# Patient Record
Sex: Female | Born: 1940 | Race: Black or African American | Hispanic: No | Smoking: Former smoker
Health system: Southern US, Community
[De-identification: ages and names within clinical notes are randomized; demographics above are authoritative.]

## PROBLEM LIST (undated history)

## (undated) DIAGNOSIS — M1990 Unspecified osteoarthritis, unspecified site: Secondary | ICD-10-CM

## (undated) DIAGNOSIS — I10 Essential (primary) hypertension: Secondary | ICD-10-CM

## (undated) DIAGNOSIS — K219 Gastro-esophageal reflux disease without esophagitis: Secondary | ICD-10-CM

## (undated) DIAGNOSIS — E079 Disorder of thyroid, unspecified: Secondary | ICD-10-CM

## (undated) DIAGNOSIS — I491 Atrial premature depolarization: Secondary | ICD-10-CM

## (undated) DIAGNOSIS — G4730 Sleep apnea, unspecified: Secondary | ICD-10-CM

## (undated) DIAGNOSIS — T4145XA Adverse effect of unspecified anesthetic, initial encounter: Secondary | ICD-10-CM

## (undated) DIAGNOSIS — T8859XA Other complications of anesthesia, initial encounter: Secondary | ICD-10-CM

## (undated) DIAGNOSIS — I209 Angina pectoris, unspecified: Secondary | ICD-10-CM

## (undated) DIAGNOSIS — M5416 Radiculopathy, lumbar region: Secondary | ICD-10-CM

## (undated) DIAGNOSIS — I493 Ventricular premature depolarization: Secondary | ICD-10-CM

## (undated) DIAGNOSIS — M48061 Spinal stenosis, lumbar region without neurogenic claudication: Secondary | ICD-10-CM

## (undated) DIAGNOSIS — E039 Hypothyroidism, unspecified: Secondary | ICD-10-CM

## (undated) DIAGNOSIS — M4800 Spinal stenosis, site unspecified: Secondary | ICD-10-CM

## (undated) HISTORY — PX: BACK SURGERY: SHX140

## (undated) HISTORY — PX: CHOLECYSTECTOMY: SHX55

## (undated) HISTORY — PX: JOINT REPLACEMENT: SHX530

## (undated) HISTORY — PX: ABDOMINAL HYSTERECTOMY: SHX81

## (undated) SURGERY — Surgical Case
Anesthesia: *Unknown

---

## 1995-04-29 DIAGNOSIS — N859 Noninflammatory disorder of uterus, unspecified: Secondary | ICD-10-CM | POA: Insufficient documentation

## 2004-08-23 ENCOUNTER — Ambulatory Visit: Payer: Self-pay

## 2005-07-05 ENCOUNTER — Ambulatory Visit: Payer: Self-pay | Admitting: Gastroenterology

## 2005-07-18 ENCOUNTER — Ambulatory Visit: Payer: Self-pay

## 2006-06-21 ENCOUNTER — Emergency Department: Payer: Self-pay | Admitting: Emergency Medicine

## 2006-08-10 ENCOUNTER — Emergency Department: Payer: Self-pay | Admitting: Emergency Medicine

## 2006-08-10 ENCOUNTER — Other Ambulatory Visit: Payer: Self-pay

## 2006-08-21 ENCOUNTER — Ambulatory Visit: Payer: Self-pay | Admitting: Internal Medicine

## 2006-09-09 ENCOUNTER — Ambulatory Visit: Payer: Self-pay | Admitting: Internal Medicine

## 2007-03-20 ENCOUNTER — Ambulatory Visit: Payer: Self-pay | Admitting: Internal Medicine

## 2007-09-10 ENCOUNTER — Ambulatory Visit: Payer: Self-pay | Admitting: Internal Medicine

## 2007-11-03 ENCOUNTER — Ambulatory Visit: Payer: Self-pay | Admitting: Internal Medicine

## 2007-11-30 ENCOUNTER — Ambulatory Visit: Payer: Self-pay | Admitting: Family Medicine

## 2007-12-15 ENCOUNTER — Emergency Department: Payer: Self-pay | Admitting: Emergency Medicine

## 2008-11-07 ENCOUNTER — Ambulatory Visit: Payer: Self-pay | Admitting: Internal Medicine

## 2009-02-16 ENCOUNTER — Ambulatory Visit: Payer: Self-pay | Admitting: Internal Medicine

## 2009-07-17 ENCOUNTER — Ambulatory Visit: Payer: Self-pay | Admitting: Internal Medicine

## 2009-11-07 ENCOUNTER — Ambulatory Visit: Payer: Self-pay | Admitting: Internal Medicine

## 2009-11-08 ENCOUNTER — Ambulatory Visit: Payer: Self-pay | Admitting: Internal Medicine

## 2010-07-17 ENCOUNTER — Ambulatory Visit: Payer: Self-pay | Admitting: Gastroenterology

## 2010-07-25 ENCOUNTER — Ambulatory Visit: Payer: Self-pay | Admitting: Internal Medicine

## 2010-09-04 ENCOUNTER — Encounter: Payer: Self-pay | Admitting: Internal Medicine

## 2010-09-16 ENCOUNTER — Encounter: Payer: Self-pay | Admitting: Internal Medicine

## 2010-10-23 ENCOUNTER — Ambulatory Visit: Payer: Self-pay | Admitting: Gastroenterology

## 2010-11-19 ENCOUNTER — Ambulatory Visit: Payer: Self-pay | Admitting: Internal Medicine

## 2010-11-20 ENCOUNTER — Ambulatory Visit: Payer: Self-pay | Admitting: Gastroenterology

## 2010-11-22 ENCOUNTER — Ambulatory Visit: Payer: Self-pay | Admitting: Internal Medicine

## 2011-11-21 ENCOUNTER — Ambulatory Visit: Payer: Self-pay | Admitting: Internal Medicine

## 2012-08-24 ENCOUNTER — Ambulatory Visit: Payer: Self-pay | Admitting: Internal Medicine

## 2012-11-24 ENCOUNTER — Ambulatory Visit: Payer: Self-pay | Admitting: Internal Medicine

## 2013-01-08 ENCOUNTER — Emergency Department: Payer: Self-pay | Admitting: Emergency Medicine

## 2013-03-30 ENCOUNTER — Emergency Department: Payer: Self-pay | Admitting: Emergency Medicine

## 2013-03-30 LAB — BASIC METABOLIC PANEL
Anion Gap: 5 — ABNORMAL LOW (ref 7–16)
BUN: 17 mg/dL (ref 7–18)
Chloride: 107 mmol/L (ref 98–107)
Co2: 29 mmol/L (ref 21–32)
Creatinine: 0.77 mg/dL (ref 0.60–1.30)
EGFR (Non-African Amer.): >60
Glucose: 96 mg/dL (ref 65–99)
Osmolality: 283 (ref 275–301)
Potassium: 3.5 mmol/L (ref 3.5–5.1)
Sodium: 141 mmol/L (ref 136–145)

## 2013-03-30 LAB — CBC
HGB: 12.7 g/dL (ref 12.0–16.0)
MCH: 33 pg (ref 26.0–34.0)
MCHC: 34.2 g/dL (ref 32.0–36.0)
RBC: 3.85 10*6/uL (ref 3.80–5.20)

## 2013-03-30 LAB — TROPONIN I
Troponin-I: <0.02 ng/mL
Troponin-I: <0.02 ng/mL

## 2013-09-12 ENCOUNTER — Emergency Department: Payer: Self-pay | Admitting: Emergency Medicine

## 2013-09-12 LAB — CBC WITH DIFFERENTIAL/PLATELET
BASOS PCT: 1.3 %
Basophil #: 0.1 10*3/uL (ref 0.0–0.1)
Eosinophil #: 0.1 10*3/uL (ref 0.0–0.7)
Eosinophil %: 1.6 %
HCT: 37.7 % (ref 35.0–47.0)
HGB: 12.7 g/dL (ref 12.0–16.0)
Lymphocyte #: 2.6 10*3/uL (ref 1.0–3.6)
Lymphocyte %: 36.5 %
MCH: 33.2 pg (ref 26.0–34.0)
MCHC: 33.7 g/dL (ref 32.0–36.0)
MCV: 99 fL (ref 80–100)
MONO ABS: 0.4 x10 3/mm (ref 0.2–0.9)
Monocyte %: 6.4 %
NEUTROS ABS: 3.8 10*3/uL (ref 1.4–6.5)
Neutrophil %: 54.2 %
PLATELETS: 156 10*3/uL (ref 150–440)
RBC: 3.82 10*6/uL (ref 3.80–5.20)
RDW: 14.4 % (ref 11.5–14.5)
WBC: 7 10*3/uL (ref 3.6–11.0)

## 2013-09-12 LAB — BASIC METABOLIC PANEL
Anion Gap: 3 — ABNORMAL LOW (ref 7–16)
BUN: 20 mg/dL — ABNORMAL HIGH (ref 7–18)
CHLORIDE: 108 mmol/L — AB (ref 98–107)
Calcium, Total: 8.6 mg/dL (ref 8.5–10.1)
Co2: 28 mmol/L (ref 21–32)
Creatinine: 0.7 mg/dL (ref 0.60–1.30)
EGFR (African American): >60
EGFR (Non-African Amer.): >60
Glucose: 93 mg/dL (ref 65–99)
Osmolality: 280 (ref 275–301)
Potassium: 3.8 mmol/L (ref 3.5–5.1)
Sodium: 139 mmol/L (ref 136–145)

## 2013-09-12 LAB — TROPONIN I

## 2013-09-12 LAB — URINALYSIS, COMPLETE
BACTERIA: NONE SEEN
BLOOD: NEGATIVE
Bilirubin,UR: NEGATIVE
Glucose,UR: NEGATIVE mg/dL (ref 0–75)
Ketone: NEGATIVE
Nitrite: NEGATIVE
Ph: 8 (ref 4.5–8.0)
Protein: NEGATIVE
RBC,UR: 1 /HPF (ref 0–5)
Specific Gravity: 1.008 (ref 1.003–1.030)
Squamous Epithelial: 1
WBC UR: <1 /HPF (ref 0–5)

## 2013-10-20 ENCOUNTER — Emergency Department: Payer: Self-pay | Admitting: Emergency Medicine

## 2013-10-20 LAB — URINALYSIS, COMPLETE
BILIRUBIN, UR: NEGATIVE
Bacteria: NONE SEEN
Blood: NEGATIVE
Glucose,UR: NEGATIVE mg/dL (ref 0–75)
Ketone: NEGATIVE
LEUKOCYTE ESTERASE: NEGATIVE
Nitrite: NEGATIVE
Ph: 8 (ref 4.5–8.0)
Protein: NEGATIVE
SQUAMOUS EPITHELIAL: NONE SEEN
Specific Gravity: 1.015 (ref 1.003–1.030)
WBC UR: 1 /HPF (ref 0–5)

## 2013-10-20 LAB — CBC WITH DIFFERENTIAL/PLATELET
Basophil #: 0 10*3/uL (ref 0.0–0.1)
Basophil %: 0.8 %
EOS PCT: 2.1 %
Eosinophil #: 0.1 10*3/uL (ref 0.0–0.7)
HCT: 36.8 % (ref 35.0–47.0)
HGB: 12.2 g/dL (ref 12.0–16.0)
Lymphocyte #: 1.9 10*3/uL (ref 1.0–3.6)
Lymphocyte %: 33.1 %
MCH: 32.7 pg (ref 26.0–34.0)
MCHC: 33.2 g/dL (ref 32.0–36.0)
MCV: 98 fL (ref 80–100)
MONO ABS: 0.5 x10 3/mm (ref 0.2–0.9)
Monocyte %: 8.4 %
NEUTROS PCT: 55.6 %
Neutrophil #: 3.1 10*3/uL (ref 1.4–6.5)
Platelet: 148 10*3/uL — ABNORMAL LOW (ref 150–440)
RBC: 3.75 10*6/uL — ABNORMAL LOW (ref 3.80–5.20)
RDW: 14.2 % (ref 11.5–14.5)
WBC: 5.6 10*3/uL (ref 3.6–11.0)

## 2013-10-20 LAB — BASIC METABOLIC PANEL
Anion Gap: 3 — ABNORMAL LOW (ref 7–16)
BUN: 13 mg/dL (ref 7–18)
CO2: 30 mmol/L (ref 21–32)
Calcium, Total: 9 mg/dL (ref 8.5–10.1)
Chloride: 107 mmol/L (ref 98–107)
Creatinine: 0.69 mg/dL (ref 0.60–1.30)
Glucose: 97 mg/dL (ref 65–99)
Osmolality: 279 (ref 275–301)
POTASSIUM: 3.8 mmol/L (ref 3.5–5.1)
Sodium: 140 mmol/L (ref 136–145)

## 2013-10-20 LAB — TROPONIN I

## 2013-11-04 DIAGNOSIS — M1990 Unspecified osteoarthritis, unspecified site: Secondary | ICD-10-CM | POA: Insufficient documentation

## 2014-01-03 ENCOUNTER — Ambulatory Visit: Payer: Self-pay | Admitting: Internal Medicine

## 2014-01-15 ENCOUNTER — Emergency Department: Payer: Self-pay | Admitting: Emergency Medicine

## 2014-01-18 ENCOUNTER — Emergency Department: Payer: Self-pay | Admitting: Emergency Medicine

## 2014-01-19 LAB — CBC
HCT: 38.9 % (ref 35.0–47.0)
HGB: 12.9 g/dL (ref 12.0–16.0)
MCH: 32.6 pg (ref 26.0–34.0)
MCHC: 33.1 g/dL (ref 32.0–36.0)
MCV: 98 fL (ref 80–100)
PLATELETS: 143 10*3/uL — AB (ref 150–440)
RBC: 3.95 10*6/uL (ref 3.80–5.20)
RDW: 13.9 % (ref 11.5–14.5)
WBC: 6.4 10*3/uL (ref 3.6–11.0)

## 2014-01-19 LAB — COMPREHENSIVE METABOLIC PANEL
ALBUMIN: 3.6 g/dL (ref 3.4–5.0)
ALK PHOS: 62 U/L
ALT: 25 U/L
Anion Gap: 8 (ref 7–16)
BILIRUBIN TOTAL: 0.3 mg/dL (ref 0.2–1.0)
BUN: 12 mg/dL (ref 7–18)
CREATININE: 0.61 mg/dL (ref 0.60–1.30)
Calcium, Total: 8.6 mg/dL (ref 8.5–10.1)
Chloride: 104 mmol/L (ref 98–107)
Co2: 29 mmol/L (ref 21–32)
Glucose: 116 mg/dL — ABNORMAL HIGH (ref 65–99)
OSMOLALITY: 282 (ref 275–301)
POTASSIUM: 3.4 mmol/L — AB (ref 3.5–5.1)
SGOT(AST): 16 U/L (ref 15–37)
Sodium: 141 mmol/L (ref 136–145)
Total Protein: 7.3 g/dL (ref 6.4–8.2)

## 2014-01-19 LAB — URINALYSIS, COMPLETE
BLOOD: NEGATIVE
Bacteria: NONE SEEN
Bilirubin,UR: NEGATIVE
Glucose,UR: NEGATIVE mg/dL (ref 0–75)
Ketone: NEGATIVE
Leukocyte Esterase: NEGATIVE
NITRITE: NEGATIVE
Ph: 8 (ref 4.5–8.0)
Protein: NEGATIVE
RBC,UR: 1 /HPF (ref 0–5)
Specific Gravity: 1.01 (ref 1.003–1.030)
Squamous Epithelial: <1
WBC UR: 1 /HPF (ref 0–5)

## 2014-01-19 LAB — TROPONIN I: Troponin-I: <0.02 ng/mL

## 2014-01-29 ENCOUNTER — Observation Stay: Payer: Self-pay | Admitting: Internal Medicine

## 2014-01-29 LAB — CBC
HCT: 39.5 % (ref 35.0–47.0)
HGB: 12.8 g/dL (ref 12.0–16.0)
MCH: 31.8 pg (ref 26.0–34.0)
MCHC: 32.4 g/dL (ref 32.0–36.0)
MCV: 98 fL (ref 80–100)
PLATELETS: 183 10*3/uL (ref 150–440)
RBC: 4.03 10*6/uL (ref 3.80–5.20)
RDW: 13.8 % (ref 11.5–14.5)
WBC: 8.6 10*3/uL (ref 3.6–11.0)

## 2014-01-29 LAB — BASIC METABOLIC PANEL
Anion Gap: 11 (ref 7–16)
BUN: 13 mg/dL (ref 7–18)
CO2: 27 mmol/L (ref 21–32)
CREATININE: 0.75 mg/dL (ref 0.60–1.30)
Calcium, Total: 8.8 mg/dL (ref 8.5–10.1)
Chloride: 106 mmol/L (ref 98–107)
EGFR (Non-African Amer.): >60
Glucose: 83 mg/dL (ref 65–99)
Osmolality: 286 (ref 275–301)
POTASSIUM: 3.2 mmol/L — AB (ref 3.5–5.1)
Sodium: 144 mmol/L (ref 136–145)

## 2014-01-29 LAB — URINALYSIS, COMPLETE
BLOOD: NEGATIVE
Bilirubin,UR: NEGATIVE
GLUCOSE, UR: NEGATIVE mg/dL (ref 0–75)
Nitrite: NEGATIVE
PH: 6 (ref 4.5–8.0)
Protein: NEGATIVE
RBC,UR: 15 /HPF (ref 0–5)
Specific Gravity: 1.023 (ref 1.003–1.030)
Squamous Epithelial: 7
WBC UR: 19 /HPF (ref 0–5)

## 2014-01-30 LAB — BASIC METABOLIC PANEL
Anion Gap: 9 (ref 7–16)
BUN: 22 mg/dL — ABNORMAL HIGH (ref 7–18)
Calcium, Total: 8.8 mg/dL (ref 8.5–10.1)
Chloride: 107 mmol/L (ref 98–107)
Co2: 26 mmol/L (ref 21–32)
Creatinine: 0.73 mg/dL (ref 0.60–1.30)
EGFR (African American): >60
EGFR (Non-African Amer.): >60
Glucose: 122 mg/dL — ABNORMAL HIGH (ref 65–99)
Osmolality: 288 (ref 275–301)
Potassium: 3.9 mmol/L (ref 3.5–5.1)
Sodium: 142 mmol/L (ref 136–145)

## 2014-01-30 LAB — CBC WITH DIFFERENTIAL/PLATELET
BASOS ABS: 0.1 10*3/uL (ref 0.0–0.1)
Basophil %: 0.8 %
Eosinophil #: 0 10*3/uL (ref 0.0–0.7)
Eosinophil %: 0.4 %
HCT: 39.7 % (ref 35.0–47.0)
HGB: 13.2 g/dL (ref 12.0–16.0)
Lymphocyte #: 1.3 10*3/uL (ref 1.0–3.6)
Lymphocyte %: 17.8 %
MCH: 32.8 pg (ref 26.0–34.0)
MCHC: 33.4 g/dL (ref 32.0–36.0)
MCV: 98 fL (ref 80–100)
MONO ABS: 0.3 x10 3/mm (ref 0.2–0.9)
Monocyte %: 4 %
Neutrophil #: 5.6 10*3/uL (ref 1.4–6.5)
Neutrophil %: 77 %
PLATELETS: 179 10*3/uL (ref 150–440)
RBC: 4.04 10*6/uL (ref 3.80–5.20)
RDW: 14 % (ref 11.5–14.5)
WBC: 7.2 10*3/uL (ref 3.6–11.0)

## 2014-01-31 LAB — BASIC METABOLIC PANEL
Anion Gap: 9 (ref 7–16)
BUN: 15 mg/dL (ref 7–18)
CALCIUM: 8.6 mg/dL (ref 8.5–10.1)
CHLORIDE: 104 mmol/L (ref 98–107)
CREATININE: 0.65 mg/dL (ref 0.60–1.30)
Co2: 26 mmol/L (ref 21–32)
EGFR (African American): >60
Glucose: 114 mg/dL — ABNORMAL HIGH (ref 65–99)
OSMOLALITY: 279 (ref 275–301)
Potassium: 3.9 mmol/L (ref 3.5–5.1)
Sodium: 139 mmol/L (ref 136–145)

## 2014-02-01 LAB — BASIC METABOLIC PANEL
ANION GAP: 9 (ref 7–16)
BUN: 15 mg/dL (ref 7–18)
CALCIUM: 9 mg/dL (ref 8.5–10.1)
Chloride: 101 mmol/L (ref 98–107)
Co2: 26 mmol/L (ref 21–32)
Creatinine: 0.71 mg/dL (ref 0.60–1.30)
EGFR (African American): >60
EGFR (Non-African Amer.): >60
Glucose: 120 mg/dL — ABNORMAL HIGH (ref 65–99)
Osmolality: 274 (ref 275–301)
Potassium: 3.6 mmol/L (ref 3.5–5.1)
SODIUM: 136 mmol/L (ref 136–145)

## 2014-02-01 LAB — CBC WITH DIFFERENTIAL/PLATELET
Basophil #: 0 10*3/uL (ref 0.0–0.1)
Basophil %: 0.3 %
Eosinophil #: 0 10*3/uL (ref 0.0–0.7)
Eosinophil %: 0.5 %
HCT: 42.3 % (ref 35.0–47.0)
HGB: 14 g/dL (ref 12.0–16.0)
Lymphocyte #: 1.8 10*3/uL (ref 1.0–3.6)
Lymphocyte %: 19.5 %
MCH: 32.5 pg (ref 26.0–34.0)
MCHC: 33.2 g/dL (ref 32.0–36.0)
MCV: 98 fL (ref 80–100)
MONOS PCT: 6.8 %
Monocyte #: 0.6 x10 3/mm (ref 0.2–0.9)
NEUTROS ABS: 6.6 10*3/uL — AB (ref 1.4–6.5)
NEUTROS PCT: 72.9 %
Platelet: 207 10*3/uL (ref 150–440)
RBC: 4.32 10*6/uL (ref 3.80–5.20)
RDW: 13.9 % (ref 11.5–14.5)
WBC: 9 10*3/uL (ref 3.6–11.0)

## 2014-02-02 LAB — CBC WITH DIFFERENTIAL/PLATELET
BASOS ABS: 0.1 10*3/uL (ref 0.0–0.1)
BASOS PCT: 0.7 %
Eosinophil #: 0.1 10*3/uL (ref 0.0–0.7)
Eosinophil %: 1.1 %
HCT: 40.9 % (ref 35.0–47.0)
HGB: 13.8 g/dL (ref 12.0–16.0)
Lymphocyte #: 2.2 10*3/uL (ref 1.0–3.6)
Lymphocyte %: 27.1 %
MCH: 32.8 pg (ref 26.0–34.0)
MCHC: 33.7 g/dL (ref 32.0–36.0)
MCV: 98 fL (ref 80–100)
Monocyte #: 0.7 x10 3/mm (ref 0.2–0.9)
Monocyte %: 8.3 %
NEUTROS PCT: 62.8 %
Neutrophil #: 5.2 10*3/uL (ref 1.4–6.5)
Platelet: 201 10*3/uL (ref 150–440)
RBC: 4.19 10*6/uL (ref 3.80–5.20)
RDW: 13.9 % (ref 11.5–14.5)
WBC: 8.3 10*3/uL (ref 3.6–11.0)

## 2014-02-02 LAB — BASIC METABOLIC PANEL
Anion Gap: 12 (ref 7–16)
BUN: 19 mg/dL — ABNORMAL HIGH (ref 7–18)
CO2: 28 mmol/L (ref 21–32)
Calcium, Total: 9 mg/dL (ref 8.5–10.1)
Chloride: 100 mmol/L (ref 98–107)
Creatinine: 0.86 mg/dL (ref 0.60–1.30)
EGFR (African American): >60
EGFR (Non-African Amer.): >60
Glucose: 103 mg/dL — ABNORMAL HIGH (ref 65–99)
Osmolality: 282 (ref 275–301)
Potassium: 3.3 mmol/L — ABNORMAL LOW (ref 3.5–5.1)
SODIUM: 140 mmol/L (ref 136–145)

## 2014-02-02 LAB — PATHOLOGY REPORT

## 2014-03-16 ENCOUNTER — Ambulatory Visit: Payer: Self-pay | Admitting: Family Medicine

## 2014-10-08 NOTE — Op Note (Signed)
PATIENT NAME:  Erika Crawford, Erika Crawford MR#:  [id number] DATE OF BIRTH:  15-May-1941  DATE OF PROCEDURE:  02/01/2014  PREOPERATIVE DIAGNOSIS: L2 compression fracture.   POSTOPERATIVE DIAGNOSIS: L2 compression fracture.   PROCEDURE: L2 compression fracture.   ANESTHESIA: MAC.   SURGEON: Hessie Knows, M.D.   DESCRIPTION OF PROCEDURE: The patient was brought to the Operating Room and after sedation was given, the patient was placed prone, and C-arm was brought in and good visualization of the L2 compression fracture was obtained in both AP and lateral projections. After patient identification and timeout procedures were completed, the area of the planned incisions on both right and left side were cleansed with alcohol and 5 mL of 1% Xylocaine infiltrated bilaterally. After this was completed, the back was prepped and draped in the usual sterile fashion and a repeat timeout procedure carried out. The right side was approached first through a small stab incision after first infiltrating down to the bone with a 50-50 mix of 1% Xylocaine and 0.5% Sensorcaine with epinephrine. A trocar was advanced to an extrapedicular approach and entered the vertebral body. A vertebral body biopsy was obtained of the L2 fracture. Next, the drill was carried out and this got to the midline and a balloon was inflated to approximately 4.5 mL. Following this, cement was mixed and inserted after the balloon was removed from the [facility] set. It was slowly used to fill the vertebral body getting good fill of the superior endplate, and there was a very good correction of the compression deformity of the superior endplate. Approximately 5.5 mL of bone cement was infiltrated without extravasation. The trocar was removed and postprocedure x-rays were obtained in both AP and lateral projections with the C-arm. The wound was closed with Dermabond and a Band-Aid applied. The patient was sent to the recovery room in stable condition.   ESTIMATED  BLOOD LOSS: Minimal.   COMPLICATIONS: None.   SPECIMEN: L2 vertebral body.   CONDITION: To recovery room, stable.    ____________________________ Laurene Footman, MD mjm:at D: 02/01/2014 20:48:25 ET T: 02/01/2014 21:56:49 ET JOB#: [id number]  cc: Laurene Footman, MD, <Dictator> Laurene Footman MD ELECTRONICALLY SIGNED 02/02/2014 9:57

## 2014-10-08 NOTE — H&P (Signed)
PATIENT NAME:  Erika Crawford, Erika Crawford MR#:  [id number] DATE OF BIRTH:  03/24/1941  DATE OF ADMISSION:  01/29/2014  PRIMARY CARE PROVIDER: Ellamae Sia, MD.   EMERGENCY DEPARTMENT REFERRING DOCTOR: Yetta Numbers. Karma Greaser, MD.   CHIEF COMPLAINT: Fall, back pain.   HISTORY OF PRESENT ILLNESS: The patient is a 74 year old African American female with history of hypothyroidism, hypertension, GERD, who has some mild chronic back pain who reports that she has been feeling dizzy for the past few months. Last week she felt dizzy and may have "passed out" and fell on her back. Since then, she has had significant back pain and has had difficulty with walking. The patient comes to the ED here and was evaluated with x-ray of her lumbar spine which shows a moderate compression fracture of the L2. The patient otherwise denies any fevers, chills. No chest pains. No palpitations. Complains of occasional dyspnea on exertion. Denies any urinary frequency, urgency, or hesitancy.   PAST MEDICAL HISTORY: Significant for:  1.  GERD.  2.  Hypothyroidism.  3.  Hypertension.   PAST SURGICAL HISTORY: Status post cholecystectomy, hysterectomy.   ALLERGIES: None.   CURRENT MEDICATIONS: She is on valsartan 160 daily, Tylenol 1000 mg t.i.d. p.r.n., Nexium 40 daily, Nasonex 50 mcg 2-3 sprays each nostril daily as needed, meloxicam  7.5 mg 1 tab p.o. b.i.d., levothyroxine 100 mcg daily, Flexeril t.i.d. as needed.   SOCIAL HISTORY: Does not smoke. Drinks occasionally and lives by herself.   FAMILY HISTORY: Positive for hypertension.   REVIEW OF SYSTEMS:  CONSTITUTIONAL: Denies any fevers. Complains of back pain. No weight loss. No weight gain.  EYES: No blurred or double vision. No pain. No redness. No inflammation. Has glaucoma and cataracts.  ENT: No tinnitus. No ear pain. No hearing loss. Has seasonal allergies. No epistaxis. No nasal discharge. No difficulty swallowing.   RESPIRATORY: Denies any cough, wheezing,  hemoptysis. No COPD, no TB.  CARDIOVASCULAR: Denies any chest pain, orthopnea, edema, or arrhythmia. No palpitations. No syncope.  GASTROINTESTINAL: No nausea, vomiting, diarrhea. No abdominal pain. No hematemesis. No melena. Has GERD.  GENITOURINARY: Denies any dysuria, hematuria, renal calculus, or frequency.  ENDOCRINE: Denies any polyuria or nocturia, has hypothyroidism.  HEMATOLOGY AND LYMPHATICS: Denies anemia, easy bruisability, or bleeding.  SKIN: No acne. No rash.  MUSCULOSKELETAL: Complains of back pain.  NEUROLOGIC: No numbness, CVA, TIA, or seizures.  PSYCHIATRIC: No anxiety, insomnia, or ADD.   PHYSICAL EXAMINATION: VITAL SIGNS: Temperature 97.1, pulse 86, respirations 18, blood pressure 170/78.  GENERAL: The patient is an obese female, currently not in any distress.  HEENT: Head atraumatic, normocephalic. Pupils equally round, reactive to light and accommodation. There is no conjunctival pallor. No scleral icterus. Extraocular movements intact. Nasal exam shows no nasal lesions or drainage. Ears, there are no external lesions or drainage. Mouth is moist without any exudate.  NECK: Supple and symmetric. No masses. Thyroid midline. Not enlarged. No JVD.  RESPIRATORY: Good respiratory effort. Clear to auscultation bilaterally without any rales, rhonchi, wheezing.  CARDIOVASCULAR: Regular rate and rhythm. No murmurs, rubs, clicks, or gallops. PMI is not displaced.  ABDOMEN: Soft, nontender, nondistended. No hepatosplenomegaly.  GENITOURINARY: Deferred.  MUSCULOSKELETAL: There is no erythema or swelling. Tenderness in the lumbar spine region.  SKIN: No rash.  LYMPHATICS: No lymph nodes palpable.  VASCULAR: Good DP, PT pulses.  NEUROLOGICAL: Cranial nerves II through XII grossly intact. No focal deficits.  PSYCHIATRIC: Not anxious or depressed.   EVALUATIONS: Thoracic spine shows no acute abnormality.  Lumbar spine shows a moderate compression fracture of L2, which appears to be  recent. Glucose 83, BUN 13, creatinine 0.75, sodium 144, potassium 3.2, chloride 106, CO2 of 27, calcium 8.8. WBC 8.6, hemoglobin 12.8, platelet count 183,000.   ASSESSMENT AND PLAN: The patient is a 74 year old African American female who comes in with severe back pain.  1.  Severe back pain due to a lumbar compression fracture. Will get an MRI of the back, lumbar spine to further evaluate. The patient may benefit from intervention. Will get orthopedic evaluation, start her on a Medrol pack, and pain control.  2.  Accelerated hypertension, likely due to pain. We will continue valsartan. Will add p.r.n. hydralazine.  3.  Hypothyroidism. Continue levothyroxine.  4.  Hypokalemia. We will give her a dose of potassium. Repeat potassium in the morning.  5.  Gastroesophageal reflux disease. We will continue PPI.  6.  Miscellaneous. We will place the patient on sequential compression devices for deep vein thrombosis prophylaxis due to patient may need intervention for compression fracture.    TIME SPENT: 55 minutes on this patient.    ____________________________ Erika Crawford. Posey Pronto, MD shp:at D: 01/29/2014 14:34:38 ET T: 01/29/2014 15:42:54 ET JOB#: [id number]  cc: Derius Ghosh H. Posey Pronto, MD, <Dictator> Alric Seton MD ELECTRONICALLY SIGNED 02/01/2014 14:32

## 2014-10-08 NOTE — Discharge Summary (Signed)
PATIENT NAME:  Erika Crawford, Erika Crawford MR#:  [id number] DATE OF BIRTH:  May 10, 1941  DATE OF ADMISSION:  01/29/2014 DATE OF DISCHARGE:  02/02/2014  ADMITTING DIAGNOSIS: L2 compression fracture after a fall.  ASSOCIATED DIAGNOSES: 1. Osteoporosis.  2. Vertigo.  3. Hypertension.  4. Urinary tract infection.  5. Hypothyroidism.  6. Gastroesophageal reflux disease.   CONSULTATION: Dr. Hessie Knows, orthopedics.   PROCEDURES: L2 kyphoplasty performed August 18 without complication.   BRIEF HISTORY OF PRESENT ILLNESS: This very pleasant 74 year old, African American woman, with history of hypothyroidism, hypertension, gastroesophageal reflux disease and osteoporosis, who has chronic back pain, reports that she has been feeling dizzy for the past few months. Last week she felt dizzy and fell backwards. Since then, she has had significant back pain with difficulty walking. She presented to the Emergency Department with back pain unmanageable at home. X-ray showed a moderate compression fracture of the L2 vertebrae.    HOSPITAL COURSE AND TREATMENT:  1. L2 compression fracture: The patient was seen by orthopedics in hospital and had L2 kyphoplasty on August 18 with significant pain relief. She received a 5-day course of methylprednisolone orally, which was tapered and will stop upon discharge. Pain was controlled with morphine, Tylenol, and Norco. After kyphoplasty, she has significantly diminished pain and is discharged on Tylenol and ibuprofen for pain. This should be monitored and adjusted as needed at the skilled nursing facility. 2. Osteoporosis: The patient has a history of osteoporosis and reports being on a bisphosphonate in the distant past. With a compression fracture, she should resume taking a bisphosphonate and is started on Fosamax 70 mg once weekly. She will also need to start taking calcium and vitamin D regularly. She will need to follow up with her primary care provider for further treatment  of osteoporosis. 3. Vertigo: The patient reports that she has had difficulty with vertigo for the past several months. She has been taking meclizine with some improvement in the hospital. When pain allows, she should be treated with vestibular maneuvers. She is being discharged to a skilled nursing facility for assistance with balance and gait training. This will be important to prevent future falls. 4. Hypertension: Blood pressure was elevated during this hospitalization on her home regimen of Valsartan only. Hydrochlorothiazide was started with good effect. She is being discharged on both of these medications.  5. Urinary tract infection: Urine on admission showed significant white cell count. She was treated with 3 days of IV Rocephin. At no point did the patient complain of dysuria, nausea, fevers, chills, or any other symptoms associated. Unfortunately, urine was not sent for culture.  6. Hypothyroidism: The patient will continue on her home dose of levothyroxine.  7. Gastroesophageal reflux disease: She was treated with Protonix in hospital and she will be discharged on her home PPI, omeprazole.   DISCHARGE PHYSICAL EXAMINATION:  VITAL SIGNS: Day of discharge, temperature 98.3, pulse 81, respirations 20, blood pressure 128/93, oxygenation 95% on room air.  GENERAL: The patient is alert, oriented, in no acute distress, resting comfortably in bed.  RESPIRATORY: Lungs are clear to auscultation bilaterally with good air movement.  CARDIOVASCULAR: Regular rate and rhythm, no murmurs, rubs, or gallops, no peripheral edema. Peripheral pulses are 2+.  ABDOMEN: Bowel sounds are present abdomen is soft, nontender, nondistended. No guarding, no rebound.  MUSCULOSKELETAL: The patient is able to roll in the bed with minimal pain. Strength in all extremities is 5 out of 5, sensation is intact.  NEUROLOGIC: Cranial nerves are intact. Motor  and sensory function is intact.  PSYCHIATRIC: The patient is calm.  Affect is appropriate, no signs of uncontrolled depression or anxiety.   LABORATORIES: Day of discharge, sodium 140, potassium 3.3 (this has been repleted this morning), chloride 100, bicarbonate 28, BUN 19, creatinine 0.86, glucose 103, hemoglobin 13.8, white blood cell count 8.3, platelets 201,000, MCV is 98.   CONDITION ON DISCHARGE: The patient is discharged in stable condition to go to the skilled nursing facility for continued physical and occupational therapy.   DISCHARGE MEDICATIONS: 1. Valsartan 160 mg 1 tablet daily.  2. Levothyroxine 100 mcg 1 tablet daily.  3. Nexium 1 tablet daily.  4. Nasonex 50 mcg/inhalation nasal spray 2 to 3 sprays in each nostril once a day as needed.  5. Flexeril 3 times a day as needed.  6. Acetaminophen 325 two tablets every 4 hours as needed for pain.  7. Ibuprofen 600 mg 1 tablet every 8 hours as needed for pain.  8. Meclizine 25 mg 1 tablet every 8 hours as needed for dizziness.  9. Diclofenac 1% topical gel to be applied to affected area 3 times a day as needed for pain.  10. Hydrochlorothiazide 25 mg 1 tablet once a day.  11. Calcium and vitamin D 600 mg/800 units 1 tablet twice a day.  12. Fosamax 70 mg 1 tablet once a week.   DIET: No restrictions.   ACTIVITY: Per physical therapy.  FOLLOW-UP: The patient should follow up with orthopedics, Dr. Rudene Christians, in 2 to 3 weeks after discharge.   time spent on discharge 40 minutes  ____________________________ Earleen Newport. Volanda Napoleon, MD cpw:JT D: 02/02/2014 07:57:38 ET T: 02/02/2014 08:23:26 ET JOB#: [id number]  cc: Earleen Newport. Volanda Napoleon, MD, <Dictator> Aldean Jewett MD ELECTRONICALLY SIGNED 02/02/2014 9:57

## 2014-10-08 NOTE — Consult Note (Signed)
Brief Consult Note: Diagnosis: severe back pain, L 2 fracture.   Patient was seen by consultant.   Recommend further assessment or treatment.   Comments: agree with MRI.  Patient unable to care for herself at present.  If compresion fracture is new, will discuss kyphoplasty with patient.  Electronic Signatures: Laurene Footman (MD)  (Signed 15-Aug-15 15:58)  Authored: Brief Consult Note   Last Updated: 15-Aug-15 15:58 by Laurene Footman (MD)

## 2014-10-08 NOTE — Consult Note (Signed)
PATIENT NAME:  Erika Crawford, Erika Crawford MR#:  [id number] DATE OF BIRTH:  Feb 22, 1941  DATE OF CONSULTATION:  01/31/2014  CONSULTING PHYSICIAN:  Laurene Footman, MD  REASON FOR CONSULTATION: L2 fracture.   HISTORY OF PRESENT ILLNESS: The patient is a 74 year old who fell several days prior to her admission. She really is unable to care for herself secondary to her pain. She had an episode  where she felt dizzy, fell, had severe pain, and was admitted because of this. She lives alone. She has had underlying back pain and had x-rays that showed an L2 compression fracture. She had an MRI ordered yesterday, but did not tolerate it. I ordered a bone scan and that today shows L2 compression fracture that is acute. There are also degenerative changes at L4-L5. I discussed these findings with the patient.  She is tender to percussion at L2. She does not have a neurologic deficit. Risk, benefits, and possible complications were discussed.   CLINICAL IMPRESSION: L2 compression fracture.   PLAN: Kyphoplasty tomorrow, to allow her to regain mobility. She may require short rehabilitation stay, but expect her to return home within the next 2 weeks if kyphoplasty works as expected. Booklet and brochure were reviewed with the patient and she appears on to understand the procedure.  Orders have been entered for surgery tomorrow.     ____________________________ Laurene Footman, MD mjm:LT D: 01/31/2014 15:57:53 ET T: 01/31/2014 16:17:10 ET JOB#: [id number]  cc: Laurene Footman, MD, <Dictator> Laurene Footman MD ELECTRONICALLY SIGNED 01/31/2014 18:38

## 2014-10-12 ENCOUNTER — Emergency Department
Admit: 2014-10-12 | Disposition: A | Discharge status: Home / Self Care | Payer: Self-pay | Admitting: Emergency Medicine

## 2014-10-12 ENCOUNTER — Other Ambulatory Visit: Payer: Self-pay | Admitting: Family Medicine

## 2014-10-12 LAB — BASIC METABOLIC PANEL
Anion Gap: 8 (ref 7–16)
BUN: 14 mg/dL
CO2: 26 mmol/L
CREATININE: 0.64 mg/dL
Calcium, Total: 8.9 mg/dL
Chloride: 108 mmol/L
EGFR (African American): >60
Glucose: 148 mg/dL — ABNORMAL HIGH
Potassium: 3.6 mmol/L
Sodium: 142 mmol/L

## 2014-10-12 LAB — TROPONIN I

## 2014-10-12 LAB — CBC
HCT: 37.4 % (ref 35.0–47.0)
HGB: 12.4 g/dL (ref 12.0–16.0)
MCH: 32.7 pg (ref 26.0–34.0)
MCHC: 33.1 g/dL (ref 32.0–36.0)
MCV: 99 fL (ref 80–100)
PLATELETS: 161 10*3/uL (ref 150–440)
RBC: 3.79 10*6/uL — ABNORMAL LOW (ref 3.80–5.20)
RDW: 13.9 % (ref 11.5–14.5)
WBC: 6 10*3/uL (ref 3.6–11.0)

## 2014-10-13 ENCOUNTER — Other Ambulatory Visit: Payer: Self-pay | Admitting: Family Medicine

## 2014-10-13 DIAGNOSIS — Z1231 Encounter for screening mammogram for malignant neoplasm of breast: Secondary | ICD-10-CM

## 2014-11-01 ENCOUNTER — Other Ambulatory Visit: Payer: Self-pay | Admitting: Orthopedic Surgery

## 2014-11-01 ENCOUNTER — Other Ambulatory Visit: Payer: Self-pay

## 2014-11-01 ENCOUNTER — Emergency Department: Payer: Medicare Other

## 2014-11-01 ENCOUNTER — Emergency Department
Admission: EM | Admit: 2014-11-01 | Discharge: 2014-11-01 | Discharge status: Against Medical Advice | Payer: Medicare Other | Attending: Obstetrics and Gynecology | Admitting: Obstetrics and Gynecology

## 2014-11-01 DIAGNOSIS — M778 Other enthesopathies, not elsewhere classified: Secondary | ICD-10-CM

## 2014-11-01 DIAGNOSIS — R079 Chest pain, unspecified: Secondary | ICD-10-CM | POA: Diagnosis present

## 2014-11-01 DIAGNOSIS — R110 Nausea: Secondary | ICD-10-CM | POA: Diagnosis not present

## 2014-11-01 DIAGNOSIS — R0602 Shortness of breath: Secondary | ICD-10-CM | POA: Diagnosis not present

## 2014-11-01 DIAGNOSIS — M7581 Other shoulder lesions, right shoulder: Principal | ICD-10-CM

## 2014-11-01 LAB — CBC
HCT: 37.1 % (ref 35.0–47.0)
Hemoglobin: 12.2 g/dL (ref 12.0–16.0)
MCH: 32.7 pg (ref 26.0–34.0)
MCHC: 32.9 g/dL (ref 32.0–36.0)
MCV: 99.2 fL (ref 80.0–100.0)
Platelets: 172 10*3/uL (ref 150–440)
RBC: 3.74 MIL/uL — ABNORMAL LOW (ref 3.80–5.20)
RDW: 13.9 % (ref 11.5–14.5)
WBC: 5.7 10*3/uL (ref 3.6–11.0)

## 2014-11-01 LAB — BASIC METABOLIC PANEL
ANION GAP: 8 (ref 5–15)
BUN: 17 mg/dL (ref 6–20)
CO2: 27 mmol/L (ref 22–32)
Calcium: 8.8 mg/dL — ABNORMAL LOW (ref 8.9–10.3)
Chloride: 107 mmol/L (ref 101–111)
Creatinine, Ser: 0.77 mg/dL (ref 0.44–1.00)
GFR calc Af Amer: >60 mL/min (ref >60)
GFR calc non Af Amer: >60 mL/min (ref >60)
GLUCOSE: 140 mg/dL — AB (ref 65–99)
POTASSIUM: 3.5 mmol/L (ref 3.5–5.1)
SODIUM: 142 mmol/L (ref 135–145)

## 2014-11-01 LAB — TROPONIN I

## 2014-11-01 NOTE — ED Notes (Signed)
Pt in with co chest pain x 1 hr, denies any heart disease, does have some shob, also has some nausea.

## 2014-11-02 ENCOUNTER — Telehealth: Payer: Self-pay | Admitting: Emergency Medicine

## 2014-11-09 ENCOUNTER — Ambulatory Visit: Payer: Medicare Other

## 2014-12-26 ENCOUNTER — Ambulatory Visit: Payer: Medicare Other | Admitting: Pain Medicine

## 2015-01-02 ENCOUNTER — Ambulatory Visit: Payer: Medicare Other | Admitting: Pain Medicine

## 2015-01-05 ENCOUNTER — Ambulatory Visit: Payer: Self-pay

## 2015-04-23 ENCOUNTER — Emergency Department
Admission: EM | Admit: 2015-04-23 | Discharge: 2015-04-23 | Disposition: A | Discharge status: Home / Self Care | Payer: Medicare Other | Attending: Emergency Medicine | Admitting: Emergency Medicine

## 2015-04-23 ENCOUNTER — Encounter: Payer: Self-pay | Admitting: Emergency Medicine

## 2015-04-23 DIAGNOSIS — R110 Nausea: Secondary | ICD-10-CM | POA: Diagnosis not present

## 2015-04-23 DIAGNOSIS — I10 Essential (primary) hypertension: Secondary | ICD-10-CM | POA: Diagnosis not present

## 2015-04-23 DIAGNOSIS — N390 Urinary tract infection, site not specified: Secondary | ICD-10-CM | POA: Insufficient documentation

## 2015-04-23 DIAGNOSIS — M545 Low back pain: Secondary | ICD-10-CM | POA: Diagnosis present

## 2015-04-23 HISTORY — DX: Disorder of thyroid, unspecified: E07.9

## 2015-04-23 HISTORY — DX: Essential (primary) hypertension: I10

## 2015-04-23 LAB — URINALYSIS COMPLETE WITH MICROSCOPIC (ARMC ONLY)
Bilirubin Urine: NEGATIVE
Glucose, UA: NEGATIVE mg/dL
Hgb urine dipstick: NEGATIVE
KETONES UR: NEGATIVE mg/dL
Nitrite: NEGATIVE
PROTEIN: NEGATIVE mg/dL
Specific Gravity, Urine: 1.024 (ref 1.005–1.030)
pH: 5 (ref 5.0–8.0)

## 2015-04-23 MED ORDER — CEPHALEXIN 500 MG PO CAPS
500.0000 mg | ORAL_CAPSULE | Freq: Once | ORAL | Status: AC
  Administered 2015-04-23: 500 mg via ORAL
  Filled 2015-04-23: qty 1

## 2015-04-23 MED ORDER — CEPHALEXIN 500 MG PO CAPS: 500.0000 mg | ORAL_CAPSULE | Freq: Two times a day (BID) | ORAL | Status: AC

## 2015-04-23 NOTE — Discharge Instructions (Signed)
Asymptomatic Bacteriuria, Female Asymptomatic bacteriuria is the presence of a large number of bacteria in your urine without the usual symptoms of burning or frequent urination. The following conditions increase the risk of asymptomatic bacteriuria:  Diabetes mellitus.  Advanced age.  Pregnancy in the first trimester.  Kidney stones.  Kidney transplants.  Leaky kidney tube valve in young children (reflux). Treatment for this condition is not needed in most people and can lead to other problems such as too much yeast and growth of resistant bacteria. However, some people, such as pregnant women, do need treatment to prevent kidney infection. Asymptomatic bacteriuria in pregnancy is also associated with fetal growth restriction, premature labor, and newborn death. HOME CARE INSTRUCTIONS Monitor your condition for any changes. The following actions may help to relieve any discomfort you are feeling:  Drink enough water and fluids to keep your urine clear or pale yellow. Go to the bathroom more often to keep your bladder empty.  Keep the area around your vagina and rectum clean. Wipe yourself from front to back after urinating. SEEK IMMEDIATE MEDICAL CARE IF:  You develop signs of an infection such as:  Burning with urination.  Frequency of voiding.  Back pain.  Fever.  You have blood in the urine.  You develop a fever. MAKE SURE YOU:  Understand these instructions.  Will watch your condition.  Will get help right away if you are not doing well or get worse.   This information is not intended to replace advice given to you by your health care provider. Make sure you discuss any questions you have with your health care provider.   Document Released: 06/03/2005 Document Revised: 06/24/2014 Document Reviewed: 11/23/2012 Elsevier Interactive Patient Education 2016 [place] the prescription antibiotic until completely gone. Follow-up with your provider after  treatment to confirm cure.

## 2015-04-23 NOTE — ED Notes (Signed)
Pt presents with back pain since Thursday; pt states she had back surgery a year ago. Radiates to left shoulder and mid back.

## 2015-04-23 NOTE — ED Provider Notes (Signed)
[facility] Emergency Department Provider Note ____________________________________________  Time seen: 1930  I have reviewed the triage vital signs and the nursing notes.  HISTORY  Chief Complaint  Back Pain  HPI Erika Crawford is a 74 y.o. female reports to the ED with onset of back pain on Thursday. She describes the back pain as crampy and notes that it is slightly worse on the right than the left. She notes referral across the lower back and up the flanks towards the ribs. She also noted that today she began to feel somewhat heel and nauseated without vomiting. She denies any specific injury or trauma to the back, and is also without any bladder or bowel incontinence. She denies lower extremity referral, foot drop, or distal paresthesias. She has been without any significant back pain over the last several months. She does give a remote history of a balloon kyphoplasty in August 2015 by Dr. Rudene Christians secondary to a fall.Since that time any discomfort is managed with daily dosing of diclofenac, gabapentin, and extra strength Tylenol. The patient is also without any significant dysuria, frequency, or urgency related to her bladder habits. She rates her discomfort at a 7/10 in triage.  Past Medical History  Diagnosis Date  . Hypertension   . Thyroid disease     There are no active problems to display for this patient.   Past Surgical History  Procedure Laterality Date  . Back surgery    . Abdominal hysterectomy     Current Outpatient Rx  Name  Route  Sig  Dispense  Refill  . cephALEXin (KEFLEX) 500 MG capsule   Oral   Take 1 capsule (500 mg total) by mouth 2 (two) times daily.   13 capsule   0    Allergies Review of patient's allergies indicates no known allergies.  History reviewed. No pertinent family history.  Social History Social History  Substance Use Topics  . Smoking status: Never Smoker   . Smokeless tobacco: None  . Alcohol Use: Yes      Comment: very little    Review of Systems  Constitutional: Negative for fever. Eyes: Negative for visual changes. ENT: Negative for sore throat. Cardiovascular: Negative for chest pain. Respiratory: Negative for shortness of breath. Gastrointestinal: Negative for abdominal pain, vomiting and diarrhea. Genitourinary: Negative for dysuria. Musculoskeletal: Positive for back pain. Skin: Negative for rash. Neurological: Negative for headaches, focal weakness or numbness. ____________________________________________  PHYSICAL EXAM:  VITAL SIGNS: ED Triage Vitals  Enc Vitals Group     BP 04/23/15 1801 140/69 mmHg     Pulse Rate 04/23/15 1801 88     Resp 04/23/15 1801 18     Temp 04/23/15 1801 98.4 F (36.9 C)     Temp Source 04/23/15 1801 Oral     SpO2 04/23/15 1801 97 %     Weight 04/23/15 1801 225 lb (102.059 kg)     Height 04/23/15 1801 5\' 7"  (1.702 m)     Head Cir --      Peak Flow --      Pain Score 04/23/15 1803 7     Pain Loc --      Pain Edu? --      Excl. in [place]? --    Constitutional: Alert and oriented. Well appearing and in no distress. Head: Normocephalic and atraumatic.      Eyes: Conjunctivae are normal. PERRL. Normal extraocular movements      Ears: Canals clear. TMs intact bilaterally.   Nose:  No congestion/rhinorrhea.   Mouth/Throat: Mucous membranes are moist.   Neck: Supple. No thyromegaly. Hematological/Lymphatic/Immunological: No cervical lymphadenopathy. Cardiovascular: Normal rate, regular rhythm.  Respiratory: Normal respiratory effort. No wheezes/rales/rhonchi. Gastrointestinal: Soft and nontender. No distention. Musculoskeletal: Normal spinal alignment without midline tenderness, spasm, deformity, or step-off. Patient is tender to palp along the bilateral lumbar sacral junction, and the bilateral flanks. She is able to demonstrate normal sit to stand transition without difficulty. Nontender with normal range of motion in all extremities.   Neurologic:  Cranial nerves II through XII grossly intact. Normal LE DTRs bilaterally. Normal toe dorsiflexion and foot eversion on exam. Normal gait without ataxia. Normal speech and language. No gross focal neurologic deficits are appreciated. Skin:  Skin is warm, dry and intact. No rash noted. Psychiatric: Mood and affect are normal. Patient exhibits appropriate insight and judgment. ____________________________________________   LABS (pertinent positives/negatives) Labs Reviewed  URINALYSIS COMPLETEWITH MICROSCOPIC ([facility] ONLY) - Abnormal; Notable for the following:    Color, Urine YELLOW (*)    APPearance HAZY (*)    Leukocytes, UA 3+ (*)    Bacteria, UA RARE (*)    Squamous Epithelial / LPF 0-5 (*)    All other components within normal limits  URINE CULTURE  ____________________________________________  PROCEDURES  Keflex 500 mg PO ____________________________________________  INITIAL IMPRESSION / ASSESSMENT AND PLAN / ED COURSE  Patient with an asymptomatic bacteriuria on presentation. Patient will be discharged home with prescriptions for Keflex to dose as directed. She is encouraged to increase fluid intake and empty bladder on schedule. She is also encouraged to follow with the primary care provider after treatment to confirm cure. Urine cultures pending at this time. ____________________________________________  FINAL CLINICAL IMPRESSION(S) / ED DIAGNOSES  Final diagnoses:  UTI (lower urinary tract infection)      Melvenia Needles, PA-C 04/23/15 [place], MD 04/25/15 [phone]

## 2015-04-25 ENCOUNTER — Ambulatory Visit: Payer: Medicare Other | Admitting: Pain Medicine

## 2015-04-25 LAB — URINE CULTURE: Special Requests: NORMAL

## 2015-07-11 ENCOUNTER — Other Ambulatory Visit: Payer: Self-pay | Admitting: Internal Medicine

## 2015-07-11 DIAGNOSIS — Z1231 Encounter for screening mammogram for malignant neoplasm of breast: Secondary | ICD-10-CM

## 2015-07-13 DIAGNOSIS — G4733 Obstructive sleep apnea (adult) (pediatric): Secondary | ICD-10-CM | POA: Insufficient documentation

## 2015-07-13 DIAGNOSIS — Z9989 Dependence on other enabling machines and devices: Secondary | ICD-10-CM

## 2015-07-19 ENCOUNTER — Other Ambulatory Visit: Payer: Self-pay | Admitting: Internal Medicine

## 2015-07-19 ENCOUNTER — Ambulatory Visit
Admission: RE | Admit: 2015-07-19 | Discharge: 2015-07-19 | Disposition: A | Discharge status: Home / Self Care | Payer: Medicare Other | Source: Ambulatory Visit | Attending: Internal Medicine | Admitting: Internal Medicine

## 2015-07-19 DIAGNOSIS — Z1231 Encounter for screening mammogram for malignant neoplasm of breast: Secondary | ICD-10-CM

## 2015-08-14 ENCOUNTER — Encounter: Admission: RE | Payer: Self-pay | Source: Ambulatory Visit

## 2015-08-14 ENCOUNTER — Ambulatory Visit: Admission: RE | Admit: 2015-08-14 | Payer: Medicare Other | Source: Ambulatory Visit | Admitting: Gastroenterology

## 2015-08-14 SURGERY — COLONOSCOPY WITH PROPOFOL
Anesthesia: General

## 2015-09-20 ENCOUNTER — Encounter: Payer: Self-pay | Admitting: *Deleted

## 2015-09-21 ENCOUNTER — Encounter
Admission: RE | Disposition: A | Discharge status: Home / Self Care | Payer: Self-pay | Source: Ambulatory Visit | Attending: Gastroenterology

## 2015-09-21 ENCOUNTER — Encounter: Payer: Self-pay | Admitting: Anesthesiology

## 2015-09-21 ENCOUNTER — Ambulatory Visit
Admission: RE | Admit: 2015-09-21 | Discharge: 2015-09-21 | Disposition: A | Discharge status: Home / Self Care | Payer: Medicare Other | Source: Ambulatory Visit | Attending: Gastroenterology | Admitting: Gastroenterology

## 2015-09-21 ENCOUNTER — Ambulatory Visit: Payer: Medicare Other | Admitting: Anesthesiology

## 2015-09-21 DIAGNOSIS — I10 Essential (primary) hypertension: Secondary | ICD-10-CM | POA: Insufficient documentation

## 2015-09-21 DIAGNOSIS — Z9889 Other specified postprocedural states: Secondary | ICD-10-CM | POA: Diagnosis not present

## 2015-09-21 DIAGNOSIS — Z86010 Personal history of colonic polyps: Secondary | ICD-10-CM | POA: Insufficient documentation

## 2015-09-21 DIAGNOSIS — Z79899 Other long term (current) drug therapy: Secondary | ICD-10-CM | POA: Diagnosis not present

## 2015-09-21 DIAGNOSIS — Z90710 Acquired absence of both cervix and uterus: Secondary | ICD-10-CM | POA: Insufficient documentation

## 2015-09-21 DIAGNOSIS — E079 Disorder of thyroid, unspecified: Secondary | ICD-10-CM | POA: Diagnosis not present

## 2015-09-21 DIAGNOSIS — Z7951 Long term (current) use of inhaled steroids: Secondary | ICD-10-CM | POA: Diagnosis not present

## 2015-09-21 DIAGNOSIS — K5900 Constipation, unspecified: Secondary | ICD-10-CM | POA: Insufficient documentation

## 2015-09-21 DIAGNOSIS — Z791 Long term (current) use of non-steroidal anti-inflammatories (NSAID): Secondary | ICD-10-CM | POA: Diagnosis not present

## 2015-09-21 HISTORY — PX: COLONOSCOPY WITH PROPOFOL: SHX5780

## 2015-09-21 SURGERY — COLONOSCOPY WITH PROPOFOL
Anesthesia: General

## 2015-09-21 MED ORDER — SODIUM CHLORIDE 0.9 % IV SOLN
INTRAVENOUS | Status: DC
  Administered 2015-09-21: 1000 mL via INTRAVENOUS
  Administered 2015-09-21: 10:00:00 via INTRAVENOUS

## 2015-09-21 MED ORDER — PROPOFOL 500 MG/50ML IV EMUL
INTRAVENOUS | Status: DC | PRN
  Administered 2015-09-21: 100 ug/kg/min via INTRAVENOUS

## 2015-09-21 MED ORDER — SODIUM CHLORIDE 0.9 % IV SOLN: INTRAVENOUS | Status: DC

## 2015-09-21 MED ORDER — PROPOFOL 10 MG/ML IV BOLUS
INTRAVENOUS | Status: DC | PRN
  Administered 2015-09-21: 80 mg via INTRAVENOUS

## 2015-09-21 MED ORDER — FENTANYL CITRATE (PF) 100 MCG/2ML IJ SOLN
INTRAMUSCULAR | Status: DC | PRN
  Administered 2015-09-21: 50 ug via INTRAVENOUS

## 2015-09-21 MED ORDER — LIDOCAINE HCL (CARDIAC) 20 MG/ML IV SOLN
INTRAVENOUS | Status: DC | PRN
  Administered 2015-09-21: 100 mg via INTRAVENOUS

## 2015-09-21 NOTE — Anesthesia Postprocedure Evaluation (Signed)
Anesthesia Post Note  Patient: Erika Crawford  Procedure(s) Performed: Procedure(s) (LRB): COLONOSCOPY WITH PROPOFOL (N/A)  Patient location during evaluation: Endoscopy Anesthesia Type: General Level of consciousness: awake and alert Pain management: pain level controlled Vital Signs Assessment: post-procedure vital signs reviewed and stable Respiratory status: spontaneous breathing, nonlabored ventilation, respiratory function stable and patient connected to nasal cannula oxygen Cardiovascular status: blood pressure returned to baseline and stable Postop Assessment: no signs of nausea or vomiting Anesthetic complications: no    Last Vitals:  Filed Vitals:   09/21/15 1019 09/21/15 1020  BP: 138/82 138/82  Pulse: 84   Temp: 36.1 C   Resp: 23     Last Pain: There were no vitals filed for this visit.               Martha Clan

## 2015-09-21 NOTE — Op Note (Signed)
[facility] Patient Name: Erika Crawford Procedure Date: 09/21/2015 9:58 AM MRN: [id number] Account #: [id number] Date of Birth: [birth year] Admit Type: Outpatient Age: 75 Room: [facility] ENDO ROOM 4 Gender: Female Note Status: Finalized Procedure:            Colonoscopy Indications:          Personal history of colonic polyps, Constipation Providers:            Lupita Dawn. Candace Cruise, MD Referring MD:         Ellin Goodie, MD (Referring MD) Medicines:            Monitored Anesthesia Care Complications:        No immediate complications. Procedure:            Pre-Anesthesia Assessment:                       - Prior to the procedure, a History and Physical was                        performed, and patient medications, allergies and                        sensitivities were reviewed. The patient's tolerance of                        previous anesthesia was reviewed.                       - The risks and benefits of the procedure and the                        sedation options and risks were discussed with the                        patient. All questions were answered and informed                        consent was obtained.                       - After reviewing the risks and benefits, the patient                        was deemed in satisfactory condition to undergo the                        procedure.                       After obtaining informed consent, the colonoscope was                        passed under direct vision. Throughout the procedure,                        the patient's blood pressure, pulse, and oxygen                        saturations were monitored continuously. The  Colonoscope was introduced through the anus and                        advanced to the the cecum, identified by appendiceal                        orifice and ileocecal valve. The colonoscopy was                        performed without difficulty. The  patient tolerated the                        procedure well. The quality of the bowel preparation                        was good. Findings:      The colon (entire examined portion) appeared normal. Impression:           - The entire examined colon is normal.                       - No specimens collected. Recommendation:       - Discharge patient to home.                       - The findings and recommendations were discussed with                        the patient.                       - High fiber diet daily. Procedure Code(s):    --- Professional ---                       [phone], Colonoscopy, flexible; diagnostic, including                        collection of specimen(s) by brushing or washing, when                        performed (separate procedure) Diagnosis Code(s):    --- Professional ---                       Z86.010, Personal history of colonic polyps                       K59.00, Constipation, unspecified CPT copyright 2016 American Medical Association. All rights reserved. The codes documented in this report are preliminary and upon coder review may  be revised to meet current compliance requirements. Hulen Luster, MD 09/21/2015 10:14:42 AM This report has been signed electronically. Number of Addenda: 0 Note Initiated On: 09/21/2015 9:58 AM Scope Withdrawal Time: 0 hours 6 minutes 41 seconds  Total Procedure Duration: 0 hours 9 minutes 14 seconds       [facility]

## 2015-09-21 NOTE — Transfer of Care (Signed)
Immediate Anesthesia Transfer of Care Note  Patient: Erika Crawford  Procedure(s) Performed: Procedure(s): COLONOSCOPY WITH PROPOFOL (N/A)  Patient Location: PACU  Anesthesia Type:General  Level of Consciousness: awake, alert , oriented and patient cooperative  Airway & Oxygen Therapy: Patient Spontanous Breathing and Patient connected to nasal cannula oxygen  Post-op Assessment: Report given to RN and Post -op Vital signs reviewed and stable  Post vital signs: Reviewed and stable  Last Vitals:  Filed Vitals:   09/21/15 0657  BP: 137/79  Pulse: 84  Temp: 36.4 C  Resp: 16    Complications: No apparent anesthesia complications

## 2015-09-21 NOTE — H&P (Signed)
Primary Care Physician:  Ellamae Sia, MD Primary Gastroenterologist:  Dr. Candace Cruise  Pre-Procedure History & Physical: HPI:  Erika Crawford is a 75 y.o. female is here for an colonoscopy.  Past Medical History  Diagnosis Date  . Hypertension   . Thyroid disease     Past Surgical History  Procedure Laterality Date  . Back surgery    . Abdominal hysterectomy      Prior to Admission medications   Medication Sig Start Date End Date Taking? Authorizing Provider  acetaminophen (TYLENOL) 500 MG tablet Take 500 mg by mouth every 6 (six) hours as needed.   Yes Historical Provider, MD  alendronate (FOSAMAX) 70 MG tablet Take 70 mg by mouth once a week. Take with a full glass of water on an empty stomach.   Yes Historical Provider, MD  Calcium Carbonate-Vitamin D (CALTRATE 600+D) 600-400 MG-UNIT tablet Take 1 tablet by mouth 2 (two) times daily.   Yes Historical Provider, MD  diclofenac (VOLTAREN) 50 MG EC tablet Take 50 mg by mouth 2 (two) times daily.   Yes Historical Provider, MD  diclofenac sodium (VOLTAREN) 1 % GEL Apply topically 3 (three) times daily.   Yes Historical Provider, MD  gabapentin (NEURONTIN) 100 MG capsule Take 300 mg by mouth 3 (three) times daily.   Yes Historical Provider, MD  levothyroxine (SYNTHROID, LEVOTHROID) 100 MCG tablet Take 100 mcg by mouth daily before breakfast.   Yes Historical Provider, MD  mirtazapine (REMERON) 15 MG tablet Take 7.5 mg by mouth at bedtime.   Yes Historical Provider, MD  mometasone (NASONEX) 50 MCG/ACT nasal spray Place 2 sprays into the nose daily.   Yes Historical Provider, MD  Multiple Vitamin (MULTIVITAMIN) capsule Take 1 capsule by mouth daily.   Yes Historical Provider, MD  omeprazole (PRILOSEC) 20 MG capsule Take 20 mg by mouth daily.   Yes Historical Provider, MD  polyethylene glycol (MIRALAX / GLYCOLAX) packet Take 17 g by mouth daily.   Yes Historical Provider, MD  pravastatin (PRAVACHOL) 40 MG tablet Take 40 mg by mouth  daily.   Yes Historical Provider, MD  valsartan (DIOVAN) 320 MG tablet Take 320 mg by mouth daily.   Yes Historical Provider, MD    Allergies as of 09/04/2015  . (No Known Allergies)    Family History  Problem Relation Age of Onset  . Breast cancer Neg Hx     Social History   Social History  . Marital Status: Divorced    Spouse Name: N/A  . Number of Children: N/A  . Years of Education: N/A   Occupational History  . Not on file.   Social History Main Topics  . Smoking status: Never Smoker   . Smokeless tobacco: Not on file  . Alcohol Use: Yes     Comment: very little  . Drug Use: Not on file  . Sexual Activity: Not on file   Other Topics Concern  . Not on file   Social History Narrative    Review of Systems: See HPI, otherwise negative ROS  Physical Exam: BP 137/79 mmHg  Pulse 84  Temp(Src) 97.6 F (36.4 C)  Resp 16  Ht 5' 7.5" (1.715 m)  Wt 100.699 kg (222 lb)  BMI 34.24 kg/m2  SpO2 96% General:   Alert,  pleasant and cooperative in NAD Head:  Normocephalic and atraumatic. Neck:  Supple; no masses or thyromegaly. Lungs:  Clear throughout to auscultation.    Heart:  Regular rate and rhythm. Abdomen:  Soft, nontender and nondistended. Normal bowel sounds, without guarding, and without rebound.   Neurologic:  Alert and  oriented x4;  grossly normal neurologically.  Impression/Plan: Erika Crawford is here for an colonoscopy to be performed for personal hx of colon polyps, constipation  Risks, benefits, limitations, and alternatives regarding  colonoscopy have been reviewed with the patient.  Questions have been answered.  All parties agreeable.   Erika Crawford, Lupita Dawn, MD  09/21/2015, 8:07 AM

## 2015-09-21 NOTE — Anesthesia Preprocedure Evaluation (Signed)
Anesthesia Evaluation  Patient identified by MRN, date of birth, ID band Patient awake    Reviewed: Allergy & Precautions, H&P , NPO status , Patient's Chart, lab work & pertinent test results, reviewed documented beta blocker date and time   History of Anesthesia Complications Negative for: history of anesthetic complications  Airway Mallampati: I  TM Distance: >3 FB Neck ROM: full    Dental no notable dental hx. (+) Partial Upper, Missing   Pulmonary neg shortness of breath, sleep apnea and Continuous Positive Airway Pressure Ventilation , neg COPD, neg recent URI,    Pulmonary exam normal breath sounds clear to auscultation       Cardiovascular Exercise Tolerance: Good hypertension, On Medications (-) angina(-) CAD, (-) Past MI, (-) Cardiac Stents and (-) CABG Normal cardiovascular exam(-) dysrhythmias (-) Valvular Problems/Murmurs Rhythm:regular Rate:Normal     Neuro/Psych negative neurological ROS  negative psych ROS   GI/Hepatic negative GI ROS, Neg liver ROS,   Endo/Other  neg diabetesHypothyroidism   Renal/GU negative Renal ROS  negative genitourinary   Musculoskeletal   Abdominal   Peds  Hematology negative hematology ROS (+)   Anesthesia Other Findings Past Medical History:   Hypertension                                                 Thyroid disease                                              Reproductive/Obstetrics negative OB ROS                             Anesthesia Physical Anesthesia Plan  ASA: II  Anesthesia Plan: General   Post-op Pain Management:    Induction:   Airway Management Planned:   Additional Equipment:   Intra-op Plan:   Post-operative Plan:   Informed Consent: I have reviewed the patients History and Physical, chart, labs and discussed the procedure including the risks, benefits and alternatives for the proposed anesthesia with the patient  or authorized representative who has indicated his/her understanding and acceptance.   Dental Advisory Given  Plan Discussed with: Anesthesiologist, CRNA and Surgeon  Anesthesia Plan Comments:         Anesthesia Quick Evaluation

## 2015-09-26 ENCOUNTER — Encounter: Payer: Self-pay | Admitting: Gastroenterology

## 2015-10-18 ENCOUNTER — Other Ambulatory Visit: Payer: Self-pay | Admitting: Orthopedic Surgery

## 2015-10-18 DIAGNOSIS — M7581 Other shoulder lesions, right shoulder: Secondary | ICD-10-CM

## 2015-10-18 DIAGNOSIS — M778 Other enthesopathies, not elsewhere classified: Secondary | ICD-10-CM

## 2015-10-18 DIAGNOSIS — M19011 Primary osteoarthritis, right shoulder: Secondary | ICD-10-CM

## 2015-11-01 ENCOUNTER — Ambulatory Visit
Admission: RE | Admit: 2015-11-01 | Discharge: 2015-11-01 | Disposition: A | Discharge status: Home / Self Care | Payer: Medicare Other | Source: Ambulatory Visit | Attending: Orthopedic Surgery | Admitting: Orthopedic Surgery

## 2015-11-01 DIAGNOSIS — M7581 Other shoulder lesions, right shoulder: Secondary | ICD-10-CM | POA: Insufficient documentation

## 2015-11-01 DIAGNOSIS — M19011 Primary osteoarthritis, right shoulder: Secondary | ICD-10-CM | POA: Diagnosis present

## 2015-11-01 DIAGNOSIS — M778 Other enthesopathies, not elsewhere classified: Secondary | ICD-10-CM

## 2015-11-27 DIAGNOSIS — S46101A Unspecified injury of muscle, fascia and tendon of long head of biceps, right arm, initial encounter: Secondary | ICD-10-CM | POA: Insufficient documentation

## 2015-11-27 DIAGNOSIS — M7581 Other shoulder lesions, right shoulder: Secondary | ICD-10-CM | POA: Insufficient documentation

## 2015-11-27 DIAGNOSIS — M19011 Primary osteoarthritis, right shoulder: Secondary | ICD-10-CM | POA: Insufficient documentation

## 2015-11-27 DIAGNOSIS — M75111 Incomplete rotator cuff tear or rupture of right shoulder, not specified as traumatic: Secondary | ICD-10-CM | POA: Insufficient documentation

## 2016-02-03 ENCOUNTER — Encounter: Payer: Self-pay | Admitting: Emergency Medicine

## 2016-02-03 ENCOUNTER — Emergency Department
Admission: EM | Admit: 2016-02-03 | Discharge: 2016-02-03 | Disposition: A | Discharge status: Home / Self Care | Payer: Medicare Other | Attending: Emergency Medicine | Admitting: Emergency Medicine

## 2016-02-03 DIAGNOSIS — Z79899 Other long term (current) drug therapy: Secondary | ICD-10-CM | POA: Insufficient documentation

## 2016-02-03 DIAGNOSIS — R52 Pain, unspecified: Secondary | ICD-10-CM | POA: Diagnosis present

## 2016-02-03 DIAGNOSIS — M2550 Pain in unspecified joint: Secondary | ICD-10-CM

## 2016-02-03 DIAGNOSIS — I10 Essential (primary) hypertension: Secondary | ICD-10-CM | POA: Insufficient documentation

## 2016-02-03 MED ORDER — METHYLPREDNISOLONE 4 MG PO TBPK: ORAL_TABLET | ORAL | 0 refills | Status: DC

## 2016-02-03 MED ORDER — DEXAMETHASONE SODIUM PHOSPHATE 10 MG/ML IJ SOLN
10.0000 mg | Freq: Once | INTRAMUSCULAR | Status: AC
  Administered 2016-02-03: 10 mg via INTRAMUSCULAR
  Filled 2016-02-03: qty 1

## 2016-02-03 NOTE — ED Triage Notes (Signed)
Patient states has no pain at rest, only pain when "moves around"

## 2016-02-03 NOTE — ED Notes (Signed)
See triage note. Pt hx arthritis, scheduled for surgical consult to L knee and R shoulder this Monday. Pt has taken tylenol and naproxen PO and voltaren gel without relief.

## 2016-02-03 NOTE — ED Provider Notes (Signed)
[facility] Emergency Department Provider Note   ____________________________________________   First MD Initiated Contact with Patient 02/03/16 1456     (approximate)  I have reviewed the triage vital signs and the nursing notes.   HISTORY  Chief Complaint Generalized Body Aches    HPI Erika Crawford is a 75 y.o. female patient complaining of generalized body soreness 2 months. Patient state she is seeing her family practitioner for this complaint and has had extensive blood work with no significant findings. Patient states she's taken Tylenol, naproxen,and Voltaren gel without anyrelief.Patient states she has a long history arthritis and is pending a surgical consult in 2 days to discuss left knee and right shoulder surgery. Patient rates the pain as a 9/10. Patient describes pain as "achy". No other palliative measures for this complaint.   Past Medical History:  Diagnosis Date  . Hypertension   . Thyroid disease     There are no active problems to display for this patient.   Past Surgical History:  Procedure Laterality Date  . ABDOMINAL HYSTERECTOMY    . BACK SURGERY    . COLONOSCOPY WITH PROPOFOL N/A 09/21/2015   Procedure: COLONOSCOPY WITH PROPOFOL;  Surgeon: Hulen Luster, MD;  Location: [facility] ENDOSCOPY;  Service: Gastroenterology;  Laterality: N/A;    Prior to Admission medications   Medication Sig Start Date End Date Taking? Authorizing Provider  acetaminophen (TYLENOL) 500 MG tablet Take 500 mg by mouth every 6 (six) hours as needed.    Historical Provider, MD  alendronate (FOSAMAX) 70 MG tablet Take 70 mg by mouth once a week. Take with a full glass of water on an empty stomach.    Historical Provider, MD  Calcium Carbonate-Vitamin D (CALTRATE 600+D) 600-400 MG-UNIT tablet Take 1 tablet by mouth 2 (two) times daily.    Historical Provider, MD  diclofenac (VOLTAREN) 50 MG EC tablet Take 50 mg by mouth 2 (two) times daily.    Historical  Provider, MD  diclofenac sodium (VOLTAREN) 1 % GEL Apply topically 3 (three) times daily.    Historical Provider, MD  gabapentin (NEURONTIN) 100 MG capsule Take 300 mg by mouth 3 (three) times daily.    Historical Provider, MD  levothyroxine (SYNTHROID, LEVOTHROID) 100 MCG tablet Take 100 mcg by mouth daily before breakfast.    Historical Provider, MD  methylPREDNISolone (MEDROL DOSEPAK) 4 MG TBPK tablet Take Tapered dose as directed 02/03/16   Sable Feil, PA-C  mirtazapine (REMERON) 15 MG tablet Take 7.5 mg by mouth at bedtime.    Historical Provider, MD  mometasone (NASONEX) 50 MCG/ACT nasal spray Place 2 sprays into the nose daily.    Historical Provider, MD  Multiple Vitamin (MULTIVITAMIN) capsule Take 1 capsule by mouth daily.    Historical Provider, MD  omeprazole (PRILOSEC) 20 MG capsule Take 20 mg by mouth daily.    Historical Provider, MD  polyethylene glycol (MIRALAX / GLYCOLAX) packet Take 17 g by mouth daily.    Historical Provider, MD  pravastatin (PRAVACHOL) 40 MG tablet Take 40 mg by mouth daily.    Historical Provider, MD  valsartan (DIOVAN) 320 MG tablet Take 320 mg by mouth daily.    Historical Provider, MD    Allergies Celebrex [celecoxib]; Codeine; Etodolac; Hydrochlorothiazide; Oxycodone; Pravastatin; Relafen [nabumetone]; and Tramadol  Family History  Problem Relation Age of Onset  . Breast cancer Neg Hx     Social History Social History  Substance Use Topics  . Smoking status: Never Smoker  .  Smokeless tobacco: Not on file  . Alcohol use Yes     Comment: very little    Review of Systems Constitutional: No fever/chills. Denies body ache Eyes: No visual changes. ENT: No sore throat. Cardiovascular: Denies chest pain. Respiratory: Denies shortness of breath. Gastrointestinal: No abdominal pain.  No nausea, no vomiting.  No diarrhea.  No constipation. Genitourinary: Multi-joint arthritis Musculoskeletal: Negative for back pain. Skin: Negative for  rash. Neurological: Negative for headaches, focal weakness or numbness. Endocrine:Hypertension hypothyroidism Allergic/Immunilogical: See medication list ____________________________________________   PHYSICAL EXAM:  VITAL SIGNS: ED Triage Vitals  Enc Vitals Group     BP 02/03/16 1407 134/65     Pulse Rate 02/03/16 1407 79     Resp 02/03/16 1407 20     Temp 02/03/16 1407 98.5 F (36.9 C)     Temp Source 02/03/16 1407 Oral     SpO2 02/03/16 1407 97 %     Weight 02/03/16 1408 225 lb (102.1 kg)     Height 02/03/16 1408 5\' 7"  (1.702 m)     Head Circumference --      Peak Flow --      Pain Score 02/03/16 1409 9     Pain Loc --      Pain Edu? --      Excl. in [place]? --     Constitutional: Alert and oriented. Well appearing and in no acute distress. Eyes: Conjunctivae are normal. PERRL. EOMI. Head: Atraumatic. Nose: No congestion/rhinnorhea. Mouth/Throat: Mucous membranes are moist.  Oropharynx non-erythematous. Neck: No stridor.  No cervical spine tenderness to palpation. Hematological/Lymphatic/Immunilogical: No cervical lymphadenopathy. Cardiovascular: Normal rate, regular rhythm. Grossly normal heart sounds.  Good peripheral circulation. Respiratory: Normal respiratory effort.  No retractions. Lungs CTAB. Gastrointestinal: Soft and nontender. No distention. No abdominal bruits. No CVA tenderness. Musculoskeletal: No obvious deformity or peripheral or lower extremities. Patient has diffuse diffuse guarding of the upper and lower extremities and lumbar spine. Patient ambulates with supportive cane.  Neurologic:  Normal speech and language. No gross focal neurologic deficits are appreciated. No gait instability. Skin:  Skin is warm, dry and intact. No rash noted. Psychiatric: Mood and affect are normal. Speech and behavior are normal.  ____________________________________________   LABS (all labs ordered are listed, but only abnormal results are displayed)  Labs Reviewed - No  data to display ____________________________________________  EKG   ____________________________________________  RADIOLOGY   ____________________________________________   PROCEDURES  Procedure(s) performed: None  Procedures  Critical Care performed: No  ____________________________________________   INITIAL IMPRESSION / ASSESSMENT AND PLAN / ED COURSE  Pertinent labs & imaging results that were available during my care of the patient were reviewed by me and considered in my medical decision making (see chart for details).  Arthralgia secondary to arthritis. Patient given discharge Instructions. Patient given a prescription for Medrol Dosepak. Patient advised to discuss her diffuse arthralgia with her family doctor and keep her scheduled surgical appointment.  Clinical Course     ____________________________________________   FINAL CLINICAL IMPRESSION(S) / ED DIAGNOSES  Final diagnoses:  Arthralgia      NEW MEDICATIONS STARTED DURING THIS VISIT:  New Prescriptions   METHYLPREDNISOLONE (MEDROL DOSEPAK) 4 MG TBPK TABLET    Take Tapered dose as directed     Note:  This document was prepared using Dragon voice recognition software and may include unintentional dictation errors.    Sable Feil, PA-C 02/03/16 1517    Nance Pear, MD 02/03/16 [phone]

## 2016-02-03 NOTE — ED Triage Notes (Signed)
Generalized body soreness x 2 months. Denies fall or injury. States has had workup at [facility] without diagnosis.

## 2016-02-07 IMAGING — CT CT HEAD WITHOUT CONTRAST
1 series · 16 of 28 positions shown, 20 images · non-contrast
Comparison: None.

CLINICAL DATA: Dizziness.

EXAM:
CT HEAD WITHOUT CONTRAST
TECHNIQUE: Contiguous axial images were obtained from the base of the skull
through the vertex without intravenous contrast.

[Series 2: soft tissue · axial · 0.42mm/px · z∈[-122,+2]mm · 16 of 28 slices shown, 20 images]
[im 2/28  brain]
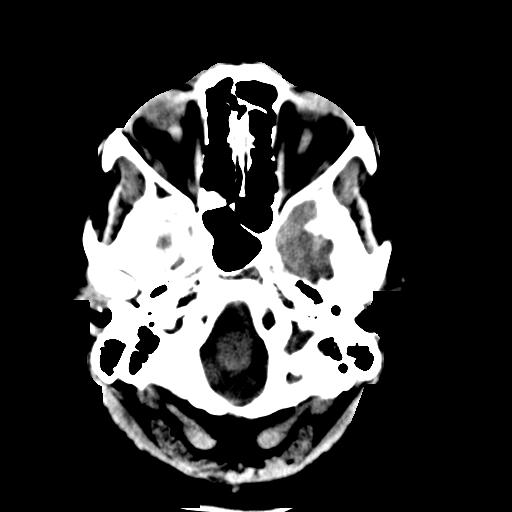
[im 2/28  bone]
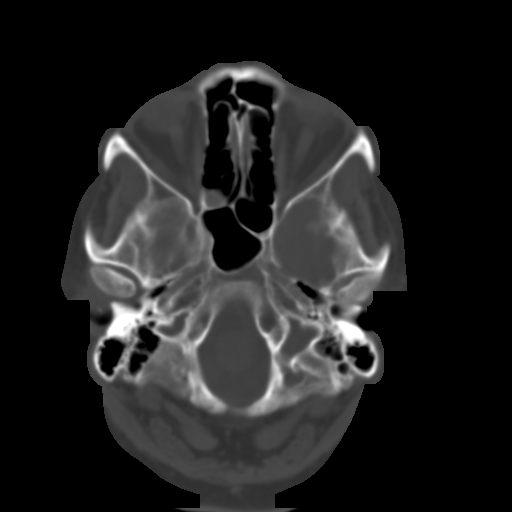
[im 4/28  brain]
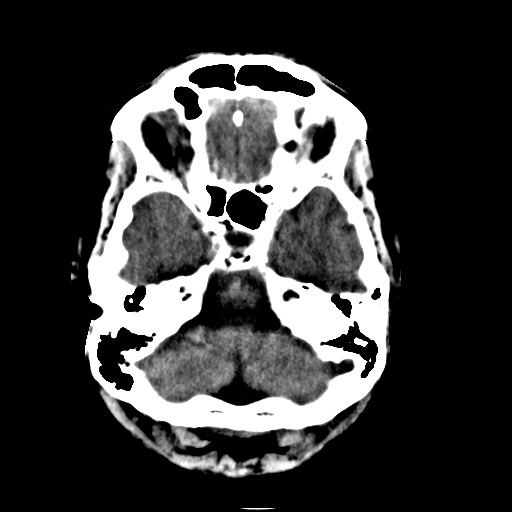
[im 6/28  brain]
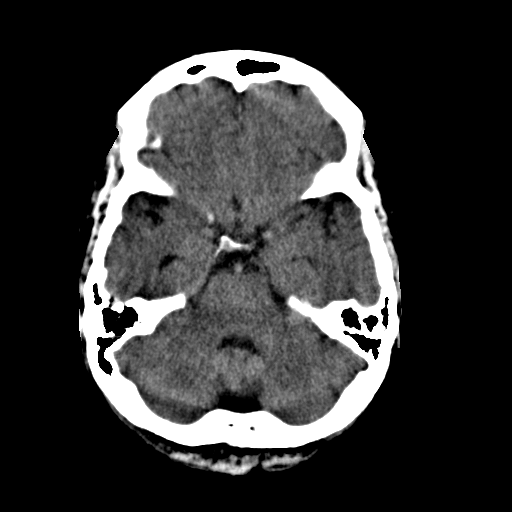
[im 7/28  brain]
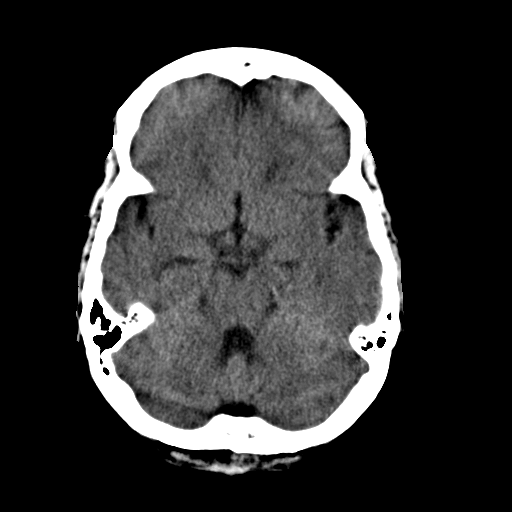
[im 9/28  brain]
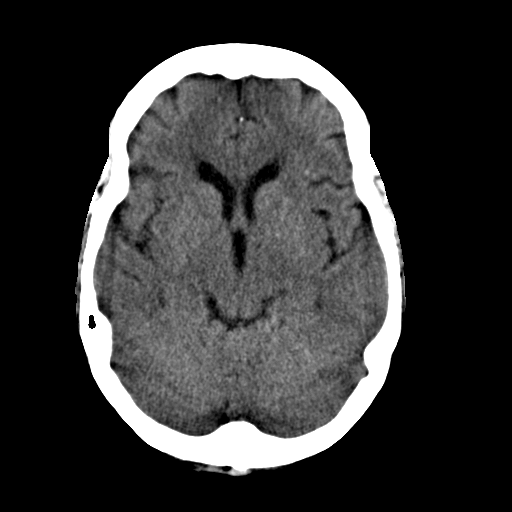
[im 9/28  bone]
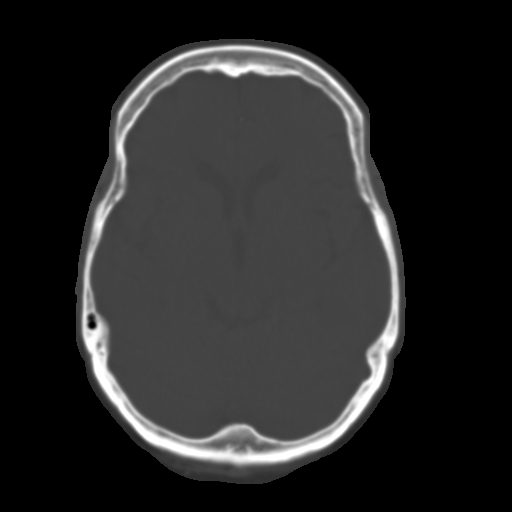
[im 10/28  brain]
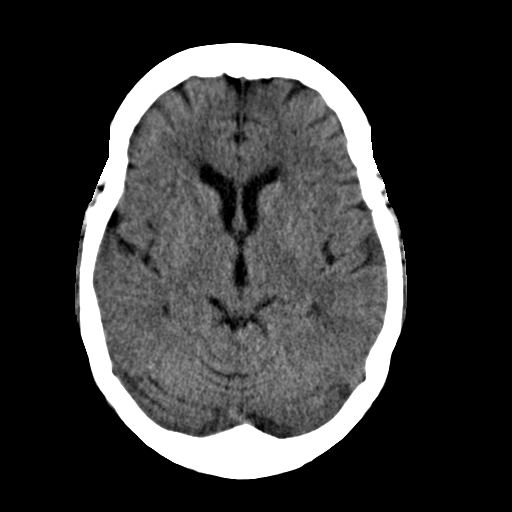
[im 12/28  brain]
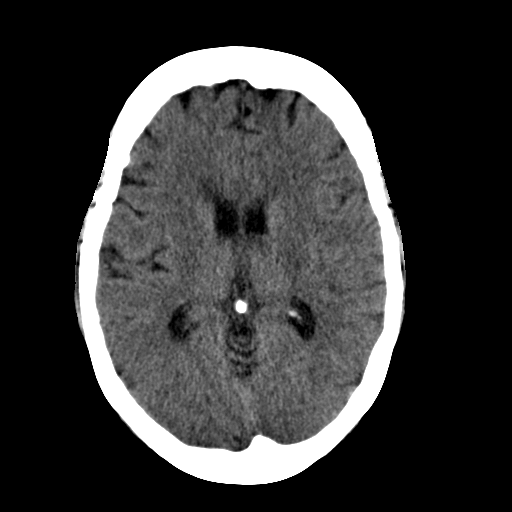
[im 14/28  brain]
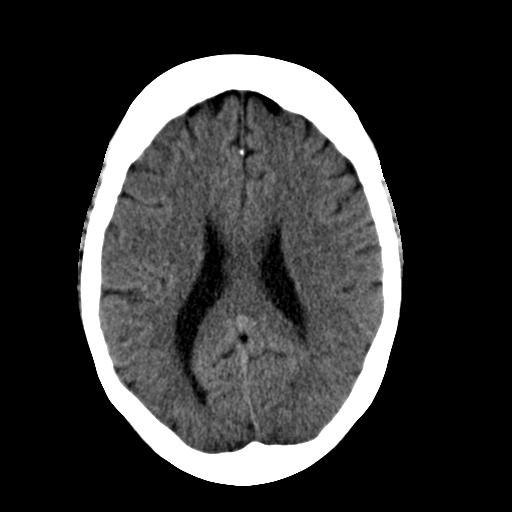
[im 15/28  brain]
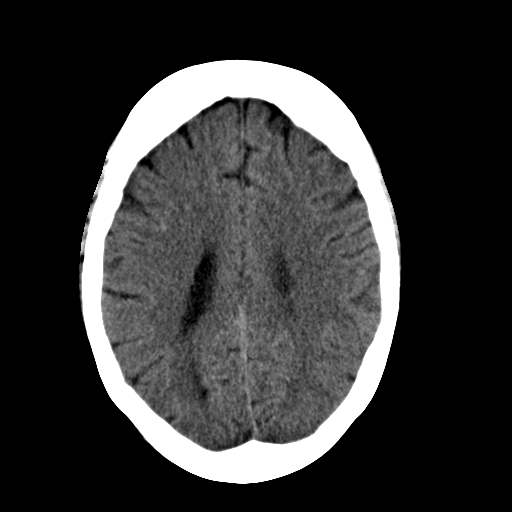
[im 15/28  bone]
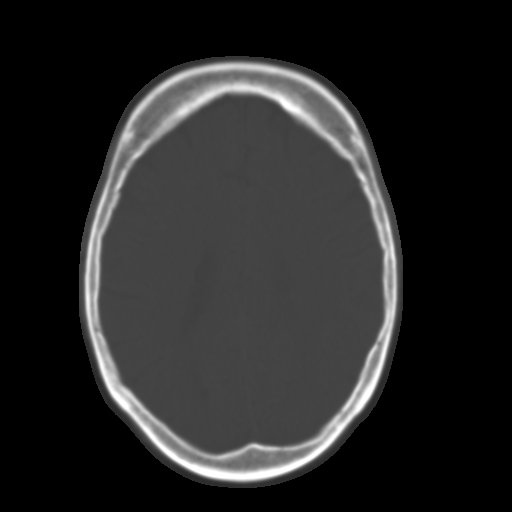
[im 17/28  brain]
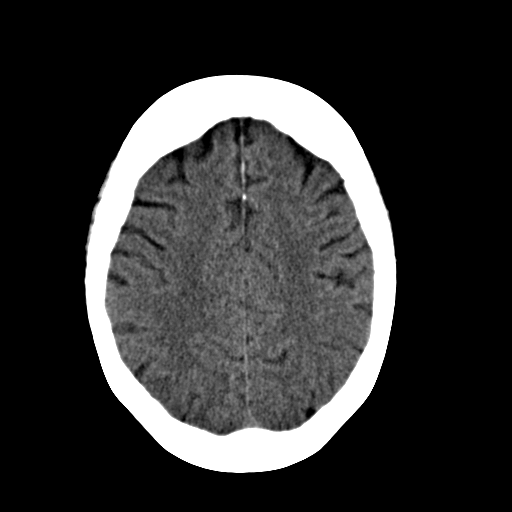
[im 19/28  brain]
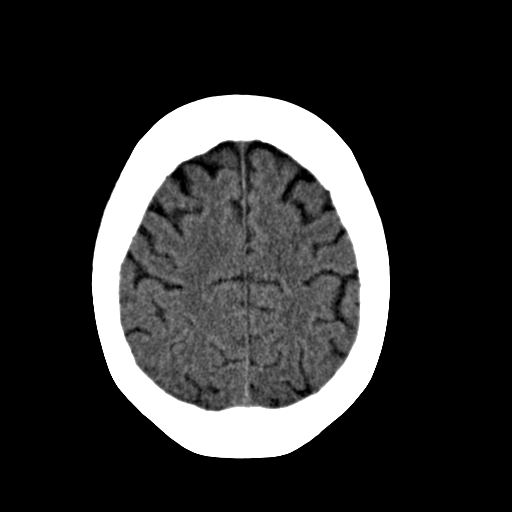
[im 20/28  brain]
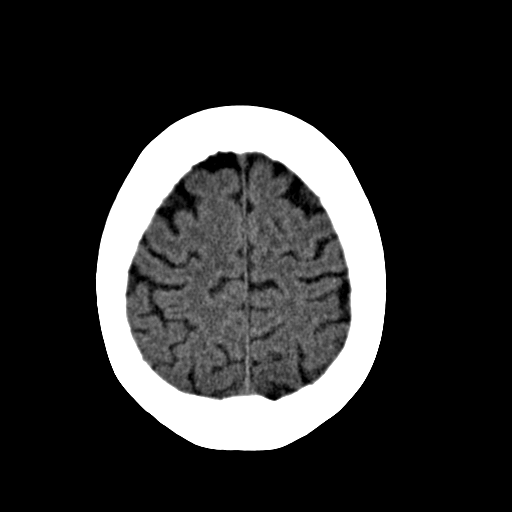
[im 22/28  brain]
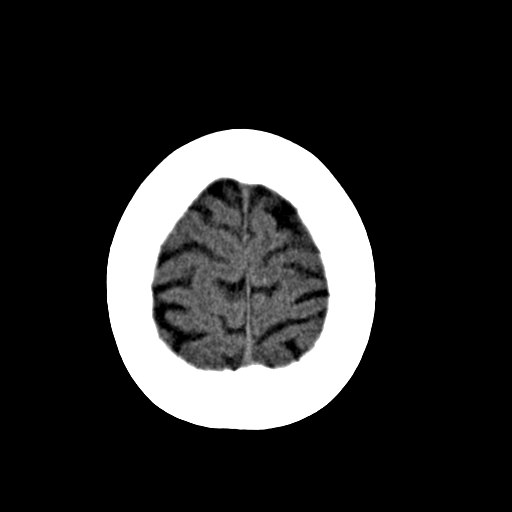
[im 22/28  bone]
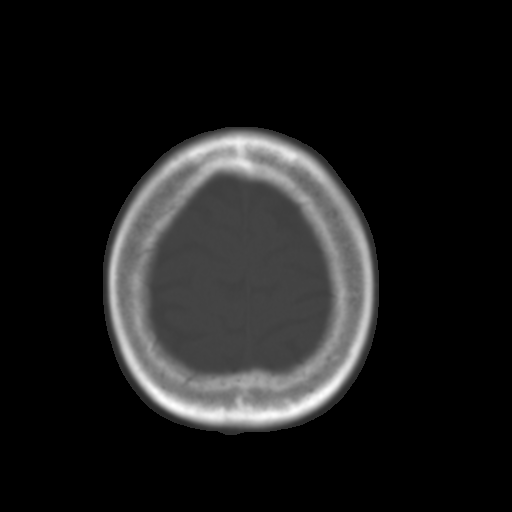
[im 23/28  brain]
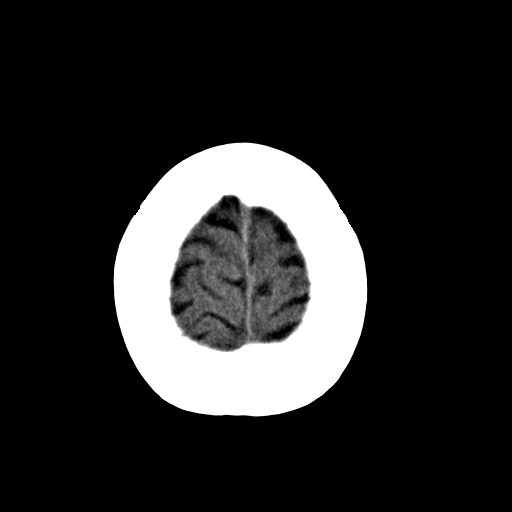
[im 25/28  brain]
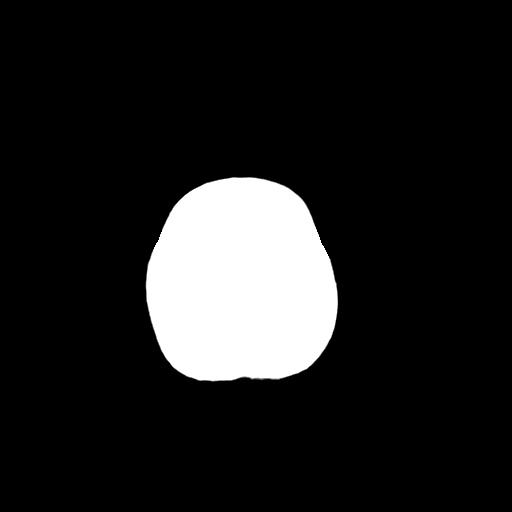
[im 27/28  brain]
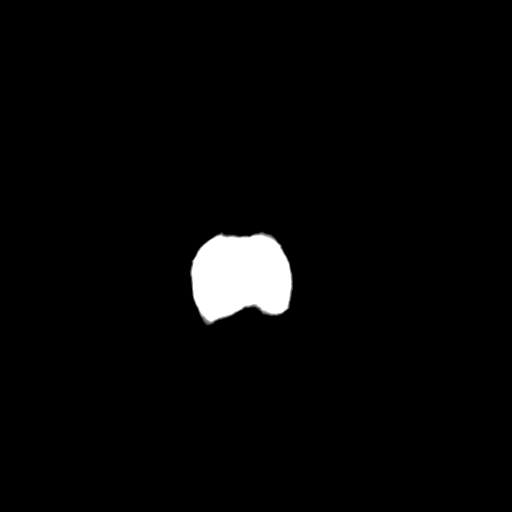

[16 of 28 positions shown; findings below may reference images not displayed]

FINDINGS: No acute intracranial abnormality. Specifically, no hemorrhage,
hydrocephalus, mass lesion, acute infarction, or significant
intracranial injury. No acute calvarial abnormality. Visualized
paranasal sinuses and mastoids clear. Orbital soft tissues
unremarkable.
IMPRESSION: No acute intracranial abnormality.

## 2016-03-06 ENCOUNTER — Inpatient Hospital Stay: Admission: RE | Admit: 2016-03-06 | Payer: Medicare Other | Source: Ambulatory Visit

## 2016-03-11 MED ORDER — KETOROLAC TROMETHAMINE 30 MG/ML IJ SOLN
INTRAMUSCULAR | Status: AC
  Filled 2016-03-11: qty 1

## 2016-03-13 ENCOUNTER — Encounter
Admission: RE | Admit: 2016-03-13 | Discharge: 2016-03-13 | Disposition: A | Discharge status: Home / Self Care | Payer: Medicare Other | Source: Ambulatory Visit | Attending: Surgery | Admitting: Surgery

## 2016-03-13 ENCOUNTER — Ambulatory Visit
Admission: RE | Admit: 2016-03-13 | Discharge: 2016-03-13 | Disposition: A | Discharge status: Home / Self Care | Payer: Medicare Other | Source: Ambulatory Visit | Attending: Surgery | Admitting: Surgery

## 2016-03-13 DIAGNOSIS — I498 Other specified cardiac arrhythmias: Secondary | ICD-10-CM | POA: Insufficient documentation

## 2016-03-13 DIAGNOSIS — M1712 Unilateral primary osteoarthritis, left knee: Secondary | ICD-10-CM | POA: Diagnosis not present

## 2016-03-13 DIAGNOSIS — I491 Atrial premature depolarization: Secondary | ICD-10-CM | POA: Diagnosis not present

## 2016-03-13 DIAGNOSIS — Z01812 Encounter for preprocedural laboratory examination: Secondary | ICD-10-CM | POA: Insufficient documentation

## 2016-03-13 DIAGNOSIS — Z01818 Encounter for other preprocedural examination: Secondary | ICD-10-CM | POA: Insufficient documentation

## 2016-03-13 DIAGNOSIS — Z0183 Encounter for blood typing: Secondary | ICD-10-CM | POA: Diagnosis not present

## 2016-03-13 DIAGNOSIS — I10 Essential (primary) hypertension: Secondary | ICD-10-CM | POA: Diagnosis not present

## 2016-03-13 HISTORY — DX: Unspecified osteoarthritis, unspecified site: M19.90

## 2016-03-13 HISTORY — DX: Other complications of anesthesia, initial encounter: T88.59XA

## 2016-03-13 HISTORY — DX: Hypothyroidism, unspecified: E03.9

## 2016-03-13 HISTORY — DX: Adverse effect of unspecified anesthetic, initial encounter: T41.45XA

## 2016-03-13 HISTORY — DX: Sleep apnea, unspecified: G47.30

## 2016-03-13 LAB — URINALYSIS COMPLETE WITH MICROSCOPIC (ARMC ONLY)
BILIRUBIN URINE: NEGATIVE
Bacteria, UA: NONE SEEN
GLUCOSE, UA: NEGATIVE mg/dL
Hgb urine dipstick: NEGATIVE
Ketones, ur: NEGATIVE mg/dL
LEUKOCYTES UA: NEGATIVE
Nitrite: NEGATIVE
Protein, ur: NEGATIVE mg/dL
Specific Gravity, Urine: 1.017 (ref 1.005–1.030)
pH: 7 (ref 5.0–8.0)

## 2016-03-13 LAB — BASIC METABOLIC PANEL
Anion gap: 8 (ref 5–15)
BUN: 14 mg/dL (ref 6–20)
CHLORIDE: 103 mmol/L (ref 101–111)
CO2: 27 mmol/L (ref 22–32)
CREATININE: 0.62 mg/dL (ref 0.44–1.00)
Calcium: 9.3 mg/dL (ref 8.9–10.3)
GFR calc non Af Amer: >60 mL/min (ref >60)
GLUCOSE: 94 mg/dL (ref 65–99)
Potassium: 3.4 mmol/L — ABNORMAL LOW (ref 3.5–5.1)
Sodium: 138 mmol/L (ref 135–145)

## 2016-03-13 LAB — CBC
HCT: 37.6 % (ref 35.0–47.0)
Hemoglobin: 12.8 g/dL (ref 12.0–16.0)
MCH: 32.9 pg (ref 26.0–34.0)
MCHC: 34.2 g/dL (ref 32.0–36.0)
MCV: 96.2 fL (ref 80.0–100.0)
PLATELETS: 184 10*3/uL (ref 150–440)
RBC: 3.91 MIL/uL (ref 3.80–5.20)
RDW: 14.1 % (ref 11.5–14.5)
WBC: 7.8 10*3/uL (ref 3.6–11.0)

## 2016-03-13 LAB — TYPE AND SCREEN
ABO/RH(D): O POS
Antibody Screen: NEGATIVE

## 2016-03-13 LAB — SURGICAL PCR SCREEN
MRSA, PCR: NEGATIVE
Staphylococcus aureus: NEGATIVE

## 2016-03-13 LAB — PROTIME-INR
INR: 0.97
Prothrombin Time: 12.9 seconds (ref 11.4–15.2)

## 2016-03-13 NOTE — Patient Instructions (Signed)
  Your procedure is scheduled on: 03/21/16 Report to Day Surgery. [facility]OND FLOOR To find out your arrival time please call [phone] between 1PM - 3PM on 03/20/16  Remember: Instructions that are not followed completely may result in serious medical risk, up to and including death, or upon the discretion of your surgeon and anesthesiologist your surgery may need to be rescheduled.    _X___ 1. Do not eat food or drink liquids after midnight. No gum chewing or hard candies.     _X___ 2. No Alcohol for 24 hours before or after surgery.   ___X_ 3. Do Not Smoke For 24 Hours Prior to Your Surgery.   ____ 4. Bring all medications with you on the day of surgery if instructed.    _X___ 5. Notify your doctor if there is any change in your medical condition     (cold, fever, infections).       Do not wear jewelry, make-up, hairpins, clips or nail polish.  Do not wear lotions, powders, or perfumes. You may wear deodorant.  Do not shave 48 hours prior to surgery. Men may shave face and neck.  Do not bring valuables to the hospital.    [facility] is not responsible for any belongings or valuables.               Contacts, dentures or bridgework may not be worn into surgery.  Leave your suitcase in the car. After surgery it may be brought to your room.  For patients admitted to the hospital, discharge time is determined by your                treatment team.   Patients discharged the day of surgery will not be allowed to drive home.   Please read over the following fact sheets that you were given:   MRSA Information   _X___ Take these medicines the morning of surgery with A SIP OF WATER:    1. LEVOTHYROXINE  2. OMEPRAZOLE  3. VALSARTAN  4.  5.  6.  ____ Fleet Enema (as directed)   __X__ Use CHG Soap as directed  ____ Use inhalers on the day of surgery  ____ Stop metformin 2 days prior to surgery    ____ Take 1/2 of usual insulin dose the night before surgery and none  on the morning of surgery.   ____ Stop Coumadin/Plavix/aspirin on  __X__ Stop Anti-inflammatories on        STOP VOLTAREN NOW    ____ Stop supplements until after surgery.    __X__ Bring C-Pap to the hospital.

## 2016-03-21 ENCOUNTER — Inpatient Hospital Stay: Payer: Medicare Other | Admitting: Anesthesiology

## 2016-03-21 ENCOUNTER — Inpatient Hospital Stay: Payer: Medicare Other

## 2016-03-21 ENCOUNTER — Encounter
Admission: RE | Disposition: A | Discharge status: Skilled Nursing Facility | Payer: Self-pay | Source: Ambulatory Visit | Attending: Surgery

## 2016-03-21 ENCOUNTER — Inpatient Hospital Stay
Admission: RE | Admit: 2016-03-21 | Discharge: 2016-03-24 | DRG: 470 | Disposition: A | Discharge status: Skilled Nursing Facility | Payer: Medicare Other | Source: Ambulatory Visit | Attending: Surgery | Admitting: Surgery

## 2016-03-21 DIAGNOSIS — G4730 Sleep apnea, unspecified: Secondary | ICD-10-CM | POA: Diagnosis present

## 2016-03-21 DIAGNOSIS — K219 Gastro-esophageal reflux disease without esophagitis: Secondary | ICD-10-CM | POA: Diagnosis present

## 2016-03-21 DIAGNOSIS — E039 Hypothyroidism, unspecified: Secondary | ICD-10-CM | POA: Diagnosis present

## 2016-03-21 DIAGNOSIS — M1712 Unilateral primary osteoarthritis, left knee: Secondary | ICD-10-CM | POA: Diagnosis present

## 2016-03-21 DIAGNOSIS — I10 Essential (primary) hypertension: Secondary | ICD-10-CM | POA: Diagnosis present

## 2016-03-21 DIAGNOSIS — R112 Nausea with vomiting, unspecified: Secondary | ICD-10-CM | POA: Diagnosis not present

## 2016-03-21 DIAGNOSIS — R51 Headache: Secondary | ICD-10-CM | POA: Diagnosis not present

## 2016-03-21 DIAGNOSIS — D62 Acute posthemorrhagic anemia: Secondary | ICD-10-CM | POA: Diagnosis not present

## 2016-03-21 DIAGNOSIS — M25562 Pain in left knee: Secondary | ICD-10-CM

## 2016-03-21 DIAGNOSIS — Z96652 Presence of left artificial knee joint: Secondary | ICD-10-CM

## 2016-03-21 DIAGNOSIS — R262 Difficulty in walking, not elsewhere classified: Secondary | ICD-10-CM

## 2016-03-21 DIAGNOSIS — R2689 Other abnormalities of gait and mobility: Secondary | ICD-10-CM

## 2016-03-21 DIAGNOSIS — M6281 Muscle weakness (generalized): Secondary | ICD-10-CM

## 2016-03-21 HISTORY — PX: TOTAL KNEE ARTHROPLASTY: SHX125

## 2016-03-21 LAB — ABO/RH: ABO/RH(D): O POS

## 2016-03-21 SURGERY — ARTHROPLASTY, KNEE, TOTAL
Anesthesia: Choice | Laterality: Left

## 2016-03-21 SURGERY — ARTHROPLASTY, KNEE, TOTAL
Anesthesia: Spinal | Site: Knee | Laterality: Left | Wound class: Clean

## 2016-03-21 MED ORDER — BUPIVACAINE LIPOSOME 1.3 % IJ SUSP
INTRAMUSCULAR | Status: AC
  Filled 2016-03-21: qty 20

## 2016-03-21 MED ORDER — ONDANSETRON HCL 4 MG/2ML IJ SOLN: 4.0000 mg | Freq: Once | INTRAMUSCULAR | Status: DC | PRN

## 2016-03-21 MED ORDER — PHENYLEPHRINE HCL 10 MG/ML IJ SOLN
INTRAMUSCULAR | Status: DC | PRN
  Administered 2016-03-21 (×3): 100 ug via INTRAVENOUS

## 2016-03-21 MED ORDER — SODIUM CHLORIDE 0.9 % IJ SOLN: INTRAMUSCULAR | Status: DC | PRN

## 2016-03-21 MED ORDER — LEVOTHYROXINE SODIUM 100 MCG PO TABS
100.0000 ug | ORAL_TABLET | Freq: Every day | ORAL | Status: DC
  Administered 2016-03-22 – 2016-03-24 (×3): 100 ug via ORAL
  Filled 2016-03-21 (×3): qty 1

## 2016-03-21 MED ORDER — NEOMYCIN-POLYMYXIN B GU 40-200000 IR SOLN
Status: AC
  Filled 2016-03-21: qty 20

## 2016-03-21 MED ORDER — FENTANYL CITRATE (PF) 100 MCG/2ML IJ SOLN
INTRAMUSCULAR | Status: DC | PRN
  Administered 2016-03-21: 1 ug via INTRAVENOUS

## 2016-03-21 MED ORDER — DEXAMETHASONE SODIUM PHOSPHATE 10 MG/ML IJ SOLN
INTRAMUSCULAR | Status: DC | PRN
  Administered 2016-03-21: 10 mg via INTRAVENOUS

## 2016-03-21 MED ORDER — CEFAZOLIN SODIUM-DEXTROSE 2-4 GM/100ML-% IV SOLN
2.0000 g | Freq: Four times a day (QID) | INTRAVENOUS | Status: AC
  Administered 2016-03-21 – 2016-03-22 (×3): 2 g via INTRAVENOUS
  Filled 2016-03-21 (×3): qty 100

## 2016-03-21 MED ORDER — CALCIUM CARBONATE-VITAMIN D 500-200 MG-UNIT PO TABS
1.0000 | ORAL_TABLET | Freq: Two times a day (BID) | ORAL | Status: DC
  Administered 2016-03-21 – 2016-03-24 (×7): 1 via ORAL
  Filled 2016-03-21 (×7): qty 1

## 2016-03-21 MED ORDER — ONDANSETRON HCL 4 MG/2ML IJ SOLN
INTRAMUSCULAR | Status: DC | PRN
  Administered 2016-03-21: 4 mg via INTRAVENOUS

## 2016-03-21 MED ORDER — PANTOPRAZOLE SODIUM 40 MG PO TBEC
40.0000 mg | DELAYED_RELEASE_TABLET | Freq: Two times a day (BID) | ORAL | Status: DC
  Administered 2016-03-21 – 2016-03-24 (×7): 40 mg via ORAL
  Filled 2016-03-21 (×7): qty 1

## 2016-03-21 MED ORDER — KETOROLAC TROMETHAMINE 15 MG/ML IJ SOLN
7.5000 mg | Freq: Four times a day (QID) | INTRAMUSCULAR | Status: DC
  Administered 2016-03-21 – 2016-03-24 (×10): 7.5 mg via INTRAVENOUS
  Filled 2016-03-21 (×10): qty 1

## 2016-03-21 MED ORDER — HYDROMORPHONE HCL 1 MG/ML IJ SOLN
1.0000 mg | INTRAMUSCULAR | Status: DC | PRN
  Administered 2016-03-21: 2 mg via INTRAVENOUS
  Administered 2016-03-22: 1 mg via INTRAVENOUS
  Filled 2016-03-21: qty 1
  Filled 2016-03-21: qty 2

## 2016-03-21 MED ORDER — MAGNESIUM HYDROXIDE 400 MG/5ML PO SUSP
30.0000 mL | Freq: Every day | ORAL | Status: DC | PRN
  Administered 2016-03-23: 30 mL via ORAL
  Filled 2016-03-21: qty 30

## 2016-03-21 MED ORDER — ACETAMINOPHEN 500 MG PO TABS
1000.0000 mg | ORAL_TABLET | Freq: Four times a day (QID) | ORAL | Status: AC
  Administered 2016-03-21 – 2016-03-22 (×4): 1000 mg via ORAL
  Filled 2016-03-21 (×4): qty 2

## 2016-03-21 MED ORDER — BISACODYL 10 MG RE SUPP: 10.0000 mg | Freq: Every day | RECTAL | Status: DC | PRN

## 2016-03-21 MED ORDER — MIDAZOLAM HCL 2 MG/2ML IJ SOLN
INTRAMUSCULAR | Status: DC | PRN
  Administered 2016-03-21: 1 mg via INTRAVENOUS

## 2016-03-21 MED ORDER — SODIUM CHLORIDE 0.9 % IV SOLN
INTRAVENOUS | Status: DC | PRN
  Administered 2016-03-21: 60 mL

## 2016-03-21 MED ORDER — CEFAZOLIN SODIUM-DEXTROSE 2-4 GM/100ML-% IV SOLN
INTRAVENOUS | Status: AC
  Filled 2016-03-21: qty 100

## 2016-03-21 MED ORDER — ACETAMINOPHEN 650 MG RE SUPP: 650.0000 mg | Freq: Four times a day (QID) | RECTAL | Status: DC | PRN

## 2016-03-21 MED ORDER — BUPIVACAINE-EPINEPHRINE (PF) 0.5% -1:200000 IJ SOLN
INTRAMUSCULAR | Status: AC
  Filled 2016-03-21: qty 30

## 2016-03-21 MED ORDER — TRANEXAMIC ACID 1000 MG/10ML IV SOLN
INTRAVENOUS | Status: DC | PRN
  Administered 2016-03-21: 1000 mg

## 2016-03-21 MED ORDER — SODIUM CHLORIDE 0.9 % IJ SOLN
INTRAMUSCULAR | Status: AC
  Filled 2016-03-21: qty 50

## 2016-03-21 MED ORDER — FENTANYL CITRATE (PF) 100 MCG/2ML IJ SOLN: 25.0000 ug | INTRAMUSCULAR | Status: DC | PRN

## 2016-03-21 MED ORDER — CEFAZOLIN SODIUM-DEXTROSE 2-4 GM/100ML-% IV SOLN
2.0000 g | Freq: Once | INTRAVENOUS | Status: AC
  Administered 2016-03-21: 2 g via INTRAVENOUS

## 2016-03-21 MED ORDER — TRANEXAMIC ACID 1000 MG/10ML IV SOLN
INTRAVENOUS | Status: AC
  Filled 2016-03-21: qty 10

## 2016-03-21 MED ORDER — ENOXAPARIN SODIUM 40 MG/0.4ML ~~LOC~~ SOLN
40.0000 mg | SUBCUTANEOUS | Status: DC
  Administered 2016-03-22 – 2016-03-24 (×3): 40 mg via SUBCUTANEOUS
  Filled 2016-03-21 (×3): qty 0.4

## 2016-03-21 MED ORDER — ONDANSETRON HCL 4 MG/2ML IJ SOLN
4.0000 mg | Freq: Four times a day (QID) | INTRAMUSCULAR | Status: DC | PRN
  Administered 2016-03-21 – 2016-03-22 (×2): 4 mg via INTRAVENOUS
  Filled 2016-03-21 (×2): qty 2

## 2016-03-21 MED ORDER — KETOROLAC TROMETHAMINE 15 MG/ML IJ SOLN
15.0000 mg | Freq: Once | INTRAMUSCULAR | Status: AC
  Administered 2016-03-21: 15 mg via INTRAVENOUS
  Filled 2016-03-21: qty 1

## 2016-03-21 MED ORDER — KCL IN DEXTROSE-NACL 20-5-0.9 MEQ/L-%-% IV SOLN
INTRAVENOUS | Status: DC
  Administered 2016-03-21 – 2016-03-22 (×3): via INTRAVENOUS
  Filled 2016-03-21 (×9): qty 1000

## 2016-03-21 MED ORDER — IRBESARTAN 150 MG PO TABS
300.0000 mg | ORAL_TABLET | Freq: Every day | ORAL | Status: DC
  Administered 2016-03-21 – 2016-03-24 (×4): 300 mg via ORAL
  Filled 2016-03-21 (×4): qty 2

## 2016-03-21 MED ORDER — BUPIVACAINE-EPINEPHRINE (PF) 0.5% -1:200000 IJ SOLN
INTRAMUSCULAR | Status: DC | PRN
  Administered 2016-03-21: 30 mL via PERINEURAL

## 2016-03-21 MED ORDER — LACTATED RINGERS IV SOLN
INTRAVENOUS | Status: DC
  Administered 2016-03-21 (×2): via INTRAVENOUS

## 2016-03-21 MED ORDER — INFLUENZA VAC SPLIT QUAD 0.5 ML IM SUSY
0.5000 mL | PREFILLED_SYRINGE | INTRAMUSCULAR | Status: DC
  Filled 2016-03-21: qty 0.5

## 2016-03-21 MED ORDER — ACETAMINOPHEN 325 MG PO TABS
650.0000 mg | ORAL_TABLET | Freq: Four times a day (QID) | ORAL | Status: DC | PRN
  Administered 2016-03-22 – 2016-03-23 (×2): 650 mg via ORAL
  Filled 2016-03-21 (×2): qty 2

## 2016-03-21 MED ORDER — NEOMYCIN-POLYMYXIN B GU 40-200000 IR SOLN
Status: DC | PRN
  Administered 2016-03-21: 16 mL

## 2016-03-21 MED ORDER — METOCLOPRAMIDE HCL 10 MG PO TABS
5.0000 mg | ORAL_TABLET | Freq: Three times a day (TID) | ORAL | Status: DC | PRN
Start: 2016-03-21 — End: 2016-03-24

## 2016-03-21 MED ORDER — DOCUSATE SODIUM 100 MG PO CAPS
100.0000 mg | ORAL_CAPSULE | Freq: Two times a day (BID) | ORAL | Status: DC
  Administered 2016-03-21 – 2016-03-24 (×7): 100 mg via ORAL
  Filled 2016-03-21 (×7): qty 1

## 2016-03-21 MED ORDER — PROPOFOL 500 MG/50ML IV EMUL
INTRAVENOUS | Status: DC | PRN
  Administered 2016-03-21: 75 ug/kg/min via INTRAVENOUS

## 2016-03-21 MED ORDER — FLEET ENEMA 7-19 GM/118ML RE ENEM
1.0000 | ENEMA | Freq: Once | RECTAL | Status: DC | PRN
Start: 2016-03-21 — End: 2016-03-24

## 2016-03-21 MED ORDER — METOPROLOL TARTRATE 25 MG PO TABS
25.0000 mg | ORAL_TABLET | Freq: Two times a day (BID) | ORAL | Status: DC
  Administered 2016-03-21 – 2016-03-24 (×7): 25 mg via ORAL
  Filled 2016-03-21 (×8): qty 1

## 2016-03-21 MED ORDER — DIPHENHYDRAMINE HCL 12.5 MG/5ML PO ELIX
12.5000 mg | ORAL_SOLUTION | ORAL | Status: DC | PRN
  Administered 2016-03-23: 25 mg via ORAL
  Filled 2016-03-21: qty 10

## 2016-03-21 MED ORDER — ONDANSETRON HCL 4 MG PO TABS
4.0000 mg | ORAL_TABLET | Freq: Four times a day (QID) | ORAL | Status: DC | PRN
  Administered 2016-03-23 (×2): 4 mg via ORAL
  Filled 2016-03-21 (×2): qty 1

## 2016-03-21 MED ORDER — OXYCODONE HCL 5 MG PO TABS
5.0000 mg | ORAL_TABLET | ORAL | Status: DC | PRN
  Administered 2016-03-21 (×2): 10 mg via ORAL
  Filled 2016-03-21 (×3): qty 2

## 2016-03-21 MED ORDER — METOCLOPRAMIDE HCL 5 MG/ML IJ SOLN: 5.0000 mg | Freq: Three times a day (TID) | INTRAMUSCULAR | Status: DC | PRN

## 2016-03-21 MED ORDER — MIRTAZAPINE 15 MG PO TABS
7.5000 mg | ORAL_TABLET | Freq: Every day | ORAL | Status: DC
Start: 2016-03-21 — End: 2016-03-24
  Administered 2016-03-21: 7.5 mg via ORAL
  Administered 2016-03-22: 20:00:00 via ORAL
  Administered 2016-03-23: 7.5 mg via ORAL
  Filled 2016-03-21 (×3): qty 1

## 2016-03-21 MED ORDER — FLUTICASONE PROPIONATE 50 MCG/ACT NA SUSP
1.0000 | Freq: Every day | NASAL | Status: DC
  Administered 2016-03-21 – 2016-03-24 (×4): 1 via NASAL
  Filled 2016-03-21: qty 16

## 2016-03-21 MED ORDER — BUPIVACAINE HCL (PF) 0.5 % IJ SOLN
INTRAMUSCULAR | Status: DC | PRN
  Administered 2016-03-21: 3 mL

## 2016-03-21 MED ORDER — LIDOCAINE HCL (CARDIAC) 20 MG/ML IV SOLN
INTRAVENOUS | Status: DC | PRN
  Administered 2016-03-21: 30 mg via INTRAVENOUS

## 2016-03-21 SURGICAL SUPPLY — 62 items
BANDAGE ELASTIC 6 CLIP ST LF (GAUZE/BANDAGES/DRESSINGS) ×2 IMPLANT
BIT DRILL QUICK REL 1/8 2PK SL (DRILL) ×1 IMPLANT
BLADE SAW SAG 25X90X1.19 (BLADE) ×2 IMPLANT
BLADE SURG SZ20 CARB STEEL (BLADE) ×2 IMPLANT
BNDG COHESIVE 6X5 TAN STRL LF (GAUZE/BANDAGES/DRESSINGS) ×2 IMPLANT
BONE CEMENT PALACOSE (Orthopedic Implant) ×4 IMPLANT
CANISTER SUCT 1200ML W/VALVE (MISCELLANEOUS) ×2 IMPLANT
CANISTER SUCT 3000ML (MISCELLANEOUS) ×2 IMPLANT
CAPT KNEE TOTAL 3 ×2 IMPLANT
CATH TRAY METER 16FR LF (MISCELLANEOUS) ×2 IMPLANT
CEMENT BONE PALACOSE (Orthopedic Implant) ×2 IMPLANT
CEMENT MIXER IMPLANT
CHLORAPREP W/TINT 26ML (MISCELLANEOUS) ×2 IMPLANT
COOLER POLAR GLACIER W/PUMP (MISCELLANEOUS) ×2 IMPLANT
COVER MAYO STAND STRL (DRAPES) ×2 IMPLANT
CUFF TOURN 24 STER (MISCELLANEOUS) IMPLANT
CUFF TOURN 30 STER DUAL PORT (MISCELLANEOUS) ×2 IMPLANT
DRAPE IMP U-DRAPE 54X76 (DRAPES) ×2 IMPLANT
DRAPE INCISE IOBAN 66X45 STRL (DRAPES) ×4 IMPLANT
DRILL QUICK RELEASE 1/8 INCH (DRILL) ×1
DRSG OPSITE POSTOP 4X12 (GAUZE/BANDAGES/DRESSINGS) ×2 IMPLANT
DRSG OPSITE POSTOP 4X14 (GAUZE/BANDAGES/DRESSINGS) IMPLANT
ELASTIC BANDAGE- 6" X5.8 YARD ×2 IMPLANT
ELECT CAUTERY BLADE 6.4 (BLADE) ×2 IMPLANT
ELECT REM PT RETURN 9FT ADLT (ELECTROSURGICAL) ×2
ELECTRODE REM PT RTRN 9FT ADLT (ELECTROSURGICAL) ×1 IMPLANT
GLOVE BIO SURGEON STRL SZ7 (GLOVE) ×8 IMPLANT
GLOVE BIO SURGEON STRL SZ7.5 (GLOVE) ×4 IMPLANT
GLOVE BIO SURGEON STRL SZ8 (GLOVE) ×6 IMPLANT
GLOVE BIOGEL PI IND STRL 8 (GLOVE) ×1 IMPLANT
GLOVE BIOGEL PI INDICATOR 8 (GLOVE) ×1
GLOVE INDICATOR 8.0 STRL GRN (GLOVE) ×6 IMPLANT
GOWN STRL REUS W/ TWL LRG LVL3 (GOWN DISPOSABLE) ×4 IMPLANT
GOWN STRL REUS W/ TWL XL LVL3 (GOWN DISPOSABLE) ×1 IMPLANT
GOWN STRL REUS W/TWL LRG LVL3 (GOWN DISPOSABLE) ×4
GOWN STRL REUS W/TWL XL LVL3 (GOWN DISPOSABLE) ×1
HANDPIECE INTERPULSE COAX TIP (DISPOSABLE) ×1
HOLDER FOLEY CATH W/STRAP (MISCELLANEOUS) ×2 IMPLANT
HOOD PEEL AWAY FLYTE STAYCOOL (MISCELLANEOUS) ×6 IMPLANT
IMMBOLIZER KNEE 19 BLUE UNIV (SOFTGOODS) ×2 IMPLANT
KIT RM TURNOVER STRD PROC AR (KITS) ×2 IMPLANT
MARKER SKIN DUAL TIP RULER LAB (MISCELLANEOUS) ×2 IMPLANT
NDL SAFETY 18GX1.5 (NEEDLE) ×2 IMPLANT
NEEDLE 18GX1X1/2 (RX/OR ONLY) (NEEDLE) ×2 IMPLANT
NEEDLE SPNL 20GX3.5 QUINCKE YW (NEEDLE) ×2 IMPLANT
NS IRRIG 1000ML POUR BTL (IV SOLUTION) ×2 IMPLANT
PACK TOTAL KNEE (MISCELLANEOUS) ×2 IMPLANT
PAD WRAPON POLAR KNEE (MISCELLANEOUS) ×1 IMPLANT
SET HNDPC FAN SPRY TIP SCT (DISPOSABLE) ×1 IMPLANT
SOL .9 NS 3000ML IRR  AL (IV SOLUTION) ×1
SOL .9 NS 3000ML IRR UROMATIC (IV SOLUTION) ×1 IMPLANT
STAPLER SKIN PROX 35W (STAPLE) ×2 IMPLANT
SUCTION FRAZIER HANDLE 10FR (MISCELLANEOUS) ×1
SUCTION TUBE FRAZIER 10FR DISP (MISCELLANEOUS) ×1 IMPLANT
SUT VIC AB 0 CT1 36 (SUTURE) ×6 IMPLANT
SUT VIC AB 2-0 CT1 27 (SUTURE) ×4
SUT VIC AB 2-0 CT1 TAPERPNT 27 (SUTURE) ×4 IMPLANT
SYR 20CC LL (SYRINGE) ×2 IMPLANT
SYR 30ML LL (SYRINGE) ×2 IMPLANT
SYRINGE 10CC LL (SYRINGE) ×2 IMPLANT
SYSTEM VACUUM CEMENT MIXING ×2 IMPLANT
WRAPON POLAR PAD KNEE (MISCELLANEOUS) ×2

## 2016-03-21 NOTE — H&P (Signed)
Paper H&P to be scanned into permanent record. H&P reviewed. No changes. 

## 2016-03-21 NOTE — Anesthesia Procedure Notes (Addendum)
Spinal  Patient location during procedure: OR Start time: 03/21/2016 7:35 AM End time: 03/21/2016 7:50 AM Staffing Anesthesiologist: Gunnar Fusi Performed: anesthesiologist  Preanesthetic Checklist Completed: patient identified, site marked, surgical consent, pre-op evaluation, timeout performed, IV checked, risks and benefits discussed and monitors and equipment checked Spinal Block Patient position: sitting Prep: Betadine Patient monitoring: heart rate, continuous pulse ox, blood pressure and cardiac monitor Approach: midline Location: L4-5 Injection technique: single-shot Needle Needle type: Whitacre and Introducer  Needle gauge: 25 G Needle length: 12.7 cm Needle insertion depth: 11 cm Additional Notes Negative paresthesia. Negative blood return. Positive free-flowing CSF. Expiration date of kit checked and confirmed. Patient tolerated procedure well, without complications.

## 2016-03-21 NOTE — Anesthesia Preprocedure Evaluation (Signed)
Anesthesia Evaluation  Patient identified by MRN, date of birth, ID band Patient awake    Reviewed: Allergy & Precautions, NPO status , Patient's Chart, lab work & pertinent test results  History of Anesthesia Complications (+) history of anesthetic complications (pt doesn't "like" anesthesia)  Airway Mallampati: II       Dental   Pulmonary neg pulmonary ROS, sleep apnea and Continuous Positive Airway Pressure Ventilation ,           Cardiovascular hypertension, Pt. on medications      Neuro/Psych negative neurological ROS     GI/Hepatic Neg liver ROS, GERD  Medicated,  Endo/Other  negative endocrine ROSHypothyroidism   Renal/GU negative Renal ROS     Musculoskeletal   Abdominal   Peds  Hematology negative hematology ROS (+)   Anesthesia Other Findings   Reproductive/Obstetrics                             Anesthesia Physical Anesthesia Plan  ASA: II  Anesthesia Plan: Spinal   Post-op Pain Management:    Induction:   Airway Management Planned:   Additional Equipment:   Intra-op Plan:   Post-operative Plan:   Informed Consent: I have reviewed the patients History and Physical, chart, labs and discussed the procedure including the risks, benefits and alternatives for the proposed anesthesia with the patient or authorized representative who has indicated his/her understanding and acceptance.     Plan Discussed with:   Anesthesia Plan Comments:         Anesthesia Quick Evaluation

## 2016-03-21 NOTE — Clinical Social Work Note (Signed)
Clinical Social Work Assessment  Patient Details  Name: Erika Crawford MRN: [id number] Date of Birth: August 18, 1940  Date of referral:  03/21/16               Reason for consult:  Discharge Planning, Facility Placement                Permission sought to share information with:  Chartered certified accountant granted to share information::  Yes, Verbal Permission Granted  Name::      [place]::     Relationship::     Contact Information:     Housing/Transportation Living arrangements for the past 2 months:  [place] of Information:  Patient Patient Interpreter Needed:  None Criminal Activity/Legal Involvement Pertinent to Current Situation/Hospitalization:  No - Comment as needed Significant Relationships:  Adult Children Lives with:  Self Do you feel safe going back to the place where you live?  Yes Need for family participation in patient care:  Yes (Comment)  Care giving concerns:  Patient lives by herself in [place].    Social Worker assessment / plan:  Social work Theatre manager received SNF consult. PT is recommending SNF on post-op day 0. Social work Theatre manager met with patient at beside. Social work Theatre manager described role of social work. Per patient, she lives by herself  in [place]. Patient has one son that lives in [place] as well. Social work Theatre manager explained to patient that PT is recommending short-term rehab at [facility]. Patient verbally agreed she would go. Social work Theatre manager explained to patient that with her insurance of [employer], requires authorization that the SNF would obtain. Patient verbally said she understood. Per patient, she mentioned she has heard of [facility], but is wanting to look at other facilities in the area. Social work Theatre manager left SNF list in patients room. Patient does not have a HPOA at this time.   Fl2 completed and faxed out.   Employment status:  Retired Office manager PT Recommendations:  [place] / Referral to community resources:  [place]  Patient/Family's Response to care:  Patient verbally agreed that she would go to a SNF for short-term rehab.   Patient/Family's Understanding of and Emotional Response to Diagnosis, Current Treatment, and Prognosis:  Patient thanked social work Theatre manager for coming by.   Emotional Assessment Appearance:  Appears stated age Attitude/Demeanor/Rapport:    Affect (typically observed):  Accepting, Adaptable, Appropriate Orientation:  Oriented to Self, Oriented to Place, Oriented to  Time, Oriented to Situation Alcohol / Substance use:  Not Applicable Psych involvement (Current and /or in the community):  No (Comment)  Discharge Needs  Concerns to be addressed:  Basic Needs, Discharge Planning Concerns Readmission within the last 30 days:  No Current discharge risk:  None Barriers to Discharge:      Danie Chandler, Student-Social Work 03/21/2016, 3:04 PM

## 2016-03-21 NOTE — Plan of Care (Signed)
Attempted to place patient on CPM, but unable to find sheep skin cover to apply.  Materials contacted and stated they do not stock.  Charge nurse informed and is working on trying to fix problem.

## 2016-03-21 NOTE — Op Note (Signed)
03/21/2016  10:15 AM  Patient:   Erika Crawford  Pre-Op Diagnosis:   Degenerative joint disease, left knee.  Post-Op Diagnosis:   Same  Procedure:   Left TKA using all-cemented Biomet Vanguard system with a 67.5 mm mm PCR femur, a 71 mm tibial tray with a 10 mm E-poly insert, and a 34 x 8.5 mm all-poly 3-pegged domed patella.  Surgeon:   Pascal Lux, MD  Assistant:   Cameron Proud, PA-C; Donnie Coffin, PA-S  Anesthesia:   Spinal  Findings:   As above  Complications:   None  EBL:   10 cc  Fluids:   1200 cc crystalloid  UOP:   200 cc  TT:   95 minutes at 300 mmHg  Drains:   None  Closure:   Staples  Implants:   As above  Brief Clinical Note:   The patient is a 75 year old female with a long history of progressively worsening left knee pain. The patient's symptoms have progressed despite medications, activity modification, injections, etc. The patient's history and examination were consistent with advanced degenerative joint disease of the right knee confirmed by plain radiographs. The patient presents at this time for a left total knee arthroplasty.  Procedure:   The patient was brought into the operating room. After adequate spinal anesthesia was obtained, the patient was lain in the supine position. A Foley catheter was placed by the nurse before the right lower extremity was prepped with ChloraPrep solution and draped sterilely. Preoperative antibiotics were administered. After verifying the proper laterality with a surgical timeout, the limb was exsanguinated with an Esmarch and the tourniquet inflated to 300 mmHg. A standard anterior approach to the knee was made through an approximately 7 inch incision. The incision was carried down through the subcutaneous tissues to expose superficial retinaculum. This was split the length of the incision and the medial flap elevated sufficiently to expose the medial retinaculum. The medial retinaculum was incised, leaving a 3-4 mm  cuff of tissue on the patella. This was extended distally along the medial border of the patellar tendon and proximally through the medial third of the quadriceps tendon. A subtotal fat pad excision was performed before the soft tissues were elevated off the anteromedial and anterolateral aspects of the proximal tibia to the level of the collateral ligaments. The anterior portions of the medial and lateral menisci were removed, as was the anterior cruciate ligament. With the knee flexed to 90, the external tibial guide was positioned and the appropriate proximal tibial cut made. This piece was taken to the back table where it was measured and found to be optimally replicated by a 71 mm component.  Attention was directed to the distal femur. The intramedullary canal was accessed through a 3/8" drill hole. The intramedullary guide was inserted and position in order to obtain a neutral flexion gap. The intercondylar block was positioned with care taken to avoid notching the anterior cortex of the femur. The appropriate cut was made. Next, the distal cutting block was placed at 6 of valgus alignment. Using the 9 mm slot, the distal cut was made. The distal femur was measured and found to be optimally replicated by the XX123456 mm component. The 67.5 mm 4-in-1 cutting block was positioned and first the posterior, then the posterior chamfer, the anterior chamfer, femoral and intercondylar cuts were made. At this point, the posterior portions medial and lateral menisci were removed. A trial reduction was performed using the appropriate femoral and tibial components with  the 10 mm insert. This demonstrated excellent stability to varus and valgus stressing both in flexion and extension while permitting full extension. Patella tracking was assessed and found to be excellent. Therefore, the tibial guide position was marked on the proximal tibia. The patella thickness was measured and found to be 21 mm. Therefore, the  appropriate cut was made. The surfaces were measured and found to be optimally replicated by the 34 mm component. The three peg holes were drilled in place before the trial button was inserted. Patella tracking was assessed and found to be excellent, passing the "no thumb test". The lug holes were drilled into the distal femur before the trial component was removed, leaving only the tibial tray. The keel was then created using the appropriate tower, reamer, and punch.  The bony surfaces were prepared for cementing by irrigating thoroughly with bacitracin saline solution. A bone plug was fashioned from some of the bone that had been removed previously and used to plug the distal femoral canal. In addition, 20 cc of Exparel diluted out to 60 cc with normal saline and 30 cc of 0.5% Sensorcaine were injected into the postero-medial and postero-lateral aspects of the knee, the medial and lateral gutter regions, and the peri-incisional tissues to help with postoperative analgesia. Meanwhile, the cement was being mixed on the back table. When it was ready, the tibial tray was cemented in first. The excess cement was removed using [employer] fast streamer. Next, the femoral component was impacted into place. Again, the excess cement was removed using [employer] fast streamer. The 10 mm trial insert was positioned and the knee brought into extension while the cement hardened. Finally, the patella was cemented into place and secured using the patellar clamp. Again, the excess cement was removed using [employer] fast streamer. Once the cement had hardened, the knee was placed through a range of motion with the findings as described above. Therefore, the trial insert was removed and, after verifying that no cement had been retained posteriorly, the permanent insert was positioned and secured using the appropriate key locking mechanism. Again the knee was placed through a range of motion with the findings as described above.  The wound was copiously  irrigated with bacitracin saline solution using the jet lavage system before the quadriceps tendon and retinacular layer were reapproximated using #0 Vicryl interrupted sutures. The superficial retinacular layer was closed using 2-0 Vicryl interrupted sutures in several layers for the skin was closed using staples. A sterile honeycomb dressing was applied to the skin before the leg was wrapped with an Ace wrap to accommodate the polar pack. The patient was then awakened and returned to the recovery room in satisfactory condition after tolerating the procedure well.

## 2016-03-21 NOTE — Plan of Care (Signed)
CPM removed

## 2016-03-21 NOTE — Plan of Care (Signed)
Patient has been complaining of slight headache which has been increasing since about 1hr after coming to floor from OR.  Patient BP has been higher than baseline so metoprolol was given.  Patient states that her Knee is not really hurting and pain is under control except for her head.  Ortho Dr. Ree Kida of continued situation and is placing consult for Hospitalist and also requesting anesthesia to round to assess for possible spinal HA.

## 2016-03-21 NOTE — NC FL2 (Signed)
[place] LEVEL OF CARE SCREENING TOOL     IDENTIFICATION  Patient Name: Erika Crawford Birthdate: [birth year] Sex: female Admission Date (Current Location): 03/21/2016  [facility] and [place] Number:  Selena Lesser  ([id number]) Facility and Address:  [facility], [address] [place], [place] [address]      Provider Number: [id number]  Attending Physician Name and Address:  Corky Mull, MD  Relative Name and Phone Number:       Current Level of Care: Hospital Recommended Level of Care: [place] Prior Approval Number:    Date Approved/Denied:   PASRR Number:  ([id number])  Discharge Plan: SNF    Current Diagnoses: Patient Active Problem List   Diagnosis Date Noted  . Status post total knee replacement using cement, 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    Orientation RESPIRATION BLADDER Height & Weight     Self, Time, Situation, Place  Normal External catheter Weight: 220 lb (99.8 kg) Height:     BEHAVIORAL SYMPTOMS/MOOD NEUROLOGICAL BOWEL NUTRITION STATUS   (None.)  (None.) Continent Diet (Diet: Clear Liquid )  AMBULATORY STATUS COMMUNICATION OF NEEDS Skin   Extensive Assist Verbally Surgical wounds (Incision: Left Knee)                       Personal Care Assistance Level of Assistance  Bathing, Feeding, Dressing Bathing Assistance: Limited assistance Feeding assistance: Independent Dressing Assistance: Limited assistance     Functional Limitations Info  Sight, Hearing, Speech Sight Info: Adequate Hearing Info: Adequate Speech Info: Adequate    SPECIAL CARE FACTORS FREQUENCY  PT (By licensed PT), OT (By licensed OT)     PT Frequency:  (5) OT Frequency:  (5)            Contractures      Additional Factors Info  Code Status, Allergies Code Status Info:  (Full Code) Allergies Info:  (Celebrex Celecoxib, Codeine, Etodolac, Hydrochlorothiazide, Oxycodone, Pravastatin, Relafen Nabumetone, Tramadol)            Current Medications (03/21/2016):  This is the current hospital active medication list Current Facility-Administered Medications  Medication Dose Route Frequency Provider Last Rate Last Dose  . acetaminophen (TYLENOL) tablet 650 mg  650 mg Oral Q6H PRN Corky Mull, MD       Or  . acetaminophen (TYLENOL) suppository 650 mg  650 mg Rectal Q6H PRN Corky Mull, MD      . acetaminophen (TYLENOL) tablet 1,000 mg  1,000 mg Oral Q6H Corky Mull, MD   1,000 mg at 03/21/16 1222  . bisacodyl (DULCOLAX) suppository 10 mg  10 mg Rectal Daily PRN Corky Mull, MD      . calcium-vitamin D (OSCAL WITH D) 500-200 MG-UNIT per tablet 1 tablet  1 tablet Oral BID Corky Mull, MD   1 tablet at 03/21/16 1222  . ceFAZolin (ANCEF) IVPB 2g/100 mL premix  2 g Intravenous Q6H Corky Mull, MD   2 g at 03/21/16 1354  . dextrose 5 % and 0.9 % NaCl with KCl 20 mEq/L infusion   Intravenous Continuous Corky Mull, MD 100 mL/hr at 03/21/16 1353    . diphenhydrAMINE (BENADRYL) 12.5 MG/5ML elixir 12.5-25 mg  12.5-25 mg Oral Q4H PRN Corky Mull, MD      . docusate sodium (COLACE) capsule 100 mg  100 mg Oral BID Corky Mull, MD   100 mg at 03/21/16 1222  . [START ON 03/22/2016] enoxaparin (LOVENOX) injection  40 mg  40 mg Subcutaneous Q24H Corky Mull, MD      . fluticasone [facility]) 50 MCG/ACT nasal spray 1 spray  1 spray Each Nare Daily Corky Mull, MD   1 spray at 03/21/16 1354  . HYDROmorphone (DILAUDID) injection 1-2 mg  1-2 mg Intravenous Q2H PRN Corky Mull, MD   2 mg at 03/21/16 1308  . irbesartan (AVAPRO) tablet 300 mg  300 mg Oral Daily Corky Mull, MD   300 mg at 03/21/16 1222  . [START ON 03/22/2016] levothyroxine (SYNTHROID, LEVOTHROID) tablet 100 mcg  100 mcg Oral QAC breakfast Corky Mull, MD      . magnesium hydroxide (MILK OF MAGNESIA) suspension 30 mL  30 mL Oral Daily PRN Corky Mull, MD      . metoCLOPramide (REGLAN) tablet 5-10 mg  5-10 mg Oral Q8H PRN Corky Mull, MD       Or  . metoCLOPramide  (REGLAN) injection 5-10 mg  5-10 mg Intravenous Q8H PRN Corky Mull, MD      . mirtazapine (REMERON) tablet 7.5 mg  7.5 mg Oral QHS Corky Mull, MD      . ondansetron [facility]) tablet 4 mg  4 mg Oral Q6H PRN Corky Mull, MD       Or  . ondansetron [facility]) injection 4 mg  4 mg Intravenous Q6H PRN Corky Mull, MD      . oxyCODONE (Oxy IR/ROXICODONE) immediate release tablet 5-10 mg  5-10 mg Oral Q3H PRN Corky Mull, MD   10 mg at 03/21/16 1222  . pantoprazole (PROTONIX) EC tablet 40 mg  40 mg Oral BID Corky Mull, MD   40 mg at 03/21/16 1222  . sodium phosphate (FLEET) 7-19 GM/118ML enema 1 enema  1 enema Rectal Once PRN Corky Mull, MD         Discharge Medications: Please see discharge summary for a list of discharge medications.  Relevant Imaging Results:  Relevant Lab Results:   Additional Information  (SSN: [id number])  Danie Chandler, Student-Social Work

## 2016-03-21 NOTE — Evaluation (Signed)
Physical Therapy Evaluation Patient Details Name: Erika Crawford MRN: [id number] DOB: 1941/04/22 Today's Date: 03/21/2016   History of Present Illness  Pt. admitted to [facility] s/p L TKA. hx: HTN, sleep apnea, hypothyroid. WBAT   Clinical Impression  Pt. Supine in bed upon arrival, oriented to self. Pt. Appears to be in discomfort at rest, reports she feels "swimmyheaded" continued to repeat "my head" throughout session, ultimately limiting pt's tolerance to activity within session. Pt. Demonstrates B UE strength WFL, generalized LE weakness strength RLE grossly 4/5 LLE grossly 3-/5 sensation intact. Pt. Able to perform supine LLE exercises primarily with AAROM. 13-64 degrees L knee ext/flexion. Pt. Performs supine <>sit bed mobility with mod A primarily for LLE support/negotiation and positioning. Pt. Tolerated <55minute of sitting EOB with B UE BLE support, reports "my head " unable to progress mobility further during today's session. Vitals monitored throughout: prior to treatment spO297%, HR 96, BP 162/77 following sitting activity spO2 96% HR 104 BP 167/62. Would benefit from skilled PT to address above deficits and promote optimal return to PLOF Recommend SNF placement upon d/c to follow up with further skilled PT needs. Will progress mobility as able.     Follow Up Recommendations SNF    Equipment Recommendations       Recommendations for Other Services       Precautions / Restrictions Precautions Precautions: Fall Required Braces or Orthoses:  (Pt. unable to perform SLR x10, L knee immobilizer indicated however pt. unable to perform further mobility during today's session so it was not used. ) Restrictions Weight Bearing Restrictions: Yes (WBAT)      Mobility  Bed Mobility Overal bed mobility: Needs Assistance Bed Mobility: Supine to Sit;Sit to Supine     Supine to sit: Mod assist Sit to supine: Mod assist   General bed mobility comments: Pt. able to assit with trunk  movements with support from bedrails and HOB elevated, requires assit for support/negotiation of LLE and positioning at EOB as well as with return to supine  Transfers Overall transfer level:  (Pt. unable to tolerate )                  Ambulation/Gait Ambulation/Gait assistance:  (pt. unable to tolerate)              Stairs            Wheelchair Mobility    Modified Rankin (Stroke Patients Only)       Balance Overall balance assessment: Needs assistance Sitting-balance support: Bilateral upper extremity supported;Feet supported Sitting balance-Leahy Scale: Fair     Standing balance support:  (unable to tolerate standing, not assessed)                                 Pertinent Vitals/Pain Pain Assessment: Faces Pain Score: 2  Faces Pain Scale: Hurts a little bit Pain Location: Pt. grimaces in pain with L LE movements, primarily complains of head feeling funny  Pain Intervention(s): Monitored during session;Ice applied;Repositioned    Home Living Family/patient expects to be discharged to:: Private residence Living Arrangements: Alone Available Help at Discharge:  (Pt. states daughters live in the area, unclear how much assist they are able to provide) Type of Home: House Home Access: Level entry     Home Layout: One level Home Equipment: Cane - single point;Walker - 4 wheels      Prior Function Level of Independence: Independent with assistive  device(s)         Comments: Pt. reports she used [facility] prior to elective surgery, able to perform all ADL's, lives alone      Hand Dominance        Extremity/Trunk Assessment   Upper Extremity Assessment: Overall WFL for tasks assessed           Lower Extremity Assessment: Generalized weakness (Pt. demonstrates grossly 4/5 strength in RLE and grossly 3-/5 strength in LLE, sensation intact)         Communication   Communication: No difficulties  Cognition Arousal/Alertness:  Lethargic Behavior During Therapy: WFL for tasks assessed/performed Overall Cognitive Status: Difficult to assess                      General Comments      Exercises Total Joint Exercises Goniometric ROM: 13-64 degrees L knee ext/flexion  Other Exercises Other Exercises: supine LLE exercises for AROM and strengtheningx10; ankle pumps, hip abd/add (AAROM), heel slides (AAROM), SAQ (AAROM), SLR (AAROM) quad sets.    Assessment/Plan    PT Assessment Patient needs continued PT services  PT Problem List Decreased balance;Decreased strength;Decreased range of motion;Decreased mobility;Decreased activity tolerance;Decreased knowledge of use of DME;Decreased knowledge of precautions;Decreased safety awareness;Pain          PT Treatment Interventions DME instruction;Gait training;Functional mobility training;Therapeutic activities;Therapeutic exercise;Balance training;Neuromuscular re-education;Patient/family education    PT Goals (Current goals can be found in the Care Plan section)  Acute Rehab PT Goals PT Goal Formulation: Patient unable to participate in goal setting    Frequency BID   Barriers to discharge Decreased caregiver support lives alone    Co-evaluation               End of Session Equipment Utilized During Treatment: Gait belt Activity Tolerance: Patient limited by lethargy;Patient limited by pain (Pt. reports she is feeling "swimmy headed" and could not tolerate sitting EOB >1 minute) Patient left: in bed;with call bell/phone within reach;with SCD's reapplied;with bed alarm set;in CPM (cryocuff applied) Nurse Communication: Other (comment);Mobility status (vital signs )         Time: 1430-1500 PT Time Calculation (min) (ACUTE ONLY): 30 min   Charges:         PT G Codes:         Melanie Crazier, SPT  03/21/16,3:58 PM

## 2016-03-21 NOTE — Plan of Care (Signed)
Placed CPM at 34.

## 2016-03-21 NOTE — Clinical Social Work Placement (Signed)
   CLINICAL SOCIAL WORK PLACEMENT  NOTE  Date:  03/21/2016  Patient Details  Name: Erika Crawford MRN: [id number] Date of Birth: [birth year]  Clinical Social Work is seeking post-discharge placement for this patient at the [place] level of care (*CSW will initial, date and re-position this form in  chart as items are completed):  Yes   Patient/family provided with [facility] Work Department's list of facilities offering this level of care within the geographic area requested by the patient (or if unable, by the patient's family).  Yes   Patient/family informed of their freedom to choose among providers that offer the needed level of care, that participate in Medicare, Medicaid or managed care program needed by the patient, have an available bed and are willing to accept the patient.      Patient/family informed of [facility]'s ownership interest in [facility] and [facility], as well as of the fact that they are under no obligation to receive care at these facilities.  PASRR submitted to EDS on       PASRR number received on       Existing PASRR number confirmed on 03/21/16     FL2 transmitted to all facilities in geographic area requested by pt/family on 03/21/16     FL2 transmitted to all facilities within larger geographic area on       Patient informed that his/her managed care company has contracts with or will negotiate with certain facilities, including the following:            Patient/family informed of bed offers received.  Patient chooses bed at       Physician recommends and patient chooses bed at      Patient to be transferred to   on  .  Patient to be transferred to facility by       Patient family notified on   of transfer.  Name of family member notified:        PHYSICIAN       Additional Comment:    _______________________________________________ Mae Cianci, Veronia Beets, LCSW 03/21/2016, 3:27 PM

## 2016-03-21 NOTE — Transfer of Care (Signed)
Immediate Anesthesia Transfer of Care Note  Patient: Erika Crawford  Procedure(s) Performed: Procedure(s): TOTAL KNEE ARTHROPLASTY (Left)  Patient Location: PACU  Anesthesia Type:Spinal  Level of Consciousness: awake, alert  and oriented  Airway & Oxygen Therapy: Patient Spontanous Breathing and Patient connected to nasal cannula oxygen  Post-op Assessment: Report given to RN and Post -op Vital signs reviewed and stable  Post vital signs: Reviewed and stable  Last Vitals:  Vitals:   03/21/16 0637 03/21/16 1011  BP: (!) 171/75   Pulse: 92   Resp: 18   Temp: 36.7 C (!) 36.1 C    Last Pain:  Vitals:   03/21/16 1012  TempSrc: Temporal         Complications: No apparent anesthesia complications

## 2016-03-22 ENCOUNTER — Encounter: Payer: Self-pay | Admitting: Surgery

## 2016-03-22 LAB — CBC WITH DIFFERENTIAL/PLATELET
Basophils Absolute: 0 10*3/uL (ref 0–0.1)
Basophils Relative: 0 %
EOS ABS: 0 10*3/uL (ref 0–0.7)
EOS PCT: 0 %
HCT: 35 % (ref 35.0–47.0)
Hemoglobin: 11.9 g/dL — ABNORMAL LOW (ref 12.0–16.0)
LYMPHS ABS: 1.6 10*3/uL (ref 1.0–3.6)
Lymphocytes Relative: 11 %
MCH: 32.6 pg (ref 26.0–34.0)
MCHC: 34 g/dL (ref 32.0–36.0)
MCV: 95.6 fL (ref 80.0–100.0)
MONOS PCT: 6 %
Monocytes Absolute: 0.9 10*3/uL (ref 0.2–0.9)
Neutro Abs: 11.6 10*3/uL — ABNORMAL HIGH (ref 1.4–6.5)
Neutrophils Relative %: 83 %
PLATELETS: 166 10*3/uL (ref 150–440)
RBC: 3.66 MIL/uL — ABNORMAL LOW (ref 3.80–5.20)
RDW: 13.9 % (ref 11.5–14.5)
WBC: 14.1 10*3/uL — ABNORMAL HIGH (ref 3.6–11.0)

## 2016-03-22 LAB — BASIC METABOLIC PANEL
Anion gap: 7 (ref 5–15)
BUN: 11 mg/dL (ref 6–20)
CHLORIDE: 108 mmol/L (ref 101–111)
CO2: 24 mmol/L (ref 22–32)
CREATININE: 0.7 mg/dL (ref 0.44–1.00)
Calcium: 8.4 mg/dL — ABNORMAL LOW (ref 8.9–10.3)
GFR calc Af Amer: >60 mL/min (ref >60)
GFR calc non Af Amer: >60 mL/min (ref >60)
GLUCOSE: 144 mg/dL — AB (ref 65–99)
Potassium: 4.4 mmol/L (ref 3.5–5.1)
SODIUM: 139 mmol/L (ref 135–145)

## 2016-03-22 MED ORDER — ONDANSETRON HCL 4 MG PO TABS: 4.0000 mg | ORAL_TABLET | Freq: Four times a day (QID) | ORAL | 0 refills | Status: DC | PRN

## 2016-03-22 MED ORDER — ENOXAPARIN SODIUM 40 MG/0.4ML ~~LOC~~ SOLN: 40.0000 mg | SUBCUTANEOUS | 0 refills | Status: DC

## 2016-03-22 MED ORDER — OXYCODONE HCL 5 MG PO TABS
5.0000 mg | ORAL_TABLET | ORAL | 0 refills | Status: DC | PRN
Start: 2016-03-22 — End: 2016-03-23

## 2016-03-22 MED ORDER — TRAMADOL HCL 50 MG PO TABS
50.0000 mg | ORAL_TABLET | Freq: Four times a day (QID) | ORAL | Status: DC | PRN
  Administered 2016-03-22: 100 mg via ORAL
  Administered 2016-03-22 (×2): 50 mg via ORAL
  Administered 2016-03-23 – 2016-03-24 (×2): 100 mg via ORAL
  Filled 2016-03-22: qty 2
  Filled 2016-03-22: qty 1
  Filled 2016-03-22: qty 2
  Filled 2016-03-22: qty 1
  Filled 2016-03-22 (×2): qty 2

## 2016-03-22 NOTE — Progress Notes (Signed)
Pt pain not easy to control today,also has complaints of HA. consequently her CPM was not applied.

## 2016-03-22 NOTE — Progress Notes (Signed)
Physical Therapy Treatment Patient Details Name: Erika Crawford MRN: [id number] DOB: Oct 28, 1940 Today's Date: 03/22/2016    History of Present Illness Pt. admitted to [facility] s/p L TKA. hx: HTN, sleep apnea, hypothyroid. WBAT     PT Comments    Pt at this time needs minimal assistance with initiation for L LE SLR's and heel slides. L knee AROM from 5 - 70 degrees. Pt able to complete bed mobility min guard with increased dizziness that resolves within 2 minutes. Pt transfered sit to stand +1 min assist initially and CGA to stand in place with increased dizziness that required a seated rest break. Pt then transfered +2 min guard for safety via stand pivot bed to chair. Pt reporting increased headache to 10/10 pain and dizziness. No significant changes in BP noted or other vitals (see below for details). RN consulted about symptoms and pt was returned to supine in bed. Pt at end of session reporting improvement with headache and dizziness. Pt will benefit from progressing therapeutic exercises and ambulation distance as medically appropriate.    Follow Up Recommendations  SNF     Equipment Recommendations  Rolling walker with 5" wheels    Recommendations for Other Services       Precautions / Restrictions Precautions Precautions: Fall Restrictions Weight Bearing Restrictions: Yes LLE Weight Bearing: Weight bearing as tolerated    Mobility  Bed Mobility Overal bed mobility: Needs Assistance Bed Mobility: Supine to Sit;Sit to Supine     Supine to sit: Min guard (min guard of L LE and sitting EOB; increased time to complete) Sit to supine: Min assist;+2 for physical assistance (due to pt HA extra assistance provided for safe/quick transfer)   General bed mobility comments: Pt +1 min assist to scoot to EOB; slight increase in dizziness which resolved in approximately 2 minutes  Transfers Overall transfer level: Needs assistance Equipment used: Rolling walker (2  wheeled) Transfers: Sit to/from [facility] Sit to Stand: Min assist Stand pivot transfers: Min guard;+2 safety/equipment       General transfer comment: x2 trials for each transfer; Pt sit to stand very flexed forward with min vc's required for hand placement to better assist with transfer; Pt only able to take a few shuffle steps to pivot from bed to chair each time; +2 for safety due to increasing dizziness and HA; Pt HA not improving reclined in chair and pt returned to bed when HA and dizziness began improving  Ambulation/Gait             General Gait Details: not able at this time   Stairs            Wheelchair Mobility    Modified Rankin (Stroke Patients Only)       Balance Overall balance assessment: Needs assistance Sitting-balance support: Feet supported;Bilateral upper extremity supported Sitting balance-Leahy Scale: Fair Sitting balance - Comments: min guard for safety due to dizziness and flexed posture; improved sitting posture as dizziness decreased   Standing balance support: Bilateral upper extremity supported Standing balance-Leahy Scale: Fair Standing balance comment: No LOB; +2 min guard for safety due to increasing dizziness and pain in head                    Cognition Arousal/Alertness: Awake/alert Behavior During Therapy: WFL for tasks assessed/performed Overall Cognitive Status: Within Functional Limits for tasks assessed  Exercises Total Joint Exercises Ankle Circles/Pumps: AROM;Strengthening;Both;10 reps;Supine Quad Sets: AROM;Strengthening;Both;10 reps;Supine Towel Squeeze: AROM;Strengthening;Both;10 reps;Supine Heel Slides: AROM;Strengthening;Both;10 reps;Supine (initial assistance with L LE for 1st 3 reps; L LE through lesser AROM) Hip ABduction/ADduction: AROM;Strengthening;Left;10 reps;Supine Straight Leg Raises: AROM;Strengthening;Both;10 reps;Supine (Pt initially requiring min  assist with L LE; improved to guarding only) Knee Flexion: AROM;Strengthening;10 reps;Left;Seated Goniometric ROM: L knee extension 5 short from neutral (AROM); L knee flexion to 70 degrees (AROM) Other Exercises Other Exercises: B LE marching in seated position (through decreased range)     General Comments General comments (skin integrity, edema, etc.): Pt is agreeable to PT session.       Pertinent Vitals/Pain Pain Assessment: 0-10 Pain Score: 10-Worst pain ever (0/10 HA at rest, 10/10 HA with activity; 7/10 L knee pain) Pain Location: Headache through front to back of head with standing/upright sitting Pain Descriptors / Indicators: Headache;Sore Pain Intervention(s): Limited activity within patient's tolerance;Monitored during session;Premedicated before session;Repositioned (RN consulted and notified of sx)  Sitting BP (after stand pivot transfer) 140/58; sitting after approximately 3-4 minutes (138/54) HR at rest 68 bpm; HR 89 bpm with activity O2 greater than or equal to 96% throughout sesession    Home Living                      Prior Function            PT Goals (current goals can now be found in the care plan section) Acute Rehab PT Goals PT Goal Formulation: With patient Progress towards PT goals: Progressing toward goals    Frequency    BID      PT Plan Current plan remains appropriate    Co-evaluation             End of Session Equipment Utilized During Treatment: Gait belt Activity Tolerance: Patient limited by pain Patient left: in bed;with call bell/phone within reach;with bed alarm set;with SCD's reapplied (polar care (applied and activated); foot pumps (applied and activated); B towel rolls with heels suspended; RN in room with pt to monitor symptoms)     Time: [id number] PT Time Calculation (min) (ACUTE ONLY): 61 min  Charges:                       G Codes:      Riley Nearing, SPT 03/22/2016, 12:43 PM

## 2016-03-22 NOTE — Care Management (Signed)
Patient with TKA.  Physical therapy is recommending skilled nursing placement.  CSW involved and bed search initiated

## 2016-03-22 NOTE — Discharge Instructions (Signed)

## 2016-03-22 NOTE — Anesthesia Postprocedure Evaluation (Signed)
Anesthesia Post Note  Patient: Erika Crawford  Procedure(s) Performed: Procedure(s) (LRB): TOTAL KNEE ARTHROPLASTY (Left)  Patient location during evaluation: Nursing Unit Anesthesia Type: Spinal Level of consciousness: awake, awake and alert, oriented and patient cooperative Pain management: satisfactory to patient Vital Signs Assessment: post-procedure vital signs reviewed and stable Respiratory status: spontaneous breathing, nonlabored ventilation and respiratory function stable Cardiovascular status: stable Postop Assessment: spinal receding, patient able to bend at knees, no signs of nausea or vomiting and adequate PO intake Anesthetic complications: no    Last Vitals:  Vitals:   03/22/16 0012 03/22/16 0418  BP: 138/65 (!) 136/57  Pulse: 67 67  Resp: 18 19  Temp: 36.8 C 37.2 C    Last Pain:  Vitals:   03/22/16 0559  TempSrc:   PainSc: 2                  Silvana Newness A

## 2016-03-22 NOTE — Progress Notes (Signed)
Physical Therapy Treatment Patient Details Name: Erika Crawford MRN: [id number] DOB: May 28, 1941 Today's Date: 03/22/2016    History of Present Illness Pt. admitted to [facility] s/p L TKA. hx: HTN, sleep apnea, hypothyroid. WBAT     PT Comments    Pt is anxious today to participate with PT and refusing OOB activities due to reported 10/10 L LE pain (thigh to foot) and R dorsal foot pain (RN aware and notified). Pt agreeable to therapeutic exercises in bed with AAROM for most provided to prevent aggravating the pain. At end of session pt rated L LE pain 7-8/10 and was overall less anxious with the experience.    Follow Up Recommendations  SNF     Equipment Recommendations  Rolling walker with 5" wheels    Recommendations for Other Services       Precautions / Restrictions Precautions Precautions: Fall Precaution Comments: Has TKR HEP/precaution packet Restrictions Weight Bearing Restrictions: Yes LLE Weight Bearing: Weight bearing as tolerated    Mobility  Bed Mobility    General bed mobility comments: Pt refused all OOB activity  Transfers          Ambulation/Gait                Stairs            Wheelchair Mobility    Modified Rankin (Stroke Patients Only)       Balance                             Cognition Arousal/Alertness: Awake/alert Behavior During Therapy: Anxious Overall Cognitive Status: Within Functional Limits for tasks assessed                      Exercises Total Joint Exercises Ankle Circles/Pumps: AROM;Strengthening;Both;10 reps;Supine Quad Sets: AROM;Strengthening;Left;10 reps;Supine (min tactile cues to help) Short Arc Quad: AAROM;Strengthening;Left;10 reps;Supine Heel Slides: AAROM;Strengthening;Left;10 reps;Supine (through limited ROM due to pain) Hip ABduction/ADduction: AAROM;Strengthening;Left;10 reps;Supine Straight Leg Raises: AAROM;Strengthening;Left;10 reps;Supine   General Comments  General comments (skin integrity, edema, etc.): Pt is agreeable to PT session.       Pertinent Vitals/Pain Pain Assessment: 0-10 Pain Score: 10-Worst pain ever (9-10/10 at rest; 7-8/10 at end of session) Pain Location: Entire L LE (pt reports that thigh is painful to touch and foot pain); R foot pain on dorsal aspect (not to touch, to active DF only) Pain Descriptors / Indicators: Headache;Sore Pain Intervention(s): Limited activity within patient's tolerance;Monitored during session;Premedicated before session;Repositioned (Pt reports that RN removed foot pumps due to B foot pain and foot pumps were left off at end of session)    Home Living                      Prior Function            PT Goals (current goals can now be found in the care plan section) Acute Rehab PT Goals PT Goal Formulation: With patient Progress towards PT goals: Progressing toward goals    Frequency    BID      PT Plan Current plan remains appropriate    Co-evaluation             End of Session Equipment Utilized During Treatment: Gait belt Activity Tolerance: Patient tolerated treatment well Patient left: in bed;with call bell/phone within reach;with bed alarm set (polar care (applied and activated); B towel rolls with heels suspended)  Time: [id number] PT Time Calculation (min) (ACUTE ONLY): 16 min  Charges:  $Therapeutic Exercise: 23-37 mins $Therapeutic Activity: 23-37 mins                    G Codes:      Riley Nearing, SPT 03/22/2016, 4:10 PM

## 2016-03-22 NOTE — Progress Notes (Signed)
Pt complaining of HA. She states the pain is at the top of her head and noticed shortly after she got her dilaudid injection this AM but got worse when she stood up and sat in the chair after PT session.  Vital signs are stable she denies blurred vision or any other symptoms. Dr Roland Rack made aware orders for ultram given. Pt states she has taking this before and denies having an allergy to this med.

## 2016-03-22 NOTE — Discharge Summary (Signed)
Physician Discharge Summary  Patient ID: Erika Crawford MRN: [id number] DOB/AGE: [birth year] 75 y.o.  Admit date: 03/21/2016 Discharge date: 03/24/2016 Admission Diagnoses:  Primary Osteoarthritis of left knee Primary Osteoarthritis Degenerative joint disease, left knee  Discharge Diagnoses: Patient Active Problem List   Diagnosis Date Noted  . 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 Arthritis   . Complication of anesthesia    dizziness  . Hypertension   . Hypothyroidism   . Sleep apnea    cpap  . Thyroid disease      Transfusion: None   Consultants (if any):   Discharged Condition: Improved  Hospital Course: Erika Crawford is an 75 y.o. female who was admitted 03/21/2016 with a diagnosis of left knee degenerative joint disease and went to the operating room on 03/21/2016 and underwent the above named procedures.    Surgeries: Procedure(s): TOTAL KNEE ARTHROPLASTY on 03/21/2016 Patient tolerated the surgery well. Taken to PACU where she was stabilized and then transferred to the orthopedic floor.  Started on Lovenox 40mg  q 24 hrs. Foot pumps applied bilaterally at 80 mm. Heels elevated on bed with rolled towels. No evidence of DVT. Negative Homan. Physical therapy started on day #1 for gait training and transfer. OT started day #1 for ADL and assisted devices.  Patient's IV and Foley were removed on POD1. Patient tolerated PT well. Pain controlled with Nucynta and ultram. POD #3 VS and labs stable. Patient medically stable for discharge to SNF.  Implants: Left TKA using all-cemented [employer] Vanguard system with a 67.5 mm mm PCR femur, a 71 mm tibial tray with a 10 mm E-poly insert, and a 34 x 8.5 mm all-poly 3-pegged domed patella.  She was given perioperative antibiotics:  Anti-infectives    Start     Dose/Rate Route Frequency Ordered Stop   03/21/16 1300   ceFAZolin (ANCEF) IVPB 2g/100 mL premix     2 g 200 mL/hr over 30 Minutes Intravenous Every 6 hours 03/21/16 1131 03/22/16 0110   03/21/16 0551  ceFAZolin (ANCEF) 2-4 GM/100ML-% IVPB    Comments:  Erika Crawford: cabinet override      03/21/16 0551 03/21/16 0755   03/21/16 0030  ceFAZolin (ANCEF) IVPB 2g/100 mL premix     2 g 200 mL/hr over 30 Minutes Intravenous  Once 03/21/16 0024 03/21/16 0755    .  She was given sequential compression devices, early ambulation, and Lovenox for DVT prophylaxis.  She benefited maximally from the hospital stay and there were no complications.    Recent vital signs:  Vitals:   03/23/16 0418 03/23/16 0803  BP: (!) 124/59 (!) 125/56  Pulse: 64 66  Resp: 16 18  Temp: 98.1 F (36.7 C) 98.4 F (36.9 C)    Recent laboratory studies:  Lab Results  Component Value Date   HGB 10.8 (L) 03/23/2016   HGB 11.9 (L) 03/22/2016   HGB 12.8 03/13/2016   Lab Results  Component Value Date   WBC 10.3 03/23/2016   PLT 142 (L) 03/23/2016   Lab Results  Component Value Date   INR 0.97 03/13/2016   Lab Results  Component Value Date   NA 141 03/23/2016   K 4.0 03/23/2016   CL 110 03/23/2016   CO2 25 03/23/2016   BUN 15 03/23/2016   CREATININE 0.66 03/23/2016   GLUCOSE 97 03/23/2016    Discharge Medications:     Medication  List    TAKE these medications   acetaminophen 500 MG tablet Commonly known as:  TYLENOL Take 500 mg by mouth every 6 (six) hours as needed.   alendronate 70 MG tablet Commonly known as:  FOSAMAX Take 70 mg by mouth once a week. Take with a full glass of water on an empty stomach.   CALTRATE 600+D 600-400 MG-UNIT tablet Generic drug:  Calcium Carbonate-Vitamin D Take 1 tablet by mouth 2 (two) times daily.   diclofenac 50 MG EC tablet Commonly known as:  VOLTAREN Take 50 mg by mouth 2 (two) times daily.   diclofenac sodium 1 % Gel Commonly known as:  VOLTAREN Apply topically 3 (three) times daily.   enoxaparin 40  MG/0.4ML injection Commonly known as:  LOVENOX Inject 0.4 mLs (40 mg total) into the skin daily.   levothyroxine 100 MCG tablet Commonly known as:  SYNTHROID, LEVOTHROID Take 100 mcg by mouth daily before breakfast.   mirtazapine 15 MG tablet Commonly known as:  REMERON Take 7.5 mg by mouth at bedtime.   mometasone 50 MCG/ACT nasal spray Commonly known as:  NASONEX Place 2 sprays into the nose daily.   omeprazole 20 MG capsule Commonly known as:  PRILOSEC Take 20 mg by mouth daily.   ondansetron 4 MG tablet Commonly known as:  ZOFRAN Take 1 tablet (4 mg total) by mouth every 6 (six) hours as needed for nausea or vomiting.   tapentadol 50 MG tablet Commonly known as:  NUCYNTA Take 1 tablet (50 mg total) by mouth every 4 (four) hours as needed for moderate pain.   traMADol 50 MG tablet Commonly known as:  ULTRAM Take 1-2 tablets (50-100 mg total) by mouth every 6 (six) hours as needed for moderate pain.   valsartan 320 MG tablet Commonly known as:  DIOVAN Take 320 mg by mouth daily.   VITAMIN D (ERGOCALCIFEROL) PO Take by mouth 2 (two) times daily.       Diagnostic Studies: Dg Chest 2 View  Result Date: 03/13/2016 CLINICAL DATA:  Pre-op evaluation for knee replacement scheduled for Oct. 5th, 2017. Patient states she has a HX of HTN, complications with anesthesia, and sleep apnea, EXAM: CHEST  2 VIEW COMPARISON:  10/12/2014 FINDINGS: Cardiac silhouette is top-normal in size. No mediastinal or hilar masses or evidence of adenopathy. Clear lungs.  No pleural effusion or pneumothorax. Skeletal structures are demineralized. There is a moderate old compression fracture of the upper lumbar spine treated previously with vertebroplasty. IMPRESSION: No active cardiopulmonary disease. Electronically Signed   By: Lajean Manes M.D.   On: 03/13/2016 15:42   Dg Knee Left Port  Result Date: 03/21/2016 CLINICAL DATA:  Status post left total knee arthroplasty. EXAM: PORTABLE LEFT KNEE -  1-2 VIEW COMPARISON:  Radiographs of January 15, 2014. FINDINGS: The femoral and tibial components are well situated. No fracture or dislocation is noted. Expected postoperative changes are seen anteriorly. IMPRESSION: Status post left total knee arthroplasty. Electronically Signed   By: Marijo Conception, M.D.   On: 03/21/2016 10:45    Disposition:  Pt currently POD1, doing well.  Plan will be for discharge tomorrow or Sunday (03/24/16), possibly to SNF pending PT and bowel movement.  Follow-up Information    Judson Roch, PA-C Follow up in 14 day(s).   Specialty:  Physician Assistant Why:  Electa Sniff information: [place] [id number] [phone]          Signed: Feliberto Gottron  PA-C 03/23/2016, 9:02 AM

## 2016-03-22 NOTE — Progress Notes (Signed)
Subjective: 1 Day Post-Op Procedure(s) (LRB): TOTAL KNEE ARTHROPLASTY (Left) Patient reports pain as mild.   Patient is well, and has had no acute complaints or problems Plan is to go Skilled nursing facility after hospital stay. Negative for chest pain and shortness of breath Fever: no Gastrointestinal:Negative for nausea and vomiting this AM.  Objective: Vital signs in last 24 hours: Temp:  [96.9 F (36.1 C)-99 F (37.2 C)] 99 F (37.2 C) (10/06 0418) Pulse Rate:  [67-115] 67 (10/06 0418) Resp:  [13-19] 19 (10/06 0418) BP: (108-167)/(56-92) 136/57 (10/06 0418) SpO2:  [96 %-100 %] 97 % (10/06 0418) Weight:  [99.8 kg (220 lb 0.3 oz)] 99.8 kg (220 lb 0.3 oz) (10/05 1620)  Intake/Output from previous day:  Intake/Output Summary (Last 24 hours) at 03/22/16 0745 Last data filed at 03/22/16 0603  Gross per 24 hour  Intake             3645 ml  Output             2715 ml  Net              930 ml    Intake/Output this shift: No intake/output data recorded.  Labs:  Recent Labs  03/22/16 0405  HGB 11.9*    Recent Labs  03/22/16 0405  WBC 14.1*  RBC 3.66*  HCT 35.0  PLT 166    Recent Labs  03/22/16 0405  NA 139  K 4.4  CL 108  CO2 24  BUN 11  CREATININE 0.70  GLUCOSE 144*  CALCIUM 8.4*   No results for input(s): LABPT, INR in the last 72 hours.   EXAM General - Patient is Alert, Appropriate and Oriented Extremity - ABD soft Sensation intact distally Intact pulses distally Incision: scant drainage No cellulitis present Dressing/Incision - blood tinged drainage Motor Function - intact, moving foot and toes well on exam.   Abdomen soft without distention or tympany.  Past Medical History:  Diagnosis Date  . Arthritis   . Complication of anesthesia    dizziness  . Hypertension   . Hypothyroidism   . Sleep apnea    cpap  . Thyroid disease     Assessment/Plan: 1 Day Post-Op Procedure(s) (LRB): TOTAL KNEE ARTHROPLASTY (Left) Active  Problems:   Status post total knee replacement using cement, left  Estimated body mass index is 34.46 kg/m as calculated from the following:   Height as of this encounter: 5\' 7"  (1.702 m).   Weight as of this encounter: 99.8 kg (220 lb 0.3 oz). Advance diet Up with therapy D/C IV fluids when tolerating PO intake.  Pt had headaches yesterday, resolved this morning with no vision changes.  Pt believes from pain medication. Labs reviewed, stable, BMP and CBC ordered for tomorrow morning. N/V yesterday, resolved this AM. Continue with PT, currently recommending SNF. Care Management to assist with discharge.  DVT Prophylaxis - Lovenox, Foot Pumps and TED hose Weight-Bearing as tolerated to left leg  J. Cameron Proud, PA-C [facility] 03/22/2016, 7:45 AM

## 2016-03-23 ENCOUNTER — Encounter
Admission: RE | Admit: 2016-03-23 | Discharge: 2016-03-23 | Disposition: A | Discharge status: Home / Self Care | Payer: Medicare Other | Source: Ambulatory Visit | Attending: Internal Medicine | Admitting: Internal Medicine

## 2016-03-23 LAB — BASIC METABOLIC PANEL
Anion gap: 6 (ref 5–15)
BUN: 15 mg/dL (ref 6–20)
CALCIUM: 8.3 mg/dL — AB (ref 8.9–10.3)
CHLORIDE: 110 mmol/L (ref 101–111)
CO2: 25 mmol/L (ref 22–32)
CREATININE: 0.66 mg/dL (ref 0.44–1.00)
Glucose, Bld: 97 mg/dL (ref 65–99)
Potassium: 4 mmol/L (ref 3.5–5.1)
SODIUM: 141 mmol/L (ref 135–145)

## 2016-03-23 LAB — CBC
HCT: 31.1 % — ABNORMAL LOW (ref 35.0–47.0)
Hemoglobin: 10.8 g/dL — ABNORMAL LOW (ref 12.0–16.0)
MCH: 33.5 pg (ref 26.0–34.0)
MCHC: 34.7 g/dL (ref 32.0–36.0)
MCV: 96.6 fL (ref 80.0–100.0)
PLATELETS: 142 10*3/uL — AB (ref 150–440)
RBC: 3.23 MIL/uL — AB (ref 3.80–5.20)
RDW: 14.5 % (ref 11.5–14.5)
WBC: 10.3 10*3/uL (ref 3.6–11.0)

## 2016-03-23 MED ORDER — TAPENTADOL HCL 50 MG PO TABS
50.0000 mg | ORAL_TABLET | ORAL | Status: DC | PRN
  Administered 2016-03-23: 50 mg via ORAL
  Filled 2016-03-23: qty 1

## 2016-03-23 MED ORDER — HYDROMORPHONE HCL 1 MG/ML IJ SOLN
0.5000 mg | INTRAMUSCULAR | Status: DC | PRN
  Administered 2016-03-23 (×4): 1 mg via INTRAVENOUS
  Filled 2016-03-23 (×4): qty 1

## 2016-03-23 MED ORDER — LACTULOSE 10 GM/15ML PO SOLN
10.0000 g | Freq: Two times a day (BID) | ORAL | Status: DC
  Administered 2016-03-23: 10 g via ORAL
  Filled 2016-03-23 (×2): qty 30

## 2016-03-23 MED ORDER — TAPENTADOL HCL 50 MG PO TABS: 50.0000 mg | ORAL_TABLET | ORAL | 0 refills | Status: DC | PRN

## 2016-03-23 MED ORDER — TRAMADOL HCL 50 MG PO TABS: 50.0000 mg | ORAL_TABLET | Freq: Four times a day (QID) | ORAL | 0 refills | Status: DC | PRN

## 2016-03-23 MED ORDER — ENOXAPARIN SODIUM 40 MG/0.4ML ~~LOC~~ SOLN: 40.0000 mg | SUBCUTANEOUS | 0 refills | Status: DC

## 2016-03-23 NOTE — Progress Notes (Signed)
PT Cancellation Note  Patient Details Name: Erika Crawford MRN: [id number] DOB: 1941-02-26   Cancelled Treatment:    Reason Eval/Treat Not Completed: Patient declined, no reason specified Pt is adamant about no wanting to participate this AM.  She reports being very dizzy and refuses to even do light exercises in the bed or try to simply transfer to the bed.  Pt refuses and despite multiple attempt by PT to convince her to do just a little she is unwilling.    Kreg Shropshire 03/23/2016, 2:51 PM

## 2016-03-23 NOTE — Progress Notes (Signed)
Physical Therapy Treatment Patient Details Name: VALARI WHITLER MRN: [id number] DOB: October 08, 1940 Today's Date: 03/23/2016    History of Present Illness Pt. admitted to [facility] s/p L TKA. hx: HTN, sleep apnea, hypothyroid. WBAT     PT Comments    Pt shows good effort with exercises but does need constant encouragement and cuing secondary to fear and pain.  She struggled with ROM acts, but did gradually improve tolerance.  She was able to actually ambulate today and though she was anxious the entire time she was able to show good effort with much cuing and encouragement - pt very tired after the effort but proud of herself for actually doing it today.   Follow Up Recommendations  SNF     Equipment Recommendations  Rolling walker with 5" wheels    Recommendations for Other Services       Precautions / Restrictions Precautions Precautions: Fall Restrictions Weight Bearing Restrictions: Yes LLE Weight Bearing: Weight bearing as tolerated    Mobility  Bed Mobility Overal bed mobility: Needs Assistance Bed Mobility: Supine to Sit     Supine to sit: Min assist     General bed mobility comments: Pt needing encouragement and cuing to get to EOB, but overall did not need excessive assist  Transfers Overall transfer level: Needs assistance Equipment used: Rolling walker (2 wheeled) Transfers: Sit to/from Stand Sit to Stand: Min assist         General transfer comment: Pt needing some cuing to insure she feels confident and positions herself appropriately, but ultimately she is able to rise w/o heavy assist and was able to use the walker effectively to maintain balance  Ambulation/Gait Ambulation/Gait assistance: Min assist;Min guard Ambulation Distance (Feet): 35 Feet Assistive device: Rolling walker (2 wheeled)       General Gait Details: Pt is fearful initially, but once she took a few steps commented on how it's "Not too bad" she needed constant encouragment and cuing  to keep going and maintain some sort of cadence, and did much better than any previous attempts.  KI donned t/o the effort, no issues with buckling.  Pt very fatigued after the effort.    Stairs            Wheelchair Mobility    Modified Rankin (Stroke Patients Only)       Balance     Sitting balance-Leahy Scale: Good       Standing balance-Leahy Scale: Fair                      Cognition Arousal/Alertness: Awake/alert Behavior During Therapy: Anxious Overall Cognitive Status: Within Functional Limits for tasks assessed                      Exercises Total Joint Exercises Ankle Circles/Pumps: AROM;10 reps Quad Sets: Strengthening;10 reps Gluteal Sets: Strengthening;10 reps Short Arc Quad: AROM;AAROM;10 reps Heel Slides: AAROM;AROM;10 reps Hip ABduction/ADduction: 10 reps;AROM Knee Flexion: PROM;5 reps Goniometric ROM: 4-68    General Comments        Pertinent Vitals/Pain Pain Assessment: 0-10 Pain Score: 8  (reports only 2-3/10 at rest)    Home Living                      Prior Function            PT Goals (current goals can now be found in the care plan section) Progress towards PT goals: Progressing toward goals  Frequency    BID      PT Plan Current plan remains appropriate    Co-evaluation             End of Session Equipment Utilized During Treatment: Gait belt Activity Tolerance: Patient tolerated treatment well Patient left: with call bell/phone within reach;with chair alarm set     Time: [id number] PT Time Calculation (min) (ACUTE ONLY): 41 min  Charges:  $Gait Training: 8-22 mins $Therapeutic Exercise: 23-37 mins                    G Codes:      Kreg Shropshire, DPT 03/23/2016, 11:43 AM

## 2016-03-23 NOTE — Progress Notes (Signed)
Pt. In severe pain 10 out of 10. Dr. Rudene Christians notified and ordered to loosen dressing and give dilaudid 0.5-1mg  q1h prn

## 2016-03-23 NOTE — Progress Notes (Signed)
Pt. Rested after receiving dilaudid. Pt. Has no c/o headache after receiving med and sleeping peacefully at this time. Will continue to monitor.

## 2016-03-23 NOTE — Progress Notes (Addendum)
Subjective: 2 Days Post-Op Procedure(s) (LRB): TOTAL KNEE ARTHROPLASTY (Left) Patient reports pain as moderate.   Patient is patient unable to tolerate oxycodone. Nucynta added. Plan is to go Skilled nursing facility after hospital stay. Denies and chest pain, SOB, abdominal pain.  Objective: Vital signs in last 24 hours: Temp:  [98.1 F (36.7 C)-98.7 F (37.1 C)] 98.4 F (36.9 C) (10/07 0803) Pulse Rate:  [64-89] 66 (10/07 0803) Resp:  [16-20] 18 (10/07 0803) BP: (100-142)/(51-59) 125/56 (10/07 0803) SpO2:  [94 %-98 %] 94 % (10/07 0803)  Intake/Output from previous day:  Intake/Output Summary (Last 24 hours) at 03/23/16 0851 Last data filed at 03/22/16 1900  Gross per 24 hour  Intake              480 ml  Output              200 ml  Net              280 ml    Intake/Output this shift: No intake/output data recorded.  Labs:  Recent Labs  03/22/16 0405 03/23/16 0356  HGB 11.9* 10.8*    Recent Labs  03/22/16 0405 03/23/16 0356  WBC 14.1* 10.3  RBC 3.66* 3.23*  HCT 35.0 31.1*  PLT 166 142*    Recent Labs  03/22/16 0405 03/23/16 0356  NA 139 141  K 4.4 4.0  CL 108 110  CO2 24 25  BUN 11 15  CREATININE 0.70 0.66  GLUCOSE 144* 97  CALCIUM 8.4* 8.3*   No results for input(s): LABPT, INR in the last 72 hours.   EXAM General - Patient is Alert, Appropriate and Oriented Extremity - ABD soft Sensation intact distally Intact pulses distally Incision: scant drainage No cellulitis present  - homans sign Dressing/Incision - blood tinged drainage, dressing changed.  Motor Function - intact, moving foot and toes well on exam.  Abdomen-soft without distention or tympany.  Past Medical History:  Diagnosis Date  . Arthritis   . Complication of anesthesia    dizziness  . Hypertension   . Hypothyroidism   . Sleep apnea    cpap  . Thyroid disease     Assessment/Plan: 2 Days Post-Op Procedure(s) (LRB): TOTAL KNEE ARTHROPLASTY (Left) Active  Problems:   Status post total knee replacement using cement, left   Acute post op blood loss anemia   Estimated body mass index is 34.46 kg/m as calculated from the following:   Height as of this encounter: 5\' 7"  (1.702 m).   Weight as of this encounter: 99.8 kg (220 lb 0.3 oz). Advance diet Up with therapy  Needs BM, lactulose added Acute post op blood loss anemia - Hgb stable 10.8 Pain severe last night. Oxycodone d/c. Nucynta started, will alterate with Tramadol CM to assist with discharge to SNF. Possible discharge today pending BM and improved pain. Follow up with [facility] in 2 weeks. TED hose during the day, remove at night time  Lovenox 40  Mg SQ QD x 14 days   DVT Prophylaxis - Lovenox, Foot Pumps and TED hose Weight-Bearing as tolerated to left leg  Duanne Guess, PA-C [facility] 03/23/2016, 8:51 AM

## 2016-03-24 LAB — BASIC METABOLIC PANEL
Anion gap: 4 — ABNORMAL LOW (ref 5–15)
BUN: 18 mg/dL (ref 6–20)
CALCIUM: 8.2 mg/dL — AB (ref 8.9–10.3)
CHLORIDE: 105 mmol/L (ref 101–111)
CO2: 30 mmol/L (ref 22–32)
CREATININE: 0.75 mg/dL (ref 0.44–1.00)
GFR calc non Af Amer: >60 mL/min (ref >60)
Glucose, Bld: 106 mg/dL — ABNORMAL HIGH (ref 65–99)
Potassium: 4.3 mmol/L (ref 3.5–5.1)
SODIUM: 139 mmol/L (ref 135–145)

## 2016-03-24 NOTE — Progress Notes (Signed)
EMS here to transport pt. 

## 2016-03-24 NOTE — Progress Notes (Signed)
Physical Therapy Treatment Patient Details Name: Erika Crawford MRN: [id number] DOB: 1941/06/04 Today's Date: 03/24/2016    History of Present Illness Pt. admitted to [facility] s/p L TKA. hx: HTN, sleep apnea, hypothyroid. WBAT     PT Comments    Pt is making good progress towards goals with improved gait distance this session. Pt appears motivated to perform therapy and demonstrates improved tolerance for there-ex. Slow progress with ROM secondary to pain. Needs KI at this time secondary to weakness. Improved gait sequencing, still fatigues with increased distance.  Follow Up Recommendations  SNF     Equipment Recommendations       Recommendations for Other Services       Precautions / Restrictions Precautions Precautions: Fall Precaution Comments: Has TKR HEP/precaution packet Required Braces or Orthoses: Knee Immobilizer - Left Knee Immobilizer - Left: Discontinue once straight leg raise with < 10 degree lag Restrictions Weight Bearing Restrictions: Yes LLE Weight Bearing: Weight bearing as tolerated    Mobility  Bed Mobility               General bed mobility comments: not performed as pt received seated in recliner  Transfers Overall transfer level: Needs assistance Equipment used: Rolling walker (2 wheeled) Transfers: Sit to/from Stand Sit to Stand: Min assist         General transfer comment: assist for pushing from seated surface. KI donned prior to mobility attempts. Safe technique performed. Pt able to stand with upright posture.  Ambulation/Gait Ambulation/Gait assistance: Min assist Ambulation Distance (Feet): 80 Feet Assistive device: Rolling walker (2 wheeled) Gait Pattern/deviations: Step-through pattern     General Gait Details: Pt with improved step to gait pattern, able to progress to reciprocal gait pattern. Pt able to demonstrate upright posture, however fatigues with increased distance. Slow gait speed noted   Stairs             Wheelchair Mobility    Modified Rankin (Stroke Patients Only)       Balance                                    Cognition Arousal/Alertness: Awake/alert Behavior During Therapy: WFL for tasks assessed/performed Overall Cognitive Status: Within Functional Limits for tasks assessed                      Exercises Total Joint Exercises Goniometric ROM: L knee AAROM 2-75 degrees and limited secondary to pain Other Exercises Other Exercises: Seated ther-ex including L LE ankle pumps, quad sets, glut sets, SLRs, hip abd/add, and seated knee flexion stretches. All ther-ex performed x 12 reps with cga/occasional min assist.    General Comments        Pertinent Vitals/Pain Pain Assessment: 0-10 Pain Score: 7  Pain Location: L knee Pain Descriptors / Indicators: Operative site guarding Pain Intervention(s): Limited activity within patient's tolerance    Home Living                      Prior Function            PT Goals (current goals can now be found in the care plan section) Acute Rehab PT Goals Patient Stated Goal: to improve strength PT Goal Formulation: With patient Progress towards PT goals: Progressing toward goals    Frequency    BID      PT Plan Current plan remains appropriate  Co-evaluation             End of Session Equipment Utilized During Treatment: Gait belt;Left knee immobilizer Activity Tolerance: Patient tolerated treatment well Patient left: in chair;with chair alarm set;with call bell/phone within reach     Time: 0922-0951 PT Time Calculation (min) (ACUTE ONLY): 29 min  Charges:  $Gait Training: 8-22 mins $Therapeutic Exercise: 8-22 mins                    G Codes:      Erika Crawford 04-08-2016, 11:52 AM  Greggory Stallion, PT, DPT [phone]

## 2016-03-24 NOTE — Progress Notes (Signed)
Report called to Benjamine Mola, Therapist, sports at [facility]. EMS called for transportation.

## 2016-03-24 NOTE — Clinical Social Work Note (Signed)
Patient to discharge to [facility] room 209B via non-emergent EMS due to pain. The patient requested that she notify her family. The facility is aware. The patient requested that the RN call her son when EMS arrives. CSW will con't to follow pending any additional dc needs.  Santiago Bumpers, MSW, LCSW-A [phone]

## 2016-03-24 NOTE — Clinical Social Work Placement (Signed)
   CLINICAL SOCIAL WORK PLACEMENT  NOTE  Date:  03/24/2016  Patient Details  Name: Erika Crawford MRN: [id number] Date of Birth: [birth year]  Clinical Social Work is seeking post-discharge placement for this patient at the [place] level of care (*CSW will initial, date and re-position this form in  chart as items are completed):  Yes   Patient/family provided with [facility] Work Department's list of facilities offering this level of care within the geographic area requested by the patient (or if unable, by the patient's family).  Yes   Patient/family informed of their freedom to choose among providers that offer the needed level of care, that participate in Medicare, Medicaid or managed care program needed by the patient, have an available bed and are willing to accept the patient.      Patient/family informed of [facility]'s ownership interest in [facility] and [facility], as well as of the fact that they are under no obligation to receive care at these facilities.  PASRR submitted to EDS on       PASRR number received on       Existing PASRR number confirmed on 03/21/16     FL2 transmitted to all facilities in geographic area requested by pt/family on 03/21/16     FL2 transmitted to all facilities within larger geographic area on       Patient informed that his/her managed care company has contracts with or will negotiate with certain facilities, including the following:        Yes   Patient/family informed of bed offers received.  Patient chooses bed at  [facility].)     Physician recommends and patient chooses bed at  [facility])    Patient to be transferred to  [facility]) on 03/24/16.  Patient to be transferred to facility by non-emergent EMS     Patient family notified on 03/24/16 of transfer.  Name of family member notified:  Shade Flood     PHYSICIAN       Additional Comment:     _______________________________________________ Zettie Pho, LCSW 03/24/2016, 9:11 AM

## 2016-03-24 NOTE — Progress Notes (Signed)
Subjective: 3 Days Post-Op Procedure(s) (LRB): TOTAL KNEE ARTHROPLASTY (Left) Patient reports pain as mild.   Patient is well, and has had no acute complaints or problems Plan is to go Skilled nursing facility after hospital stay. Denies and chest pain, SOB, abdominal pain. No N/V, dizziness.  Objective: Vital signs in last 24 hours: Temp:  [98.1 F (36.7 C)] 98.1 F (36.7 C) (10/08 0434) Pulse Rate:  [60-75] 67 (10/08 0434) Resp:  [18] 18 (10/08 0434) BP: (105-162)/(45-61) 129/57 (10/08 0434) SpO2:  [97 %-99 %] 97 % (10/08 0434)  Intake/Output from previous day:  Intake/Output Summary (Last 24 hours) at 03/24/16 0805 Last data filed at 03/24/16 0556  Gross per 24 hour  Intake              480 ml  Output              350 ml  Net              130 ml    Intake/Output this shift: No intake/output data recorded.  Labs:  Recent Labs  03/22/16 0405 03/23/16 0356  HGB 11.9* 10.8*    Recent Labs  03/22/16 0405 03/23/16 0356  WBC 14.1* 10.3  RBC 3.66* 3.23*  HCT 35.0 31.1*  PLT 166 142*    Recent Labs  03/23/16 0356 03/24/16 0402  NA 141 139  K 4.0 4.3  CL 110 105  CO2 25 30  BUN 15 18  CREATININE 0.66 0.75  GLUCOSE 97 106*  CALCIUM 8.3* 8.2*   No results for input(s): LABPT, INR in the last 72 hours.   EXAM General - Patient is Alert, Appropriate and Oriented Extremity - ABD soft Sensation intact distally Intact pulses distally Incision: scant drainage No cellulitis present  - homans sign Dressing/Incision - blood tinged drainage, dressing changed.  Motor Function - intact, moving foot and toes well on exam.  Abdomen-soft without distention or tympany.  Past Medical History:  Diagnosis Date  . Arthritis   . Complication of anesthesia    dizziness  . Hypertension   . Hypothyroidism   . Sleep apnea    cpap  . Thyroid disease     Assessment/Plan: 3 Days Post-Op Procedure(s) (LRB): TOTAL KNEE ARTHROPLASTY (Left) Active Problems:  Status post total knee replacement using cement, left   Acute post op blood loss anemia   Estimated body mass index is 34.46 kg/m as calculated from the following:   Height as of this encounter: 5\' 7"  (1.702 m).   Weight as of this encounter: 99.8 kg (220 lb 0.3 oz). Advance diet Up with therapy  Acute post op blood loss anemia - Hgb stable 10.8 CM to assist with discharge to SNF. Discharge today. Follow up with [facility] in 2 weeks. TED hose during the day, remove at night time  Lovenox 40  Mg SQ QD x 14 days   DVT Prophylaxis - Lovenox, Foot Pumps and TED hose Weight-Bearing as tolerated to left leg  Duanne Guess, PA-C [facility] 03/24/2016, 8:05 AM

## 2016-04-15 ENCOUNTER — Emergency Department
Admission: EM | Admit: 2016-04-15 | Discharge: 2016-04-15 | Discharge status: Against Medical Advice | Payer: Medicare Other | Attending: Emergency Medicine | Admitting: Emergency Medicine

## 2016-04-15 ENCOUNTER — Encounter: Payer: Self-pay | Admitting: Emergency Medicine

## 2016-04-15 DIAGNOSIS — M542 Cervicalgia: Secondary | ICD-10-CM | POA: Diagnosis not present

## 2016-04-15 DIAGNOSIS — R791 Abnormal coagulation profile: Secondary | ICD-10-CM | POA: Insufficient documentation

## 2016-04-15 DIAGNOSIS — I10 Essential (primary) hypertension: Secondary | ICD-10-CM | POA: Insufficient documentation

## 2016-04-15 DIAGNOSIS — E039 Hypothyroidism, unspecified: Secondary | ICD-10-CM | POA: Diagnosis not present

## 2016-04-15 DIAGNOSIS — R0789 Other chest pain: Secondary | ICD-10-CM

## 2016-04-15 DIAGNOSIS — M549 Dorsalgia, unspecified: Secondary | ICD-10-CM | POA: Insufficient documentation

## 2016-04-15 DIAGNOSIS — R071 Chest pain on breathing: Secondary | ICD-10-CM | POA: Diagnosis present

## 2016-04-15 DIAGNOSIS — Z79899 Other long term (current) drug therapy: Secondary | ICD-10-CM | POA: Insufficient documentation

## 2016-04-15 DIAGNOSIS — R7989 Other specified abnormal findings of blood chemistry: Secondary | ICD-10-CM

## 2016-04-15 LAB — CBC WITH DIFFERENTIAL/PLATELET
BASOS ABS: 0 10*3/uL (ref 0–0.1)
BASOS PCT: 0 %
Eosinophils Absolute: 0.1 10*3/uL (ref 0–0.7)
Eosinophils Relative: 2 %
HEMATOCRIT: 37.6 % (ref 35.0–47.0)
HEMOGLOBIN: 12.3 g/dL (ref 12.0–16.0)
Lymphocytes Relative: 36 %
Lymphs Abs: 2.3 10*3/uL (ref 1.0–3.6)
MCH: 32.5 pg (ref 26.0–34.0)
MCHC: 32.8 g/dL (ref 32.0–36.0)
MCV: 99.1 fL (ref 80.0–100.0)
Monocytes Absolute: 0.5 10*3/uL (ref 0.2–0.9)
Monocytes Relative: 8 %
NEUTROS ABS: 3.5 10*3/uL (ref 1.4–6.5)
NEUTROS PCT: 54 %
Platelets: 264 10*3/uL (ref 150–440)
RBC: 3.8 MIL/uL (ref 3.80–5.20)
RDW: 14.9 % — AB (ref 11.5–14.5)
WBC: 6.5 10*3/uL (ref 3.6–11.0)

## 2016-04-15 LAB — PROTIME-INR
INR: 1.06
Prothrombin Time: 13.8 seconds (ref 11.4–15.2)

## 2016-04-15 LAB — FIBRIN DERIVATIVES D-DIMER (ARMC ONLY): Fibrin derivatives D-dimer (ARMC): 3297 — ABNORMAL HIGH (ref 0–499)

## 2016-04-15 LAB — BASIC METABOLIC PANEL
ANION GAP: 9 (ref 5–15)
BUN: 13 mg/dL (ref 6–20)
CALCIUM: 9.5 mg/dL (ref 8.9–10.3)
CHLORIDE: 101 mmol/L (ref 101–111)
CO2: 26 mmol/L (ref 22–32)
Creatinine, Ser: 0.64 mg/dL (ref 0.44–1.00)
GFR calc non Af Amer: >60 mL/min (ref >60)
Glucose, Bld: 104 mg/dL — ABNORMAL HIGH (ref 65–99)
Potassium: 3.3 mmol/L — ABNORMAL LOW (ref 3.5–5.1)
Sodium: 136 mmol/L (ref 135–145)

## 2016-04-15 LAB — APTT: APTT: 30 s (ref 24–36)

## 2016-04-15 NOTE — ED Notes (Signed)
Pt reports that her back and right side are hurting - they have been hurting for the last 3 days but pain increasing and spread into her neck and upper back - states she had knee surgery Oct 5th and since then her back and side have been hurting - pt denies pain or frequency with urination

## 2016-04-15 NOTE — ED Notes (Signed)
Called lab to add on D-Dimer

## 2016-04-15 NOTE — ED Triage Notes (Signed)
C/O right arm pain, mid back pain, neck pain x 3 days.  Symptoms worse over the past three days.  Patient has taken 2 tylenol and 2 tramadol tabs for pain relief today, little relief.

## 2016-04-15 NOTE — Discharge Instructions (Signed)
You were seen in the ED today for pain in the right upper back.  This appears to be musculoskeletal, but due to your recent surgery, we did further blood testing to evaluate for blood clots in the lungs.  This test came back very elevated and should be followed up with a CT Angiogram of the chest.  However, you did not wish to stay in the ER any longer to wait for this to be done tonight.  Please follow up with your primary care doctor to arrange this test on an outpatient basis.  Return to the ER immediately if you have worsening chest pain or shortness of breath.

## 2016-04-15 NOTE — ED Provider Notes (Signed)
[facility] Emergency Department Provider Note  ____________________________________________  Time seen: Approximately 7:10 PM  I have reviewed the triage vital signs and the nursing notes.   HISTORY  Chief Complaint Back Pain and Neck Pain    HPI Erika Crawford is a 75 y.o. female who complains of gradual onset of right upper back pain radiating across her lower back for the past 3 days. It's constant and pleuritic. Worse with movement. Described as aching. No slips trips or falls. She thinks it could be due to grab bar use in her bathroom where she just used to pull herself up with her right arm, but now she has tenderness on both sides and it hasn't gotten better. She is doing physical therapy after a recent knee surgery 4 weeks ago by Dr. Roland Rack. The leg is feeling better swelling is subsiding, no acute changes or worsening. She is happy with the progress there. No exertional symptoms. No vomiting or diaphoresis or shortness of breath.     Past Medical History:  Diagnosis Date  . Arthritis   . Complication of anesthesia    dizziness  . Hypertension   . Hypothyroidism   . Sleep apnea    cpap  . Thyroid disease      Patient Active Problem List   Diagnosis Date Noted  . Status post total knee replacement using cement, 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     Past Surgical History:  Procedure Laterality Date  . ABDOMINAL HYSTERECTOMY    . BACK SURGERY     lower middle kyphoplasty  . COLONOSCOPY WITH PROPOFOL N/A 09/21/2015   Procedure: COLONOSCOPY WITH PROPOFOL;  Surgeon: Hulen Luster, MD;  Location[facility] ENDOSCOPY;  Service: Gastroenterology;  Laterality: N/A;  . JOINT REPLACEMENT     03/19/2016- left knee replacement  . 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   Procedure: TOTAL KNEE ARTHROPLASTY;  Surgeon: Corky Mull, MD;  Location: [facility] ORS;  Service: Orthopedics;  Laterality: Left;     Prior to Admission medications   Medication Sig Start Date End Date  Taking? Authorizing Provider  acetaminophen (TYLENOL) 500 MG tablet Take 500 mg by mouth every 6 (six) hours as needed.    Historical Provider, MD  alendronate (FOSAMAX) 70 MG tablet Take 70 mg by mouth once a week. Take with a full glass of water on an empty stomach.    Historical Provider, MD  Calcium Carbonate-Vitamin D (CALTRATE 600+D) 600-400 MG-UNIT tablet Take 1 tablet by mouth 2 (two) times daily.    Historical Provider, MD  diclofenac (VOLTAREN) 50 MG EC tablet Take 50 mg by mouth 2 (two) times daily.    Historical Provider, MD  diclofenac sodium (VOLTAREN) 1 % GEL Apply topically 3 (three) times daily.    Historical Provider, MD  enoxaparin (LOVENOX) 40 MG/0.4ML injection Inject 0.4 mLs (40 mg total) into the skin daily. 03/23/16 04/06/16  Duanne Guess, PA-C  levothyroxine (SYNTHROID, LEVOTHROID) 100 MCG tablet Take 100 mcg by mouth daily before breakfast.    Historical Provider, MD  mirtazapine (REMERON) 15 MG tablet Take 7.5 mg by mouth at bedtime.    Historical Provider, MD  mometasone (NASONEX) 50 MCG/ACT nasal spray Place 2 sprays into the nose daily.    Historical Provider, MD  omeprazole (PRILOSEC) 20 MG capsule Take 20 mg by mouth daily.    Historical Provider, MD  ondansetron (ZOFRAN) 4 MG tablet Take 1 tablet (4 mg total) by mouth every 6 (six) hours as needed for nausea or  vomiting. 03/22/16   Lattie Corns, PA-C  tapentadol (NUCYNTA) 50 MG tablet Take 1 tablet (50 mg total) by mouth every 4 (four) hours as needed for moderate pain. 03/23/16   Duanne Guess, PA-C  traMADol (ULTRAM) 50 MG tablet Take 1-2 tablets (50-100 mg total) by mouth every 6 (six) hours as needed for moderate pain. 03/23/16   Duanne Guess, PA-C  valsartan (DIOVAN) 320 MG tablet Take 320 mg by mouth daily.    Historical Provider, MD  VITAMIN D, ERGOCALCIFEROL, PO Take by mouth 2 (two) times daily.    Historical Provider, MD     Allergies Celebrex [celecoxib]; Codeine; Etodolac;  Hydrochlorothiazide; Oxycodone; Pravastatin; Relafen [nabumetone]; and Tramadol   Family History  Problem Relation Age of Onset  . Breast cancer Neg Hx     Social History Social History  Substance Use Topics  . Smoking status: Never Smoker  . Smokeless tobacco: Never Used  . Alcohol use Yes     Comment: very little    Review of Systems  Constitutional:   No fever or chills.   Cardiovascular:   Positive as above chest pain. Respiratory:   No dyspnea or cough. Gastrointestinal:   Negative for abdominal pain, vomiting and diarrhea.  Genitourinary:   Negative for dysuria or difficulty urinating. Musculoskeletal:   Left knee pain and swelling, subacute, improving.  10-point ROS otherwise negative.  ____________________________________________   PHYSICAL EXAM:  VITAL SIGNS: ED Triage Vitals  Enc Vitals Group     BP 04/15/16 1440 (!) 144/79     Pulse Rate 04/15/16 1438 90     Resp 04/15/16 1438 16     Temp 04/15/16 1438 98.8 F (37.1 C)     Temp Source 04/15/16 1438 Oral     SpO2 04/15/16 1438 97 %     Weight 04/15/16 1439 213 lb (96.6 kg)     Height 04/15/16 1439 5\' 7"  (1.702 m)     Head Circumference --      Peak Flow --      Pain Score 04/15/16 1439 9     Pain Loc --      Pain Edu? --      Excl. in [place]? --     Vital signs reviewed, nursing assessments reviewed.   Constitutional:   Alert and oriented. Well appearing and in no distress. Eyes:   No scleral icterus. No conjunctival pallor. PERRL. EOMI.  No nystagmus. ENT   Head:   Normocephalic and atraumatic.   Nose:   No congestion/rhinnorhea. No septal hematoma   Mouth/Throat:   MMM, no pharyngeal erythema. No peritonsillar mass.    Neck:   No stridor. No SubQ emphysema. No meningismus. Hematological/Lymphatic/Immunilogical:   No cervical lymphadenopathy. Cardiovascular:   RRR. Symmetric bilateral radial and DP pulses.  No murmurs.  Respiratory:   Normal respiratory effort without tachypnea nor  retractions. Breath sounds are clear and equal bilaterally. No wheezes/rales/rhonchi.Right upper back on the posterior chest wall is tender to the touch reproducing her symptoms. Worse with rotational motion of the thorax. Gastrointestinal:   Soft and nontender. Non distended. There is no CVA tenderness.  No rebound, rigidity, or guarding. Genitourinary:   deferred Musculoskeletal:   Nontender with normal range of motion in all extremities. No joint effusions.  No lower extremity tenderness.  No edema. Neurologic:   Normal speech and language.  CN 2-10 normal. Motor grossly intact. No gross focal neurologic deficits are appreciated.  Skin:    Skin is warm,  dry and intact. No rash noted.  No petechiae, purpura, or bullae.  ____________________________________________    LABS (pertinent positives/negatives) (all labs ordered are listed, but only abnormal results are displayed) Labs Reviewed  CBC WITH DIFFERENTIAL/PLATELET - Abnormal; Notable for the following:       Result Value   RDW 14.9 (*)    All other components within normal limits  BASIC METABOLIC PANEL - Abnormal; Notable for the following:    Potassium 3.3 (*)    Glucose, Bld 104 (*)    All other components within normal limits  FIBRIN DERIVATIVES D-DIMER ([facility] ONLY) - Abnormal; Notable for the following:    Fibrin derivatives D-dimer ([facility]) 3,297 (*)    All other components within normal limits  PROTIME-INR  APTT   ____________________________________________   EKG    ____________________________________________    RADIOLOGY    ____________________________________________   PROCEDURES Procedures  ____________________________________________   INITIAL IMPRESSION / ASSESSMENT AND PLAN / ED COURSE  Pertinent labs & imaging results that were available during my care of the patient were reviewed by me and considered in my medical decision making (see chart for details).  Patient presents with pleuritic  chest pain for weeks after surgery., Blood thinners, no history of cancer. No history of DVT or PE. The pain appears to musculoskeletal and is reproducible on exam. However, due to recent surgery and some persistent swelling of the leg, I'm concerned that she could have DVT or PE. Overall her symptoms in the legs seem to be improving and not concerning for an acute DVT, but I decided to further screening her with a d-dimer. This came back severely elevated at 3300. However, after the two-hour time It took the lab to obtain this result, the patient does not wish to wait anymore. She is decided that she will go home and follow-up with her primary care doctor to obtain the CT angiogram that she needs on an outpatient basis. I strongly encouraged her to stay for the study but she wishes to go home and she is artery caught her ride. Follow up with her primary care doctor tomorrow. I provided her with her results so that she can relay all essentially information to her doctor to coordinate further evaluation. Return precautions given. Low suspicion for ACS dissection carditis pneumothorax or pneumonia. Patient is not septic. She is leaving the hospital [place] at this time. She has medical decision-making capacity.     Clinical Course   ____________________________________________   FINAL CLINICAL IMPRESSION(S) / ED DIAGNOSES  Final diagnoses:  Acute chest wall pain  Elevated d-dimer       Portions of this note were generated with dragon dictation software. Dictation errors may occur despite best attempts at proofreading.    Carrie Mew, MD 04/15/16 [phone]

## 2016-04-17 ENCOUNTER — Inpatient Hospital Stay: Admission: RE | Admit: 2016-04-17 | Payer: Medicare Other | Source: Ambulatory Visit

## 2016-04-17 ENCOUNTER — Encounter: Payer: Self-pay | Admitting: *Deleted

## 2016-04-17 ENCOUNTER — Emergency Department
Admission: EM | Admit: 2016-04-17 | Discharge: 2016-04-17 | Disposition: A | Discharge status: Home / Self Care | Payer: Medicare Other | Attending: Emergency Medicine | Admitting: Emergency Medicine

## 2016-04-17 DIAGNOSIS — Y9389 Activity, other specified: Secondary | ICD-10-CM | POA: Diagnosis not present

## 2016-04-17 DIAGNOSIS — X58XXXA Exposure to other specified factors, initial encounter: Secondary | ICD-10-CM | POA: Diagnosis not present

## 2016-04-17 DIAGNOSIS — S29012A Strain of muscle and tendon of back wall of thorax, initial encounter: Secondary | ICD-10-CM | POA: Diagnosis not present

## 2016-04-17 DIAGNOSIS — Y929 Unspecified place or not applicable: Secondary | ICD-10-CM | POA: Diagnosis not present

## 2016-04-17 DIAGNOSIS — R110 Nausea: Secondary | ICD-10-CM | POA: Diagnosis not present

## 2016-04-17 DIAGNOSIS — Y999 Unspecified external cause status: Secondary | ICD-10-CM | POA: Insufficient documentation

## 2016-04-17 DIAGNOSIS — K5900 Constipation, unspecified: Secondary | ICD-10-CM

## 2016-04-17 DIAGNOSIS — E039 Hypothyroidism, unspecified: Secondary | ICD-10-CM | POA: Diagnosis not present

## 2016-04-17 DIAGNOSIS — S299XXA Unspecified injury of thorax, initial encounter: Secondary | ICD-10-CM | POA: Diagnosis present

## 2016-04-17 DIAGNOSIS — I10 Essential (primary) hypertension: Secondary | ICD-10-CM | POA: Diagnosis not present

## 2016-04-17 DIAGNOSIS — Z79899 Other long term (current) drug therapy: Secondary | ICD-10-CM | POA: Diagnosis not present

## 2016-04-17 DIAGNOSIS — T148XXA Other injury of unspecified body region, initial encounter: Secondary | ICD-10-CM

## 2016-04-17 DIAGNOSIS — M549 Dorsalgia, unspecified: Secondary | ICD-10-CM

## 2016-04-17 MED ORDER — ONDANSETRON 4 MG PO TBDP
4.0000 mg | ORAL_TABLET | Freq: Once | ORAL | Status: AC
  Administered 2016-04-17: 4 mg via ORAL
  Filled 2016-04-17: qty 1

## 2016-04-17 MED ORDER — ONDANSETRON 4 MG PO TBDP: ORAL_TABLET | ORAL | 0 refills | Status: DC

## 2016-04-17 NOTE — ED Triage Notes (Addendum)
Pt states feeling as if she needs to throw up and have a bowel movement, states she was seen here Monday for same and left before evaluation was done, states she was seen at [facility] yesterday and had CT scan done and was sent home with pain medicine for her back ache, pt returns today stating "I dont know what I need, the doctor needs to help me", pt awake and alert in no acute distress, states she has been sick on her stomach since she took norco last night, states she is still having back pain but was afraid to take more pain meds

## 2016-04-17 NOTE — Discharge Instructions (Signed)
We believe that your back pain is caused by musculoskeletal strain.  Please read through the included information about additional care such as heating pads, over-the-counter pain medicine.  If you were provided a prescription please use it only as needed and as instructed.  Remember that early mobility and using the affected part of your body is actually better than keeping it immobile.  Additionally, since the oxycodone because the nausea you are experiencing, we recommend you stick to the tramadol that was working for you and did not seem to have bad side effects.  Use the prescribe Zofran as needed for nausea control.  To help with your constipation, we recommend that you use one or more of the following over-the-counter medications in the order described:   1)  Colace (or Dulcolax) 100 mg:  This is a stool softener, and you may take it once or twice a day as needed. 2)  Senna tablets:  This is a bowel stimulant that will help "push" out your stool. It is the next step to add after you have tried a stool softener. 3)  Miralax (powder):  This medication works by drawing additional fluid into your intestines and helps to flush out your stool.  Mix the powder with water or juice according to label instructions.  It may help if the Colace and Senna are not sufficient, but you must be sure to use the recommended amount of water or juice when you mix up the powder. Remember that narcotic pain medications are constipating, so avoid them or minimize their use.  Drink plenty of fluids.  Follow-up with the doctor listed as recommended or return to the emergency department with new or worsening symptoms that concern you.

## 2016-04-17 NOTE — ED Provider Notes (Signed)
[facility] Emergency Department Provider Note  ____________________________________________   First MD Initiated Contact with Patient 04/17/16 1134     (approximate)  I have reviewed the triage vital signs and the nursing notes.   HISTORY  Chief Complaint No chief complaint on file.    HPI Erika Crawford is a 75 y.o. female who presents for evaluation of right upper back pain, nausea, and constipation.  This is her third ED visit in the last 2-3 days.  She presented to this emergency department 2 days agofor initial evaluation of her right upper back pain.  In summary, it has been gradual in onset, radiates from the right upper part of her back down to the right lower part, and started gradually after she began using a grab bar in her bathroom following knee surgery that she had about one month ago.  The pain is highly reproducible with palpation of the soft tissues of the right side of her back as well as with movement of her right arm.  She had an elevated d-dimer but signed out AMA before the CTA chest because of frustration with the long wait.  She then went to the [facility] emergency department yesterday and had a full workup including blood work, EKG, and CTA chest.  Other than a pulmonary nodule the scan was unremarkable as was the rest of her workup.  She was diagnosed with musculoskeletal pain and prescribed a Lidoderm patch, Percocet, and outpatient follow-up.  Presents today because she thinks that her nausea and probably constipation is secondary to the oxycodone.  She states that she knows that she has a bad reaction to it and that "the doctor yesterday must not have read my chart."  She reports that the nausea is constant, mild to moderate, nothing makes better nor worse.  She has not had any vomiting.  She also feels somewhat bloated and constipated and has not had a bowel movement for a couple of days.  She is not taking any stool softeners  or laxatives.  Pain is unchanged and she states that it was previously helped with the tramadol prescribed by Dr. Roland Rack.  She also states that she has another prescription of tramadol at the pharmacy refill by Dr. Roland Rack that she can pick up at any time.  He denies chest pain, shortness of breath, abdominal pain, dysuria.  She is not an intravenous drug user and has not been having any night sweats or fevers.  She has not had any rash in the distribution of pain that she is having in her back.  She has not had any viral symptoms recently.     Past Medical History:  Diagnosis Date  . Arthritis   . Complication of anesthesia    dizziness  . Hypertension   . Hypothyroidism   . Sleep apnea    cpap  . Thyroid disease     Patient Active Problem List   Diagnosis Date Noted  . 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 ABDOMINAL HYSTERECTOMY    . BACK SURGERY     lower middle kyphoplasty  . COLONOSCOPY WITH PROPOFOL N/A 09/21/2015   Procedure: COLONOSCOPY WITH PROPOFOL;  Surgeon: Hulen Luster, MD;  Location: [facility] ENDOSCOPY;  Service: Gastroenterology;  Laterality: N/A;  . JOINT REPLACEMENT     03/19/2016- left knee replacement  . 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   Procedure: TOTAL  KNEE ARTHROPLASTY;  Surgeon: Corky Mull, MD;  Location: [facility] ORS;  Service: Orthopedics;  Laterality: Left;    Prior to Admission medications   Medication Sig Start Date End Date Taking? Authorizing Provider  acetaminophen (TYLENOL) 500 MG tablet Take 500 mg by mouth every 6 (six) hours as needed.    Historical Provider, MD  alendronate (FOSAMAX) 70 MG tablet Take 70 mg by mouth once a week. Take with a full glass of water on an empty stomach.    Historical Provider, MD  Calcium Carbonate-Vitamin D (CALTRATE 600+D) 600-400 MG-UNIT tablet Take 1 tablet by mouth 2 (two) times daily.    Historical Provider, MD  diclofenac  (VOLTAREN) 50 MG EC tablet Take 50 mg by mouth 2 (two) times daily.    Historical Provider, MD  diclofenac sodium (VOLTAREN) 1 % GEL Apply topically 3 (three) times daily.    Historical Provider, MD  enoxaparin (LOVENOX) 40 MG/0.4ML injection Inject 0.4 mLs (40 mg total) into the skin daily. 03/23/16 04/06/16  Duanne Guess, PA-C  levothyroxine (SYNTHROID, LEVOTHROID) 100 MCG tablet Take 100 mcg by mouth daily before breakfast.    Historical Provider, MD  mirtazapine (REMERON) 15 MG tablet Take 7.5 mg by mouth at bedtime.    Historical Provider, MD  mometasone (NASONEX) 50 MCG/ACT nasal spray Place 2 sprays into the nose daily.    Historical Provider, MD  omeprazole (PRILOSEC) 20 MG capsule Take 20 mg by mouth daily.    Historical Provider, MD  ondansetron (ZOFRAN ODT) 4 MG disintegrating tablet Allow 1-2 tablets to dissolve in your mouth every 8 hours as needed for nausea/vomiting 04/17/16   Hinda Kehr, MD  ondansetron (ZOFRAN) 4 MG tablet Take 1 tablet (4 mg total) by mouth every 6 (six) hours as needed for nausea or vomiting. 03/22/16   Lattie Corns, PA-C  tapentadol (NUCYNTA) 50 MG tablet Take 1 tablet (50 mg total) by mouth every 4 (four) hours as needed for moderate pain. 03/23/16   Duanne Guess, PA-C  traMADol (ULTRAM) 50 MG tablet Take 1-2 tablets (50-100 mg total) by mouth every 6 (six) hours as needed for moderate pain. 03/23/16   Duanne Guess, PA-C  valsartan (DIOVAN) 320 MG tablet Take 320 mg by mouth daily.    Historical Provider, MD  VITAMIN D, ERGOCALCIFEROL, PO Take by mouth 2 (two) times daily.    Historical Provider, MD    Allergies Celebrex [celecoxib]; Codeine; Etodolac; Hydrochlorothiazide; Oxycodone; Pravastatin; Relafen [nabumetone]; and Tramadol  Family History  Problem Relation Age of Onset  . Breast cancer Neg Hx     Social History Social History  Substance Use Topics  . Smoking status: Never Smoker  . Smokeless tobacco: Never Used  . Alcohol use Yes       Comment: very little    Review of Systems Constitutional: No fever/chills Eyes: No visual changes. ENT: No sore throat. Cardiovascular: Denies chest pain. Respiratory: Denies shortness of breath. Gastrointestinal: No abdominal pain.  Nausea, no vomiting.  No diarrhea.  No constipation. Genitourinary: Negative for dysuria. Musculoskeletal: Reproducible pain in the right side of her back radiating from the right upper back to the right lower back and worsened with movement of the right arm.  Chronic post-op pain in right knee, ambulates with cane Skin: Negative for rash. Neurological: Negative for headaches, focal weakness or numbness.  10-point ROS otherwise negative.  ____________________________________________   PHYSICAL EXAM:  VITAL SIGNS: ED Triage Vitals  Enc Vitals Group  BP 04/17/16 1009 136/86     Pulse Rate 04/17/16 1009 (!) 105     Resp 04/17/16 1009 18     Temp 04/17/16 1009 98.8 F (37.1 C)     Temp Source 04/17/16 1009 Oral     SpO2 04/17/16 1009 100 %     Weight 04/17/16 1010 212 lb (96.2 kg)     Height 04/17/16 1010 5\' 2"  (1.575 m)     Head Circumference --      Peak Flow --      Pain Score 04/17/16 1010 7     Pain Loc --      Pain Edu? --      Excl. in Post? --     Constitutional: Alert and oriented. Well appearing and in no acute distress. Eyes: Conjunctivae are normal. PERRL. EOMI. Head: Atraumatic. Nose: No congestion/rhinnorhea. Mouth/Throat: Mucous membranes are moist.  Oropharynx non-erythematous. Neck: No stridor.  No meningeal signs.  No cervical spine tenderness to palpation. Cardiovascular: Normal rate, regular rhythm. Good peripheral circulation. Grossly normal heart sounds. Respiratory: Normal respiratory effort.  No retractions. Lungs CTAB. Gastrointestinal: Soft and nontender. No distention.  Musculoskeletal: The patient has highly reproducible soft tissue tenderness in the right upper back and the tissues radiating down to the  right lower back.  She has absolutely no tenderness to palpation of the C, T, nor L-spine with no step-offs nor deformities and no indication of infection or point bony tenderness.  No lower extremity tenderness nor edema. No gross deformities of extremities.  Well-appearing left post-op knee, ambulatory with cane. Neurologic:  Normal speech and language. No gross focal neurologic deficits are appreciated.  Skin:  Skin is warm, dry and intact. No rash noted occluding no vesicular lesions nor erythema Psychiatric: Mood and affect are normal. Speech and behavior are normal.  ____________________________________________   LABS (all labs ordered are listed, but only abnormal results are displayed)  Labs Reviewed - No data to display ____________________________________________  EKG  None - EKG not ordered by ED physician ____________________________________________  RADIOLOGY   No results found.  ____________________________________________   PROCEDURES  Procedure(s) performed:   Procedures   Critical Care performed: No ____________________________________________   INITIAL IMPRESSION / ASSESSMENT AND PLAN / ED COURSE  Pertinent labs & imaging results that were available during my care of the patient were reviewed by me and considered in my medical decision making (see chart for details).  The patient has had 2 extensive workups recently and is mostly just frustrated at her persistent symptoms.  I agree with the 2 prior providers (Dr. Joni Fears at [facility] and the visit yesterday at [facility]) that the patient's symptoms are very consistent with musculoskeletal strain in her back, compensating for her recent knee surgery and pulling herself up from the commode.  She has absolutely no point tenderness in her spine, and much more unlikely/rare diagnoses such as vertebral ostomy myelitis or very unlikely particularly given that she has no risk factors and no  supporting symptoms such as fever, night sweats, leukocytosis.  Additionally, her CT scan yesterday did not show any bony lesions, and although it is not definitive it is encouraging.  Do not believe an MRI would be in her best interests and would simply constitute unnecessary and additional testing for very low yield.  Focal neurological deficits.  I had a long talk with her about using the tramadol that she knows works and which does not seem to have negative side effects.  I  also encouraged her to try Colace, senna, and MiraLAX as needed for her constipation.  She seems reassured after our conversation is comfortable with the plan to follow up with Dr. Roland Rack.  I gave my usual and customary return precautions.       ____________________________________________  FINAL CLINICAL IMPRESSION(S) / ED DIAGNOSES  Final diagnoses:  Musculoskeletal back pain  Muscle strain  Nausea  Constipation, unspecified constipation type     MEDICATIONS GIVEN DURING THIS VISIT:  Medications  ondansetron (ZOFRAN-ODT) disintegrating tablet 4 mg (not administered)     NEW OUTPATIENT MEDICATIONS STARTED DURING THIS VISIT:  New Prescriptions   ONDANSETRON (ZOFRAN ODT) 4 MG DISINTEGRATING TABLET    Allow 1-2 tablets to dissolve in your mouth every 8 hours as needed for nausea/vomiting    Modified Medications   No medications on file    Discontinued Medications   No medications on file     Note:  This document was prepared using Dragon voice recognition software and may include unintentional dictation errors.    Hinda Kehr, MD 04/17/16 [phone]

## 2016-04-22 ENCOUNTER — Other Ambulatory Visit: Payer: Self-pay | Admitting: Internal Medicine

## 2016-04-22 DIAGNOSIS — M810 Age-related osteoporosis without current pathological fracture: Secondary | ICD-10-CM

## 2016-06-04 ENCOUNTER — Ambulatory Visit: Payer: Medicare Other

## 2016-06-05 ENCOUNTER — Other Ambulatory Visit: Payer: Self-pay | Admitting: Internal Medicine

## 2016-06-05 DIAGNOSIS — Z1231 Encounter for screening mammogram for malignant neoplasm of breast: Secondary | ICD-10-CM

## 2016-06-21 ENCOUNTER — Other Ambulatory Visit: Payer: Self-pay | Admitting: Physician Assistant

## 2016-06-21 ENCOUNTER — Ambulatory Visit
Admission: RE | Admit: 2016-06-21 | Discharge: 2016-06-21 | Disposition: A | Discharge status: Home / Self Care | Payer: Medicare Other | Source: Ambulatory Visit | Attending: Physician Assistant | Admitting: Physician Assistant

## 2016-06-21 DIAGNOSIS — M79605 Pain in left leg: Secondary | ICD-10-CM

## 2016-06-21 DIAGNOSIS — M7989 Other specified soft tissue disorders: Secondary | ICD-10-CM | POA: Insufficient documentation

## 2016-06-21 DIAGNOSIS — M7582 Other shoulder lesions, left shoulder: Secondary | ICD-10-CM | POA: Insufficient documentation

## 2016-06-21 DIAGNOSIS — R609 Edema, unspecified: Secondary | ICD-10-CM

## 2016-07-03 ENCOUNTER — Ambulatory Visit: Payer: Medicare Other

## 2016-07-08 ENCOUNTER — Other Ambulatory Visit: Payer: Self-pay | Admitting: Internal Medicine

## 2016-07-08 DIAGNOSIS — N289 Disorder of kidney and ureter, unspecified: Secondary | ICD-10-CM

## 2016-07-08 DIAGNOSIS — R748 Abnormal levels of other serum enzymes: Secondary | ICD-10-CM

## 2016-07-15 ENCOUNTER — Ambulatory Visit
Admission: RE | Admit: 2016-07-15 | Discharge: 2016-07-15 | Disposition: A | Discharge status: Home / Self Care | Payer: Medicare Other | Source: Ambulatory Visit | Attending: Internal Medicine | Admitting: Internal Medicine

## 2016-07-15 DIAGNOSIS — R748 Abnormal levels of other serum enzymes: Secondary | ICD-10-CM | POA: Insufficient documentation

## 2016-07-15 DIAGNOSIS — N1330 Unspecified hydronephrosis: Secondary | ICD-10-CM | POA: Insufficient documentation

## 2016-07-15 DIAGNOSIS — N289 Disorder of kidney and ureter, unspecified: Secondary | ICD-10-CM | POA: Diagnosis present

## 2016-07-15 DIAGNOSIS — Z9049 Acquired absence of other specified parts of digestive tract: Secondary | ICD-10-CM | POA: Insufficient documentation

## 2016-07-18 ENCOUNTER — Encounter: Payer: Self-pay | Admitting: Urology

## 2016-07-18 ENCOUNTER — Ambulatory Visit (INDEPENDENT_AMBULATORY_CARE_PROVIDER_SITE_OTHER): Payer: Medicare Other | Admitting: Urology

## 2016-07-18 VITALS — BP 142/74 | HR 86 | Ht 67.0 in | Wt 209.0 lb

## 2016-07-18 DIAGNOSIS — N1330 Unspecified hydronephrosis: Secondary | ICD-10-CM | POA: Diagnosis not present

## 2016-07-18 DIAGNOSIS — N289 Disorder of kidney and ureter, unspecified: Secondary | ICD-10-CM

## 2016-07-18 LAB — URINALYSIS, COMPLETE
BILIRUBIN UA: NEGATIVE
Glucose, UA: NEGATIVE
Ketones, UA: NEGATIVE
Leukocytes, UA: NEGATIVE
NITRITE UA: NEGATIVE
PH UA: 6 (ref 5.0–7.5)
PROTEIN UA: NEGATIVE
RBC UA: NEGATIVE
Specific Gravity, UA: 1.02 (ref 1.005–1.030)
UUROB: 0.2 mg/dL (ref 0.2–1.0)

## 2016-07-18 LAB — MICROSCOPIC EXAMINATION
BACTERIA UA: NONE SEEN
WBC UA: NONE SEEN /HPF (ref 0–?)

## 2016-07-18 NOTE — Progress Notes (Signed)
07/18/2016 4:19 PM   Erika Crawford November 27, 1940 [id number]  Referring provider: Glendon Axe, MD [place] [facility] [place], [place] [address]  Chief Complaint  Patient presents with  . Hydronephrosis    New Patient    HPI: 58 showed female referred for further evaluation of moderate bilateral hydroureteral nephrosis. She underwent a right upper quadrant ultrasound for further evaluation of elevated LFTs which have incidental moderate right hydronephrosis was appreciated. This is followed up with a bilateral renal ultrasound which showed moderate bilateral hydronephrosis with only 90 cc in the bladder.  Study did also indicate no right ureteral jet was identified.  She denies any flank pain.    No personal history of kidney stones.  No personal history of malignancy. No family history of GU malignancy, sister with melanoma.  No recent weight loss or night sweats.    Quit smoking ~40 years ago.  Smoked socially only.    No gross or microscopic hematuria, UA unremarkable today.  Previous renal imaging Ct abd/pelvis on 09/2013 without contrast at [facility] shows no nephrolithiasis but does report numerous calcifications along the path of the right ureter favoring vascular calcifications given the lack of hydroureter and perinephric stranding.  No hydronephrosis was appreciated at that time bilaterally.  Most recent Cr 0.8 on 07/11/16.  She did have slight rise  to 1.3 on 07/05/16 when ill with flu like symptoms along with mildly elevated LFTs.     PMH: Past Medical History:  Diagnosis Date  . Arthritis   . Complication of anesthesia    dizziness  . Hypertension   . Hypothyroidism   . Sleep apnea    cpap  . Thyroid disease     Surgical History: Past Surgical History:  Procedure Laterality Date  . ABDOMINAL HYSTERECTOMY    . BACK SURGERY     lower middle kyphoplasty  . COLONOSCOPY WITH PROPOFOL N/A 09/21/2015   Procedure: COLONOSCOPY WITH PROPOFOL;   Surgeon: Hulen Luster, MD;  Location: [facility] ENDOSCOPY;  Service: Gastroenterology;  Laterality: N/A;  . JOINT REPLACEMENT     03/19/2016- left knee replacement  . 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   Procedure: TOTAL KNEE ARTHROPLASTY;  Surgeon: Corky Mull, MD;  Location: [facility] ORS;  Service: Orthopedics;  Laterality: Left;    Home Medications:  Allergies as of 07/18/2016      Reactions   Celebrex [celecoxib]    Codeine Nausea And Vomiting   Etodolac Nausea And Vomiting   Hydrochlorothiazide    Oxycodone Nausea And Vomiting   Pravastatin    Relafen [nabumetone]    Tramadol Nausea And Vomiting   Taking now with something for nausea      Medication List       Accurate as of 07/18/16  4:19 PM. Always use your most recent med list.          acetaminophen 500 MG tablet Commonly known as:  TYLENOL Take 500 mg by mouth every 6 (six) hours as needed.   alendronate 70 MG tablet Commonly known as:  FOSAMAX Take 70 mg by mouth once a week. Take with a full glass of water on an empty stomach.   CALTRATE 600+D 600-400 MG-UNIT tablet Generic drug:  Calcium Carbonate-Vitamin D Take 1 tablet by mouth 2 (two) times daily.   diclofenac 50 MG EC tablet Commonly known as:  VOLTAREN Take 50 mg by mouth 2 (two) times daily.   diclofenac sodium 1 % Gel Commonly known as:  VOLTAREN  Apply topically 3 (three) times daily.   levothyroxine 100 MCG tablet Commonly known as:  SYNTHROID, LEVOTHROID Take 100 mcg by mouth daily before breakfast.   mometasone 50 MCG/ACT nasal spray Commonly known as:  NASONEX Place 2 sprays into the nose daily.   omeprazole 20 MG capsule Commonly known as:  PRILOSEC Take 20 mg by mouth daily.   tapentadol 50 MG tablet Commonly known as:  NUCYNTA Take 1 tablet (50 mg total) by mouth every 4 (four) hours as needed for moderate pain.   valsartan 320 MG tablet Commonly known as:  DIOVAN Take 320 mg by mouth daily.   VITAMIN D (ERGOCALCIFEROL) PO Take by  mouth 2 (two) times daily.       Allergies:  Allergies  Allergen Reactions  . Celebrex [Celecoxib]   . Codeine Nausea And Vomiting  . Etodolac Nausea And Vomiting  . Hydrochlorothiazide   . Oxycodone Nausea And Vomiting  . Pravastatin   . Relafen [Nabumetone]   . Tramadol Nausea And Vomiting    Taking now with something for nausea    Family History: Family History  Problem Relation Age of Onset  . Breast cancer Neg Hx     Social History:  reports that she has never smoked. She has never used smokeless tobacco. She reports that she drinks alcohol. She reports that she does not use drugs.  ROS: UROLOGY Frequent Urination?: No Hard to postpone urination?: No Burning/pain with urination?: No Get up at night to urinate?: No Leakage of urine?: No Urine stream starts and stops?: No Trouble starting stream?: No Do you have to strain to urinate?: No Blood in urine?: No Urinary tract infection?: No Sexually transmitted disease?: No Injury to kidneys or bladder?: No Painful intercourse?: No Weak stream?: No Currently pregnant?: No Vaginal bleeding?: No Last menstrual period?: n  Gastrointestinal Nausea?: No Vomiting?: No Indigestion/heartburn?: Yes Diarrhea?: No Constipation?: Yes  Constitutional Fever: No Night sweats?: No Weight loss?: No Fatigue?: No  Skin Skin rash/lesions?: No Itching?: No  Eyes Blurred vision?: No Double vision?: No  Ears/Nose/Throat Sore throat?: No Sinus problems?: Yes  Hematologic/Lymphatic Swollen glands?: No Easy bruising?: No  Cardiovascular Leg swelling?: No Chest pain?: No  Respiratory Cough?: No Shortness of breath?: Yes  Endocrine Excessive thirst?: No  Musculoskeletal Back pain?: Yes Joint pain?: Yes  Neurological Headaches?: Yes Dizziness?: No  Psychologic Depression?: No Anxiety?: No  Physical Exam: BP (!) 142/74   Pulse 86   Ht 5\' 7"  (1.702 m)   Wt 209 lb (94.8 kg)   BMI 32.73 kg/m     Constitutional:  Alert and oriented, No acute distress. HEENT:  AT, moist mucus membranes.  Trachea midline, no masses. Cardiovascular: No clubbing, cyanosis, or edema. Respiratory: Normal respiratory effort, no increased work of breathing. GI: Abdomen is soft, nontender, nondistended, no abdominal masses.  Obese abdomen. GU: No CVA tenderness.  Skin: No rashes, bruises or suspicious lesions. Lymph: No cervicall adenopathy. Neurologic: Grossly intact, no focal deficits, moving all 4 extremities. Psychiatric: Normal mood and affect.  Laboratory Data: Lab Results  Component Value Date   WBC 6.5 04/15/2016   HGB 12.3 04/15/2016   HCT 37.6 04/15/2016   MCV 99.1 04/15/2016   PLT 264 04/15/2016    Cr  as above  Urinalysis Results for orders placed or performed in visit on 07/18/16  Microscopic Examination  Result Value Ref Range   WBC, UA None seen 0 - 5 /hpf   RBC, UA 0-2 0 - 2 /hpf  Epithelial Cells (non renal) 0-10 0 - 10 /hpf   Bacteria, UA None seen None seen/Few  Urinalysis, Complete  Result Value Ref Range   Specific Gravity, UA 1.020 1.005 - 1.030   pH, UA 6.0 5.0 - 7.5   Color, UA Yellow Yellow   Appearance Ur Hazy (A) Clear   Leukocytes, UA Negative Negative   Protein, UA Negative Negative/Trace   Glucose, UA Negative Negative   Ketones, UA Negative Negative   RBC, UA Negative Negative   Bilirubin, UA Negative Negative   Urobilinogen, Ur 0.2 0.2 - 1.0 mg/dL   Nitrite, UA Negative Negative   Microscopic Examination See below:     Pertinent Imaging: CLINICAL DATA:  Acute renal insufficiency.  EXAM: RENAL / URINARY TRACT ULTRASOUND COMPLETE  COMPARISON:  None.  FINDINGS: Right Kidney:  Length: 13.4 cm. Echogenicity within normal limits. No mass visualized. Moderate right hydronephrosis is noted.  Left Kidney:  Length: 12.1 cm. Echogenicity within normal limits. No mass visualized. Moderate left hydronephrosis is  noted.  Bladder:  Appears normal for degree of bladder distention. Right ureteral jet was not visualized.  IMPRESSION: Moderate bilateral hydronephrosis is noted.   Electronically Signed   By: Marijo Conception, M.D.   On: 07/15/2016 11:50  Renal ultrasound was personally reviewed today. This is also discussed with Dr. Nyoka Cowden who helped calculate bladder volume of 90 cc. Patient did not feel that she could void at the time of the study due to under distention of her bladder.   Assessment & Plan:    1. Bilateral hydronephrosis Asymptomatic incidental moderate bilateral hydronephrosis in the setting of the decompressed bladder Findings reviewed today with the patient I recommend further assessment with cross-sectional imaging in the form of CT urogram to further define the anatomy of the collecting system DDx diagnosis was discussed today with the patient in detail - Urinalysis, Complete - CT HEMATURIA WORKUP; Future  2. Acute kidney insufficiency Resolved, likely secondary to acute illness Most recent Cr back to baseline   Return in about 2 weeks (around 08/01/2016) for f/u CT urogram.  Hollice Espy, MD  [place] [address], [place], [place] [address] [phone]

## 2016-07-19 ENCOUNTER — Ambulatory Visit: Payer: Medicare Other

## 2016-07-24 ENCOUNTER — Telehealth: Payer: Self-pay | Admitting: Urology

## 2016-07-24 NOTE — Telephone Encounter (Signed)
Pt called and is very nervous about having CT done.  The last time, it made her dizzy, sick and she couldn't drive home.  She wants to know if she can have something to calm her.  She wants to know if there is anything different she could do, not the CT.  I think honestly she is confused with the open MRI and closed MRI.  I tried to explain how the CT was done, but she didn't understand.  She is scheduled for Monday 2/12.  Please advise.

## 2016-07-25 NOTE — Telephone Encounter (Signed)
Encouraged patient to have her CT scan done as discussed in the office. She was recommended to have a driver and she does have a reaction. Her reaction is nonlife threatening and not consistent with anaphylaxis.  Ultimately, she was agreeable to having this done.  Hollice Espy, MD

## 2016-07-29 ENCOUNTER — Ambulatory Visit
Admission: RE | Admit: 2016-07-29 | Discharge: 2016-07-29 | Disposition: A | Discharge status: Home / Self Care | Payer: Medicare Other | Source: Ambulatory Visit | Attending: Urology | Admitting: Urology

## 2016-07-29 DIAGNOSIS — N1330 Unspecified hydronephrosis: Secondary | ICD-10-CM | POA: Insufficient documentation

## 2016-07-29 DIAGNOSIS — D71 Functional disorders of polymorphonuclear neutrophils: Secondary | ICD-10-CM | POA: Diagnosis not present

## 2016-07-29 DIAGNOSIS — Q6102 Congenital multiple renal cysts: Secondary | ICD-10-CM | POA: Diagnosis not present

## 2016-07-29 DIAGNOSIS — I700 Atherosclerosis of aorta: Secondary | ICD-10-CM | POA: Diagnosis not present

## 2016-07-29 MED ORDER — IOPAMIDOL (ISOVUE-300) INJECTION 61%
125.0000 mL | Freq: Once | INTRAVENOUS | Status: AC | PRN
  Administered 2016-07-29: 125 mL via INTRAVENOUS

## 2016-07-30 ENCOUNTER — Telehealth: Payer: Self-pay | Admitting: Urology

## 2016-07-30 NOTE — Telephone Encounter (Signed)
CT urogram was reviewed today with the patient by telephone and personally. There is no evidence of hydronephrosis or obstruction. In fact, she has multiple bilateral peripelvic cyst which gave the appearance of hydronephrosis on ultrasound. I assured her that this is benign and does not require any further workup or surveillance. She is quite pleased. We will cancel her follow-up later this week.  Hollice Espy, MD

## 2016-08-01 ENCOUNTER — Ambulatory Visit: Payer: Medicare Other | Admitting: Urology

## 2016-08-06 ENCOUNTER — Ambulatory Visit
Admission: RE | Admit: 2016-08-06 | Discharge: 2016-08-06 | Disposition: A | Discharge status: Home / Self Care | Payer: Medicare Other | Source: Ambulatory Visit | Attending: Internal Medicine | Admitting: Internal Medicine

## 2016-08-06 DIAGNOSIS — Z1231 Encounter for screening mammogram for malignant neoplasm of breast: Secondary | ICD-10-CM | POA: Diagnosis present

## 2016-08-08 ENCOUNTER — Ambulatory Visit
Admission: RE | Admit: 2016-08-08 | Discharge: 2016-08-08 | Disposition: A | Discharge status: Home / Self Care | Payer: Medicare Other | Source: Ambulatory Visit | Attending: Internal Medicine | Admitting: Internal Medicine

## 2016-08-08 DIAGNOSIS — M8588 Other specified disorders of bone density and structure, other site: Secondary | ICD-10-CM | POA: Insufficient documentation

## 2016-08-08 DIAGNOSIS — M810 Age-related osteoporosis without current pathological fracture: Secondary | ICD-10-CM | POA: Diagnosis present

## 2016-08-24 DIAGNOSIS — I10 Essential (primary) hypertension: Secondary | ICD-10-CM | POA: Insufficient documentation

## 2016-08-24 DIAGNOSIS — M51369 Other intervertebral disc degeneration, lumbar region without mention of lumbar back pain or lower extremity pain: Secondary | ICD-10-CM | POA: Insufficient documentation

## 2016-08-24 DIAGNOSIS — M8589 Other specified disorders of bone density and structure, multiple sites: Secondary | ICD-10-CM | POA: Insufficient documentation

## 2016-08-24 DIAGNOSIS — M5136 Other intervertebral disc degeneration, lumbar region: Secondary | ICD-10-CM | POA: Insufficient documentation

## 2016-08-24 DIAGNOSIS — E039 Hypothyroidism, unspecified: Secondary | ICD-10-CM | POA: Insufficient documentation

## 2016-08-24 DIAGNOSIS — M7918 Myalgia, other site: Secondary | ICD-10-CM | POA: Insufficient documentation

## 2016-10-24 ENCOUNTER — Ambulatory Visit: Payer: Medicare Other | Admitting: Pain Medicine

## 2016-11-05 DIAGNOSIS — R7303 Prediabetes: Secondary | ICD-10-CM | POA: Insufficient documentation

## 2016-12-06 ENCOUNTER — Other Ambulatory Visit: Payer: Self-pay | Admitting: Surgery

## 2016-12-06 DIAGNOSIS — M7581 Other shoulder lesions, right shoulder: Secondary | ICD-10-CM

## 2016-12-06 DIAGNOSIS — M19011 Primary osteoarthritis, right shoulder: Secondary | ICD-10-CM

## 2016-12-06 DIAGNOSIS — M75111 Incomplete rotator cuff tear or rupture of right shoulder, not specified as traumatic: Secondary | ICD-10-CM

## 2016-12-13 DIAGNOSIS — R300 Dysuria: Secondary | ICD-10-CM | POA: Insufficient documentation

## 2017-01-01 ENCOUNTER — Ambulatory Visit
Admission: RE | Admit: 2017-01-01 | Discharge: 2017-01-01 | Disposition: A | Discharge status: Home / Self Care | Payer: Medicare Other | Source: Ambulatory Visit | Attending: Surgery | Admitting: Surgery

## 2017-01-01 DIAGNOSIS — M7581 Other shoulder lesions, right shoulder: Secondary | ICD-10-CM | POA: Diagnosis present

## 2017-01-01 DIAGNOSIS — M19011 Primary osteoarthritis, right shoulder: Secondary | ICD-10-CM | POA: Insufficient documentation

## 2017-01-01 DIAGNOSIS — M75111 Incomplete rotator cuff tear or rupture of right shoulder, not specified as traumatic: Secondary | ICD-10-CM | POA: Insufficient documentation

## 2017-02-04 DIAGNOSIS — M545 Low back pain, unspecified: Secondary | ICD-10-CM | POA: Insufficient documentation

## 2017-03-04 ENCOUNTER — Other Ambulatory Visit: Payer: Self-pay | Admitting: Physician Assistant

## 2017-03-04 DIAGNOSIS — M5442 Lumbago with sciatica, left side: Secondary | ICD-10-CM

## 2017-03-10 ENCOUNTER — Other Ambulatory Visit: Payer: Self-pay | Admitting: Internal Medicine

## 2017-03-10 DIAGNOSIS — M9933 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: Secondary | ICD-10-CM | POA: Insufficient documentation

## 2017-03-18 ENCOUNTER — Ambulatory Visit
Admission: RE | Admit: 2017-03-18 | Discharge: 2017-03-18 | Disposition: A | Discharge status: Home / Self Care | Payer: Medicare Other | Source: Ambulatory Visit | Attending: Physician Assistant | Admitting: Physician Assistant

## 2017-03-18 DIAGNOSIS — M5442 Lumbago with sciatica, left side: Secondary | ICD-10-CM

## 2017-03-19 DIAGNOSIS — M4807 Spinal stenosis, lumbosacral region: Secondary | ICD-10-CM | POA: Insufficient documentation

## 2017-03-19 DIAGNOSIS — M48062 Spinal stenosis, lumbar region with neurogenic claudication: Secondary | ICD-10-CM | POA: Insufficient documentation

## 2017-04-14 ENCOUNTER — Ambulatory Visit
Admission: RE | Admit: 2017-04-14 | Discharge: 2017-04-14 | Disposition: A | Discharge status: Home / Self Care | Payer: Medicare Other | Source: Ambulatory Visit | Attending: Student in an Organized Health Care Education/Training Program | Admitting: Student in an Organized Health Care Education/Training Program

## 2017-04-14 ENCOUNTER — Encounter: Payer: Self-pay | Admitting: Student in an Organized Health Care Education/Training Program

## 2017-04-14 ENCOUNTER — Ambulatory Visit (HOSPITAL_BASED_OUTPATIENT_CLINIC_OR_DEPARTMENT_OTHER): Payer: Medicare Other | Admitting: Student in an Organized Health Care Education/Training Program

## 2017-04-14 VITALS — BP 143/85 | HR 74 | Temp 98.2°F | Resp 18 | Ht 67.0 in | Wt 212.0 lb

## 2017-04-14 DIAGNOSIS — M5442 Lumbago with sciatica, left side: Secondary | ICD-10-CM | POA: Insufficient documentation

## 2017-04-14 DIAGNOSIS — M5136 Other intervertebral disc degeneration, lumbar region: Secondary | ICD-10-CM

## 2017-04-14 DIAGNOSIS — K6289 Other specified diseases of anus and rectum: Secondary | ICD-10-CM | POA: Diagnosis present

## 2017-04-14 DIAGNOSIS — M9983 Other biomechanical lesions of lumbar region: Secondary | ICD-10-CM

## 2017-04-14 DIAGNOSIS — G8929 Other chronic pain: Secondary | ICD-10-CM

## 2017-04-14 DIAGNOSIS — Z885 Allergy status to narcotic agent status: Secondary | ICD-10-CM | POA: Insufficient documentation

## 2017-04-14 DIAGNOSIS — M4856XA Collapsed vertebra, not elsewhere classified, lumbar region, initial encounter for fracture: Secondary | ICD-10-CM | POA: Insufficient documentation

## 2017-04-14 DIAGNOSIS — M5116 Intervertebral disc disorders with radiculopathy, lumbar region: Secondary | ICD-10-CM | POA: Diagnosis not present

## 2017-04-14 DIAGNOSIS — M25511 Pain in right shoulder: Secondary | ICD-10-CM | POA: Diagnosis not present

## 2017-04-14 DIAGNOSIS — M5416 Radiculopathy, lumbar region: Secondary | ICD-10-CM | POA: Diagnosis present

## 2017-04-14 DIAGNOSIS — M48061 Spinal stenosis, lumbar region without neurogenic claudication: Secondary | ICD-10-CM | POA: Insufficient documentation

## 2017-04-14 DIAGNOSIS — M79606 Pain in leg, unspecified: Secondary | ICD-10-CM | POA: Insufficient documentation

## 2017-04-14 MED ORDER — LIDOCAINE HCL (PF) 1 % IJ SOLN
10.0000 mL | Freq: Once | INTRAMUSCULAR | Status: AC
  Administered 2017-04-14: 5 mL
  Filled 2017-04-14: qty 10

## 2017-04-14 MED ORDER — ROPIVACAINE HCL 2 MG/ML IJ SOLN
2.0000 mL | Freq: Once | INTRAMUSCULAR | Status: AC
  Administered 2017-04-14: 10 mL via EPIDURAL
  Filled 2017-04-14: qty 10

## 2017-04-14 MED ORDER — SODIUM CHLORIDE 0.9% FLUSH
2.0000 mL | Freq: Once | INTRAVENOUS | Status: AC
  Administered 2017-04-14: 10 mL

## 2017-04-14 MED ORDER — IOPAMIDOL (ISOVUE-M 200) INJECTION 41%
10.0000 mL | Freq: Once | INTRAMUSCULAR | Status: AC
  Administered 2017-04-14: 10 mL via EPIDURAL
  Filled 2017-04-14: qty 10

## 2017-04-14 MED ORDER — DEXAMETHASONE SODIUM PHOSPHATE 10 MG/ML IJ SOLN
10.0000 mg | Freq: Once | INTRAMUSCULAR | Status: AC
  Administered 2017-04-14: 10 mg
  Filled 2017-04-14: qty 1

## 2017-04-14 NOTE — Progress Notes (Signed)
Patient's Name: Erika Crawford  MRN: [id number]  Referring Provider: Meade Maw, MD  DOB: September 06, 1940  PCP: Glendon Axe, MD  DOS: 04/14/2017  Note by: [name], MD  Service setting: Ambulatory outpatient  Specialty: Interventional Pain Management  Patient type: Established  Location: [facility] (AMB) Pain Management Facility  Visit type: Interventional Procedure   Primary Reason for Visit: Interventional Pain Management Treatment. CC: Rectal Pain (buttocks pain) and Leg Pain (cramps)  Procedure:  Anesthesia, Analgesia, Anxiolysis:  Type: Diagnostic Inter-Laminar Epidural Steroid Injection Region: Lumbar Level: L4-5 Level. Laterality: Left         Type: Local Anesthesia Local Anesthetic: Lidocaine 1% Route: Infiltration ([place]/IM) IV Access: Declined Sedation: Meaningful verbal contact was maintained at all times during the procedure  Indication(s): Analgesia and Anxiety   Indications: 1. Lumbar radiculopathy   2. Lumbar foraminal stenosis   3. Lumbar degenerative disc disease   4. Chronic left-sided low back pain with left-sided sciatica    Pain Score: Pre-procedure: 0-No pain/10 Post-procedure: 0-No pain/10  Pre-op Assessment:  Erika Crawford is a 76 y.o. (year old), female patient, seen today for interventional treatment. 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 Erika Crawford has a current medication list which includes the following prescription(s): acetaminophen, alendronate, amlodipine-valsartan, calcium carbonate-vitamin d, cyclobenzaprine, diclofenac, levothyroxine, mometasone, valsartan, ergocalciferol, diclofenac sodium, omeprazole, senna-docusate, and tapentadol. Her primarily concern today is the Rectal Pain (buttocks pain) and Leg Pain (cramps)  Initial Vital Signs: Blood pressure 139/68, pulse 69, temperature 98.2 F  (36.8 C), temperature source Oral, resp. rate 16, height 5\' 7"  (1.702 m), weight 212 lb (96.2 kg), SpO2 98 %. BMI: Estimated body mass index is 33.2 kg/m as calculated from the following:   Height as of this encounter: 5\' 7"  (1.702 m).   Weight as of this encounter: 212 lb (96.2 kg).  Risk Assessment: Allergies: Reviewed. She is allergic to celebrex [celecoxib]; codeine; etodolac; hydrochlorothiazide; oxycodone; pravastatin; relafen [nabumetone]; and tramadol.  Allergy Precautions: None required Coagulopathies: Reviewed. None identified.  Blood-thinner therapy: None at this time Active Infection(s): Reviewed. None identified. Erika Crawford is afebrile  Site Confirmation: Erika Crawford was asked to confirm the procedure and laterality before marking the site Procedure checklist: Completed Consent: Before the procedure and under the influence of no sedative(s), amnesic(s), or anxiolytics, the patient was informed of the treatment options, risks and possible complications. To fulfill our ethical and legal obligations, as recommended by the American Medical Association's Code of Ethics, I have informed the patient of my clinical impression; the nature and purpose of the treatment or procedure; the risks, benefits, and possible complications of the intervention; the alternatives, including doing nothing; the risk(s) and benefit(s) of the alternative treatment(s) or procedure(s); and the risk(s) and benefit(s) of doing nothing. The patient was provided information about the general risks and possible complications associated with the procedure. These may include, but are not limited to: failure to achieve desired goals, infection, bleeding, organ or nerve damage, allergic reactions, paralysis, and death. In addition, the patient was informed of those risks and complications associated to Spine-related procedures, such as failure to decrease pain; infection (i.e.: Meningitis, epidural or intraspinal abscess);  bleeding (i.e.: epidural hematoma, subarachnoid hemorrhage, or any other type of intraspinal or peri-dural bleeding); organ or nerve damage (i.e.: Any type of peripheral nerve, nerve root, or spinal cord injury) with subsequent damage to sensory, motor, and/or autonomic systems,  resulting in permanent pain, numbness, and/or weakness of one or several areas of the body; allergic reactions; (i.e.: anaphylactic reaction); and/or death. Furthermore, the patient was informed of those risks and complications associated with the medications. These include, but are not limited to: allergic reactions (i.e.: anaphylactic or anaphylactoid reaction(s)); adrenal axis suppression; blood sugar elevation that in diabetics may result in ketoacidosis or comma; water retention that in patients with history of congestive heart failure may result in shortness of breath, pulmonary edema, and decompensation with resultant heart failure; weight gain; swelling or edema; medication-induced neural toxicity; particulate matter embolism and blood vessel occlusion with resultant organ, and/or nervous system infarction; and/or aseptic necrosis of one or more joints. Finally, the patient was informed that Medicine is not an exact science; therefore, there is also the possibility of unforeseen or unpredictable risks and/or possible complications that may result in a catastrophic outcome. The patient indicated having understood very clearly. We have given the patient no guarantees and we have made no promises. Enough time was given to the patient to ask questions, all of which were answered to the patient's satisfaction. Erika Crawford has indicated that she wanted to continue with the procedure. Attestation: I, the ordering provider, attest that I have discussed with the patient the benefits, risks, side-effects, alternatives, likelihood of achieving goals, and potential problems during recovery for the procedure that I have provided informed  consent. Date: 04/14/2017; Time: 9:56 AM  Pre-Procedure Preparation:  Monitoring: As per clinic protocol. Respiration, ETCO2, SpO2, BP, heart rate and rhythm monitor placed and checked for adequate function Safety Precautions: Patient was assessed for positional comfort and pressure points before starting the procedure. Time-out: I initiated and conducted the "Time-out" before starting the procedure, as per protocol. The patient was asked to participate by confirming the accuracy of the "Time Out" information. Verification of the correct person, site, and procedure were performed and confirmed by me, the nursing staff, and the patient. "Time-out" conducted as per Joint Commission's Universal Protocol (UP.01.01.01). "Time-out" Date & Time: 04/14/2017; 1011 hrs.  Description of Procedure Process:   Position: Prone with head of the table was raised to facilitate breathing. Target Area: The interlaminar space, initially targeting the lower laminar border of the superior vertebral body. Approach: Paramedial approach. Area Prepped: Entire Posterior Lumbar Region Prepping solution: ChloraPrep (2% chlorhexidine gluconate and 70% isopropyl alcohol) Safety Precautions: Aspiration looking for blood return was conducted prior to all injections. At no point did we inject any substances, as a needle was being advanced. No attempts were made at seeking any paresthesias. Safe injection practices and needle disposal techniques used. Medications properly checked for expiration dates. SDV (single dose vial) medications used. Description of the Procedure: Protocol guidelines were followed. The procedure needle was introduced through the skin, ipsilateral to the reported pain, and advanced to the target area. Bone was contacted and the needle walked caudad, until the lamina was cleared. The epidural space was identified using "loss-of-resistance technique" with 2-3 ml of PF-NaCl (0.9% NSS), in a 5cc LOR glass  syringe. Vitals:   04/14/17 1018 04/14/17 1024 04/14/17 1028 04/14/17 1035  BP: (!) 168/96 (!) 170/89 (!) 165/76 (!) 143/85  Pulse: 95 94 98 74  Resp: 20 20 19 18   Temp:      TempSrc:      SpO2: 100% 100% 99% 100%  Weight:      Height:        Start Time: 1012 hrs. End Time: 1029 hrs. Materials:  Needle(s) Type: Epidural  needle Gauge: 17G Length: 3.5-in Medication(s): We administered iopamidol, ropivacaine (PF) 2 mg/mL (0.2%), sodium chloride flush, lidocaine (PF), and dexamethasone. Please see chart orders for dosing details. 7 cc: 5 cc of preservative-free normal saline, 1 cc of 0.2% ropivacaine, 1 cc of Decadron Imaging Guidance (Spinal):  Type of Imaging Technique: Fluoroscopy Guidance (Spinal) Indication(s): Assistance in needle guidance and placement for procedures requiring needle placement in or near specific anatomical locations not easily accessible without such assistance. Exposure Time: Please see nurses notes. Contrast: Before injecting any contrast, we confirmed that the patient did not have an allergy to iodine, shellfish, or radiological contrast. Once satisfactory needle placement was completed at the desired level, radiological contrast was injected. Contrast injected under live fluoroscopy. No contrast complications. See chart for type and volume of contrast used. Fluoroscopic Guidance: I was personally present during the use of fluoroscopy. "Tunnel Vision Technique" used to obtain the best possible view of the target area. Parallax error corrected before commencing the procedure. "Direction-depth-direction" technique used to introduce the needle under continuous pulsed fluoroscopy. Once target was reached, antero-posterior, oblique, and lateral fluoroscopic projection used confirm needle placement in all planes. Images permanently stored in EMR. Interpretation: I personally interpreted the imaging intraoperatively. Adequate needle placement confirmed in multiple planes.  Appropriate spread of contrast into desired area was observed. No evidence of afferent or efferent intravascular uptake. No intrathecal or subarachnoid spread observed. Permanent images saved into the patient's record.  Antibiotic Prophylaxis:  Indication(s): None identified Antibiotic given: None  Post-operative Assessment:  EBL: None Complications: No immediate post-treatment complications observed by team, or reported by patient. Note: The patient tolerated the entire procedure well. A repeat set of vitals were taken after the procedure and the patient was kept under observation following institutional policy, for this type of procedure. Post-procedural neurological assessment was performed, showing return to baseline, prior to discharge. The patient was provided with post-procedure discharge instructions, including a section on how to identify potential problems. Should any problems arise concerning this procedure, the patient was given instructions to immediately contact us, at any time, without hesitation. In any case, we plan to contact the patient by telephone for a follow-up status report regarding this interventional procedure. Comments:  No additional relevant information. 5 out of 5 strength bilateral lower extremity: Plantar flexion, dorsiflexion, knee flexion, knee extension. Plan of Care  Follow-up in 2 weeks for postprocedure evaluation.  Imaging Orders     DG C-Arm 1-60 Min-No Report Procedure Orders    No procedure(s) ordered today    Medications ordered for procedure: Meds ordered this encounter  Medications  . iopamidol (ISOVUE-M) 41 % intrathecal injection 10 mL  . ropivacaine (PF) 2 mg/mL (0.2%) (NAROPIN) injection 2 mL  . sodium chloride flush (NS) 0.9 % injection 2 mL  . lidocaine (PF) (XYLOCAINE) 1 % injection 10 mL  . dexamethasone (DECADRON) injection 10 mg   Medications administered: We administered iopamidol, ropivacaine (PF) 2 mg/mL (0.2%), sodium chloride  flush, lidocaine (PF), and dexamethasone.  See the medical record for exact dosing, route, and time of administration.  New Prescriptions   No medications on file   Disposition: Discharge home  Discharge Date & Time: 04/14/2017; 1040 hrs.   Physician-requested Follow-up: Return in about 2 weeks (around 04/28/2017) for Post Procedure Evaluation. Future Appointments Date Time Provider [place]  04/29/2017 11:30 AM [name], MD [facility] None   Primary Care Physician: Glendon Axe, MD Location: [facility] Note by: [name], MD Date:  04/14/2017; Time: 12:20 PM  Disclaimer:  Medicine is not an Chief Strategy Officer. The only guarantee in medicine is that nothing is guaranteed. It is important to note that the decision to proceed with this intervention was based on the information collected from the patient. The Data and conclusions were drawn from the patient's questionnaire, the interview, and the physical examination. Because the information was provided in large part by the patient, it cannot be guaranteed that it has not been purposely or unconsciously manipulated. Every effort has been made to obtain as much relevant data as possible for this evaluation. It is important to note that the conclusions that lead to this procedure are derived in large part from the available data. Always take into account that the treatment will also be dependent on availability of resources and existing treatment guidelines, considered by other Pain Management Practitioners as being common knowledge and practice, at the time of the intervention. For Medico-Legal purposes, it is also important to point out that variation in procedural techniques and pharmacological choices are the acceptable norm. The indications, contraindications, technique, and results of the above procedure should only be interpreted and judged by a Board-Certified Interventional Pain Specialist with  extensive familiarity and expertise in the same exact procedure and technique.

## 2017-04-14 NOTE — Progress Notes (Signed)
Patient's Name: Erika Crawford  MRN: [id number]  Referring Provider: Yarbrough, Chester, MD  DOB: 10/11/1940  PCP: Singh, Jasmine, MD  DOS: 04/14/2017  Note by: Erika Lateef, MD  Service setting: Ambulatory outpatient  Specialty: Interventional Pain Management  Location: [facility] (AMB) Pain Management Facility    Patient type: New patient ("FAST-TRACK" Evaluation)   Warning: This referral option does not include the extensive pharmacological evaluation required for us to take over the patient's medication management. The "Fast-Track" system is designed to bypass the new patient referral waiting list, as well as the normal patient evaluation process, in order to provide a patient in distress with a timely pain management intervention. Because the system was not designed to unfairly get a patient into our pain practice ahead of those already waiting, certain restrictions apply. By requesting a "Fast-Track" consult, the referring physician has opted to continue managing the patient's medications in order to get interventional urgent care.  Primary Reason for Visit: Interventional Pain Management Treatment. CC: Rectal Pain (buttocks pain) and Leg Pain (cramps)   Procedure  HPI  Erika Crawford is a 76 y.o. year old, female patient, who comes today for a  "Fast-Track" new patient evaluation, as requested by Yarbrough, Chester, MD. The patient has been made aware that this type of referral option is reserved for the Interventional Pain Management portion of our practice and completely excludes the option of medication management. Her primarily concern today is the Rectal Pain (buttocks pain) and Leg Pain (cramps)  Pain Assessment: Location: Left Buttocks Radiating:  (cramping in legs and feet at night. ) Onset: More than a month ago (pain began approx 6 - 7 weeks ago and patient was unable to walk.  pain is better now from having gone through PT) Duration: Chronic pain Quality: Cramping, Sharp Severity:  0-No pain/10 (self-reported pain score)  Note: Reported level is compatible with observation.                         When using our objective Pain Scale, levels between 6 and 10/10 are said to belong in an emergency room, as it progressively worsens from a 6/10, described as severely limiting, requiring emergency care not usually available at an outpatient pain management facility. At a 6/10 level, communication becomes difficult and requires great effort. Assistance to reach the emergency department may be required. Facial flushing and profuse sweating along with potentially dangerous increases in heart rate and blood pressure will be evident. Effect on ADL: walking aggravated the pain, if patient is sitting or laying the pain is absent Timing: Constant Modifying factors: patient has been going to PT which has helped the pain tremendously.   Onset and Duration: Present less than 3 months Cause of pain: Unknown Severity: Getting better, NAS-11 at its worse: 9/10, NAS-11 at its best: 0/10, NAS-11 now: 0/10 and NAS-11 on the average: 2/10 Timing: Not influenced by the time of the day Aggravating Factors: Bending, Kneeling, Lifiting, Motion, Nerve blocks, Prolonged standing, Stooping , Twisting, Walking, Walking uphill and Walking downhill Alleviating Factors: physical therapy Associated Problems: Constipation, Night-time cramps, Depression, Inability to concentrate, Personality changes, Sadness and Spasms Quality of Pain: Sharp, Stabbing, Tender, Toothache-like and Uncomfortable Previous Examinations or Tests: Bone scan, Cutaneous Pain Threshold Testing (CPT) and MRI scan Previous Treatments: Physical Therapy  The patient comes into the clinics today, referred to us for a lumbar epidural steroid injection secondary to lumbar radiculopathy.  Meds   Current Outpatient Prescriptions:  .    acetaminophen (TYLENOL) 500 MG tablet, Take 500 mg by mouth every 6 (six) hours as needed., Disp: , Rfl:  .   alendronate (FOSAMAX) 70 MG tablet, Take 70 mg by mouth once a week. Take with a full glass of water on an empty stomach., Disp: , Rfl:  .  amLODipine-valsartan (EXFORGE) 5-320 MG tablet, Take 1 tablet by mouth daily., Disp: , Rfl:  .  Calcium Carbonate-Vitamin D (CALTRATE 600+D) 600-400 MG-UNIT tablet, Take 1 tablet by mouth 2 (two) times daily., Disp: , Rfl:  .  cyclobenzaprine (FLEXERIL) 5 MG tablet, Take 1-2 tablets by mouth 3 (three) times daily., Disp: , Rfl:  .  diclofenac (VOLTAREN) 50 MG EC tablet, Take 50 mg by mouth 2 (two) times daily., Disp: , Rfl:  .  levothyroxine (SYNTHROID, LEVOTHROID) 100 MCG tablet, Take 100 mcg by mouth daily before breakfast., Disp: , Rfl:  .  mometasone (NASONEX) 50 MCG/ACT nasal spray, Place 2 sprays into the nose daily., Disp: , Rfl:  .  valsartan (DIOVAN) 320 MG tablet, Take 320 mg by mouth daily., Disp: , Rfl:  .  VITAMIN D, ERGOCALCIFEROL, PO, Take by mouth 2 (two) times daily., Disp: , Rfl:  .  diclofenac sodium (VOLTAREN) 1 % GEL, Apply topically 3 (three) times daily., Disp: , Rfl:  .  omeprazole (PRILOSEC) 20 MG capsule, Take 20 mg by mouth daily., Disp: , Rfl:  .  senna-docusate (SENOKOT-S) 8.6-50 MG tablet, Take 1 tablet by mouth 3 times/day as needed-between meals & bedtime., Disp: , Rfl:  .  tapentadol (NUCYNTA) 50 MG tablet, Take 1 tablet (50 mg total) by mouth every 4 (four) hours as needed for moderate pain. (Patient not taking: Reported on 04/14/2017), Disp: 30 tablet, Rfl: 0  Imaging Review   Cervical DG complete:  Results for orders placed in visit on 03/16/14  DG Cervical Spine Complete   Narrative   CLINICAL DATA:  76 year old female neck pain radiating to both arms.   EXAM:  CERVICAL SPINE  4+ VIEWS   COMPARISON:  01/29/2014 thoracic spine.   FINDINGS:  Normal alignment noted.   Moderate multilevel degenerative disc disease, spondylosis and facet  arthropathy throughout the cervical spine identified. This  contributes to  mild bony foraminal narrowing on the right at C4-5  and C5-6 and mild to moderate bony foraminal narrowing on the left  from C3-C7.   No focal bony abnormalities are present.   IMPRESSION:  No evidence of acute abnormality.   Moderate degenerative changes throughout the cervical spine  contributing to bony foraminal narrowing as discussed above.    Electronically Signed    By: Hassan Rowan M.D.    On: 03/16/2014 14:27        Results for orders placed during the hospital encounter of 01/01/17  MR SHOULDER RIGHT WO CONTRAST   Narrative CLINICAL DATA:  Right shoulder pain and decreased range of motion, chronic. No known injury.  EXAM: MRI OF THE RIGHT SHOULDER WITHOUT CONTRAST  TECHNIQUE: Multiplanar, multisequence MR imaging of the shoulder was performed. No intravenous contrast was administered.  COMPARISON:  MRI right shoulder 11/01/2015.  FINDINGS: Rotator cuff: Rotator cuff tendinopathy appearing worst in the supraspinatus is again seen. Interstitial tear of the posterior far lateral supraspinatus measures 0.5 cm from front to back. No full-thickness tear or retracted tendon. The tear is slightly larger than on the prior exam where it measured 0.4 cm.  Muscles:  No focal atrophy or lesion.  Biceps long head: Intact. Mild tendinosis of  the intra-articular segment is noted.  Acromioclavicular Joint: Moderate degenerative disease is unchanged in appearance. Type 2 Acromion. No subacromial/subdeltoid bursal fluid.  Glenohumeral Joint: Advanced degenerative disease is again seen with denuding of hyaline cartilage. Mild subchondral edema about the joint is slightly worse on the prior examination and a large osteophyte off the humeral head has slightly increased in size.  Labrum:  Degenerated without discrete tear.  Bones:  No fracture or worrisome lesion.  Other: None.  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  Rotator cuff tendinopathy with a 0.5 cm from front to back interstitial tear of the supraspinatus at the greater tuberosity. The tear is slightly larger than on the prior exam.  Moderate acromioclavicular osteoarthritis.   Electronically Signed   By: Thomas  Dalessio M.D.   On: 01/01/2017 16:14     Shoulder-R DG:  Results for orders placed in visit on 07/17/09  DG Shoulder Right   Narrative   PRIOR REPORT IMPORTED FROM THE [employer] WORKFLOW SYSTEM   REASON FOR EXAM:    Right Shoulder Pain  COMMENTS:   PROCEDURE:     DXR - DXR SHOULDER RIGHT COMPLETE  - Jul 17 2009 12:50PM   RESULT:     There does not appear to be evidence of fracture, dislocation  or  malalignment.  If persistent clinical concern or persistent complaints of  pain, repeat evaluation in 7-10 days is recommended or alternatively  further  evaluation with MRI.   IMPRESSION:   Unremarkable right shoulder.   Thank you for the opportunity to contribute to the care of your patient.       Thoracic DG 2-3 views:  Results for orders placed in visit on 01/29/14  DG Thoracic Spine 2 View   Narrative   CLINICAL DATA:  Fell 1 week ago.  Back pain.   EXAM:  THORACIC SPINE - 2 VIEW   COMPARISON:  Lateral chest radiograph, 03/30/2013   FINDINGS:  No fracture or spondylolisthesis. There are stable degenerative  changes throughout the thoracic spine. Bones are demineralized. Soft  tissues are unremarkable.   IMPRESSION:  No fracture or acute finding.    Electronically Signed    By: David  Ormond M.D.    On: 01/29/2014 12:10        Lumbosacral Imaging: Lumbar MR wo contrast:  Results for orders placed during the hospital encounter of 03/18/17  MR LUMBAR SPINE WO CONTRAST   Narrative CLINICAL DATA:  76-year-old female with 5 weeks of lumbar back pain radiating to the left leg.  EXAM: MRI LUMBAR SPINE WITHOUT CONTRAST  TECHNIQUE: Multiplanar, multisequence MR  imaging of the lumbar spine was performed. No intravenous contrast was administered.  COMPARISON:  CT Abdomen and Pelvis 07/29/2016. Lumbar radiographs 01/29/2014.  FINDINGS: Segmentation:  Normal as demonstrated on the comparisons.  Alignment: Stable since February. Chronic straightening of lumbar lordosis. Chronic L2 compression fracture. Grade 1 anterolisthesis of L4 on L5, which has increased since 2015.  Vertebrae: Chronic previously augmented severe L2 compression fracture with retropulsion of bone, stable since 07/29/2016. Background bone marrow signal is normal. No marrow edema or evidence of acute osseous abnormality. Intact visible sacrum and SI joints.  Conus medullaris: Extends to the L1-L2 level. No signal abnormality in the lower thoracic spinal cord or conus.  Paraspinal and other soft tissues: Stable visualized abdominal viscera. Negative visualized posterior paraspinal soft tissues.  Disc levels:  T11-T12:  Mild mostly anterior disc bulging.  T12-L1:    Negative.  L1-L2: Broad-based posterior disc osteophyte complex in part related to retropulsed L2 compression fracture. Mild to moderate facet and ligament flavum hypertrophy. Mild to moderate spinal stenosis - maximal at the mid L2 vertebral level - and right greater than left L1 neural foraminal stenosis.  L2-L3: Circumferential disc osteophyte complex with broad-based posterior component. Mild to moderate facet and ligament flavum hypertrophy. No significant spinal or lateral recess stenosis. Moderate bilateral L2 foraminal stenosis, greater on the right.  L3-L4: Vacuum disc. Circumferential but mostly far lateral disc bulge and endplate spurring. Broad-based posterior component. Mild to moderate facet and ligament flavum hypertrophy. Trace facet joint fluid. No spinal or convincing lateral recess stenosis. Mild bilateral L3 foraminal stenosis, greater on the left.  L4-L5: Vacuum disc. Mild grade 1  anterolisthesis. Left eccentric circumferential disc bulge with broad-based posterior component. Moderate to severe facet and ligament flavum hypertrophy. Capacious facet joints containing fluid. Mild epidural lipomatosis. Small subligamentous synovial cyst on the left (series 6, image 22). Moderate spinal stenosis (series 6, image 21). Moderate to severe left lateral recess stenosis (descending left L5 nerve root level). Mild right lateral recess stenosis. Moderate left and mild right L4 foraminal stenosis.  L5-S1: Vacuum disc. Disc space loss with bulky but mostly far lateral circumferential disc osteophyte complex. Moderate facet and moderate to severe ligament flavum hypertrophy. Moderate to severe bilateral L5 foraminal stenosis. Mild to moderate bilateral lateral recess stenosis (S1 nerve root levels). Mild spinal stenosis.  IMPRESSION: 1. Chronic, previously augmented severe L2 compression fracture with retropulsion of bone contributing to moderate spinal and bilateral foraminal stenosis at the L2 level. 2. L4-L5 grade 1 anterolisthesis with disc and severe posterior element degeneration. Up to moderate spinal, moderate left foraminal, and severe left lateral recess stenosis. Query left L5 radiculitis. 3. Chronic disc and endplate degeneration at L5-S1 with moderate to severe bilateral foraminal stenosis and mild to moderate lateral recess stenosis.   Electronically Signed   By: Genevie Ann M.D.   On: 03/18/2017 14:09     Results for orders placed in visit on 01/29/14  DG Lumbar Spine 2-3 Views   Narrative   CLINICAL DATA:  [place] 1 week ago.  Back pain.   EXAM:  LUMBAR SPINE - 2-3 VIEW   COMPARISON:  None.   FINDINGS:  Moderate compression fracture of L2 of unclear chronicity, but  likely recent given the history.   No other fractures. Grade 1 anterolisthesis of L4 on L5. No other  spondylolisthesis.   Mild loss of disc height at L3-L4, L4-L5 and L5-S1. Small  endplate  osteophytes are noted throughout the lumbar spine.   Bones are demineralized. Soft tissue show phleboliths and changes  from a cholecystectomy.   IMPRESSION:  Moderate compression fracture of L2, likely recent.    Electronically Signed    By: Lajean Manes M.D.    On: 01/29/2014 12:11         Complexity Note: Imaging results reviewed. Results shared with Ms. Omura, using Layman's terms.                         ROS  Cardiovascular History: High blood pressure Pulmonary or Respiratory History: Snoring  and Temporary stoppage of breathing during sleep Neurological History: No reported neurological signs or symptoms such as seizures, abnormal skin sensations, urinary and/or fecal incontinence, being born with an abnormal open spine and/or a tethered spinal cord Review of Past Neurological Studies: No results found for this or  any previous visit. Psychological-Psychiatric History: No reported psychological or psychiatric signs or symptoms such as difficulty sleeping, anxiety, depression, delusions or hallucinations (schizophrenial), mood swings (bipolar disorders) or suicidal ideations or attempts Gastrointestinal History: Reflux or heatburn and Alternating episodes iof diarrhea and constipation (IBS-Irritable bowe syndrome) Genitourinary History: No reported renal or genitourinary signs or symptoms such as difficulty voiding or producing urine, peeing blood, non-functioning kidney, kidney stones, difficulty emptying the bladder, difficulty controlling the flow of urine, or chronic kidney disease Hematological History: No reported hematological signs or symptoms such as prolonged bleeding, low or poor functioning platelets, bruising or bleeding easily, hereditary bleeding problems, low energy levels due to low hemoglobin or being anemic Endocrine History: Slow thyroid Rheumatologic History: Joint aches and or swelling due to excess weight (Osteoarthritis) and Rheumatoid  arthritis Musculoskeletal History: Negative for myasthenia gravis, muscular dystrophy, multiple sclerosis or malignant hyperthermia Work History: Retired  Allergies  Ms. Burkey is allergic to celebrex [celecoxib]; codeine; etodolac; hydrochlorothiazide; oxycodone; pravastatin; relafen [nabumetone]; and tramadol.  Laboratory Chemistry  Inflammation Markers (CRP: Acute Phase) (ESR: Chronic Phase) No results found for: CRP, ESRSEDRATE               Renal Function Markers Lab Results  Component Value Date   BUN 13 04/15/2016   CREATININE 0.64 04/15/2016   GFRAA >60 04/15/2016   GFRNONAA >60 04/15/2016                 Hepatic Function Markers Lab Results  Component Value Date   AST 16 01/19/2014   ALT 25 01/19/2014   ALBUMIN 3.6 01/19/2014   ALKPHOS 62 01/19/2014                 Electrolytes Lab Results  Component Value Date   NA 136 04/15/2016   K 3.3 (L) 04/15/2016   CL 101 04/15/2016   CALCIUM 9.5 04/15/2016                 Neuropathy Markers No results found for: [id number]               Bone Pathology Markers Lab Results  Component Value Date   ALKPHOS 62 01/19/2014   CALCIUM 9.5 04/15/2016                 Coagulation Parameters Lab Results  Component Value Date   INR 1.06 04/15/2016   LABPROT 13.8 04/15/2016   APTT 30 04/15/2016   PLT 264 04/15/2016                 Cardiovascular Markers Lab Results  Component Value Date   HGB 12.3 04/15/2016   HCT 37.6 04/15/2016                 Note: Lab results reviewed.  PFSH  Drug: Ms. Seaborn  reports that she does not use drugs. Alcohol:  reports that she drinks alcohol. Tobacco:  reports that she has never smoked. She has never used smokeless tobacco. Medical:  has a past medical history of Arthritis; Complication of anesthesia; Hypertension; Hypothyroidism; Sleep apnea; and Thyroid disease. Family: family history is not on file.  Past Surgical History:  Procedure Laterality Date  . ABDOMINAL  HYSTERECTOMY    . BACK SURGERY     lower middle kyphoplasty  . COLONOSCOPY WITH PROPOFOL N/A 09/21/2015   Procedure: COLONOSCOPY WITH PROPOFOL;  Surgeon: Hulen Luster, MD;  Location: [facility] ENDOSCOPY;  Service: Gastroenterology;  Laterality: N/A;  . JOINT REPLACEMENT  03/19/2016- left knee replacement  . 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   Procedure: TOTAL KNEE ARTHROPLASTY;  Surgeon: Corky Mull, MD;  Location: [facility] ORS;  Service: Orthopedics;  Laterality: Left;   Active Ambulatory Problems    Diagnosis Date Noted  . 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 Rotator cuff tendinitis, right 11/27/2015  . Rotator cuff tendinitis, left 06/21/2016  . 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  . Osteoarthritis 11/04/2013  . Obstructive sleep apnea on CPAP 07/13/2015  . Injury of tendon of long head of right biceps 11/27/2015  . Incomplete tear of right rotator cuff 11/27/2015  . Disorder of uterus 04/29/1995  . Bilateral hydronephrosis 07/15/2016   Resolved Ambulatory Problems    Diagnosis Date Noted  . No Resolved Ambulatory Problems   Past Medical History:  Diagnosis Date  . Arthritis   . Complication of anesthesia   . Hypertension   . Hypothyroidism   . Sleep apnea   . Thyroid disease    Constitutional Exam  General appearance: Well nourished, well developed, and well hydrated. In no apparent acute distress Vitals:   04/14/17 1018 04/14/17 1024 04/14/17 1028 04/14/17 1035  BP: (!) 168/96 (!) 170/89 (!) 165/76 (!) 143/85  Pulse: 95 94 98 74  Resp: _0 Temp:      TempSrc:      SpO2: 100% 100% 99% 100%  Weight:      Height:       BMI Assessment: Estimated body mass index is 33.2 kg/m as calculated from the following:   Height as of this encounter: 5' 7" (1.702 m).   Weight as of this encounter: 212 lb (96.2 kg).  BMI interpretation table: BMI level Category Range association with higher incidence of chronic pain  <18  kg/m2 Underweight   18.5-24.9 kg/m2 Ideal body weight   25-29.9 kg/m2 Overweight Increased incidence by 20%  30-34.9 kg/m2 Obese (Class I) Increased incidence by 68%  35-39.9 kg/m2 Severe obesity (Class II) Increased incidence by 136%  >40 kg/m2 Extreme obesity (Class III) Increased incidence by 254%   BMI Readings from Last 4 Encounters:  04/14/17 33.20 kg/m  07/18/16 32.73 kg/m  04/17/16 38.78 kg/m  04/15/16 33.36 kg/m   Wt Readings from Last 4 Encounters:  04/14/17 212 lb (96.2 kg)  07/18/16 209 lb (94.8 kg)  04/17/16 212 lb (96.2 kg)  04/15/16 213 lb (96.6 kg)  Psych/Mental status: Alert, oriented x 3 (person, place, & time)       Eyes: PERLA Respiratory: No evidence of acute respiratory distress  Lumbar Spine Area Exam  Skin & Axial Inspection: No masses, redness, or swelling Alignment: Symmetrical Functional ROM: Unrestricted ROM      Stability: No instability detected Muscle Tone/Strength: Functionally intact. No obvious neuro-muscular anomalies detected. Sensory (Neurological): Unimpaired Palpation: No palpable anomalies       Provocative Tests: Lumbar Hyperextension and rotation test: Positive on the left for facet joint pain. Lumbar Lateral bending test: Positive ipsilateral radicular pain, on the left. Positive for left-sided foraminal stenosis. Patrick's Maneuver: evaluation deferred today                    Gait & Posture Assessment  Ambulation: Unassisted Gait: Relatively normal for age and body habitus Posture: WNL   Lower Extremity Exam    Side: Right lower extremity  Side: Left lower extremity  Skin & Extremity Inspection: Skin color, temperature, and hair growth are WNL. No peripheral  edema or cyanosis. No masses, redness, swelling, asymmetry, or associated skin lesions. No contractures.  Skin & Extremity Inspection: Skin color, temperature, and hair growth are WNL. No peripheral edema or cyanosis. No masses, redness, swelling, asymmetry, or associated  skin lesions. No contractures.  Functional ROM: Unrestricted ROM          Functional ROM: Unrestricted ROM          Muscle Tone/Strength: Functionally intact. No obvious neuro-muscular anomalies detected.  Muscle Tone/Strength: Functionally intact. No obvious neuro-muscular anomalies detected.  Sensory (Neurological): Unimpaired  Sensory (Neurological): Dermatomal pain pattern  Palpation: No palpable anomalies  Palpation: No palpable anomalies  Positive straight leg raise test on the left.  Procedure 76-year-old female who presents with left leg pain that is worsened over the last 2-3 months. Lumbar MRI shows moderate left and severe left lateral recess stenosis at L4/5 and moderate to severe bilateral foraminal stenosis with moderate bilateral recess stenosis at L5-S1. Patient's pain is predominantly left-sided. Physical exam shows positive straight leg raise test on the left. Patient has been participating in physical therapy and states that this has been helping. Risks and benefits of lumbar epidural steroid injection were discussed and patient agrees to proceed.   Plan: -Left L4/L5 epidural steroid injection. See procedure note. 

## 2017-04-14 NOTE — Patient Instructions (Signed)

## 2017-04-14 NOTE — Progress Notes (Signed)
Safety precautions to be maintained throughout the outpatient stay will include: orient to surroundings, keep bed in low position, maintain call bell within reach at all times, provide assistance with transfer out of bed and ambulation.  

## 2017-04-15 ENCOUNTER — Telehealth: Payer: Self-pay | Admitting: *Deleted

## 2017-04-15 NOTE — Telephone Encounter (Signed)
No problems post procedure. 

## 2017-04-29 ENCOUNTER — Ambulatory Visit
Payer: Medicare Other | Attending: Student in an Organized Health Care Education/Training Program | Admitting: Student in an Organized Health Care Education/Training Program

## 2017-04-29 ENCOUNTER — Encounter: Payer: Self-pay | Admitting: Student in an Organized Health Care Education/Training Program

## 2017-04-29 ENCOUNTER — Other Ambulatory Visit: Payer: Self-pay

## 2017-04-29 VITALS — BP 136/68 | HR 73 | Temp 98.3°F | Resp 16 | Ht 67.0 in | Wt 212.0 lb

## 2017-04-29 DIAGNOSIS — M5442 Lumbago with sciatica, left side: Secondary | ICD-10-CM | POA: Diagnosis not present

## 2017-04-29 DIAGNOSIS — Z885 Allergy status to narcotic agent status: Secondary | ICD-10-CM | POA: Insufficient documentation

## 2017-04-29 DIAGNOSIS — S0990XA Unspecified injury of head, initial encounter: Secondary | ICD-10-CM | POA: Insufficient documentation

## 2017-04-29 DIAGNOSIS — M65811 Other synovitis and tenosynovitis, right shoulder: Secondary | ICD-10-CM | POA: Diagnosis not present

## 2017-04-29 DIAGNOSIS — M5116 Intervertebral disc disorders with radiculopathy, lumbar region: Secondary | ICD-10-CM | POA: Diagnosis not present

## 2017-04-29 DIAGNOSIS — M75111 Incomplete rotator cuff tear or rupture of right shoulder, not specified as traumatic: Secondary | ICD-10-CM | POA: Diagnosis not present

## 2017-04-29 DIAGNOSIS — M5136 Other intervertebral disc degeneration, lumbar region: Secondary | ICD-10-CM | POA: Diagnosis not present

## 2017-04-29 DIAGNOSIS — E039 Hypothyroidism, unspecified: Secondary | ICD-10-CM | POA: Insufficient documentation

## 2017-04-29 DIAGNOSIS — M79605 Pain in left leg: Secondary | ICD-10-CM | POA: Insufficient documentation

## 2017-04-29 DIAGNOSIS — N859 Noninflammatory disorder of uterus, unspecified: Secondary | ICD-10-CM | POA: Diagnosis not present

## 2017-04-29 DIAGNOSIS — M48061 Spinal stenosis, lumbar region without neurogenic claudication: Secondary | ICD-10-CM | POA: Diagnosis not present

## 2017-04-29 DIAGNOSIS — N1330 Unspecified hydronephrosis: Secondary | ICD-10-CM | POA: Diagnosis not present

## 2017-04-29 DIAGNOSIS — Z79899 Other long term (current) drug therapy: Secondary | ICD-10-CM | POA: Diagnosis not present

## 2017-04-29 DIAGNOSIS — M5416 Radiculopathy, lumbar region: Secondary | ICD-10-CM | POA: Diagnosis not present

## 2017-04-29 DIAGNOSIS — Z96652 Presence of left artificial knee joint: Secondary | ICD-10-CM | POA: Insufficient documentation

## 2017-04-29 DIAGNOSIS — M51369 Other intervertebral disc degeneration, lumbar region without mention of lumbar back pain or lower extremity pain: Secondary | ICD-10-CM

## 2017-04-29 DIAGNOSIS — G4733 Obstructive sleep apnea (adult) (pediatric): Secondary | ICD-10-CM | POA: Diagnosis not present

## 2017-04-29 DIAGNOSIS — M19011 Primary osteoarthritis, right shoulder: Secondary | ICD-10-CM | POA: Insufficient documentation

## 2017-04-29 DIAGNOSIS — M9983 Other biomechanical lesions of lumbar region: Secondary | ICD-10-CM | POA: Diagnosis not present

## 2017-04-29 DIAGNOSIS — G8929 Other chronic pain: Secondary | ICD-10-CM

## 2017-04-29 DIAGNOSIS — M65812 Other synovitis and tenosynovitis, left shoulder: Secondary | ICD-10-CM | POA: Insufficient documentation

## 2017-04-29 DIAGNOSIS — I10 Essential (primary) hypertension: Secondary | ICD-10-CM | POA: Insufficient documentation

## 2017-04-29 NOTE — Progress Notes (Signed)
Patient's Name: Erika Crawford  MRN: [id number]  Referring Provider: Glendon Axe, MD  DOB: [birth year]  PCP: Glendon Axe, MD  DOS: 04/29/2017  Note by: [name], MD  Service setting: Ambulatory outpatient  Specialty: Interventional Pain Management  Location: [facility] (AMB) Pain Management Facility    Patient type: Established   Primary Reason(s) for Visit: Encounter for post-procedure evaluation of chronic illness with mild to moderate exacerbation CC: Leg Pain (left)  HPI  Erika Crawford is a 76 y.o. year old, female patient, who comes today for a post-procedure evaluation. She has Status post total knee replacement using cement, left; Rotator cuff tendinitis, right; Rotator cuff tendinitis, left; Primary osteoarthritis of right shoulder; Osteoarthritis; Obstructive sleep apnea on CPAP; Injury of tendon of long head of right biceps; Incomplete tear of right rotator cuff; Disorder of uterus; and Bilateral hydronephrosis on their problem list. Her primarily concern today is the Leg Pain (left)  Pain Assessment: Location: Left Leg(buttocks) Radiating:   Onset: More than a month ago Duration: Chronic pain Quality: (No pain today) Severity: 0-No pain/10 (self-reported pain score)  Note: Reported level is compatible with observation.                         When using our objective Pain Scale, levels between 6 and 10/10 are said to belong in an emergency room, as it progressively worsens from a 6/10, described as severely limiting, requiring emergency care not usually available at an outpatient pain management facility. At a 6/10 level, communication becomes difficult and requires great effort. Assistance to reach the emergency department may be required. Facial flushing and profuse sweating along with potentially dangerous increases in heart rate and blood pressure will be evident. Effect on ADL: no issues Timing: Intermittent Modifying factors: Shot in back and therapy  Erika Crawford comes in  today for post-procedure evaluation after the treatment done on 04/14/2017.  Further details on both, my assessment(s), as well as the proposed treatment plan, please see below.  Post-Procedure Assessment  04/14/2017 Procedure: Left L4/5 ESI Pre-procedure pain score:  0/10 Post-procedure pain score: 0/10         Influential Factors: BMI: 33.20 kg/m Intra-procedural challenges: None observed.         Assessment challenges: None detected.              Reported side-effects: None.        Post-procedural adverse reactions or complications: None reported         Sedation: Please see nurses note. When no sedatives are used, the analgesic levels obtained are directly associated to the effectiveness of the local anesthetics. However, when sedation is provided, the level of analgesia obtained during the initial 1 hour following the intervention, is believed to be the result of a combination of factors. These factors may include, but are not limited to: 1. The effectiveness of the local anesthetics used. 2. The effects of the analgesic(s) and/or anxiolytic(s) used. 3. The degree of discomfort experienced by the patient at the time of the procedure. 4. The patients ability and reliability in recalling and recording the events. 5. The presence and influence of possible secondary gains and/or psychosocial factors. Reported result: Relief experienced during the 1st hour after the procedure: 100 % (Ultra-Short Term Relief)            Interpretative annotation: Clinically appropriate result. Analgesia during this period is likely to be Local Anesthetic and/or IV Sedative (Analgesic/Anxiolytic) related.  Effects of local anesthetic: The analgesic effects attained during this period are directly associated to the localized infiltration of local anesthetics and therefore cary significant diagnostic value as to the etiological location, or anatomical origin, of the pain. Expected duration of relief is  directly dependent on the pharmacodynamics of the local anesthetic used. Long-acting (4-6 hours) anesthetics used.  Reported result: Relief during the next 4 to 6 hour after the procedure: 100 % (Short-Term Relief)            Interpretative annotation: Clinically appropriate result. Analgesia during this period is likely to be Local Anesthetic-related.          Long-term benefit: Defined as the period of time past the expected duration of local anesthetics (1 hour for short-acting and 4-6 hours for long-acting). With the possible exception of prolonged sympathetic blockade from the local anesthetics, benefits during this period are typically attributed to, or associated with, other factors such as analgesic sensory neuropraxia, antiinflammatory effects, or beneficial biochemical changes provided by agents other than the local anesthetics.  Reported result: Extended relief following procedure: 100 %(ongoing) (Long-Term Relief)            Interpretative annotation: Clinically appropriate result. Good relief. No permanent benefit expected. Inflammation plays a part in the etiology to the pain.          Current benefits: Defined as reported results that persistent at this point in time.   Analgesia: 100 % Erika Crawford reports that both, extremity and the axial pain improved with the treatment. Function: Back to baseline ROM: Back to baseline Interpretative annotation: Ongoing benefit. Therapeutic benefit observed. Effective therapeutic approach.          Interpretation: Results would suggest a successful diagnostic and therapeutic intervention.                  Plan:  Please see "Plan of Care" for details.        Laboratory Chemistry  Inflammation Markers (CRP: Acute Phase) (ESR: Chronic Phase) No results found for: CRP, ESRSEDRATE               Renal Function Markers Lab Results  Component Value Date   BUN 13 04/15/2016   CREATININE 0.64 04/15/2016   GFRAA >60 04/15/2016   GFRNONAA >60  04/15/2016                 Hepatic Function Markers Lab Results  Component Value Date   AST 16 01/19/2014   ALT 25 01/19/2014   ALBUMIN 3.6 01/19/2014   ALKPHOS 62 01/19/2014                 Electrolytes Lab Results  Component Value Date   NA 136 04/15/2016   K 3.3 (L) 04/15/2016   CL 101 04/15/2016   CALCIUM 9.5 04/15/2016                 Neuropathy Markers No results found for: [id number]               Bone Pathology Markers Lab Results  Component Value Date   ALKPHOS 62 01/19/2014   CALCIUM 9.5 04/15/2016                 Rheumatology Markers No results found for: LABURIC              Coagulation Parameters Lab Results  Component Value Date   INR 1.06 04/15/2016   LABPROT 13.8 04/15/2016   APTT 30 04/15/2016  PLT 264 04/15/2016                 Cardiovascular Markers Lab Results  Component Value Date   TROPONINI <0.03 11/01/2014   HGB 12.3 04/15/2016   HCT 37.6 04/15/2016                 CA Markers No results found for: CEA, CA125, LABCA2               Note: Lab results reviewed.  Recent Diagnostic Imaging Results  DG C-Arm 1-60 Min-No Report Fluoroscopy was utilized by the requesting physician.  No radiographic  interpretation.   Complexity Note: Imaging results reviewed. Results shared with Ms. Westfall, using Layman's terms.                         Meds   Current Outpatient Medications:  .  acetaminophen (TYLENOL) 500 MG tablet, Take 500 mg by mouth every 6 (six) hours as needed., Disp: , Rfl:  .  alendronate (FOSAMAX) 70 MG tablet, Take 70 mg by mouth once a week. Take with a full glass of water on an empty stomach., Disp: , Rfl:  .  amLODipine-valsartan (EXFORGE) 5-320 MG tablet, Take 1 tablet by mouth daily., Disp: , Rfl:  .  Calcium Carbonate-Vitamin D (CALTRATE 600+D) 600-400 MG-UNIT tablet, Take 1 tablet by mouth 2 (two) times daily., Disp: , Rfl:  .  cyclobenzaprine (FLEXERIL) 5 MG tablet, Take 1-2 tablets by mouth 3 (three) times  daily., Disp: , Rfl:  .  diclofenac (VOLTAREN) 50 MG EC tablet, Take 50 mg by mouth 2 (two) times daily., Disp: , Rfl:  .  diclofenac sodium (VOLTAREN) 1 % GEL, Apply topically 3 (three) times daily., Disp: , Rfl:  .  levothyroxine (SYNTHROID, LEVOTHROID) 100 MCG tablet, Take 100 mcg by mouth daily before breakfast., Disp: , Rfl:  .  mometasone (NASONEX) 50 MCG/ACT nasal spray, Place 2 sprays into the nose daily., Disp: , Rfl:  .  omeprazole (PRILOSEC) 20 MG capsule, Take 20 mg by mouth daily., Disp: , Rfl:  .  senna-docusate (SENOKOT-S) 8.6-50 MG tablet, Take 1 tablet by mouth 3 times/day as needed-between meals & bedtime., Disp: , Rfl:  .  tapentadol (NUCYNTA) 50 MG tablet, Take 1 tablet (50 mg total) by mouth every 4 (four) hours as needed for moderate pain., Disp: 30 tablet, Rfl: 0 .  valsartan (DIOVAN) 320 MG tablet, Take 320 mg by mouth daily., Disp: , Rfl:  .  VITAMIN D, ERGOCALCIFEROL, PO, Take by mouth 2 (two) times daily., Disp: , Rfl:   ROS  Constitutional: Denies any fever or chills Gastrointestinal: No reported hemesis, hematochezia, vomiting, or acute GI distress Musculoskeletal: Denies any acute onset joint swelling, redness, loss of ROM, or weakness Neurological: No reported episodes of acute onset apraxia, aphasia, dysarthria, agnosia, amnesia, paralysis, loss of coordination, or loss of consciousness  Allergies  Ms. Puder is allergic to celebrex [celecoxib]; codeine; etodolac; hydrochlorothiazide; oxycodone; pravastatin; relafen [nabumetone]; and tramadol.  PFSH  Drug: Ms. Wellons  reports that she does not use drugs. Alcohol:  reports that she drinks alcohol. Tobacco:  reports that  has never smoked. she has never used smokeless tobacco. Medical:  has a past medical history of Arthritis, Complication of anesthesia, Hypertension, Hypothyroidism, Sleep apnea, and Thyroid disease. Surgical: Ms. Backs  has a past surgical history that includes Abdominal hysterectomy; Back  surgery; Joint replacement; TOTAL KNEE ARTHROPLASTY (Left, 03/21/2016); and COLONOSCOPY WITH PROPOFOL (  N/A, 09/21/2015). Family: family history is not on file.  Constitutional Exam  General appearance: Well nourished, well developed, and well hydrated. In no apparent acute distress Vitals:   04/29/17 1137  BP: 136/68  Pulse: 73  Resp: 16  Temp: 98.3 F (36.8 C)  TempSrc: Oral  SpO2: 100%  Weight: 212 lb (96.2 kg)  Height: _0  (1.702 m)   BMI Assessment: Estimated body mass index is 33.2 kg/m as calculated from the following:   Height as of this encounter: _1  (1.702 m).   Weight as of this encounter: 212 lb (96.2 kg).  BMI interpretation table: BMI level Category Range association with higher incidence of chronic pain  <18 kg/m2 Underweight   18.5-24.9 kg/m2 Ideal body weight   25-29.9 kg/m2 Overweight Increased incidence by 20%  30-34.9 kg/m2 Obese (Class I) Increased incidence by 68%  35-39.9 kg/m2 Severe obesity (Class II) Increased incidence by 136%  >40 kg/m2 Extreme obesity (Class III) Increased incidence by 254%   BMI Readings from Last 4 Encounters:  04/29/17 33.20 kg/m  04/14/17 33.20 kg/m  07/18/16 32.73 kg/m  04/17/16 38.78 kg/m   Wt Readings from Last 4 Encounters:  04/29/17 212 lb (96.2 kg)  04/14/17 212 lb (96.2 kg)  07/18/16 209 lb (94.8 kg)  04/17/16 212 lb (96.2 kg)  Psych/Mental status: Alert, oriented x 3 (person, place, & time)       Eyes: PERLA Respiratory: No evidence of acute respiratory distress  Cervical Spine Area Exam  Skin & Axial Inspection: No masses, redness, edema, swelling, or associated skin lesions Alignment: Symmetrical Functional ROM: Unrestricted ROM      Stability: No instability detected Muscle Tone/Strength: Functionally intact. No obvious neuro-muscular anomalies detected. Sensory (Neurological): Unimpaired Palpation: No palpable anomalies              Upper Extremity (UE) Exam    Side: Right upper extremity  Side:  Left upper extremity  Skin & Extremity Inspection: Skin color, temperature, and hair growth are WNL. No peripheral edema or cyanosis. No masses, redness, swelling, asymmetry, or associated skin lesions. No contractures.  Skin & Extremity Inspection: Skin color, temperature, and hair growth are WNL. No peripheral edema or cyanosis. No masses, redness, swelling, asymmetry, or associated skin lesions. No contractures.  Functional ROM: Unrestricted ROM          Functional ROM: Unrestricted ROM          Muscle Tone/Strength: Functionally intact. No obvious neuro-muscular anomalies detected.  Muscle Tone/Strength: Functionally intact. No obvious neuro-muscular anomalies detected.  Sensory (Neurological): Unimpaired          Sensory (Neurological): Unimpaired          Palpation: No palpable anomalies              Palpation: No palpable anomalies              Specialized Test(s): Deferred         Specialized Test(s): Deferred          Thoracic Spine Area Exam  Skin & Axial Inspection: No masses, redness, or swelling Alignment: Symmetrical Functional ROM: Unrestricted ROM Stability: No instability detected Muscle Tone/Strength: Functionally intact. No obvious neuro-muscular anomalies detected. Sensory (Neurological): Unimpaired Muscle strength & Tone: No palpable anomalies  Lumbar Spine Area Exam  Skin & Axial Inspection: No masses, redness, or swelling Alignment: Symmetrical Functional ROM: Unrestricted ROM      Stability: No instability detected Muscle Tone/Strength: Functionally intact. No obvious neuro-muscular anomalies detected.  Sensory (Neurological): Unimpaired Palpation: No palpable anomalies       Provocative Tests: Lumbar Hyperextension and rotation test: Positive on the left for facet joint pain.  Significantly improved after treatment Lumbar Lateral bending test: Positive ipsilateral radicular pain, on the left. Positive for left-sided foraminal stenosis.  Significantly improved after  treatment Patrick's Maneuver: evaluation deferred today         Gait & Posture Assessment  Ambulation: Unassisted Gait: Relatively normal for age and body habitus Posture: WNL   Lower Extremity Exam    Side: Right lower extremity  Side: Left lower extremity  Skin & Extremity Inspection: Skin color, temperature, and hair growth are WNL. No peripheral edema or cyanosis. No masses, redness, swelling, asymmetry, or associated skin lesions. No contractures.  Skin & Extremity Inspection: Skin color, temperature, and hair growth are WNL. No peripheral edema or cyanosis. No masses, redness, swelling, asymmetry, or associated skin lesions. No contractures.  Functional ROM: Unrestricted ROM          Functional ROM: Unrestricted ROM          Muscle Tone/Strength: Functionally intact. No obvious neuro-muscular anomalies detected.  Muscle Tone/Strength: Functionally intact. No obvious neuro-muscular anomalies detected.  Sensory (Neurological): Unimpaired  Sensory (Neurological): Improved after treatment.  Paresthesias and radicular symptoms.  Palpation: No palpable anomalies  Palpation: No palpable anomalies   Assessment  Primary Diagnosis & Pertinent Problem List: The primary encounter diagnosis was Lumbar radiculopathy. Diagnoses of Lumbar foraminal stenosis, Lumbar degenerative disc disease, and Chronic left-sided low back pain with left-sided sciatica were also pertinent to this visit.  Status Diagnosis  Controlled Controlled Controlled 1. Lumbar radiculopathy   2. Lumbar foraminal stenosis   3. Lumbar degenerative disc disease   4. Chronic left-sided low back pain with left-sided sciatica      75 year old female who presented with left leg pain progressively worsening over 2-3 months with lumbar MRI showing left lumbar radiculopathy at L4-L5 and L5-S1 secondary to foraminal stenosis and recess stenosis left greater than right.  He follows up status post left L4-L5 ESI with significant  improvement in her left radicular symptoms.  Patient endorses significant pain relief, improvement in functional status, greater ease of performing activities of daily living.  She is participating with physical therapy and is making good progress with them.  She has 3 or 4 more sessions with them.  She states that she is scheduled for shoulder surgery in the future.  Given that the patient has done so well after our first epidural steroid injection, I will see her on an as-needed basis should her symptoms recur.  If they do we can consider repeating ESI at that time.   Plan: -Follow-up as needed if symptoms return.   Provider-requested follow-up: Return if symptoms worsen or fail to improve.  No future appointments.  Primary Care Physician: Glendon Axe, MD Location: [facility] Note by: [name], M.D Date: 04/29/2017; Time: 12:02 PM  There are no Patient Instructions on file for this visit.

## 2017-04-29 NOTE — Progress Notes (Signed)
Safety precautions to be maintained throughout the outpatient stay will include: orient to surroundings, keep bed in low position, maintain call bell within reach at all times, provide assistance with transfer out of bed and ambulation.  

## 2017-05-12 ENCOUNTER — Telehealth: Payer: Self-pay

## 2017-05-12 NOTE — Telephone Encounter (Signed)
Has prn order for esi. Spoke with Dr. Holley Raring, ok to schedule for lesi if needed. Attempted to call patient, message left.

## 2017-05-13 NOTE — Telephone Encounter (Signed)
Spoke with patient, states she already called this a.m. And scheduled the appointment.

## 2017-05-22 ENCOUNTER — Other Ambulatory Visit: Payer: Self-pay

## 2017-05-22 ENCOUNTER — Encounter: Payer: Self-pay | Admitting: Student in an Organized Health Care Education/Training Program

## 2017-05-22 ENCOUNTER — Ambulatory Visit
Payer: Medicare Other | Attending: Student in an Organized Health Care Education/Training Program | Admitting: Student in an Organized Health Care Education/Training Program

## 2017-05-22 VITALS — BP 142/71 | HR 72 | Temp 98.3°F | Resp 18 | Ht 67.0 in | Wt 210.0 lb

## 2017-05-22 DIAGNOSIS — M546 Pain in thoracic spine: Secondary | ICD-10-CM | POA: Insufficient documentation

## 2017-05-22 DIAGNOSIS — N859 Noninflammatory disorder of uterus, unspecified: Secondary | ICD-10-CM | POA: Insufficient documentation

## 2017-05-22 DIAGNOSIS — S0990XA Unspecified injury of head, initial encounter: Secondary | ICD-10-CM | POA: Diagnosis not present

## 2017-05-22 DIAGNOSIS — M5416 Radiculopathy, lumbar region: Secondary | ICD-10-CM

## 2017-05-22 DIAGNOSIS — Z96652 Presence of left artificial knee joint: Secondary | ICD-10-CM | POA: Diagnosis not present

## 2017-05-22 DIAGNOSIS — Z79899 Other long term (current) drug therapy: Secondary | ICD-10-CM | POA: Insufficient documentation

## 2017-05-22 DIAGNOSIS — M9983 Other biomechanical lesions of lumbar region: Secondary | ICD-10-CM | POA: Diagnosis not present

## 2017-05-22 DIAGNOSIS — M75101 Unspecified rotator cuff tear or rupture of right shoulder, not specified as traumatic: Secondary | ICD-10-CM | POA: Diagnosis not present

## 2017-05-22 DIAGNOSIS — Z885 Allergy status to narcotic agent status: Secondary | ICD-10-CM | POA: Insufficient documentation

## 2017-05-22 DIAGNOSIS — M5136 Other intervertebral disc degeneration, lumbar region: Secondary | ICD-10-CM

## 2017-05-22 DIAGNOSIS — M5116 Intervertebral disc disorders with radiculopathy, lumbar region: Secondary | ICD-10-CM | POA: Insufficient documentation

## 2017-05-22 DIAGNOSIS — N1330 Unspecified hydronephrosis: Secondary | ICD-10-CM | POA: Diagnosis not present

## 2017-05-22 DIAGNOSIS — M48061 Spinal stenosis, lumbar region without neurogenic claudication: Secondary | ICD-10-CM | POA: Insufficient documentation

## 2017-05-22 DIAGNOSIS — G4733 Obstructive sleep apnea (adult) (pediatric): Secondary | ICD-10-CM | POA: Insufficient documentation

## 2017-05-22 NOTE — Assessment & Plan Note (Signed)
Return of radicular symptoms.  States that right side is worse than left.  Still endorsing moderate pain relief of her left leg symptoms.  We will plan for midline repeat L4-L5 epidural steroid injection.

## 2017-05-22 NOTE — Progress Notes (Signed)
Safety precautions to be maintained throughout the outpatient stay will include: orient to surroundings, keep bed in low position, maintain call bell within reach at all times, provide assistance with transfer out of bed and ambulation.  

## 2017-05-22 NOTE — Progress Notes (Signed)
Patient's Name: Erika Crawford  MRN: [id number]  Referring Provider: Glendon Axe, MD  DOB: 01-19-1941  PCP: Glendon Axe, MD  DOS: 05/22/2017  Note by: [name], MD  Service setting: Ambulatory outpatient  Specialty: Interventional Pain Management  Location: [facility] (AMB) Pain Management Facility    Patient type: Established   Primary Reason(s) for Visit: Encounter for prescription drug management. (Level of risk: moderate)  CC: Back Pain (low and right, some mid back pain and waist line)  HPI  Erika Crawford is a 76 y.o. year old, female patient, who comes today for a medication management evaluation. She has Status post total knee replacement using cement, left; Rotator cuff tendinitis, right; Rotator cuff tendinitis, left; Primary osteoarthritis of right shoulder; Osteoarthritis; Obstructive sleep apnea on CPAP; Injury of tendon of long head of right biceps; Incomplete tear of right rotator cuff; Disorder of uterus; Bilateral hydronephrosis; and Lumbar radiculopathy on their problem list. Her primarily concern today is the Back Pain (low and right, some mid back pain and waist line)  Pain Assessment: Location: Lower, Mid, Right Back Radiating:   Onset: More than a month ago Duration: Chronic pain Quality: Sore, Constant Severity: 6 /10 (self-reported pain score)  Note: Reported level is inconsistent with clinical observations. Clinically the patient looks like a 2/10 A 2/10 is viewed as "Mild to Moderate" and described as noticeable and distracting. Impossible to hide from other people. More frequent flare-ups. Still possible to adapt and function close to normal. It can be very annoying and may have occasional stronger flare-ups. With discipline, patients may get used to it and adapt.       When using our objective Pain Scale, levels between 6 and 10/10 are said to belong in an emergency room, as it progressively worsens from a 6/10, described as severely limiting, requiring emergency care  not usually available at an outpatient pain management facility. At a 6/10 level, communication becomes difficult and requires great effort. Assistance to reach the emergency department may be required. Facial flushing and profuse sweating along with potentially dangerous increases in heart rate and blood pressure will be evident. Effect on ADL:   Timing: Constant Modifying factors: procedures,OTC ointment  Erika Crawford was last scheduled for an appointment on 04/29/2017 for medication management. During today's appointment we reviewed Erika Crawford's chronic pain status, as well as her outpatient medication regimen.  The patient  reports that she does not use drugs. Her body mass index is 32.89 kg/m.  Further details on both, my assessment(s), as well as the proposed treatment plan, please see below.  Laboratory Chemistry  Inflammation Markers (CRP: Acute Phase) (ESR: Chronic Phase) No results found for: CRP, ESRSEDRATE, LATICACIDVEN               Rheumatology Markers No results found for: RF, ANA, [name], [facility]              Renal Function Markers Lab Results  Component Value Date   BUN 13 04/15/2016   CREATININE 0.64 04/15/2016   GFRAA >60 04/15/2016   GFRNONAA >60 04/15/2016                 Hepatic Function Markers Lab Results  Component Value Date   AST 16 01/19/2014   ALT 25 01/19/2014   ALBUMIN 3.6 01/19/2014   ALKPHOS 62 01/19/2014                 Electrolytes Lab Results  Component Value Date   NA 136  04/15/2016   K 3.3 (L) 04/15/2016   CL 101 04/15/2016   CALCIUM 9.5 04/15/2016                 Neuropathy Markers No results found for: VITAMINB12, FOLATE, HGBA1C, HIV               Bone Pathology Markers No results found for: VD25OH, Gertie Baron, [id number], [id number], 25OHVITD1, 25OHVITD2, 25OHVITD3, TESTOFREE, TESTOSTERONE               Coagulation Parameters Lab Results  Component Value Date   INR 1.06 04/15/2016   LABPROT 13.8  04/15/2016   APTT 30 04/15/2016   PLT 264 04/15/2016                 Cardiovascular Markers Lab Results  Component Value Date   TROPONINI <0.03 11/01/2014   HGB 12.3 04/15/2016   HCT 37.6 04/15/2016                 CA Markers No results found for: CEA, CA125, LABCA2               Note: Lab results reviewed.   Meds   Current Outpatient Medications:  .  acetaminophen (TYLENOL) 500 MG tablet, Take 500 mg by mouth every 6 (six) hours as needed., Disp: , Rfl:  .  alendronate (FOSAMAX) 70 MG tablet, Take 70 mg by mouth once a week. Take with a full glass of water on an empty stomach., Disp: , Rfl:  .  amLODipine-valsartan (EXFORGE) 5-320 MG tablet, Take 1 tablet by mouth daily., Disp: , Rfl:  .  Calcium Carbonate-Vitamin D (CALTRATE 600+D) 600-400 MG-UNIT tablet, Take 1 tablet by mouth 2 (two) times daily., Disp: , Rfl:  .  cyclobenzaprine (FLEXERIL) 5 MG tablet, Take 1-2 tablets by mouth 3 (three) times daily., Disp: , Rfl:  .  diclofenac (VOLTAREN) 50 MG EC tablet, Take 50 mg by mouth 2 (two) times daily., Disp: , Rfl:  .  diclofenac sodium (VOLTAREN) 1 % GEL, Apply topically 3 (three) times daily., Disp: , Rfl:  .  levothyroxine (SYNTHROID, LEVOTHROID) 100 MCG tablet, Take 100 mcg by mouth daily before breakfast., Disp: , Rfl:  .  mometasone (NASONEX) 50 MCG/ACT nasal spray, Place 2 sprays into the nose daily., Disp: , Rfl:  .  omeprazole (PRILOSEC) 20 MG capsule, Take 20 mg by mouth daily., Disp: , Rfl:  .  senna-docusate (SENOKOT-S) 8.6-50 MG tablet, Take 1 tablet by mouth 3 times/day as needed-between meals & bedtime., Disp: , Rfl:  .  tapentadol (NUCYNTA) 50 MG tablet, Take 1 tablet (50 mg total) by mouth every 4 (four) hours as needed for moderate pain., Disp: 30 tablet, Rfl: 0 .  valsartan (DIOVAN) 320 MG tablet, Take 320 mg by mouth daily., Disp: , Rfl:  .  VITAMIN D, ERGOCALCIFEROL, PO, Take by mouth 2 (two) times daily., Disp: , Rfl:   ROS  Constitutional: Denies any fever  or chills Gastrointestinal: No reported hemesis, hematochezia, vomiting, or acute GI distress Musculoskeletal: Denies any acute onset joint swelling, redness, loss of ROM, or weakness Neurological: No reported episodes of acute onset apraxia, aphasia, dysarthria, agnosia, amnesia, paralysis, loss of coordination, or loss of consciousness  Allergies  Ms. Erbes is allergic to celebrex [celecoxib]; codeine; etodolac; hydrochlorothiazide; oxycodone; pravastatin; relafen [nabumetone]; and tramadol.  PFSH  Drug: Ms. Mcmasters  reports that she does not use drugs. Alcohol:  reports that she drinks alcohol. Tobacco:  reports that  has never  smoked. she has never used smokeless tobacco. Medical:  has a past medical history of Arthritis, Complication of anesthesia, Hypertension, Hypothyroidism, Sleep apnea, and Thyroid disease. Surgical: Ms. Utecht  has a past surgical history that includes Abdominal hysterectomy; Colonoscopy with propofol (N/A, 09/21/2015); Back surgery; Total knee arthroplasty (Left, 03/21/2016); and Joint replacement. Family: family history is not on file.  Constitutional Exam  General appearance: Well nourished, well developed, and well hydrated. In no apparent acute distress Vitals:   05/22/17 1249  BP: (!) 142/71  Pulse: 72  Resp: 18  Temp: 98.3 F (36.8 C)  TempSrc: Oral  SpO2: 100%  Weight: 210 lb (95.3 kg)  Height: _0  (1.702 m)   BMI Assessment: Estimated body mass index is 32.89 kg/m as calculated from the following:   Height as of this encounter: _1  (1.702 m).   Weight as of this encounter: 210 lb (95.3 kg).  BMI interpretation table: BMI level Category Range association with higher incidence of chronic pain  <18 kg/m2 Underweight   18.5-24.9 kg/m2 Ideal body weight   25-29.9 kg/m2 Overweight Increased incidence by 20%  30-34.9 kg/m2 Obese (Class I) Increased incidence by 68%  35-39.9 kg/m2 Severe obesity (Class II) Increased incidence by 136%  >40 kg/m2  Extreme obesity (Class III) Increased incidence by 254%   BMI Readings from Last 4 Encounters:  05/22/17 32.89 kg/m  04/29/17 33.20 kg/m  04/14/17 33.20 kg/m  07/18/16 32.73 kg/m   Wt Readings from Last 4 Encounters:  05/22/17 210 lb (95.3 kg)  04/29/17 212 lb (96.2 kg)  04/14/17 212 lb (96.2 kg)  07/18/16 209 lb (94.8 kg)  Psych/Mental status: Alert, oriented x 3 (person, place, & time)       Eyes: PERLA Respiratory: No evidence of acute respiratory distress  Cervical Spine Area Exam  Skin & Axial Inspection: No masses, redness, edema, swelling, or associated skin lesions Alignment: Symmetrical Functional ROM: Unrestricted ROM      Stability: No instability detected Muscle Tone/Strength: Functionally intact. No obvious neuro-muscular anomalies detected. Sensory (Neurological): Unimpaired Palpation: No palpable anomalies              Upper Extremity (UE) Exam    Side: Right upper extremity  Side: Left upper extremity  Skin & Extremity Inspection: Skin color, temperature, and hair growth are WNL. No peripheral edema or cyanosis. No masses, redness, swelling, asymmetry, or associated skin lesions. No contractures.  Skin & Extremity Inspection: Skin color, temperature, and hair growth are WNL. No peripheral edema or cyanosis. No masses, redness, swelling, asymmetry, or associated skin lesions. No contractures.  Functional ROM: Unrestricted ROM          Functional ROM: Unrestricted ROM          Muscle Tone/Strength: Functionally intact. No obvious neuro-muscular anomalies detected.  Muscle Tone/Strength: Functionally intact. No obvious neuro-muscular anomalies detected.  Sensory (Neurological): Unimpaired          Sensory (Neurological): Unimpaired          Palpation: No palpable anomalies              Palpation: No palpable anomalies              Specialized Test(s): Deferred         Specialized Test(s): Deferred          Thoracic Spine Area Exam  Skin & Axial Inspection: No  masses, redness, or swelling Alignment: Symmetrical Functional ROM: Unrestricted ROM Stability: No instability detected Muscle Tone/Strength: Functionally intact.  No obvious neuro-muscular anomalies detected. Sensory (Neurological): Unimpaired Muscle strength & Tone: No palpable anomalies   Lumbar Spine Area Exam  Skin & Axial Inspection: No masses, redness, or swelling Alignment: Symmetrical Functional ROM: Unrestricted ROM      Stability: No instability detected Muscle Tone/Strength: Functionally intact. No obvious neuro-muscular anomalies detected. Sensory (Neurological): Unimpaired Palpation: No palpable anomalies       Provocative Tests: Lumbar Hyperextension and rotation test: Positive on the left and right for facet joint pain. Lumbar Lateral bending test: Positive ipsilateral radicular pain, on the left. Positive for left-sided foraminal stenosis.  Pain on the right as well. Patrick's Maneuver: evaluation deferred today                    Gait & Posture Assessment  Ambulation: Unassisted Gait: Relatively normal for age and body habitus Posture: WNL   Lower Extremity Exam    Side: Right lower extremity  Side: Left lower extremity  Skin & Extremity Inspection: Skin color, temperature, and hair growth are WNL. No peripheral edema or cyanosis. No masses, redness, swelling, asymmetry, or associated skin lesions. No contractures.  Skin & Extremity Inspection: Skin color, temperature, and hair growth are WNL. No peripheral edema or cyanosis. No masses, redness, swelling, asymmetry, or associated skin lesions. No contractures.  Functional ROM: Unrestricted ROM          Functional ROM: Unrestricted ROM          Muscle Tone/Strength: Functionally intact. No obvious neuro-muscular anomalies detected.  Muscle Tone/Strength: Functionally intact. No obvious neuro-muscular anomalies detected.  Sensory (Neurological): Unimpaired  Sensory (Neurological): Dermatomal pain pattern   Palpation: No palpable anomalies  Palpation: No palpable anomalies    Assessment  Primary Diagnosis & Pertinent Problem List: The primary encounter diagnosis was Lumbar radiculopathy. Diagnoses of Lumbar foraminal stenosis and Lumbar degenerative disc disease were also pertinent to this visit.  Status Diagnosis  Having a Flare-up Stable Stable 1. Lumbar radiculopathy   2. Lumbar foraminal stenosis   3. Lumbar degenerative disc disease     Problems updated and reviewed during this visit: Problem  Lumbar Radiculopathy   Plan of Care   76 year old female who presents with left leg pain which had improved significantly after the patient's left L4-L5 epidural steroid injection on 04/14/2017.  Patient presents today endorsing slow return of her radicular left leg pain and new onset right leg pain.  She states that she is still continuing to experience pain relief on the left side from her left L4/5 ESI done at the end of October.  She returns requesting repeat injection.  I think this is reasonable.  We will target midline L4/5 to try and help some of her right-sided symptoms as well.  In regards to her bilateral shoulder osteoarthritis, patient is status post bilateral subacromial shoulder joint injections with steroid on May 19, 2017.  She finds these shoulder injections beneficial.  Of note, lumbar MRI shows moderate left and severe left lateral recess stenosis at L4/5 and moderate to severe bilateral foraminal stenosis with moderate bilateral recess stenosis at L5-S1.  We will plan for repeat L4/5 ESI #2.  Plan: -L4/5, midline ESI #2 for lumbar radiculopathy.  Lab-work, procedure(s), and/or referral(s): Orders Placed This Encounter  Procedures  . Lumbar Epidural Injection   Provider-requested follow-up: Return in about 6 days (around 05/28/2017) for Procedure.  Future Appointments  Date Time Provider [place]  05/28/2017  9:45 AM [name], MD [facility] None     Primary  Care Physician: Glendon Axe, MD Location: [facility] Note by: [name], M.D Date: 05/22/2017; Time: 1:13 PM  Patient Instructions   GENERAL RISKS AND COMPLICATIONS  What are the risk, side effects and possible complications? Generally speaking, most procedures are safe.  However, with any procedure there are risks, side effects, and the possibility of complications.  The risks and complications are dependent upon the sites that are lesioned, or the type of nerve block to be performed.  The closer the procedure is to the spine, the more serious the risks are.  Great care is taken when placing the radio frequency needles, block needles or lesioning probes, but sometimes complications can occur. 1. Infection: Any time there is an injection through the skin, there is a risk of infection.  This is why sterile conditions are used for these blocks.  There are four possible types of infection. 1. Localized skin infection. 2. Central Nervous System Infection-This can be in the form of Meningitis, which can be deadly. 3. Epidural Infections-This can be in the form of an epidural abscess, which can cause pressure inside of the spine, causing compression of the spinal cord with subsequent paralysis. This would require an emergency surgery to decompress, and there are no guarantees that the patient would recover from the paralysis. 4. Discitis-This is an infection of the intervertebral discs.  It occurs in about 1% of discography procedures.  It is difficult to treat and it may lead to surgery.        2. Pain: the needles have to go through skin and soft tissues, will cause soreness.       3. Damage to internal structures:  The nerves to be lesioned may be near blood vessels or    other nerves which can be potentially damaged.       4. Bleeding: Bleeding is more common if the patient is taking blood thinners such as  aspirin, Coumadin, Ticiid, Plavix, etc., or  if he/she have some genetic predisposition  such as hemophilia. Bleeding into the spinal canal can cause compression of the spinal  cord with subsequent paralysis.  This would require an emergency surgery to  decompress and there are no guarantees that the patient would recover from the  paralysis.       5. Pneumothorax:  Puncturing of a lung is a possibility, every time a needle is introduced in  the area of the chest or upper back.  Pneumothorax refers to free air around the  collapsed lung(s), inside of the thoracic cavity (chest cavity).  Another two possible  complications related to a similar event would include: Hemothorax and Chylothorax.   These are variations of the Pneumothorax, where instead of air around the collapsed  lung(s), you may have blood or chyle, respectively.       6. Spinal headaches: They may occur with any procedures in the area of the spine.       7. Persistent CSF (Cerebro-Spinal Fluid) leakage: This is a rare problem, but may occur  with prolonged intrathecal or epidural catheters either due to the formation of a fistulous  track or a dural tear.       8. Nerve damage: By working so close to the spinal cord, there is always a possibility of  nerve damage, which could be as serious as a permanent spinal cord injury with  paralysis.       9. Death:  Although rare, severe deadly allergic reactions known as "Anaphylactic  reaction" can occur to any of the medications used.      10. Worsening of the symptoms:  We can always make thing worse.  What are the chances of something like this happening? Chances of any of this occuring are extremely low.  By statistics, you have more of a chance of getting killed in a motor vehicle accident: while driving to the hospital than any of the above occurring .  Nevertheless, you should be aware that they are possibilities.  In general, it is similar to taking a shower.  Everybody knows that you can slip, hit your head and get killed.  Does that  mean that you should not shower again?  Nevertheless always keep in mind that statistics do not mean anything if you happen to be on the wrong side of them.  Even if a procedure has a 1 (one) in a 1,000,000 (million) chance of going wrong, it you happen to be that one..Also, keep in mind that by statistics, you have more of a chance of having something go wrong when taking medications.  Who should not have this procedure? If you are on a blood thinning medication (e.g. Coumadin, Plavix, see list of "Blood Thinners"), or if you have an active infection going on, you should not have the procedure.  If you are taking any blood thinners, please inform your physician.  How should I prepare for this procedure?  Do not eat or drink anything at least six hours prior to the procedure.  Bring a driver with you .  It cannot be a taxi.  Come accompanied by an adult that can drive you back, and that is strong enough to help you if your legs get weak or numb from the local anesthetic.  Take all of your medicines the morning of the procedure with just enough water to swallow them.  If you have diabetes, make sure that you are scheduled to have your procedure done first thing in the morning, whenever possible.  If you have diabetes, take only half of your insulin dose and notify our nurse that you have done so as soon as you arrive at the clinic.  If you are diabetic, but only take blood sugar pills (oral hypoglycemic), then do not take them on the morning of your procedure.  You may take them after you have had the procedure.  Do not take aspirin or any aspirin-containing medications, at least eleven (11) days prior to the procedure.  They may prolong bleeding.  Wear loose fitting clothing that may be easy to take off and that you would not mind if it got stained with Betadine or blood.  Do not wear any jewelry or perfume  Remove any nail coloring.  It will interfere with some of our monitoring  equipment.  NOTE: Remember that this is not meant to be interpreted as a complete list of all possible complications.  Unforeseen problems may occur.  BLOOD THINNERS The following drugs contain aspirin or other products, which can cause increased bleeding during surgery and should not be taken for 2 weeks prior to and 1 week after surgery.  If you should need take something for relief of minor pain, you may take acetaminophen which is found in Tylenol,m Datril, Anacin-3 and Panadol. It is not blood thinner. The products listed below are.  Do not take any of the products listed below in addition to any listed on your instruction sheet.  A.P.C or A.P.C with Codeine Codeine Phosphate Capsules #3  Ibuprofen Ridaura  ABC compound Congesprin Imuran rimadil  Advil Cope Indocin Robaxisal  Alka-Seltzer Effervescent Pain Reliever and Antacid Coricidin or Coricidin-D  Indomethacin Rufen  Alka-Seltzer plus Cold Medicine Cosprin Ketoprofen S-A-C Tablets  Anacin Analgesic Tablets or Capsules Coumadin Korlgesic Salflex  Anacin Extra Strength Analgesic tablets or capsules CP-2 Tablets Lanoril Salicylate  Anaprox Cuprimine Capsules Levenox Salocol  Anexsia-D Dalteparin Magan Salsalate  Anodynos Darvon compound Magnesium Salicylate Sine-off  Ansaid Dasin Capsules Magsal Sodium Salicylate  Anturane Depen Capsules Marnal Soma  APF Arthritis pain formula Dewitt's Pills Measurin Stanback  Argesic Dia-Gesic Meclofenamic Sulfinpyrazone  Arthritis Bayer Timed Release Aspirin Diclofenac Meclomen Sulindac  Arthritis pain formula Anacin Dicumarol Medipren Supac  Analgesic (Safety coated) Arthralgen Diffunasal Mefanamic Suprofen  Arthritis Strength Bufferin Dihydrocodeine Mepro Compound Suprol  Arthropan liquid Dopirydamole Methcarbomol with Aspirin Synalgos  ASA tablets/Enseals Disalcid Micrainin Tagament  Ascriptin Doan's Midol Talwin  Ascriptin A/D Dolene Mobidin Tanderil  Ascriptin Extra Strength Dolobid  Moblgesic Ticlid  Ascriptin with Codeine Doloprin or Doloprin with Codeine Momentum Tolectin  Asperbuf Duoprin Mono-gesic Trendar  Aspergum Duradyne Motrin or Motrin IB Triminicin  Aspirin plain, buffered or enteric coated Durasal Myochrisine Trigesic  Aspirin Suppositories Easprin Nalfon Trillsate  Aspirin with Codeine Ecotrin Regular or Extra Strength Naprosyn Uracel  Atromid-S Efficin Naproxen Ursinus  Auranofin Capsules Elmiron Neocylate Vanquish  Axotal Emagrin Norgesic Verin  Azathioprine Empirin or Empirin with Codeine Normiflo Vitamin E  Azolid Emprazil Nuprin Voltaren  Bayer Aspirin plain, buffered or children's or timed BC Tablets or powders Encaprin Orgaran Warfarin Sodium  Buff-a-Comp Enoxaparin Orudis Zorpin  Buff-a-Comp with Codeine Equegesic Os-Cal-Gesic   Buffaprin Excedrin plain, buffered or Extra Strength Oxalid   Bufferin Arthritis Strength Feldene Oxphenbutazone   Bufferin plain or Extra Strength Feldene Capsules Oxycodone with Aspirin   Bufferin with Codeine Fenoprofen Fenoprofen Pabalate or Pabalate-SF   Buffets II Flogesic Panagesic   Buffinol plain or Extra Strength Florinal or Florinal with Codeine Panwarfarin   Buf-Tabs Flurbiprofen Penicillamine   Butalbital Compound Four-way cold tablets Penicillin   Butazolidin Fragmin Pepto-Bismol   Carbenicillin Geminisyn Percodan   Carna Arthritis Reliever Geopen Persantine   Carprofen Gold's salt Persistin   Chloramphenicol Goody's Phenylbutazone   Chloromycetin Haltrain Piroxlcam   Clmetidine heparin Plaquenil   Cllnoril Hyco-pap Ponstel   Clofibrate Hydroxy chloroquine Propoxyphen         Before stopping any of these medications, be sure to consult the physician who ordered them.  Some, such as Coumadin (Warfarin) are ordered to prevent or treat serious conditions such as "deep thrombosis", "pumonary embolisms", and other heart problems.  The amount of time that you may need off of the medication may also vary with  the medication and the reason for which you were taking it.  If you are taking any of these medications, please make sure you notify your pain physician before you undergo any procedures.         Epidural Steroid Injection Patient Information  Description: The epidural space surrounds the nerves as they exit the spinal cord.  In some patients, the nerves can be compressed and inflamed by a bulging disc or a tight spinal canal (spinal stenosis).  By injecting steroids into the epidural space, we can bring irritated nerves into direct contact with a potentially helpful medication.  These steroids act directly on the irritated nerves and can reduce swelling and inflammation which often leads to decreased pain.  Epidural steroids may be injected anywhere along the spine and from the neck  to the low back depending upon the location of your pain.   After numbing the skin with local anesthetic (like Novocaine), a small needle is passed into the epidural space slowly.  You may experience a sensation of pressure while this is being done.  The entire block usually last less than 10 minutes.  Conditions which may be treated by epidural steroids:   Low back and leg pain  Neck and arm pain  Spinal stenosis  Post-laminectomy syndrome  Herpes zoster (shingles) pain  Pain from compression fractures  Preparation for the injection:  1. Do not eat any solid food or dairy products within 8 hours of your appointment.  2. You may drink clear liquids up to 3 hours before appointment.  Clear liquids include water, black coffee, juice or soda.  No milk or cream please. 3. You may take your regular medication, including pain medications, with a sip of water before your appointment  Diabetics should hold regular insulin (if taken separately) and take 1/2 normal NPH dos the morning of the procedure.  Carry some sugar containing items with you to your appointment. 4. A driver must accompany you and be prepared  to drive you home after your procedure.  5. Bring all your current medications with your. 6. An IV may be inserted and sedation may be given at the discretion of the physician.   7. A blood pressure cuff, EKG and other monitors will often be applied during the procedure.  Some patients may need to have extra oxygen administered for a short period. 8. You will be asked to provide medical information, including your allergies, prior to the procedure.  We must know immediately if you are taking blood thinners (like Coumadin/Warfarin)  Or if you are allergic to IV iodine contrast (dye). We must know if you could possible be pregnant.  Possible side-effects:  Bleeding from needle site  Infection (rare, may require surgery)  Nerve injury (rare)  Numbness & tingling (temporary)  Difficulty urinating (rare, temporary)  Spinal headache ( a headache worse with upright posture)  Light -headedness (temporary)  Pain at injection site (several days)  Decreased blood pressure (temporary)  Weakness in arm/leg (temporary)  Pressure sensation in back/neck (temporary)  Call if you experience:  Fever/chills associated with headache or increased back/neck pain.  Headache worsened by an upright position.  New onset weakness or numbness of an extremity below the injection site  Hives or difficulty breathing (go to the emergency room)  Inflammation or drainage at the infection site  Severe back/neck pain  Any new symptoms which are concerning to you  Please note:  Although the local anesthetic injected can often make your back or neck feel good for several hours after the injection, the pain will likely return.  It takes 3-7 days for steroids to work in the epidural space.  You may not notice any pain relief for at least that one week.  If effective, we will often do a series of three injections spaced 3-6 weeks apart to maximally decrease your pain.  After the initial series, we generally  will wait several months before considering a repeat injection of the same type.  If you have any questions, please call [phone] [facility]

## 2017-05-22 NOTE — Patient Instructions (Signed)
GENERAL RISKS AND COMPLICATIONS  What are the risk, side effects and possible complications? Generally speaking, most procedures are safe.  However, with any procedure there are risks, side effects, and the possibility of complications.  The risks and complications are dependent upon the sites that are lesioned, or the type of nerve block to be performed.  The closer the procedure is to the spine, the more serious the risks are.  Great care is taken when placing the radio frequency needles, block needles or lesioning probes, but sometimes complications can occur. 1. Infection: Any time there is an injection through the skin, there is a risk of infection.  This is why sterile conditions are used for these blocks.  There are four possible types of infection. 1. Localized skin infection. 2. Central Nervous System Infection-This can be in the form of Meningitis, which can be deadly. 3. Epidural Infections-This can be in the form of an epidural abscess, which can cause pressure inside of the spine, causing compression of the spinal cord with subsequent paralysis. This would require an emergency surgery to decompress, and there are no guarantees that the patient would recover from the paralysis. 4. Discitis-This is an infection of the intervertebral discs.  It occurs in about 1% of discography procedures.  It is difficult to treat and it may lead to surgery.        2. Pain: the needles have to go through skin and soft tissues, will cause soreness.       3. Damage to internal structures:  The nerves to be lesioned may be near blood vessels or    other nerves which can be potentially damaged.       4. Bleeding: Bleeding is more common if the patient is taking blood thinners such as  aspirin, Coumadin, Ticiid, Plavix, etc., or if he/she have some genetic predisposition  such as hemophilia. Bleeding into the spinal canal can cause compression of the spinal  cord with subsequent paralysis.  This would require an  emergency surgery to  decompress and there are no guarantees that the patient would recover from the  paralysis.       5. Pneumothorax:  Puncturing of a lung is a possibility, every time a needle is introduced in  the area of the chest or upper back.  Pneumothorax refers to free air around the  collapsed lung(s), inside of the thoracic cavity (chest cavity).  Another two possible  complications related to a similar event would include: Hemothorax and Chylothorax.   These are variations of the Pneumothorax, where instead of air around the collapsed  lung(s), you may have blood or chyle, respectively.       6. Spinal headaches: They may occur with any procedures in the area of the spine.       7. Persistent CSF (Cerebro-Spinal Fluid) leakage: This is a rare problem, but may occur  with prolonged intrathecal or epidural catheters either due to the formation of a fistulous  track or a dural tear.       8. Nerve damage: By working so close to the spinal cord, there is always a possibility of  nerve damage, which could be as serious as a permanent spinal cord injury with  paralysis.       9. Death:  Although rare, severe deadly allergic reactions known as "Anaphylactic  reaction" can occur to any of the medications used.      10. Worsening of the symptoms:  We can always make thing worse.    What are the chances of something like this happening? Chances of any of this occuring are extremely low.  By statistics, you have more of a chance of getting killed in a motor vehicle accident: while driving to the hospital than any of the above occurring .  Nevertheless, you should be aware that they are possibilities.  In general, it is similar to taking a shower.  Everybody knows that you can slip, hit your head and get killed.  Does that mean that you should not shower again?  Nevertheless always keep in mind that statistics do not mean anything if you happen to be on the wrong side of them.  Even if a procedure has a 1  (one) in a 1,000,000 (million) chance of going wrong, it you happen to be that one..Also, keep in mind that by statistics, you have more of a chance of having something go wrong when taking medications.  Who should not have this procedure? If you are on a blood thinning medication (e.g. Coumadin, Plavix, see list of "Blood Thinners"), or if you have an active infection going on, you should not have the procedure.  If you are taking any blood thinners, please inform your physician.  How should I prepare for this procedure?  Do not eat or drink anything at least six hours prior to the procedure.  Bring a driver with you .  It cannot be a taxi.  Come accompanied by an adult that can drive you back, and that is strong enough to help you if your legs get weak or numb from the local anesthetic.  Take all of your medicines the morning of the procedure with just enough water to swallow them.  If you have diabetes, make sure that you are scheduled to have your procedure done first thing in the morning, whenever possible.  If you have diabetes, take only half of your insulin dose and notify our nurse that you have done so as soon as you arrive at the clinic.  If you are diabetic, but only take blood sugar pills (oral hypoglycemic), then do not take them on the morning of your procedure.  You may take them after you have had the procedure.  Do not take aspirin or any aspirin-containing medications, at least eleven (11) days prior to the procedure.  They may prolong bleeding.  Wear loose fitting clothing that may be easy to take off and that you would not mind if it got stained with Betadine or blood.  Do not wear any jewelry or perfume  Remove any nail coloring.  It will interfere with some of our monitoring equipment.  NOTE: Remember that this is not meant to be interpreted as a complete list of all possible complications.  Unforeseen problems may occur.  BLOOD THINNERS The following drugs  contain aspirin or other products, which can cause increased bleeding during surgery and should not be taken for 2 weeks prior to and 1 week after surgery.  If you should need take something for relief of minor pain, you may take acetaminophen which is found in Tylenol,m Datril, Anacin-3 and Panadol. It is not blood thinner. The products listed below are.  Do not take any of the products listed below in addition to any listed on your instruction sheet.  A.P.C or A.P.C with Codeine Codeine Phosphate Capsules #3 Ibuprofen Ridaura  ABC compound Congesprin Imuran rimadil  Advil Cope Indocin Robaxisal  Alka-Seltzer Effervescent Pain Reliever and Antacid Coricidin or Coricidin-D  Indomethacin Rufen    Alka-Seltzer plus Cold Medicine Cosprin Ketoprofen S-A-C Tablets  Anacin Analgesic Tablets or Capsules Coumadin Korlgesic Salflex  Anacin Extra Strength Analgesic tablets or capsules CP-2 Tablets Lanoril Salicylate  Anaprox Cuprimine Capsules Levenox Salocol  Anexsia-D Dalteparin Magan Salsalate  Anodynos Darvon compound Magnesium Salicylate Sine-off  Ansaid Dasin Capsules Magsal Sodium Salicylate  Anturane Depen Capsules Marnal Soma  APF Arthritis pain formula Dewitt's Pills Measurin Stanback  Argesic Dia-Gesic Meclofenamic Sulfinpyrazone  Arthritis Bayer Timed Release Aspirin Diclofenac Meclomen Sulindac  Arthritis pain formula Anacin Dicumarol Medipren Supac  Analgesic (Safety coated) Arthralgen Diffunasal Mefanamic Suprofen  Arthritis Strength Bufferin Dihydrocodeine Mepro Compound Suprol  Arthropan liquid Dopirydamole Methcarbomol with Aspirin Synalgos  ASA tablets/Enseals Disalcid Micrainin Tagament  Ascriptin Doan's Midol Talwin  Ascriptin A/D Dolene Mobidin Tanderil  Ascriptin Extra Strength Dolobid Moblgesic Ticlid  Ascriptin with Codeine Doloprin or Doloprin with Codeine Momentum Tolectin  Asperbuf Duoprin Mono-gesic Trendar  Aspergum Duradyne Motrin or Motrin IB Triminicin  Aspirin  plain, buffered or enteric coated Durasal Myochrisine Trigesic  Aspirin Suppositories Easprin Nalfon Trillsate  Aspirin with Codeine Ecotrin Regular or Extra Strength Naprosyn Uracel  Atromid-S Efficin Naproxen Ursinus  Auranofin Capsules Elmiron Neocylate Vanquish  Axotal Emagrin Norgesic Verin  Azathioprine Empirin or Empirin with Codeine Normiflo Vitamin E  Azolid Emprazil Nuprin Voltaren  Bayer Aspirin plain, buffered or children's or timed BC Tablets or powders Encaprin Orgaran Warfarin Sodium  Buff-a-Comp Enoxaparin Orudis Zorpin  Buff-a-Comp with Codeine Equegesic Os-Cal-Gesic   Buffaprin Excedrin plain, buffered or Extra Strength Oxalid   Bufferin Arthritis Strength Feldene Oxphenbutazone   Bufferin plain or Extra Strength Feldene Capsules Oxycodone with Aspirin   Bufferin with Codeine Fenoprofen Fenoprofen Pabalate or Pabalate-SF   Buffets II Flogesic Panagesic   Buffinol plain or Extra Strength Florinal or Florinal with Codeine Panwarfarin   Buf-Tabs Flurbiprofen Penicillamine   Butalbital Compound Four-way cold tablets Penicillin   Butazolidin Fragmin Pepto-Bismol   Carbenicillin Geminisyn Percodan   Carna Arthritis Reliever Geopen Persantine   Carprofen Gold's salt Persistin   Chloramphenicol Goody's Phenylbutazone   Chloromycetin Haltrain Piroxlcam   Clmetidine heparin Plaquenil   Cllnoril Hyco-pap Ponstel   Clofibrate Hydroxy chloroquine Propoxyphen         Before stopping any of these medications, be sure to consult the physician who ordered them.  Some, such as Coumadin (Warfarin) are ordered to prevent or treat serious conditions such as "deep thrombosis", "pumonary embolisms", and other heart problems.  The amount of time that you may need off of the medication may also vary with the medication and the reason for which you were taking it.  If you are taking any of these medications, please make sure you notify your pain physician before you undergo any  procedures.         Epidural Steroid Injection Patient Information  Description: The epidural space surrounds the nerves as they exit the spinal cord.  In some patients, the nerves can be compressed and inflamed by a bulging disc or a tight spinal canal (spinal stenosis).  By injecting steroids into the epidural space, we can bring irritated nerves into direct contact with a potentially helpful medication.  These steroids act directly on the irritated nerves and can reduce swelling and inflammation which often leads to decreased pain.  Epidural steroids may be injected anywhere along the spine and from the neck to the low back depending upon the location of your pain.   After numbing the skin with local anesthetic (like Novocaine), a small needle is passed   into the epidural space slowly.  You may experience a sensation of pressure while this is being done.  The entire block usually last less than 10 minutes.  Conditions which may be treated by epidural steroids:   Low back and leg pain  Neck and arm pain  Spinal stenosis  Post-laminectomy syndrome  Herpes zoster (shingles) pain  Pain from compression fractures  Preparation for the injection:  1. Do not eat any solid food or dairy products within 8 hours of your appointment.  2. You may drink clear liquids up to 3 hours before appointment.  Clear liquids include water, black coffee, juice or soda.  No milk or cream please. 3. You may take your regular medication, including pain medications, with a sip of water before your appointment  Diabetics should hold regular insulin (if taken separately) and take 1/2 normal NPH dos the morning of the procedure.  Carry some sugar containing items with you to your appointment. 4. A driver must accompany you and be prepared to drive you home after your procedure.  5. Bring all your current medications with your. 6. An IV may be inserted and sedation may be given at the discretion of the  physician.   7. A blood pressure cuff, EKG and other monitors will often be applied during the procedure.  Some patients may need to have extra oxygen administered for a short period. 8. You will be asked to provide medical information, including your allergies, prior to the procedure.  We must know immediately if you are taking blood thinners (like Coumadin/Warfarin)  Or if you are allergic to IV iodine contrast (dye). We must know if you could possible be pregnant.  Possible side-effects:  Bleeding from needle site  Infection (rare, may require surgery)  Nerve injury (rare)  Numbness & tingling (temporary)  Difficulty urinating (rare, temporary)  Spinal headache ( a headache worse with upright posture)  Light -headedness (temporary)  Pain at injection site (several days)  Decreased blood pressure (temporary)  Weakness in arm/leg (temporary)  Pressure sensation in back/neck (temporary)  Call if you experience:  Fever/chills associated with headache or increased back/neck pain.  Headache worsened by an upright position.  New onset weakness or numbness of an extremity below the injection site  Hives or difficulty breathing (go to the emergency room)  Inflammation or drainage at the infection site  Severe back/neck pain  Any new symptoms which are concerning to you  Please note:  Although the local anesthetic injected can often make your back or neck feel good for several hours after the injection, the pain will likely return.  It takes 3-7 days for steroids to work in the epidural space.  You may not notice any pain relief for at least that one week.  If effective, we will often do a series of three injections spaced 3-6 weeks apart to maximally decrease your pain.  After the initial series, we generally will wait several months before considering a repeat injection of the same type.  If you have any questions, please call [phone] [facility] 

## 2017-05-28 ENCOUNTER — Ambulatory Visit: Payer: Medicare Other | Admitting: Student in an Organized Health Care Education/Training Program

## 2017-05-28 NOTE — Progress Notes (Signed)
Procedures cancelled d/t patient not having pain.  No admission today.

## 2017-06-08 DIAGNOSIS — K5909 Other constipation: Secondary | ICD-10-CM | POA: Insufficient documentation

## 2017-06-25 ENCOUNTER — Other Ambulatory Visit: Payer: Self-pay | Admitting: Internal Medicine

## 2017-06-25 DIAGNOSIS — Z1231 Encounter for screening mammogram for malignant neoplasm of breast: Secondary | ICD-10-CM

## 2017-07-14 ENCOUNTER — Encounter: Payer: Self-pay | Admitting: Student in an Organized Health Care Education/Training Program

## 2017-07-14 ENCOUNTER — Ambulatory Visit (HOSPITAL_BASED_OUTPATIENT_CLINIC_OR_DEPARTMENT_OTHER): Payer: 59 | Admitting: Student in an Organized Health Care Education/Training Program

## 2017-07-14 ENCOUNTER — Ambulatory Visit
Admission: RE | Admit: 2017-07-14 | Discharge: 2017-07-14 | Disposition: A | Discharge status: Home / Self Care | Payer: 59 | Source: Ambulatory Visit | Attending: Student in an Organized Health Care Education/Training Program | Admitting: Student in an Organized Health Care Education/Training Program

## 2017-07-14 VITALS — BP 172/89 | HR 76 | Temp 98.3°F | Resp 16 | Ht 67.0 in | Wt 212.0 lb

## 2017-07-14 DIAGNOSIS — M9983 Other biomechanical lesions of lumbar region: Secondary | ICD-10-CM

## 2017-07-14 DIAGNOSIS — M5116 Intervertebral disc disorders with radiculopathy, lumbar region: Secondary | ICD-10-CM | POA: Diagnosis not present

## 2017-07-14 DIAGNOSIS — M25511 Pain in right shoulder: Secondary | ICD-10-CM | POA: Insufficient documentation

## 2017-07-14 DIAGNOSIS — Z79899 Other long term (current) drug therapy: Secondary | ICD-10-CM | POA: Diagnosis not present

## 2017-07-14 DIAGNOSIS — Z888 Allergy status to other drugs, medicaments and biological substances status: Secondary | ICD-10-CM | POA: Insufficient documentation

## 2017-07-14 DIAGNOSIS — M48061 Spinal stenosis, lumbar region without neurogenic claudication: Secondary | ICD-10-CM

## 2017-07-14 DIAGNOSIS — Z886 Allergy status to analgesic agent status: Secondary | ICD-10-CM | POA: Insufficient documentation

## 2017-07-14 DIAGNOSIS — M5136 Other intervertebral disc degeneration, lumbar region: Secondary | ICD-10-CM | POA: Insufficient documentation

## 2017-07-14 DIAGNOSIS — M51369 Other intervertebral disc degeneration, lumbar region without mention of lumbar back pain or lower extremity pain: Secondary | ICD-10-CM

## 2017-07-14 DIAGNOSIS — Z79890 Hormone replacement therapy (postmenopausal): Secondary | ICD-10-CM | POA: Diagnosis not present

## 2017-07-14 DIAGNOSIS — Z885 Allergy status to narcotic agent status: Secondary | ICD-10-CM | POA: Insufficient documentation

## 2017-07-14 DIAGNOSIS — M5416 Radiculopathy, lumbar region: Secondary | ICD-10-CM

## 2017-07-14 DIAGNOSIS — Z96652 Presence of left artificial knee joint: Secondary | ICD-10-CM | POA: Diagnosis not present

## 2017-07-14 MED ORDER — DEXAMETHASONE SODIUM PHOSPHATE 10 MG/ML IJ SOLN
10.0000 mg | Freq: Once | INTRAMUSCULAR | Status: AC
  Administered 2017-07-14: 10 mg

## 2017-07-14 MED ORDER — IOPAMIDOL (ISOVUE-M 200) INJECTION 41%
10.0000 mL | Freq: Once | INTRAMUSCULAR | Status: AC
  Administered 2017-07-14: 10 mL via EPIDURAL
  Filled 2017-07-14: qty 10

## 2017-07-14 MED ORDER — SODIUM CHLORIDE 0.9% FLUSH
2.0000 mL | Freq: Once | INTRAVENOUS | Status: AC
  Administered 2017-07-14: 10 mL

## 2017-07-14 MED ORDER — LIDOCAINE HCL (PF) 1 % IJ SOLN
INTRAMUSCULAR | Status: AC
  Filled 2017-07-14: qty 5

## 2017-07-14 MED ORDER — DEXAMETHASONE SODIUM PHOSPHATE 10 MG/ML IJ SOLN
INTRAMUSCULAR | Status: AC
  Filled 2017-07-14: qty 1

## 2017-07-14 MED ORDER — SODIUM CHLORIDE 0.9 % IJ SOLN
INTRAMUSCULAR | Status: AC
  Filled 2017-07-14: qty 10

## 2017-07-14 MED ORDER — LIDOCAINE HCL (PF) 1 % IJ SOLN
4.5000 mL | Freq: Once | INTRAMUSCULAR | Status: AC
  Administered 2017-07-14: 5 mL

## 2017-07-14 MED ORDER — ROPIVACAINE HCL 2 MG/ML IJ SOLN
2.0000 mL | Freq: Once | INTRAMUSCULAR | Status: AC
  Administered 2017-07-14: 10 mL via EPIDURAL
  Filled 2017-07-14: qty 10

## 2017-07-14 NOTE — Patient Instructions (Signed)

## 2017-07-14 NOTE — Progress Notes (Signed)
Safety precautions to be maintained throughout the outpatient stay will include: orient to surroundings, keep bed in low position, maintain call bell within reach at all times, provide assistance with transfer out of bed and ambulation.  

## 2017-07-14 NOTE — Progress Notes (Signed)
Patient's Name: Erika Crawford  MRN: [id number]  Referring Provider: Glendon Axe, MD  DOB: 08/10/1940  PCP: Glendon Axe, MD  DOS: 07/14/2017  Note by: [name], MD  Service setting: Ambulatory outpatient  Specialty: Interventional Pain Management  Patient type: Established  Location: [facility] (AMB) Pain Management Facility  Visit type: Interventional Procedure   Primary Reason for Visit: Interventional Pain Management Treatment. CC: Back Pain (lower middle, right side through waste line) and Shoulder Pain (right, needs surgical procedure. )  Procedure:  Anesthesia, Analgesia, Anxiolysis:  Type: Diagnostic Inter-Laminar Epidural Steroid Injection Region: Lumbar Level: L5-S1 Level. Laterality: Midline         Type: Local Anesthesia Local Anesthetic: Lidocaine 1% Route: Infiltration (Crisp/IM) IV Access: Declined Sedation: Meaningful verbal contact was maintained at all times during the procedure  Indication(s): Analgesia and Anxiety   Indications: 1. Lumbar radiculopathy   2. Lumbar degenerative disc disease   3. Lumbar foraminal stenosis    Pain Score: Pre-procedure: 3/10 Post-procedure: 0-No pain/10  Pre-op Assessment:  Erika Crawford is a 77 y.o. (year old), female patient, seen today for interventional treatment. 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 Erika Crawford has a current medication list which includes the following prescription(s): acetaminophen, alendronate, b complex vitamins, calcium carbonate-vitamin d, cyclobenzaprine, diclofenac, diclofenac sodium, levothyroxine, mometasone, olmesartan, senna-docusate, tapentadol, amlodipine, amlodipine-valsartan, omeprazole, valsartan, and ergocalciferol. Her primarily concern today is the Back Pain (lower middle, right side through waste line) and Shoulder Pain (right, needs surgical  procedure. )  Initial Vital Signs: Blood pressure 139/68, pulse 69, temperature 98.2 F (36.8 C), temperature source Oral, resp. rate 16, height 5\' 7"  (1.702 m), weight 212 lb (96.2 kg), SpO2 98 %. BMI: Estimated body mass index is 33.2 kg/m as calculated from the following:   Height as of this encounter: 5\' 7"  (1.702 m).   Weight as of this encounter: 212 lb (96.2 kg).  Risk Assessment: Allergies: Reviewed. She is allergic to celebrex [celecoxib]; codeine; etodolac; hydrochlorothiazide; oxycodone; pravastatin; relafen [nabumetone]; and tramadol.  Allergy Precautions: None required Coagulopathies: Reviewed. None identified.  Blood-thinner therapy: None at this time Active Infection(s): Reviewed. None identified. Erika Crawford is afebrile  Site Confirmation: Erika Crawford was asked to confirm the procedure and laterality before marking the site Procedure checklist: Completed Consent: Before the procedure and under the influence of no sedative(s), amnesic(s), or anxiolytics, the patient was informed of the treatment options, risks and possible complications. To fulfill our ethical and legal obligations, as recommended by the American Medical Association's Code of Ethics, I have informed the patient of my clinical impression; the nature and purpose of the treatment or procedure; the risks, benefits, and possible complications of the intervention; the alternatives, including doing nothing; the risk(s) and benefit(s) of the alternative treatment(s) or procedure(s); and the risk(s) and benefit(s) of doing nothing. The patient was provided information about the general risks and possible complications associated with the procedure. These may include, but are not limited to: failure to achieve desired goals, infection, bleeding, organ or nerve damage, allergic reactions, paralysis, and death. In addition, the patient was informed of those risks and complications associated to Spine-related procedures, such as  failure to decrease pain; infection (i.e.: Meningitis, epidural or intraspinal abscess); bleeding (i.e.: epidural hematoma, subarachnoid hemorrhage, or any other type of intraspinal or peri-dural bleeding); organ or nerve damage (i.e.: Any type of peripheral nerve, nerve root, or spinal  cord injury) with subsequent damage to sensory, motor, and/or autonomic systems, resulting in permanent pain, numbness, and/or weakness of one or several areas of the body; allergic reactions; (i.e.: anaphylactic reaction); and/or death. Furthermore, the patient was informed of those risks and complications associated with the medications. These include, but are not limited to: allergic reactions (i.e.: anaphylactic or anaphylactoid reaction(s)); adrenal axis suppression; blood sugar elevation that in diabetics may result in ketoacidosis or comma; water retention that in patients with history of congestive heart failure may result in shortness of breath, pulmonary edema, and decompensation with resultant heart failure; weight gain; swelling or edema; medication-induced neural toxicity; particulate matter embolism and blood vessel occlusion with resultant organ, and/or nervous system infarction; and/or aseptic necrosis of one or more joints. Finally, the patient was informed that Medicine is not an exact science; therefore, there is also the possibility of unforeseen or unpredictable risks and/or possible complications that may result in a catastrophic outcome. The patient indicated having understood very clearly. We have given the patient no guarantees and we have made no promises. Enough time was given to the patient to ask questions, all of which were answered to the patient's satisfaction. Erika Crawford has indicated that she wanted to continue with the procedure. Attestation: I, the ordering provider, attest that I have discussed with the patient the benefits, risks, side-effects, alternatives, likelihood of achieving goals, and  potential problems during recovery for the procedure that I have provided informed consent. Date: 07/14/2017; Time: 9:56 AM  Pre-Procedure Preparation:  Monitoring: As per clinic protocol. Respiration, ETCO2, SpO2, BP, heart rate and rhythm monitor placed and checked for adequate function Safety Precautions: Patient was assessed for positional comfort and pressure points before starting the procedure. Time-out: I initiated and conducted the "Time-out" before starting the procedure, as per protocol. The patient was asked to participate by confirming the accuracy of the "Time Out" information. Verification of the correct person, site, and procedure were performed and confirmed by me, the nursing staff, and the patient. "Time-out" conducted as per Joint Commission's Universal Protocol (UP.01.01.01). "Time-out" Date & Time: 07/14/2017; 0928 hrs.  Description of Procedure Process:   Position: Prone with head of the table was raised to facilitate breathing. Target Area: The interlaminar space, initially targeting the lower laminar border of the superior vertebral body. Approach: Paramedial approach. Area Prepped: Entire Posterior Lumbar Region Prepping solution: ChloraPrep (2% chlorhexidine gluconate and 70% isopropyl alcohol) Safety Precautions: Aspiration looking for blood return was conducted prior to all injections. At no point did we inject any substances, as a needle was being advanced. No attempts were made at seeking any paresthesias. Safe injection practices and needle disposal techniques used. Medications properly checked for expiration dates. SDV (single dose vial) medications used. Description of the Procedure: Protocol guidelines were followed. The procedure needle was introduced through the skin, ipsilateral to the reported pain, and advanced to the target area. Bone was contacted and the needle walked caudad, until the lamina was cleared. The epidural space was identified using  "loss-of-resistance technique" with 2-3 ml of PF-NaCl (0.9% NSS), in a 5cc LOR glass syringe. Vitals:   07/14/17 0854 07/14/17 0927 07/14/17 0933 07/14/17 0937  BP: (!) 144/65 (!) 166/77 (!) 168/84 (!) 172/89  Pulse: 76     Resp: 16 14 16 16   Temp: 98.3 F (36.8 C)     TempSrc: Oral     SpO2: 100% 97% 97% 100%  Weight: 212 lb (96.2 kg)     Height: 5\' 7"  (1.702  m)       Start Time: 0928 hrs. End Time: 0937 hrs. Materials:  Needle(s) Type: Epidural needle Gauge: 17G Length: 3.5-in Medication(s): We administered iopamidol, ropivacaine (PF) 2 mg/mL (0.2%), sodium chloride flush, lidocaine (PF), and dexamethasone. Please see chart orders for dosing details. 7 cc: 5 cc of preservative-free normal saline, 1 cc of 0.2% ropivacaine, 1 cc of Decadron (10 mg/cc) Imaging Guidance (Spinal):  Type of Imaging Technique: Fluoroscopy Guidance (Spinal) Indication(s): Assistance in needle guidance and placement for procedures requiring needle placement in or near specific anatomical locations not easily accessible without such assistance. Exposure Time: Please see nurses notes. Contrast: Before injecting any contrast, we confirmed that the patient did not have an allergy to iodine, shellfish, or radiological contrast. Once satisfactory needle placement was completed at the desired level, radiological contrast was injected. Contrast injected under live fluoroscopy. No contrast complications. See chart for type and volume of contrast used. Fluoroscopic Guidance: I was personally present during the use of fluoroscopy. "Tunnel Vision Technique" used to obtain the best possible view of the target area. Parallax error corrected before commencing the procedure. "Direction-depth-direction" technique used to introduce the needle under continuous pulsed fluoroscopy. Once target was reached, antero-posterior, oblique, and lateral fluoroscopic projection used confirm needle placement in all planes. Images permanently  stored in EMR. Interpretation: I personally interpreted the imaging intraoperatively. Adequate needle placement confirmed in multiple planes. Appropriate spread of contrast into desired area was observed. No evidence of afferent or efferent intravascular uptake. No intrathecal or subarachnoid spread observed. Permanent images saved into the patient's record.  Antibiotic Prophylaxis:  Indication(s): None identified Antibiotic given: None  Post-operative Assessment:  EBL: None Complications: No immediate post-treatment complications observed by team, or reported by patient. Note: The patient tolerated the entire procedure well. A repeat set of vitals were taken after the procedure and the patient was kept under observation following institutional policy, for this type of procedure. Post-procedural neurological assessment was performed, showing return to baseline, prior to discharge. The patient was provided with post-procedure discharge instructions, including a section on how to identify potential problems. Should any problems arise concerning this procedure, the patient was given instructions to immediately contact us, at any time, without hesitation. In any case, we plan to contact the patient by telephone for a follow-up status report regarding this interventional procedure. Comments:  No additional relevant information. 5 out of 5 strength bilateral lower extremity: Plantar flexion, dorsiflexion, knee flexion, knee extension. Plan of Care  Follow-up in 4 weeks for postprocedure evaluation.  Imaging Orders     DG C-Arm 1-60 Min-No Report Procedure Orders    No procedure(s) ordered today    Medications ordered for procedure: Meds ordered this encounter  Medications  . iopamidol (ISOVUE-M) 41 % intrathecal injection 10 mL  . ropivacaine (PF) 2 mg/mL (0.2%) (NAROPIN) injection 2 mL  . sodium chloride flush (NS) 0.9 % injection 2 mL  . lidocaine (PF) (XYLOCAINE) 1 % injection 4.5 mL  .  dexamethasone (DECADRON) injection 10 mg   Medications administered: We administered iopamidol, ropivacaine (PF) 2 mg/mL (0.2%), sodium chloride flush, lidocaine (PF), and dexamethasone.  See the medical record for exact dosing, route, and time of administration.  New Prescriptions   No medications on file   Disposition: Discharge home  Discharge Date & Time: 07/14/2017; 0940 hrs.   Physician-requested Follow-up: Return in about 4 weeks (around 08/11/2017) for Post Procedure Evaluation. Future Appointments  Date Time Provider [place]  08/07/2017  2:00 PM [facility]  1 [facility]  08/12/2017  1:00 PM [name], MD [facility] None   Primary Care Physician: Glendon Axe, MD Location: [facility] Note by: [name], MD Date: 07/14/2017; Time: 9:59 AM  Disclaimer:  Medicine is not an exact science. The only guarantee in medicine is that nothing is guaranteed. It is important to note that the decision to proceed with this intervention was based on the information collected from the patient. The Data and conclusions were drawn from the patient's questionnaire, the interview, and the physical examination. Because the information was provided in large part by the patient, it cannot be guaranteed that it has not been purposely or unconsciously manipulated. Every effort has been made to obtain as much relevant data as possible for this evaluation. It is important to note that the conclusions that lead to this procedure are derived in large part from the available data. Always take into account that the treatment will also be dependent on availability of resources and existing treatment guidelines, considered by other Pain Management Practitioners as being common knowledge and practice, at the time of the intervention. For Medico-Legal purposes, it is also important to point out that variation in procedural techniques and pharmacological choices are the acceptable  norm. The indications, contraindications, technique, and results of the above procedure should only be interpreted and judged by a Board-Certified Interventional Pain Specialist with extensive familiarity and expertise in the same exact procedure and technique.

## 2017-07-15 ENCOUNTER — Telehealth: Payer: Self-pay | Admitting: *Deleted

## 2017-07-15 NOTE — Telephone Encounter (Signed)
Denies any problems post procedure

## 2017-07-31 ENCOUNTER — Telehealth: Payer: Self-pay | Admitting: Student in an Organized Health Care Education/Training Program

## 2017-07-31 DIAGNOSIS — M5416 Radiculopathy, lumbar region: Secondary | ICD-10-CM

## 2017-07-31 NOTE — Telephone Encounter (Signed)
Patient called requesting repeat lumbar epidural steroid injection.  Order placed.    Orders Placed This Encounter  Procedures  . Lumbar Epidural Injection    Standing Status:   Future    Standing Expiration Date:   08/30/2017    Scheduling Instructions:     Side: Left-sided     Sedation: With Sedation.     Timeframe: ASAP    Order Specific Question:   Where will this procedure be performed?    Answer:   [facility]

## 2017-07-31 NOTE — Telephone Encounter (Signed)
Message routed to Dr Holley Raring

## 2017-07-31 NOTE — Telephone Encounter (Signed)
Patient is hurting and would like to come in for another procedure. She is sched 07-12-17 for follow up. Can she have procedure next week? Please call patient to talk about her pain and what she needs.

## 2017-08-07 ENCOUNTER — Ambulatory Visit
Admission: RE | Admit: 2017-08-07 | Discharge: 2017-08-07 | Disposition: A | Discharge status: Home / Self Care | Payer: 59 | Source: Ambulatory Visit | Attending: Internal Medicine | Admitting: Internal Medicine

## 2017-08-07 DIAGNOSIS — Z1231 Encounter for screening mammogram for malignant neoplasm of breast: Secondary | ICD-10-CM

## 2017-08-11 ENCOUNTER — Ambulatory Visit: Payer: 59 | Admitting: Student in an Organized Health Care Education/Training Program

## 2017-08-12 ENCOUNTER — Ambulatory Visit
Payer: 59 | Attending: Student in an Organized Health Care Education/Training Program | Admitting: Student in an Organized Health Care Education/Training Program

## 2017-08-12 ENCOUNTER — Other Ambulatory Visit: Payer: Self-pay

## 2017-08-12 ENCOUNTER — Encounter: Payer: Self-pay | Admitting: Student in an Organized Health Care Education/Training Program

## 2017-08-12 VITALS — BP 119/77 | HR 83 | Temp 98.1°F | Resp 16 | Ht 67.0 in | Wt 215.0 lb

## 2017-08-12 DIAGNOSIS — E039 Hypothyroidism, unspecified: Secondary | ICD-10-CM | POA: Diagnosis not present

## 2017-08-12 DIAGNOSIS — N859 Noninflammatory disorder of uterus, unspecified: Secondary | ICD-10-CM | POA: Diagnosis not present

## 2017-08-12 DIAGNOSIS — M5116 Intervertebral disc disorders with radiculopathy, lumbar region: Secondary | ICD-10-CM | POA: Diagnosis not present

## 2017-08-12 DIAGNOSIS — Z79890 Hormone replacement therapy (postmenopausal): Secondary | ICD-10-CM | POA: Insufficient documentation

## 2017-08-12 DIAGNOSIS — Z885 Allergy status to narcotic agent status: Secondary | ICD-10-CM | POA: Insufficient documentation

## 2017-08-12 DIAGNOSIS — M9983 Other biomechanical lesions of lumbar region: Secondary | ICD-10-CM

## 2017-08-12 DIAGNOSIS — M5416 Radiculopathy, lumbar region: Secondary | ICD-10-CM

## 2017-08-12 DIAGNOSIS — M25511 Pain in right shoulder: Secondary | ICD-10-CM | POA: Diagnosis present

## 2017-08-12 DIAGNOSIS — Z888 Allergy status to other drugs, medicaments and biological substances status: Secondary | ICD-10-CM | POA: Insufficient documentation

## 2017-08-12 DIAGNOSIS — G8929 Other chronic pain: Secondary | ICD-10-CM

## 2017-08-12 DIAGNOSIS — I10 Essential (primary) hypertension: Secondary | ICD-10-CM | POA: Insufficient documentation

## 2017-08-12 DIAGNOSIS — M5442 Lumbago with sciatica, left side: Secondary | ICD-10-CM

## 2017-08-12 DIAGNOSIS — G4730 Sleep apnea, unspecified: Secondary | ICD-10-CM | POA: Insufficient documentation

## 2017-08-12 DIAGNOSIS — Z96652 Presence of left artificial knee joint: Secondary | ICD-10-CM | POA: Diagnosis not present

## 2017-08-12 DIAGNOSIS — M48061 Spinal stenosis, lumbar region without neurogenic claudication: Secondary | ICD-10-CM | POA: Diagnosis not present

## 2017-08-12 DIAGNOSIS — N1330 Unspecified hydronephrosis: Secondary | ICD-10-CM | POA: Diagnosis not present

## 2017-08-12 DIAGNOSIS — Z886 Allergy status to analgesic agent status: Secondary | ICD-10-CM | POA: Diagnosis not present

## 2017-08-12 DIAGNOSIS — M5136 Other intervertebral disc degeneration, lumbar region: Secondary | ICD-10-CM | POA: Diagnosis not present

## 2017-08-12 DIAGNOSIS — Z79899 Other long term (current) drug therapy: Secondary | ICD-10-CM | POA: Insufficient documentation

## 2017-08-12 DIAGNOSIS — G4733 Obstructive sleep apnea (adult) (pediatric): Secondary | ICD-10-CM | POA: Insufficient documentation

## 2017-08-12 DIAGNOSIS — M19011 Primary osteoarthritis, right shoulder: Secondary | ICD-10-CM | POA: Diagnosis not present

## 2017-08-12 NOTE — Progress Notes (Signed)
Patient's Name: Erika Crawford  MRN: [id number]  Referring Provider: Glendon Axe, MD  DOB: 07-Jun-1941  PCP: Glendon Axe, MD  DOS: 08/12/2017  Note by: [name], MD  Service setting: Ambulatory outpatient  Specialty: Interventional Pain Management  Location: [facility] (AMB) Pain Management Facility    Patient type: Established   Primary Reason(s) for Visit: Encounter for post-procedure evaluation of chronic illness with mild to moderate exacerbation CC: Shoulder Pain (right)  HPI  Erika Crawford is a 77 y.o. year old, female patient, who comes today for a post-procedure evaluation. She has Status post total knee replacement using cement, left; Rotator cuff tendinitis, right; Rotator cuff tendinitis, left; Primary osteoarthritis of right shoulder; Osteoarthritis; Obstructive sleep apnea on CPAP; Injury of tendon of long head of right biceps; Incomplete tear of right rotator cuff; Disorder of uterus; Bilateral hydronephrosis; and Lumbar radiculopathy on their problem list. Her primarily concern today is the Shoulder Pain (right)  Pain Assessment: Location: Right Shoulder Radiating: denies Onset: More than a month ago Duration: Chronic pain Quality: Aching, Throbbing Severity: 0/10 (self-reported pain score)  Note: Reported level is compatible with observation.                         When using our objective Pain Scale, levels between 6 and 10/10 are said to belong in an emergency room, as it progressively worsens from a 6/10, described as severely limiting, requiring emergency care not usually available at an outpatient pain management facility. At a 6/10 level, communication becomes difficult and requires great effort. Assistance to reach the emergency department may be required. Facial flushing and profuse sweating along with potentially dangerous increases in heart rate and blood pressure will be evident. Effect on ADL: pain is worse when right shoulder is engaged while dressing, self care,  working in kitchen Timing: Intermittent Modifying factors: medications  Erika Crawford comes in today for post-procedure evaluation after the treatment done on 07/31/2017.  Further details on both, my assessment(s), as well as the proposed treatment plan, please see below.  Post-Procedure Assessment  07/31/2017 Procedure: L5/S1 ESI Pre-procedure pain score:  3/10 Post-procedure pain score: 0/10         Influential Factors: BMI: 33.67 kg/m Intra-procedural challenges: None observed.         Assessment challenges: None detected.              Reported side-effects: None.        Post-procedural adverse reactions or complications: None reported         Sedation: Please see nurses note. When no sedatives are used, the analgesic levels obtained are directly associated to the effectiveness of the local anesthetics. However, when sedation is provided, the level of analgesia obtained during the initial 1 hour following the intervention, is believed to be the result of a combination of factors. These factors may include, but are not limited to: 1. The effectiveness of the local anesthetics used. 2. The effects of the analgesic(s) and/or anxiolytic(s) used. 3. The degree of discomfort experienced by the patient at the time of the procedure. 4. The patients ability and reliability in recalling and recording the events. 5. The presence and influence of possible secondary gains and/or psychosocial factors. Reported result: Relief experienced during the 1st hour after the procedure: 100 % (Ultra-Short Term Relief)            Interpretative annotation: Clinically appropriate result. Analgesia during this period is likely to be Local Anesthetic and/or  IV Sedative (Analgesic/Anxiolytic) related.          Effects of local anesthetic: The analgesic effects attained during this period are directly associated to the localized infiltration of local anesthetics and therefore cary significant diagnostic value as to the  etiological location, or anatomical origin, of the pain. Expected duration of relief is directly dependent on the pharmacodynamics of the local anesthetic used. Long-acting (4-6 hours) anesthetics used.  Reported result: Relief during the next 4 to 6 hour after the procedure: 100 % (Short-Term Relief)            Interpretative annotation: Clinically appropriate result. Analgesia during this period is likely to be Local Anesthetic-related.          Long-term benefit: Defined as the period of time past the expected duration of local anesthetics (1 hour for short-acting and 4-6 hours for long-acting). With the possible exception of prolonged sympathetic blockade from the local anesthetics, benefits during this period are typically attributed to, or associated with, other factors such as analgesic sensory neuropraxia, antiinflammatory effects, or beneficial biochemical changes provided by agents other than the local anesthetics.  Reported result: Extended relief following procedure: 75 %(50-100%) (Long-Term Relief)            Interpretative annotation: Clinically appropriate result. Good relief. Therapeutic success. Inflammation plays a part in the etiology to the pain.          Current benefits: Defined as reported results that persistent at this point in time.   Analgesia: 75-100 % Erika Crawford reports that both, extremity and the axial pain improved with the treatment. Function: Somewhat improved ROM: Somewhat improved Interpretative annotation: Ongoing benefit. Therapeutic benefit observed. Effective therapeutic approach.          Interpretation: Results would suggest a successful diagnostic intervention.                  Plan:  Please see "Plan of Care" for details.                Laboratory Chemistry  Inflammation Markers (CRP: Acute Phase) (ESR: Chronic Phase) No results found for: CRP, ESRSEDRATE, LATICACIDVEN                       Rheumatology Markers No results found for: RF, ANA,  Erika Crawford, [facility]              Renal Function Markers Lab Results  Component Value Date   BUN 13 04/15/2016   CREATININE 0.64 04/15/2016   GFRAA >60 04/15/2016   GFRNONAA >60 04/15/2016                 Hepatic Function Markers Lab Results  Component Value Date   AST 16 01/19/2014   ALT 25 01/19/2014   ALBUMIN 3.6 01/19/2014   ALKPHOS 62 01/19/2014                 Electrolytes Lab Results  Component Value Date   NA 136 04/15/2016   K 3.3 (L) 04/15/2016   CL 101 04/15/2016   CALCIUM 9.5 04/15/2016                        Neuropathy Markers No results found for: VITAMINB12, FOLATE, HGBA1C, HIV               Bone Pathology Markers No results found for: [place], [id number], [id number], [id number], 25OHVITD1, 25OHVITD2, 25OHVITD3, TESTOFREE, TESTOSTERONE  Coagulation Parameters Lab Results  Component Value Date   INR 1.06 04/15/2016   LABPROT 13.8 04/15/2016   APTT 30 04/15/2016   PLT 264 04/15/2016                 Cardiovascular Markers Lab Results  Component Value Date   TROPONINI <0.03 11/01/2014   HGB 12.3 04/15/2016   HCT 37.6 04/15/2016                 CA Markers No results found for: CEA, CA125, LABCA2               Note: Lab results reviewed.  Recent Diagnostic Imaging Results  MM SCREENING BREAST TOMO BILATERAL CLINICAL DATA:  Screening.  EXAM: DIGITAL SCREENING BILATERAL MAMMOGRAM WITH TOMO AND CAD  COMPARISON:  Previous exam(s).  ACR Breast Density Category b: There are scattered areas of fibroglandular density.  FINDINGS: There are no findings suspicious for malignancy. Images were processed with CAD.  IMPRESSION: No mammographic evidence of malignancy. A result letter of this screening mammogram will be mailed directly to the patient.  RECOMMENDATION: Screening mammogram in one year. (Code:[id number]-01Y)  BI-RADS CATEGORY  1: Negative.  Electronically Signed   By: Nolon Nations M.D.    On: 08/07/2017 16:15  Complexity Note: Imaging results reviewed. Results shared with Erika Crawford, using Layman's terms.                         Meds   Current Outpatient Medications:  .  acetaminophen (TYLENOL) 500 MG tablet, Take 500 mg by mouth every 6 (six) hours as needed., Disp: , Rfl:  .  alendronate (FOSAMAX) 70 MG tablet, Take 70 mg by mouth once a week. Take with a full glass of water on an empty stomach., Disp: , Rfl:  .  amLODipine (NORVASC) 5 MG tablet, Take 50 mg by mouth daily., Disp: , Rfl: 3 .  amLODipine-valsartan (EXFORGE) 5-320 MG tablet, Take 1 tablet by mouth daily., Disp: , Rfl:  .  B Complex Vitamins (VITAMIN B COMPLEX PO), Take 1 tablet by mouth daily., Disp: , Rfl:  .  Calcium Carbonate-Vitamin D (CALTRATE 600+D) 600-400 MG-UNIT tablet, Take 1 tablet by mouth 2 (two) times daily., Disp: , Rfl:  .  cyclobenzaprine (FLEXERIL) 5 MG tablet, Take 1-2 tablets by mouth 3 (three) times daily., Disp: , Rfl:  .  diclofenac (VOLTAREN) 50 MG EC tablet, Take 50 mg by mouth 2 (two) times daily., Disp: , Rfl:  .  levothyroxine (SYNTHROID, LEVOTHROID) 100 MCG tablet, Take 100 mcg by mouth daily before breakfast., Disp: , Rfl:  .  mometasone (NASONEX) 50 MCG/ACT nasal spray, Place 2 sprays into the nose daily., Disp: , Rfl:  .  omeprazole (PRILOSEC) 20 MG capsule, Take 20 mg by mouth daily., Disp: , Rfl:  .  senna-docusate (SENOKOT-S) 8.6-50 MG tablet, Take 1 tablet by mouth 3 times/day as needed-between meals & bedtime., Disp: , Rfl:  .  VITAMIN D, ERGOCALCIFEROL, PO, Take by mouth 2 (two) times daily., Disp: , Rfl:  .  diclofenac sodium (VOLTAREN) 1 % GEL, Apply topically 3 (three) times daily., Disp: , Rfl:  .  olmesartan (BENICAR) 40 MG tablet, Take 40 mg by mouth daily., Disp: , Rfl:  .  tapentadol (NUCYNTA) 50 MG tablet, Take 1 tablet (50 mg total) by mouth every 4 (four) hours as needed for moderate pain. (Patient not taking: Reported on 08/12/2017), Disp: 30 tablet, Rfl: 0 .  valsartan (DIOVAN) 320 MG tablet, Take 320 mg by mouth daily., Disp: , Rfl:   ROS  Constitutional: Denies any fever or chills Gastrointestinal: No reported hemesis, hematochezia, vomiting, or acute GI distress Musculoskeletal: Denies any acute onset joint swelling, redness, loss of ROM, or weakness Neurological: No reported episodes of acute onset apraxia, aphasia, dysarthria, agnosia, amnesia, paralysis, loss of coordination, or loss of consciousness  Allergies  Erika Crawford is allergic to celebrex [celecoxib]; codeine; etodolac; hydrochlorothiazide; oxycodone; pravastatin; relafen [nabumetone]; and tramadol.  PFSH  Drug: Erika Crawford  reports that she does not use drugs. Alcohol:  reports that she drinks alcohol. Tobacco:  reports that  has never smoked. she has never used smokeless tobacco. Medical:  has a past medical history of Arthritis, Complication of anesthesia, Hypertension, Hypothyroidism, Sleep apnea, and Thyroid disease. Surgical: Erika Crawford  has a past surgical history that includes Abdominal hysterectomy; Colonoscopy with propofol (N/A, 09/21/2015); Back surgery; Total knee arthroplasty (Left, 03/21/2016); and Joint replacement. Family: family history is not on file.  Constitutional Exam  General appearance: Well nourished, well developed, and well hydrated. In no apparent acute distress Vitals:   08/12/17 1326  BP: 119/77  Pulse: 83  Resp: 16  Temp: 98.1 F (36.7 C)  TempSrc: Oral  SpO2: 100%  Weight: 215 lb (97.5 kg)  Height: '5\' 7"'  (1.702 m)   BMI Assessment: Estimated body mass index is 33.67 kg/m as calculated from the following:   Height as of this encounter: '5\' 7"'  (1.702 m).   Weight as of this encounter: 215 lb (97.5 kg).  BMI interpretation table: BMI level Category Range association with higher incidence of chronic pain  <18 kg/m2 Underweight   18.5-24.9 kg/m2 Ideal body weight   25-29.9 kg/m2 Overweight Increased incidence by 20%  30-34.9 kg/m2 Obese  (Class I) Increased incidence by 68%  35-39.9 kg/m2 Severe obesity (Class II) Increased incidence by 136%  >40 kg/m2 Extreme obesity (Class III) Increased incidence by 254%   BMI Readings from Last 4 Encounters:  08/12/17 33.67 kg/m  07/14/17 33.20 kg/m  05/22/17 32.89 kg/m  04/29/17 33.20 kg/m   Wt Readings from Last 4 Encounters:  08/12/17 215 lb (97.5 kg)  07/14/17 212 lb (96.2 kg)  05/22/17 210 lb (95.3 kg)  04/29/17 212 lb (96.2 kg)  Psych/Mental status: Alert, oriented x 3 (person, place, & time)       Eyes: PERLA Respiratory: No evidence of acute respiratory distress  Cervical Spine Area Exam  Skin & Axial Inspection: No masses, redness, edema, swelling, or associated skin lesions Alignment: Symmetrical Functional ROM: Unrestricted ROM      Stability: No instability detected Muscle Tone/Strength: Functionally intact. No obvious neuro-muscular anomalies detected. Sensory (Neurological): Unimpaired Palpation: No palpable anomalies              Upper Extremity (UE) Exam    Side: Right upper extremity  Side: Left upper extremity  Skin & Extremity Inspection: Skin color, temperature, and hair growth are WNL. No peripheral edema or cyanosis. No masses, redness, swelling, asymmetry, or associated skin lesions. No contractures.  Skin & Extremity Inspection: Skin color, temperature, and hair growth are WNL. No peripheral edema or cyanosis. No masses, redness, swelling, asymmetry, or associated skin lesions. No contractures.  Functional ROM: Unrestricted ROM          Functional ROM: Unrestricted ROM          Muscle Tone/Strength: Functionally intact. No obvious neuro-muscular anomalies detected.  Muscle Tone/Strength: Functionally intact. No obvious neuro-muscular  anomalies detected.  Sensory (Neurological): Unimpaired          Sensory (Neurological): Unimpaired          Palpation: No palpable anomalies              Palpation: No palpable anomalies              Specialized  Test(s): Deferred         Specialized Test(s): Deferred          Thoracic Spine Area Exam  Skin & Axial Inspection: No masses, redness, or swelling Alignment: Symmetrical Functional ROM: Unrestricted ROM Stability: No instability detected Muscle Tone/Strength: Functionally intact. No obvious neuro-muscular anomalies detected. Sensory (Neurological): Unimpaired Muscle strength & Tone: No palpable anomalies  Lumbar Spine Area Exam  Skin & Axial Inspection: No masses, redness, or swelling Alignment: Symmetrical Functional ROM: Improved after treatment      Stability: No instability detected Muscle Tone/Strength: Functionally intact. No obvious neuro-muscular anomalies detected. Sensory (Neurological): Unimpaired Palpation: No palpable anomalies       Provocative Tests: Lumbar Hyperextension and rotation test: Improved after treatment       Lumbar Lateral bending test: Improved after treatment       Patrick's Maneuver: evaluation deferred today                    Gait & Posture Assessment  Ambulation: Unassisted Gait: Relatively normal for age and body habitus Posture: WNL   Lower Extremity Exam    Side: Right lower extremity  Side: Left lower extremity  Skin & Extremity Inspection: Skin color, temperature, and hair growth are WNL. No peripheral edema or cyanosis. No masses, redness, swelling, asymmetry, or associated skin lesions. No contractures.  Skin & Extremity Inspection: Skin color, temperature, and hair growth are WNL. No peripheral edema or cyanosis. No masses, redness, swelling, asymmetry, or associated skin lesions. No contractures.  Functional ROM: Unrestricted ROM          Functional ROM: Unrestricted ROM          Muscle Tone/Strength: Functionally intact. No obvious neuro-muscular anomalies detected.  Muscle Tone/Strength: Functionally intact. No obvious neuro-muscular anomalies detected.  Sensory (Neurological): Unimpaired  Sensory (Neurological): Unimpaired  Palpation:  No palpable anomalies  Palpation: No palpable anomalies   Assessment  Primary Diagnosis & Pertinent Problem List: The primary encounter diagnosis was Lumbar radiculopathy. Diagnoses of Lumbar degenerative disc disease, Lumbar foraminal stenosis, and Chronic left-sided low back pain with left-sided sciatica were also pertinent to this visit.  Status Diagnosis  Improving Controlled Controlled 1. Lumbar radiculopathy   2. Lumbar degenerative disc disease   3. Lumbar foraminal stenosis   4. Chronic left-sided low back pain with left-sided sciatica     General Recommendations: The pain condition that the patient suffers from is best treated with a multidisciplinary approach that involves an increase in physical activity to prevent de-conditioning and worsening of the pain cycle, as well as psychological counseling (formal and/or informal) to address the co-morbid psychological affects of pain. Treatment will often involve judicious use of pain medications and interventional procedures to decrease the pain, allowing the patient to participate in the physical activity that will ultimately produce long-lasting pain reductions. The goal of the multidisciplinary approach is to return the patient to a higher level of overall function and to restore their ability to perform activities of daily living.   77 year old female with a history of lumbosacral radiculopathy status post L5-S1 epidural steroid injection performed on 07/14/2017 who  presents for follow-up.  Patient endorses greater than 75% pain relief of her axial low back and bilateral leg symptoms after the epidural.  She states that she is able to perform activities of daily living with greater ease is able to walk an extended period of time with less pain.  She is continuing to endorse ongoing benefit from the epidural steroid injection.  Patient will follow-up in 3 months.   Provider-requested follow-up: Return in about 3 months (around  11/09/2017). Time Note: Greater than 50% of the 25 minute(s) of face-to-face time spent with Erika Crawford, was spent in counseling/coordination of care regarding: the treatment plan, treatment alternatives, the results, interpretation and significance of  her recent diagnostic interventional treatment(s), realistic expectations and the goals of pain management (increased in functionality). Future Appointments  Date Time Provider [place]  11/04/2017  1:30 PM [name], MD [facility] None    Primary Care Physician: Glendon Axe, MD Location: [facility] Note by: [name], M.D Date: 08/12/2017; Time: 2:28 PM  There are no Patient Instructions on file for this visit.

## 2017-08-12 NOTE — Progress Notes (Signed)
Safety precautions to be maintained throughout the outpatient stay will include: orient to surroundings, keep bed in low position, maintain call bell within reach at all times, provide assistance with transfer out of bed and ambulation.  

## 2017-09-06 DIAGNOSIS — D649 Anemia, unspecified: Secondary | ICD-10-CM | POA: Insufficient documentation

## 2017-09-10 ENCOUNTER — Other Ambulatory Visit: Payer: Self-pay | Admitting: Student in an Organized Health Care Education/Training Program

## 2017-09-10 DIAGNOSIS — M5416 Radiculopathy, lumbar region: Secondary | ICD-10-CM

## 2017-09-10 NOTE — Progress Notes (Signed)
Patient called and requesting repeat L-ESI given return of radicular pain affecting her lower extremities. Order placed.  Orders Placed This Encounter  Procedures  . Lumbar Epidural Injection    Standing Status:   Future    Standing Expiration Date:   10/10/2017    Scheduling Instructions:     Side: Midline     Sedation: With Sedation.     Timeframe: in 2 weeks    Order Specific Question:   Where will this procedure be performed?    Answer:   [facility]

## 2017-09-22 ENCOUNTER — Ambulatory Visit (HOSPITAL_BASED_OUTPATIENT_CLINIC_OR_DEPARTMENT_OTHER): Payer: 59 | Admitting: Student in an Organized Health Care Education/Training Program

## 2017-09-22 ENCOUNTER — Other Ambulatory Visit: Payer: Self-pay

## 2017-09-22 ENCOUNTER — Encounter: Payer: Self-pay | Admitting: Student in an Organized Health Care Education/Training Program

## 2017-09-22 ENCOUNTER — Ambulatory Visit
Admission: RE | Admit: 2017-09-22 | Discharge: 2017-09-22 | Disposition: A | Discharge status: Home / Self Care | Payer: 59 | Source: Ambulatory Visit | Attending: Student in an Organized Health Care Education/Training Program | Admitting: Student in an Organized Health Care Education/Training Program

## 2017-09-22 DIAGNOSIS — M5416 Radiculopathy, lumbar region: Secondary | ICD-10-CM

## 2017-09-22 DIAGNOSIS — Z886 Allergy status to analgesic agent status: Secondary | ICD-10-CM | POA: Diagnosis not present

## 2017-09-22 DIAGNOSIS — Z888 Allergy status to other drugs, medicaments and biological substances status: Secondary | ICD-10-CM | POA: Insufficient documentation

## 2017-09-22 DIAGNOSIS — Z885 Allergy status to narcotic agent status: Secondary | ICD-10-CM | POA: Insufficient documentation

## 2017-09-22 MED ORDER — IOPAMIDOL (ISOVUE-M 200) INJECTION 41%
10.0000 mL | Freq: Once | INTRAMUSCULAR | Status: AC
  Administered 2017-09-22: 10 mL via EPIDURAL
  Filled 2017-09-22: qty 10

## 2017-09-22 MED ORDER — LIDOCAINE HCL (PF) 1 % IJ SOLN
4.5000 mL | Freq: Once | INTRAMUSCULAR | Status: AC
  Administered 2017-09-22: 4.5 mL
  Filled 2017-09-22: qty 5

## 2017-09-22 MED ORDER — SODIUM CHLORIDE 0.9% FLUSH
2.0000 mL | Freq: Once | INTRAVENOUS | Status: AC
  Administered 2017-09-22: 2 mL

## 2017-09-22 MED ORDER — DEXAMETHASONE SODIUM PHOSPHATE 10 MG/ML IJ SOLN
10.0000 mg | Freq: Once | INTRAMUSCULAR | Status: AC
  Administered 2017-09-22: 10 mg
  Filled 2017-09-22: qty 1

## 2017-09-22 MED ORDER — ROPIVACAINE HCL 2 MG/ML IJ SOLN
2.0000 mL | Freq: Once | INTRAMUSCULAR | Status: AC
  Administered 2017-09-22: 2 mL via EPIDURAL
  Filled 2017-09-22: qty 10

## 2017-09-22 NOTE — Progress Notes (Signed)
Patient's Name: Erika Crawford  MRN: [id number]  Referring Provider: [name], MD  DOB: 1940-12-08  PCP: Glendon Axe, MD  DOS: 09/22/2017  Note by: [name], MD  Service setting: Ambulatory outpatient  Specialty: Interventional Pain Management  Patient type: Established  Location: [facility] (AMB) Pain Management Facility  Visit type: Interventional Procedure   Primary Reason for Visit: Interventional Pain Management Treatment. CC: Back Pain (right, lower)  Procedure:       Anesthesia, Analgesia, Anxiolysis:  Type: Therapeutic Inter-Laminar Epidural Steroid Injection #3 (#2 on 07/14/17, #1 on 04/14/18) Region: Lumbar Level: L5-S1 Level. Laterality: Right-Sided         Type: Local Anesthesia Indication(s): Analgesia and Anxiety Route: Infiltration ([place]/IM) IV Access: Declined Sedation: Declined  Local Anesthetic: Lidocaine 1-2%   Indications: 1. Lumbar radiculopathy    Pain Score: Pre-procedure: 5 /10 Post-procedure: 0-No pain/10  Pre-op Assessment:  Erika Crawford is a 77 y.o. (year old), female patient, seen today for interventional treatment. 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 Erika Crawford has a current medication list which includes the following prescription(s): acetaminophen, alendronate, amlodipine, b complex vitamins, calcium carbonate-vitamin d, levothyroxine, mometasone, omeprazole, senna-docusate, ergocalciferol, amlodipine-valsartan, cyclobenzaprine, diclofenac, diclofenac sodium, olmesartan, tapentadol, and valsartan. Her primarily concern today is the Back Pain (right, lower)  Initial Vital Signs:  Pulse Rate: 80 Temp: 98.4 F (36.9 C) Resp: 16 BP: 125/66 SpO2: 100 %  BMI: Estimated body mass index is 41.5 kg/m as calculated from the following:   Height as of this encounter: 5\' 7"  (1.702 m).   Weight as of  this encounter: 265 lb (120.2 kg).  Risk Assessment: Allergies: Reviewed. She is allergic to celebrex [celecoxib]; codeine; etodolac; hydrochlorothiazide; oxycodone; pravastatin; relafen [nabumetone]; and tramadol.  Allergy Precautions: None required Coagulopathies: Reviewed. None identified.  Blood-thinner therapy: None at this time Active Infection(s): Reviewed. None identified. Erika Crawford is afebrile  Site Confirmation: Erika Crawford was asked to confirm the procedure and laterality before marking the site Procedure checklist: Completed Consent: Before the procedure and under the influence of no sedative(s), amnesic(s), or anxiolytics, the patient was informed of the treatment options, risks and possible complications. To fulfill our ethical and legal obligations, as recommended by the American Medical Association's Code of Ethics, I have informed the patient of my clinical impression; the nature and purpose of the treatment or procedure; the risks, benefits, and possible complications of the intervention; the alternatives, including doing nothing; the risk(s) and benefit(s) of the alternative treatment(s) or procedure(s); and the risk(s) and benefit(s) of doing nothing. The patient was provided information about the general risks and possible complications associated with the procedure. These may include, but are not limited to: failure to achieve desired goals, infection, bleeding, organ or nerve damage, allergic reactions, paralysis, and death. In addition, the patient was informed of those risks and complications associated to Spine-related procedures, such as failure to decrease pain; infection (i.e.: Meningitis, epidural or intraspinal abscess); bleeding (i.e.: epidural hematoma, subarachnoid hemorrhage, or any other type of intraspinal or peri-dural bleeding); organ or nerve damage (i.e.: Any type of peripheral nerve, nerve root, or spinal cord injury) with subsequent damage to sensory, motor,  and/or autonomic systems, resulting in permanent pain, numbness, and/or weakness of one or several areas of the body; allergic reactions; (i.e.: anaphylactic reaction); and/or death. Furthermore, the patient was informed of those risks and complications associated with the medications. These  include, but are not limited to: allergic reactions (i.e.: anaphylactic or anaphylactoid reaction(s)); adrenal axis suppression; blood sugar elevation that in diabetics may result in ketoacidosis or comma; water retention that in patients with history of congestive heart failure may result in shortness of breath, pulmonary edema, and decompensation with resultant heart failure; weight gain; swelling or edema; medication-induced neural toxicity; particulate matter embolism and blood vessel occlusion with resultant organ, and/or nervous system infarction; and/or aseptic necrosis of one or more joints. Finally, the patient was informed that Medicine is not an exact science; therefore, there is also the possibility of unforeseen or unpredictable risks and/or possible complications that may result in a catastrophic outcome. The patient indicated having understood very clearly. We have given the patient no guarantees and we have made no promises. Enough time was given to the patient to ask questions, all of which were answered to the patient's satisfaction. Erika Crawford has indicated that she wanted to continue with the procedure. Attestation: I, the ordering provider, attest that I have discussed with the patient the benefits, risks, side-effects, alternatives, likelihood of achieving goals, and potential problems during recovery for the procedure that I have provided informed consent. Date  Time: 09/22/2017  8:55 AM  Pre-Procedure Preparation:  Monitoring: As per clinic protocol. Respiration, ETCO2, SpO2, BP, heart rate and rhythm monitor placed and checked for adequate function Safety Precautions: Patient was assessed for  positional comfort and pressure points before starting the procedure. Time-out: I initiated and conducted the "Time-out" before starting the procedure, as per protocol. The patient was asked to participate by confirming the accuracy of the "Time Out" information. Verification of the correct person, site, and procedure were performed and confirmed by me, the nursing staff, and the patient. "Time-out" conducted as per Joint Commission's Universal Protocol (UP.01.01.01). Time: 0918  Description of Procedure:       Position: Prone with head of the table was raised to facilitate breathing. Target Area: The interlaminar space, initially targeting the lower laminar border of the superior vertebral body. Approach: Paramedial approach. Area Prepped: Entire Posterior Lumbar Region Prepping solution: ChloraPrep (2% chlorhexidine gluconate and 70% isopropyl alcohol) Safety Precautions: Aspiration looking for blood return was conducted prior to all injections. At no point did we inject any substances, as a needle was being advanced. No attempts were made at seeking any paresthesias. Safe injection practices and needle disposal techniques used. Medications properly checked for expiration dates. SDV (single dose vial) medications used. Description of the Procedure: Protocol guidelines were followed. The procedure needle was introduced through the skin, ipsilateral to the reported pain, and advanced to the target area. Bone was contacted and the needle walked caudad, until the lamina was cleared. The epidural space was identified using "loss-of-resistance technique" with 2-3 ml of PF-NaCl (0.9% NSS), in a 5cc LOR glass syringe. Vitals:   09/22/17 0918 09/22/17 0923 09/22/17 0928 09/22/17 0938  BP: (!) 146/73 (!) 155/82 (!) 151/72 (!) 146/73  Pulse: 84 88 80 81  Resp: 12 15 13 12   Temp:      TempSrc:      SpO2: 98% 98% 98% 99%  Weight:      Height:        Start Time: 0918 hrs. End Time: 0931 hrs. Materials:   Needle(s) Type: Epidural needle Gauge: 17G Length: 3.5-in Medication(s): Please see orders for medications and dosing details. 8 CC solution made of 5 cc of preservative-free saline, 2 cc of 0.2% ropivacaine, 1 cc of Decadron 10 mill grams per  cc Imaging Guidance (Spinal):  Type of Imaging Technique: Fluoroscopy Guidance (Spinal) Indication(s): Assistance in needle guidance and placement for procedures requiring needle placement in or near specific anatomical locations not easily accessible without such assistance. Exposure Time: Please see nurses notes. Contrast: Before injecting any contrast, we confirmed that the patient did not have an allergy to iodine, shellfish, or radiological contrast. Once satisfactory needle placement was completed at the desired level, radiological contrast was injected. Contrast injected under live fluoroscopy. No contrast complications. See chart for type and volume of contrast used. Fluoroscopic Guidance: I was personally present during the use of fluoroscopy. "Tunnel Vision Technique" used to obtain the best possible view of the target area. Parallax error corrected before commencing the procedure. "Direction-depth-direction" technique used to introduce the needle under continuous pulsed fluoroscopy. Once target was reached, antero-posterior, oblique, and lateral fluoroscopic projection used confirm needle placement in all planes. Images permanently stored in EMR. Interpretation: I personally interpreted the imaging intraoperatively. Adequate needle placement confirmed in multiple planes. Appropriate spread of contrast into desired area was observed. No evidence of afferent or efferent intravascular uptake. No intrathecal or subarachnoid spread observed. Permanent images saved into the patient's record.  Antibiotic Prophylaxis:   Anti-infectives (From admission, onward)   None     Indication(s): None identified  Post-operative Assessment:  Post-procedure Vital  Signs:  Pulse Rate: 81 Temp: 98.4 F (36.9 C) Resp: 12 BP: (!) 146/73 SpO2: 99 %  EBL: None  Complications: No immediate post-treatment complications observed by team, or reported by patient.  Note: The patient tolerated the entire procedure well. A repeat set of vitals were taken after the procedure and the patient was kept under observation following institutional policy, for this type of procedure. Post-procedural neurological assessment was performed, showing return to baseline, prior to discharge. The patient was provided with post-procedure discharge instructions, including a section on how to identify potential problems. Should any problems arise concerning this procedure, the patient was given instructions to immediately contact us, at any time, without hesitation. In any case, we plan to contact the patient by telephone for a follow-up status report regarding this interventional procedure.  Comments:  No additional relevant information.  5 out of 5 strength bilateral lower extremity: Plantar flexion, dorsiflexion, knee flexion, knee extension.  Plan of Care    Imaging Orders     DG C-Arm 1-60 Min-No Report Procedure Orders    No procedure(s) ordered today    Medications ordered for procedure: Meds ordered this encounter  Medications  . iopamidol (ISOVUE-M) 41 % intrathecal injection 10 mL  . ropivacaine (PF) 2 mg/mL (0.2%) (NAROPIN) injection 2 mL  . sodium chloride flush (NS) 0.9 % injection 2 mL  . dexamethasone (DECADRON) injection 10 mg  . lidocaine (PF) (XYLOCAINE) 1 % injection 4.5 mL   Medications administered: We administered iopamidol, ropivacaine (PF) 2 mg/mL (0.2%), sodium chloride flush, dexamethasone, and lidocaine (PF).  See the medical record for exact dosing, route, and time of administration.  New Prescriptions   No medications on file   Disposition: Discharge home  Discharge Date & Time: 09/22/2017; 0939 hrs.   Physician-requested Follow-up:  Return in about 1 month (around 10/20/2017) for Post Procedure Evaluation.  Future Appointments  Date Time Provider [place]  10/20/2017  1:45 PM [name], MD [facility] None  11/04/2017  1:30 PM [name], MD [facility] None   Primary Care Physician: Glendon Axe, MD Location: [facility] Note by: [name], MD Date: 09/22/2017; Time:  10:30 AM  Disclaimer:  Medicine is not an Chief Strategy Officer. The only guarantee in medicine is that nothing is guaranteed. It is important to note that the decision to proceed with this intervention was based on the information collected from the patient. The Data and conclusions were drawn from the patient's questionnaire, the interview, and the physical examination. Because the information was provided in large part by the patient, it cannot be guaranteed that it has not been purposely or unconsciously manipulated. Every effort has been made to obtain as much relevant data as possible for this evaluation. It is important to note that the conclusions that lead to this procedure are derived in large part from the available data. Always take into account that the treatment will also be dependent on availability of resources and existing treatment guidelines, considered by other Pain Management Practitioners as being common knowledge and practice, at the time of the intervention. For Medico-Legal purposes, it is also important to point out that variation in procedural techniques and pharmacological choices are the acceptable norm. The indications, contraindications, technique, and results of the above procedure should only be interpreted and judged by a Board-Certified Interventional Pain Specialist with extensive familiarity and expertise in the same exact procedure and technique.

## 2017-09-22 NOTE — Progress Notes (Signed)
Safety precautions to be maintained throughout the outpatient stay will include: orient to surroundings, keep bed in low position, maintain call bell within reach at all times, provide assistance with transfer out of bed and ambulation.  

## 2017-09-22 NOTE — Patient Instructions (Signed)

## 2017-10-20 ENCOUNTER — Ambulatory Visit: Payer: 59 | Admitting: Student in an Organized Health Care Education/Training Program

## 2017-10-22 ENCOUNTER — Encounter: Payer: Self-pay | Admitting: Student in an Organized Health Care Education/Training Program

## 2017-10-22 ENCOUNTER — Other Ambulatory Visit: Payer: Self-pay

## 2017-10-22 ENCOUNTER — Ambulatory Visit
Payer: 59 | Attending: Student in an Organized Health Care Education/Training Program | Admitting: Student in an Organized Health Care Education/Training Program

## 2017-10-22 VITALS — BP 111/73 | HR 87 | Temp 98.2°F | Resp 18 | Ht 67.0 in | Wt 206.0 lb

## 2017-10-22 DIAGNOSIS — M5416 Radiculopathy, lumbar region: Secondary | ICD-10-CM

## 2017-10-22 DIAGNOSIS — M19011 Primary osteoarthritis, right shoulder: Secondary | ICD-10-CM | POA: Diagnosis not present

## 2017-10-22 DIAGNOSIS — Z79899 Other long term (current) drug therapy: Secondary | ICD-10-CM | POA: Insufficient documentation

## 2017-10-22 DIAGNOSIS — G8929 Other chronic pain: Secondary | ICD-10-CM | POA: Diagnosis not present

## 2017-10-22 DIAGNOSIS — I10 Essential (primary) hypertension: Secondary | ICD-10-CM | POA: Insufficient documentation

## 2017-10-22 DIAGNOSIS — Z886 Allergy status to analgesic agent status: Secondary | ICD-10-CM | POA: Diagnosis not present

## 2017-10-22 DIAGNOSIS — Z885 Allergy status to narcotic agent status: Secondary | ICD-10-CM | POA: Diagnosis not present

## 2017-10-22 DIAGNOSIS — M9983 Other biomechanical lesions of lumbar region: Secondary | ICD-10-CM

## 2017-10-22 DIAGNOSIS — Z96652 Presence of left artificial knee joint: Secondary | ICD-10-CM | POA: Diagnosis not present

## 2017-10-22 DIAGNOSIS — M48061 Spinal stenosis, lumbar region without neurogenic claudication: Secondary | ICD-10-CM | POA: Diagnosis not present

## 2017-10-22 DIAGNOSIS — G4730 Sleep apnea, unspecified: Secondary | ICD-10-CM | POA: Insufficient documentation

## 2017-10-22 DIAGNOSIS — M5442 Lumbago with sciatica, left side: Secondary | ICD-10-CM | POA: Diagnosis not present

## 2017-10-22 DIAGNOSIS — M5136 Other intervertebral disc degeneration, lumbar region: Secondary | ICD-10-CM

## 2017-10-22 DIAGNOSIS — M5116 Intervertebral disc disorders with radiculopathy, lumbar region: Secondary | ICD-10-CM | POA: Insufficient documentation

## 2017-10-22 DIAGNOSIS — Z888 Allergy status to other drugs, medicaments and biological substances status: Secondary | ICD-10-CM | POA: Diagnosis not present

## 2017-10-22 DIAGNOSIS — E039 Hypothyroidism, unspecified: Secondary | ICD-10-CM | POA: Diagnosis not present

## 2017-10-22 NOTE — Progress Notes (Signed)
Patient's Name: Erika Crawford  MRN: [id number]  Referring Provider: Glendon Axe, MD  DOB: [birth year]  PCP: Glendon Axe, MD  DOS: 10/22/2017  Note by: [name], MD  Service setting: Ambulatory outpatient  Specialty: Interventional Pain Management  Location: [facility] (AMB) Pain Management Facility    Patient type: Established   Primary Reason(s) for Visit: Encounter for post-procedure evaluation of chronic illness with mild to moderate exacerbation CC: Back Pain (right)  HPI  [name] is a 77 y.o. year old, female patient, who comes today for a post-procedure evaluation. She has Status post total knee replacement using cement, left; Rotator cuff tendinitis, right; Rotator cuff tendinitis, left; Primary osteoarthritis of right shoulder; Osteoarthritis; Obstructive sleep apnea on CPAP; Injury of tendon of long head of right biceps; Incomplete tear of right rotator cuff; Disorder of uterus; Bilateral hydronephrosis; and Lumbar radiculopathy on their problem list. Her primarily concern today is the Back Pain (right)  Pain Assessment: Location: Right, Lower Back Radiating: denies Onset: More than a month ago Duration: Chronic pain Quality: Sharp Severity: 0-No pain/10 (subjective, self-reported pain score)  Note: Reported level is compatible with observation.                         When using our objective Pain Scale, levels between 6 and 10/10 are said to belong in an emergency room, as it progressively worsens from a 6/10, described as severely limiting, requiring emergency care not usually available at an outpatient pain management facility. At a 6/10 level, communication becomes difficult and requires great effort. Assistance to reach the emergency department may be required. Facial flushing and profuse sweating along with potentially dangerous increases in heart rate and blood pressure will be evident. Effect on ADL:   Timing: Constant Modifying factors: procedure BP: 111/73  HR:  87  [name] comes in today for post-procedure evaluation after the treatment done on 09/22/2017.  Further details on both, my assessment(s), as well as the proposed treatment plan, please see below.  Post-Procedure Assessment  09/22/2017 Procedure: Right L5-S1 ESI #3; (#2 on 07/14/17, #1 on 04/14/18) Pre-procedure pain score:  5/10 Post-procedure pain score: 0/10         Influential Factors: BMI: 32.26 kg/m Intra-procedural challenges: None observed.         Assessment challenges: None detected.              Reported side-effects: None.        Post-procedural adverse reactions or complications: None reported         Sedation: Please see nurses note. When no sedatives are used, the analgesic levels obtained are directly associated to the effectiveness of the local anesthetics. However, when sedation is provided, the level of analgesia obtained during the initial 1 hour following the intervention, is believed to be the result of a combination of factors. These factors may include, but are not limited to: 1. The effectiveness of the local anesthetics used. 2. The effects of the analgesic(s) and/or anxiolytic(s) used. 3. The degree of discomfort experienced by the patient at the time of the procedure. 4. The patients ability and reliability in recalling and recording the events. 5. The presence and influence of possible secondary gains and/or psychosocial factors. Reported result: Relief experienced during the 1st hour after the procedure: 100 % (Ultra-Short Term Relief)            Interpretative annotation: Clinically appropriate result. Analgesia during this period is likely to be Local  Anesthetic and/or IV Sedative (Analgesic/Anxiolytic) related.          Effects of local anesthetic: The analgesic effects attained during this period are directly associated to the localized infiltration of local anesthetics and therefore cary significant diagnostic value as to the etiological location, or  anatomical origin, of the pain. Expected duration of relief is directly dependent on the pharmacodynamics of the local anesthetic used. Long-acting (4-6 hours) anesthetics used.  Reported result: Relief during the next 4 to 6 hour after the procedure: 100 % (Short-Term Relief)            Interpretative annotation: Clinically appropriate result. Analgesia during this period is likely to be Local Anesthetic-related.          Long-term benefit: Defined as the period of time past the expected duration of local anesthetics (1 hour for short-acting and 4-6 hours for long-acting). With the possible exception of prolonged sympathetic blockade from the local anesthetics, benefits during this period are typically attributed to, or associated with, other factors such as analgesic sensory neuropraxia, antiinflammatory effects, or beneficial biochemical changes provided by agents other than the local anesthetics.  Reported result: Extended relief following procedure: 100 % (Long-Term Relief)            Interpretative annotation: Clinically appropriate result. Complete relief. Therapeutic success. Inflammation plays a part in the etiology to the pain.          Current benefits: Defined as reported results that persistent at this point in time.   Analgesia: 75-100 %            Function: [name] reports improvement in function ROM: [name] reports improvement in ROM Interpretative annotation: Complete relief. Therapeutic success. Effective diagnostic intervention.          Interpretation: Results would suggest a successful diagnostic and therapeutic intervention.                  Plan:  Set up procedure as a PRN palliative treatment option for this patient.                Laboratory Chemistry  Inflammation Markers (CRP: Acute Phase) (ESR: Chronic Phase) No results found for: CRP, ESRSEDRATE, LATICACIDVEN                       Rheumatology Markers No results found for: RF, ANA, [name], [facility]                      Renal Function Markers Lab Results  Component Value Date   BUN 13 04/15/2016   CREATININE 0.64 04/15/2016   GFRAA >60 04/15/2016   GFRNONAA >60 04/15/2016                              Hepatic Function Markers Lab Results  Component Value Date   AST 16 01/19/2014   ALT 25 01/19/2014   ALBUMIN 3.6 01/19/2014   ALKPHOS 62 01/19/2014                        Electrolytes Lab Results  Component Value Date   NA 136 04/15/2016   K 3.3 (L) 04/15/2016   CL 101 04/15/2016   CALCIUM 9.5 04/15/2016                        Neuropathy Markers No  results found for: VITAMINB12, FOLATE, HGBA1C, HIV                      Bone Pathology Markers No results found for: VD25OH, [id number], [id number], [id number], 25OHVITD1, 25OHVITD2, 25OHVITD3, TESTOFREE, TESTOSTERONE                       Coagulation Parameters Lab Results  Component Value Date   INR 1.06 04/15/2016   LABPROT 13.8 04/15/2016   APTT 30 04/15/2016   PLT 264 04/15/2016                        Cardiovascular Markers Lab Results  Component Value Date   TROPONINI <0.03 11/01/2014   HGB 12.3 04/15/2016   HCT 37.6 04/15/2016                         CA Markers No results found for: CEA, CA125, LABCA2                      Note: Lab results reviewed.  Recent Diagnostic Imaging Results  DG C-Arm 1-60 Min-No Report Fluoroscopy was utilized by the requesting physician.  No radiographic  interpretation.   Complexity Note: Imaging results reviewed. Results shared with Ms. Lame, using Layman's terms.                         Meds   Current Outpatient Medications:  .  acetaminophen (TYLENOL) 500 MG tablet, Take 500 mg by mouth every 6 (six) hours as needed., Disp: , Rfl:  .  alendronate (FOSAMAX) 70 MG tablet, Take 70 mg by mouth once a week. Take with a full glass of water on an empty stomach., Disp: , Rfl:  .  amLODipine (NORVASC) 5 MG tablet, Take 50 mg by mouth daily.,  Disp: , Rfl: 3 .  B Complex Vitamins (VITAMIN B COMPLEX PO), Take 1 tablet by mouth daily., Disp: , Rfl:  .  Calcium Carbonate-Vitamin D (CALTRATE 600+D) 600-400 MG-UNIT tablet, Take 1 tablet by mouth 2 (two) times daily., Disp: , Rfl:  .  diclofenac (VOLTAREN) 50 MG EC tablet, Take 50 mg by mouth 2 (two) times daily., Disp: , Rfl:  .  levothyroxine (SYNTHROID, LEVOTHROID) 100 MCG tablet, Take 100 mcg by mouth daily before breakfast., Disp: , Rfl:  .  mometasone (NASONEX) 50 MCG/ACT nasal spray, Place 2 sprays into the nose daily., Disp: , Rfl:  .  omeprazole (PRILOSEC) 20 MG capsule, Take 20 mg by mouth daily., Disp: , Rfl:  .  senna-docusate (SENOKOT-S) 8.6-50 MG tablet, Take 1 tablet by mouth 3 times/day as needed-between meals & bedtime., Disp: , Rfl:  .  VITAMIN D, ERGOCALCIFEROL, PO, Take by mouth 2 (two) times daily., Disp: , Rfl:  .  amLODipine-valsartan (EXFORGE) 5-320 MG tablet, Take 1 tablet by mouth daily., Disp: , Rfl:  .  cyclobenzaprine (FLEXERIL) 5 MG tablet, Take 1-2 tablets by mouth 3 (three) times daily., Disp: , Rfl:  .  diclofenac sodium (VOLTAREN) 1 % GEL, Apply topically 3 (three) times daily., Disp: , Rfl:  .  olmesartan (BENICAR) 40 MG tablet, Take 40 mg by mouth daily., Disp: , Rfl:  .  tapentadol (NUCYNTA) 50 MG tablet, Take 1 tablet (50 mg total) by mouth every 4 (four) hours as needed for moderate pain. (Patient not taking: Reported on 08/12/2017), Disp: 30  tablet, Rfl: 0 .  valsartan (DIOVAN) 320 MG tablet, Take 320 mg by mouth daily., Disp: , Rfl:   ROS  Constitutional: Denies any fever or chills Gastrointestinal: No reported hemesis, hematochezia, vomiting, or acute GI distress Musculoskeletal: Denies any acute onset joint swelling, redness, loss of ROM, or weakness Neurological: No reported episodes of acute onset apraxia, aphasia, dysarthria, agnosia, amnesia, paralysis, loss of coordination, or loss of consciousness  Allergies  Ms. Treiber is allergic to  celebrex [celecoxib]; codeine; etodolac; hydrochlorothiazide; oxycodone; pravastatin; relafen [nabumetone]; and tramadol.  PFSH  Drug: Ms. Jelinek  reports that she does not use drugs. Alcohol:  reports that she drinks alcohol. Tobacco:  reports that she has never smoked. She has never used smokeless tobacco. Medical:  has a past medical history of Arthritis, Complication of anesthesia, Hypertension, Hypothyroidism, Sleep apnea, and Thyroid disease. Surgical: Ms. Maron  has a past surgical history that includes Abdominal hysterectomy; Colonoscopy with propofol (N/A, 09/21/2015); Back surgery; Total knee arthroplasty (Left, 03/21/2016); and Joint replacement. Family: family history is not on file.  Constitutional Exam  General appearance: Well nourished, well developed, and well hydrated. In no apparent acute distress Vitals:   10/22/17 1356  BP: 111/73  Pulse: 87  Resp: 18  Temp: 98.2 F (36.8 C)  SpO2: 99%  Weight: 206 lb (93.4 kg)  Height: _0  (1.702 m)   BMI Assessment: Estimated body mass index is 32.26 kg/m as calculated from the following:   Height as of this encounter: _1  (1.702 m).   Weight as of this encounter: 206 lb (93.4 kg).  BMI interpretation table: BMI level Category Range association with higher incidence of chronic pain  <18 kg/m2 Underweight   18.5-24.9 kg/m2 Ideal body weight   25-29.9 kg/m2 Overweight Increased incidence by 20%  30-34.9 kg/m2 Obese (Class I) Increased incidence by 68%  35-39.9 kg/m2 Severe obesity (Class II) Increased incidence by 136%  >40 kg/m2 Extreme obesity (Class III) Increased incidence by 254%   Patient's current BMI Ideal Body weight  Body mass index is 32.26 kg/m. Ideal body weight: 61.6 kg (135 lb 12.9 oz) Adjusted ideal body weight: 74.3 kg (163 lb 14.1 oz)   BMI Readings from Last 4 Encounters:  10/22/17 32.26 kg/m  09/22/17 41.50 kg/m  08/12/17 33.67 kg/m  07/14/17 33.20 kg/m   Wt Readings from Last 4  Encounters:  10/22/17 206 lb (93.4 kg)  09/22/17 265 lb (120.2 kg)  08/12/17 215 lb (97.5 kg)  07/14/17 212 lb (96.2 kg)  Psych/Mental status: Alert, oriented x 3 (person, place, & time)       Eyes: PERLA Respiratory: No evidence of acute respiratory distress  Cervical Spine Area Exam  Skin & Axial Inspection: No masses, redness, edema, swelling, or associated skin lesions Alignment: Symmetrical Functional ROM: Unrestricted ROM      Stability: No instability detected Muscle Tone/Strength: Functionally intact. No obvious neuro-muscular anomalies detected. Sensory (Neurological): Unimpaired Palpation: No palpable anomalies              Upper Extremity (UE) Exam    Side: Right upper extremity  Side: Left upper extremity  Skin & Extremity Inspection: Skin color, temperature, and hair growth are WNL. No peripheral edema or cyanosis. No masses, redness, swelling, asymmetry, or associated skin lesions. No contractures.  Skin & Extremity Inspection: Skin color, temperature, and hair growth are WNL. No peripheral edema or cyanosis. No masses, redness, swelling, asymmetry, or associated skin lesions. No contractures.  Functional ROM: Unrestricted ROM  Functional ROM: Unrestricted ROM          Muscle Tone/Strength: Functionally intact. No obvious neuro-muscular anomalies detected.  Muscle Tone/Strength: Functionally intact. No obvious neuro-muscular anomalies detected.  Sensory (Neurological): Unimpaired          Sensory (Neurological): Unimpaired          Palpation: No palpable anomalies              Palpation: No palpable anomalies              Specialized Test(s): Deferred         Specialized Test(s): Deferred          Thoracic Spine Area Exam  Skin & Axial Inspection: No masses, redness, or swelling Alignment: Symmetrical Functional ROM: Unrestricted ROM Stability: No instability detected Muscle Tone/Strength: Functionally intact. No obvious neuro-muscular anomalies  detected. Sensory (Neurological): Unimpaired Muscle strength & Tone: No palpable anomalies  Lumbar Spine Area Exam  Skin & Axial Inspection: No masses, redness, or swelling Alignment: Symmetrical Functional ROM: Unrestricted ROM       Stability: No instability detected Muscle Tone/Strength: Functionally intact. No obvious neuro-muscular anomalies detected. Sensory (Neurological): Unimpaired Palpation: No palpable anomalies       Provocative Tests: Lumbar Hyperextension and rotation test: Improved after treatment       Lumbar Lateral bending test: Improved after treatment       Patrick's Maneuver: evaluation deferred today                    Gait & Posture Assessment  Ambulation: Unassisted Gait: Relatively normal for age and body habitus Posture: WNL   Lower Extremity Exam    Side: Right lower extremity  Side: Left lower extremity  Stability: No instability observed          Stability: No instability observed          Skin & Extremity Inspection: Skin color, temperature, and hair growth are WNL. No peripheral edema or cyanosis. No masses, redness, swelling, asymmetry, or associated skin lesions. No contractures.  Skin & Extremity Inspection: Skin color, temperature, and hair growth are WNL. No peripheral edema or cyanosis. No masses, redness, swelling, asymmetry, or associated skin lesions. No contractures.  Functional ROM: Unrestricted ROM                  Functional ROM: Unrestricted ROM                  Muscle Tone/Strength: Functionally intact. No obvious neuro-muscular anomalies detected.  Muscle Tone/Strength: Functionally intact. No obvious neuro-muscular anomalies detected.  Sensory (Neurological): Unimpaired  Sensory (Neurological): Unimpaired  Palpation: No palpable anomalies  Palpation: No palpable anomalies   Assessment  Primary Diagnosis & Pertinent Problem List: The primary encounter diagnosis was Lumbar radiculopathy. Diagnoses of Lumbar degenerative disc disease,  Lumbar foraminal stenosis, and Chronic left-sided low back pain with left-sided sciatica were also pertinent to this visit.  Status Diagnosis  Improved Improved Persistent 1. Lumbar radiculopathy   2. Lumbar degenerative disc disease   3. Lumbar foraminal stenosis   4. Chronic left-sided low back pain with left-sided sciatica      77 year old female with a history of lumbosacral radiculopathy status post L5-S1 epidural steroid injection on the right on 09/22/2017 who presents for postprocedural evaluation.  Patient notes 100% pain relief that is ongoing in regards to her right radicular symptoms.  She states that she is able to perform activities of daily living with greater ease and  notes improvement in her functional status as well as range of motion.  She states that these epidural injections are very helpful for her.  As a result, we will place a PRN order for repeat epidural steroid injection if and when her symptoms return, no earlier than mid July to limit cumulative steroid exposure.  Plan of Care  Lab-work, procedure(s), and/or referral(s): Orders Placed This Encounter  Procedures  . Lumbar Epidural Injection  As needed right L5-S1 ESI without sedation  Provider-requested follow-up: No follow-ups on file.   Time Note: Greater than 50% of the 25 minute(s) of face-to-face time spent with Ms. Aggarwal, was spent in counseling/coordination of care regarding: Ms. Rauda primary cause of pain, the results, interpretation and significance of  her recent diagnostic interventional treatment(s), realistic expectations and the goals of pain management (increased in functionality).  Future Appointments  Date Time Provider Ashley  11/04/2017  1:30 PM [name], MD [facility] None    Primary Care Physician: Glendon Axe, MD Location: [facility] Note by: [name], M.D Date: 10/22/2017; Time: 3:58 PM  There are no Patient Instructions on file  for this visit.

## 2017-10-22 NOTE — Progress Notes (Signed)
Safety precautions to be maintained throughout the outpatient stay will include: orient to surroundings, keep bed in low position, maintain call bell within reach at all times, provide assistance with transfer out of bed and ambulation.  

## 2017-11-04 ENCOUNTER — Encounter: Payer: 59 | Admitting: Student in an Organized Health Care Education/Training Program

## 2017-12-03 ENCOUNTER — Other Ambulatory Visit: Payer: Self-pay | Admitting: Surgery

## 2017-12-03 DIAGNOSIS — M19011 Primary osteoarthritis, right shoulder: Secondary | ICD-10-CM

## 2017-12-03 DIAGNOSIS — M7581 Other shoulder lesions, right shoulder: Secondary | ICD-10-CM

## 2017-12-03 DIAGNOSIS — S46101S Unspecified injury of muscle, fascia and tendon of long head of biceps, right arm, sequela: Secondary | ICD-10-CM

## 2017-12-03 DIAGNOSIS — S46011S Strain of muscle(s) and tendon(s) of the rotator cuff of right shoulder, sequela: Secondary | ICD-10-CM

## 2017-12-12 ENCOUNTER — Ambulatory Visit: Payer: 59 | Attending: Surgery

## 2018-01-02 DIAGNOSIS — E538 Deficiency of other specified B group vitamins: Secondary | ICD-10-CM | POA: Insufficient documentation

## 2018-01-29 ENCOUNTER — Telehealth: Payer: Self-pay | Admitting: Student in an Organized Health Care Education/Training Program

## 2018-01-29 NOTE — Telephone Encounter (Signed)
Patient would like to have injection for her upper back, seems like pain is higher up, will lumbar injection help with this. If so patient wants to schedule. Does she need to come in for eval of pain before?

## 2018-01-30 ENCOUNTER — Telehealth: Payer: Self-pay | Admitting: *Deleted

## 2018-01-30 NOTE — Telephone Encounter (Signed)
Patient has order for PRN LESI.  Spoke with patient and gave her pre procedure instructions without sedation.  Transferred to Tendra to schedule.

## 2018-01-30 NOTE — Telephone Encounter (Signed)
Patient is scheduled 02-02-18 for lesi

## 2018-02-02 ENCOUNTER — Ambulatory Visit
Payer: 59 | Attending: Student in an Organized Health Care Education/Training Program | Admitting: Student in an Organized Health Care Education/Training Program

## 2018-02-02 ENCOUNTER — Encounter: Payer: Self-pay | Admitting: Student in an Organized Health Care Education/Training Program

## 2018-02-02 ENCOUNTER — Ambulatory Visit: Admission: RE | Admit: 2018-02-02 | Payer: 59 | Source: Ambulatory Visit

## 2018-02-02 VITALS — BP 140/70 | HR 100 | Temp 98.5°F | Resp 16 | Ht 67.0 in | Wt 200.0 lb

## 2018-02-02 DIAGNOSIS — Z79899 Other long term (current) drug therapy: Secondary | ICD-10-CM | POA: Insufficient documentation

## 2018-02-02 DIAGNOSIS — M549 Dorsalgia, unspecified: Secondary | ICD-10-CM | POA: Diagnosis present

## 2018-02-02 DIAGNOSIS — Z96652 Presence of left artificial knee joint: Secondary | ICD-10-CM | POA: Diagnosis not present

## 2018-02-02 DIAGNOSIS — Z888 Allergy status to other drugs, medicaments and biological substances status: Secondary | ICD-10-CM | POA: Insufficient documentation

## 2018-02-02 DIAGNOSIS — M7918 Myalgia, other site: Secondary | ICD-10-CM

## 2018-02-02 DIAGNOSIS — Z90710 Acquired absence of both cervix and uterus: Secondary | ICD-10-CM | POA: Diagnosis not present

## 2018-02-02 DIAGNOSIS — Z886 Allergy status to analgesic agent status: Secondary | ICD-10-CM | POA: Diagnosis not present

## 2018-02-02 DIAGNOSIS — M5416 Radiculopathy, lumbar region: Secondary | ICD-10-CM | POA: Diagnosis not present

## 2018-02-02 DIAGNOSIS — Z79890 Hormone replacement therapy (postmenopausal): Secondary | ICD-10-CM | POA: Insufficient documentation

## 2018-02-02 DIAGNOSIS — Z7983 Long term (current) use of bisphosphonates: Secondary | ICD-10-CM | POA: Diagnosis not present

## 2018-02-02 DIAGNOSIS — Z885 Allergy status to narcotic agent status: Secondary | ICD-10-CM | POA: Diagnosis not present

## 2018-02-02 MED ORDER — DEXAMETHASONE SODIUM PHOSPHATE 10 MG/ML IJ SOLN
10.0000 mg | Freq: Once | INTRAMUSCULAR | Status: DC
Start: 2018-02-02 — End: 2018-02-02

## 2018-02-02 MED ORDER — LIDOCAINE HCL 2 % IJ SOLN: 10.0000 mL | Freq: Once | INTRAMUSCULAR | Status: DC

## 2018-02-02 MED ORDER — IOPAMIDOL (ISOVUE-M 200) INJECTION 41%: 10.0000 mL | Freq: Once | INTRAMUSCULAR | Status: DC

## 2018-02-02 MED ORDER — ROPIVACAINE HCL 2 MG/ML IJ SOLN: 2.0000 mL | Freq: Once | INTRAMUSCULAR | Status: DC

## 2018-02-02 MED ORDER — SODIUM CHLORIDE 0.9% FLUSH: 2.0000 mL | Freq: Once | INTRAVENOUS | Status: DC

## 2018-02-02 NOTE — Progress Notes (Signed)
Patient's Name: Erika Crawford  MRN: [id number]  Referring Provider: [name], MD  DOB: 18-Aug-1940  PCP: Glendon Axe, MD  DOS: 02/02/2018  Note by: [name], MD  Service setting: Ambulatory outpatient  Specialty: Interventional Pain Management  Patient type: Established  Location: [facility] (AMB) Pain Management Facility  Visit type: Interventional Procedure   Primary Reason for Visit: Interventional Pain Management Treatment. CC: Back Pain (right, ribs)  Procedure:          Anesthesia, Analgesia, Anxiolysis:  Type: Trigger Point Injection (3+ muscle groups) CPT: 20553 Primary Purpose: Diagnostic Region: Posterolateral (peri-scapular RIGHT)       Level: Cervico-thoracic Target Area: Trigger Point Approach: Percutaneous, ipsilateral approach. Laterality: Right-Sided       Position: Sitting  Type: N.A   Indications: 1. Myofascial pain syndrome   2. Lumbar radiculopathy    Pain Score: Pre-procedure: 6 /10 Post-procedure: 6 /10  Pre-op Assessment:  Erika Crawford is a 77 y.o. (year old), female patient, seen today for interventional treatment. 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 Erika Crawford has a current medication list which includes the following prescription(s): acetaminophen, alendronate, amlodipine, b complex vitamins, calcium carbonate-vitamin d, cyclobenzaprine, levothyroxine, mometasone, omeprazole, senna-docusate, valsartan, ergocalciferol, amlodipine-valsartan, diclofenac, diclofenac sodium, olmesartan, and tapentadol, and the following Facility-Administered Medications: dexamethasone, iopamidol, lidocaine, ropivacaine (pf) 2 mg/ml (0.2%), and sodium chloride flush. Her primarily concern today is the Back Pain (right, ribs)  Initial Vital Signs:  Pulse/HCG Rate: 100  Temp: 98.5 F (36.9 C) Resp: 16 BP: 140/70 SpO2:  100 %  BMI: Estimated body mass index is 31.32 kg/m as calculated from the following:   Height as of this encounter: 5\' 7"  (1.702 m).   Weight as of this encounter: 200 lb (90.7 kg).  Risk Assessment: Allergies: Reviewed. She is allergic to celebrex [celecoxib]; codeine; etodolac; hydrochlorothiazide; oxycodone; pravastatin; relafen [nabumetone]; and tramadol.  Allergy Precautions: None required Coagulopathies: Reviewed. None identified.  Blood-thinner therapy: None at this time Active Infection(s): Reviewed. None identified. Erika Crawford is afebrile  Site Confirmation: Erika Crawford was asked to confirm the procedure and laterality before marking the site Procedure checklist: Completed Consent: Before the procedure and under the influence of no sedative(s), amnesic(s), or anxiolytics, the patient was informed of the treatment options, risks and possible complications. To fulfill our ethical and legal obligations, as recommended by the American Medical Association's Code of Ethics, I have informed the patient of my clinical impression; the nature and purpose of the treatment or procedure; the risks, benefits, and possible complications of the intervention; the alternatives, including doing nothing; the risk(s) and benefit(s) of the alternative treatment(s) or procedure(s); and the risk(s) and benefit(s) of doing nothing. The patient was provided information about the general risks and possible complications associated with the procedure. These may include, but are not limited to: failure to achieve desired goals, infection, bleeding, organ or nerve damage, allergic reactions, paralysis, and death. In addition, the patient was informed of those risks and complications associated to the procedure, such as failure to decrease pain; infection; bleeding; organ or nerve damage with subsequent damage to sensory, motor, and/or autonomic systems, resulting in permanent pain, numbness, and/or weakness of one or  several areas of the body; allergic reactions; (i.e.: anaphylactic reaction); and/or death. Furthermore, the patient was informed of those risks and complications associated with the medications. These include, but are not limited to: allergic reactions (  i.e.: anaphylactic or anaphylactoid reaction(s)); adrenal axis suppression; blood sugar elevation that in diabetics may result in ketoacidosis or comma; water retention that in patients with history of congestive heart failure may result in shortness of breath, pulmonary edema, and decompensation with resultant heart failure; weight gain; swelling or edema; medication-induced neural toxicity; particulate matter embolism and blood vessel occlusion with resultant organ, and/or nervous system infarction; and/or aseptic necrosis of one or more joints. Finally, the patient was informed that Medicine is not an exact science; therefore, there is also the possibility of unforeseen or unpredictable risks and/or possible complications that may result in a catastrophic outcome. The patient indicated having understood very clearly. We have given the patient no guarantees and we have made no promises. Enough time was given to the patient to ask questions, all of which were answered to the patient's satisfaction. Erika Crawford has indicated that she wanted to continue with the procedure. Attestation: I, the ordering provider, attest that I have discussed with the patient the benefits, risks, side-effects, alternatives, likelihood of achieving goals, and potential problems during recovery for the procedure that I have provided informed consent. Date  Time: 02/02/2018 10:37 AM  Pre-Procedure Preparation:  Monitoring: As per clinic protocol. Respiration, ETCO2, SpO2, BP, heart rate and rhythm monitor placed and checked for adequate function Safety Precautions: Patient was assessed for positional comfort and pressure points before starting the procedure. Time-out: I initiated  and conducted the "Time-out" before starting the procedure, as per protocol. The patient was asked to participate by confirming the accuracy of the "Time Out" information. Verification of the correct person, site, and procedure were performed and confirmed by me, the nursing staff, and the patient. "Time-out" conducted as per Joint Commission's Universal Protocol (UP.01.01.01). Time:    Description of Procedure:          Area Prepped: Entire Lateral Cervicothoracic on right Region Prepping solution: ChloraPrep (2% chlorhexidine gluconate and 70% isopropyl alcohol) Safety Precautions: Aspiration looking for blood return was conducted prior to all injections. At no point did we inject any substances, as a needle was being advanced. No attempts were made at seeking any paresthesias. Safe injection practices and needle disposal techniques used. Medications properly checked for expiration dates. SDV (single dose vial) medications used. Description of the Procedure: Protocol guidelines were followed. The patient was placed in position over the fluoroscopy table. The target area was identified and the area prepped in the usual manner. Skin & deeper tissues infiltrated with local anesthetic. Appropriate amount of time allowed to pass for local anesthetics to take effect. The procedure needles were then advanced to the target area. Proper needle placement secured. Negative aspiration confirmed. Solution injected in intermittent fashion, asking for systemic symptoms every 0.5cc of injectate. The needles were then removed and the area cleansed, making sure to leave some of the prepping solution back to take advantage of its long term bactericidal properties.  Vitals:   02/02/18 1043  BP: 140/70  Pulse: 100  Resp: 16  Temp: 98.5 F (36.9 C)  SpO2: 100%  Weight: 200 lb (90.7 kg)  Height: 5\' 7"  (1.702 m)    Start Time:   hrs. End Time:   hrs. Materials:  Needle(s) Type: Epidural needle Gauge: 25G Length:  1.5-in Medication(s): Please see orders for medications and dosing details. Each trigger point injected with 1 cc of 0.2% ropivacaine.  Approximately 15 trigger points injected in the right periscapular, cervical-thoracic region. Imaging Guidance:  Type of Imaging Technique: None used Indication(s): N/A Exposure Time: No patient exposure Contrast: None used. Fluoroscopic Guidance: N/A Ultrasound Guidance: N/A Interpretation: N/A  Antibiotic Prophylaxis:   Anti-infectives (From admission, onward)   None     Indication(s): None identified  Post-operative Assessment:  Post-procedure Vital Signs:  Pulse/HCG Rate: 100  Temp: 98.5 F (36.9 C) Resp: 16 BP: 140/70 SpO2: 100 %  EBL: None  Complications: No immediate post-treatment complications observed by team, or reported by patient.  Note: The patient tolerated the entire procedure well. A repeat set of vitals were taken after the procedure and the patient was kept under observation following institutional policy, for this type of procedure. Post-procedural neurological assessment was performed, showing return to baseline, prior to discharge. The patient was provided with post-procedure discharge instructions, including a section on how to identify potential problems. Should any problems arise concerning this procedure, the patient was given instructions to immediately contact us, at any time, without hesitation. In any case, we plan to contact the patient by telephone for a follow-up status report regarding this interventional procedure.  Comments:  No additional relevant information.  Plan of Care   Imaging Orders  No imaging studies ordered today    Procedure Orders     TRIGGER POINT INJECTION  Medications ordered for procedure: Meds ordered this encounter  Medications  . iopamidol (ISOVUE-M) 41 % intrathecal injection 10 mL  . ropivacaine (PF) 2 mg/mL (0.2%) (NAROPIN) injection 2 mL  . lidocaine (XYLOCAINE) 2  % (with pres) injection 200 mg  . sodium chloride flush (NS) 0.9 % injection 2 mL  . dexamethasone (DECADRON) injection 10 mg   Medications administered: [name] had no medications administered during this visit.  See the medical record for exact dosing, route, and time of administration.  New Prescriptions   No medications on file   Disposition: Discharge home  Discharge Date & Time: 02/02/2018; 1134 hrs.   Physician-requested Follow-up: Return in about 6 weeks (around 03/16/2018) for Post Procedure Evaluation.  Future Appointments  Date Time Provider [place]  03/03/2018 11:45 AM [name], MD [facility] None   Primary Care Physician: Glendon Axe, MD Location: [facility] Outpatient [facility] Facility Note by: [name], MD Date: 02/02/2018; Time: 11:40 AM  Disclaimer:  Medicine is not an exact science. The only guarantee in medicine is that nothing is guaranteed. It is important to note that the decision to proceed with this intervention was based on the information collected from the patient. The Data and conclusions were drawn from the patient's questionnaire, the interview, and the physical examination. Because the information was provided in large part by the patient, it cannot be guaranteed that it has not been purposely or unconsciously manipulated. Every effort has been made to obtain as much relevant data as possible for this evaluation. It is important to note that the conclusions that lead to this procedure are derived in large part from the available data. Always take into account that the treatment will also be dependent on availability of resources and existing treatment guidelines, considered by other Pain Management Practitioners as being common knowledge and practice, at the time of the intervention. For Medico-Legal purposes, it is also important to point out that variation in procedural techniques and pharmacological choices are the acceptable  norm. The indications, contraindications, technique, and results of the above procedure should only be interpreted and judged by a Board-Certified Interventional Pain Specialist with extensive familiarity and expertise in the same exact procedure and  technique.

## 2018-02-02 NOTE — Patient Instructions (Signed)
____________________________________________________________________________________________  Post-Procedure Discharge Instructions  Instructions:  Apply ice: Fill a plastic sandwich bag with crushed ice. Cover it with a small towel and apply to injection site. Apply for 15 minutes then remove x 15 minutes. Repeat sequence on day of procedure, until you go to bed. The purpose is to minimize swelling and discomfort after procedure.  Apply heat: Apply heat to procedure site starting the day following the procedure. The purpose is to treat any soreness and discomfort from the procedure.  Food intake: Start with clear liquids (like water) and advance to regular food, as tolerated.   Physical activities: Keep activities to a minimum for the first 8 hours after the procedure.   Driving: If you have received any sedation, you are not allowed to drive for 24 hours after your procedure.  Blood thinner: Restart your blood thinner 6 hours after your procedure. (Only for those taking blood thinners)  Insulin: As soon as you can eat, you may resume your normal dosing schedule. (Only for those taking insulin)  Infection prevention: Keep procedure site clean and dry.  Post-procedure Pain Diary: Extremely important that this be done correctly and accurately. Recorded information will be used to determine the next step in treatment.  Pain evaluated is that of treated area only. Do not include pain from an untreated area.  Complete every hour, on the hour, for the initial 8 hours. Set an alarm to help you do this part accurately.  Do not go to sleep and have it completed later. It will not be accurate.  Follow-up appointment: Keep your follow-up appointment after the procedure. Usually 2 weeks for most procedures. (6 weeks in the case of radiofrequency.) Bring you pain diary.   Expect:  From numbing medicine (AKA: Local Anesthetics): Numbness or decrease in pain.  Onset: Full effect within 15  minutes of injected.  Duration: It will depend on the type of local anesthetic used. On the average, 1 to 8 hours.   From steroids: Decrease in swelling or inflammation. Once inflammation is improved, relief of the pain will follow.  Onset of benefits: Depends on the amount of swelling present. The more swelling, the longer it will take for the benefits to be seen. In some cases, up to 10 days.  Duration: Steroids will stay in the system x 2 weeks. Duration of benefits will depend on multiple posibilities including persistent irritating factors.  From procedure: Some discomfort is to be expected once the numbing medicine wears off. This should be minimal if ice and heat are applied as instructed.  Call if:  You experience numbness and weakness that gets worse with time, as opposed to wearing off.  New onset bowel or bladder incontinence. (This applies to Spinal procedures only)  Emergency Numbers:  [place] business hours (Monday - Thursday, 8:00 AM - 4:00 PM) (Friday, 9:00 AM - 12:00 Noon): [phone]  After hours: [phone] ____________________________________________________________________________________________  Trigger Point Injection Trigger points are areas where you have pain. A trigger point injection is a shot given in the trigger point to help relieve pain for a few days to a few months. Common places for trigger points include:  The neck.  The shoulders.  The upper back.  The lower back.  A trigger point injection will not cure long-lasting (chronic) pain permanently. These injections do not always work for every person, but for some people they can help to relieve pain for a few days to a few months. Tell a health care provider about:  Any allergies you have.  All medicines you are taking, including vitamins, herbs, eye drops, creams, and over-the-counter medicines.  Any problems you or family members have had with anesthetic medicines.  Any blood  disorders you have.  Any surgeries you have had.  Any medical conditions you have. What are the risks? Generally, this is a safe procedure. However, problems may occur, including:  Infection.  Bleeding.  Allergic reaction to the injected medicine.  Irritation of the skin around the injection site.  What happens before the procedure?  Ask your health care provider about changing or stopping your regular medicines. This is especially important if you are taking diabetes medicines or blood thinners. What happens during the procedure?  Your health care provider will feel for trigger points. A marker may be used to circle the area for the injection.  The skin over the trigger point will be washed with a germ-killing (antiseptic) solution.  A thin needle is used for the shot. You may feel pain or a twitching feeling when the needle enters the trigger point.  A numbing solution may be injected into the trigger point. Sometimes a medicine to keep down swelling, redness, and warmth (inflammation) is also injected.  Your health care provider may move the needle around the area where the trigger point is located until the tightness and twitching goes away.  After the injection, your health care provider may put gentle pressure over the injection site.  The injection site will be covered with a bandage (dressing). The procedure may vary among health care providers and hospitals. What happens after the procedure?  The dressing can be taken off in a few hours or as told by your health care provider.  You may feel sore and stiff for 1-2 days. This information is not intended to replace advice given to you by your health care provider. Make sure you discuss any questions you have with your health care provider. Document Released: 05/23/2011 Document Revised: 02/04/2016 Document Reviewed: 11/21/2014 Elsevier Interactive Patient Education  2018 [employer].

## 2018-02-02 NOTE — Progress Notes (Signed)
Safety precautions to be maintained throughout the outpatient stay will include: orient to surroundings, keep bed in low position, maintain call bell within reach at all times, provide assistance with transfer out of bed and ambulation.  

## 2018-02-02 NOTE — Progress Notes (Deleted)
Patient's Name: Erika Crawford  MRN: [id number]  Referring Provider: [name], MD  DOB: [birth year]  PCP: Glendon Axe, MD  DOS: 02/02/2018  Note by: [name], MD  Service setting: Ambulatory outpatient  Specialty: Interventional Pain Management  Patient type: Established  Location: [facility] (AMB) Pain Management Facility  Visit type: Interventional Procedure   Primary Reason for Visit: Interventional Pain Management Treatment. CC: No chief complaint on file.  Procedure:       Anesthesia, Analgesia, Anxiolysis:  Type: Therapeutic Inter-Laminar Epidural Steroid Injection #4 (#3 on 09/22/17; #2 on 07/14/17; #1 on 04/14/17) Region: Lumbar Level: L5-S1 Level. Laterality: Right-Sided         Type: Local Anesthesia Indication(s): Analgesia and Anxiety Route: Infiltration ([place]/IM) IV Access: Declined Sedation: Declined  Local Anesthetic: Lidocaine 1-2%   Indications: 1. Lumbar radiculopathy    Pain Score: Pre-procedure:  /10 Post-procedure:  /10  Pre-op Assessment:  Erika Crawford is a 77 y.o. (year old), female patient, seen today for interventional treatment. 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 Erika Crawford has a current medication list which includes the following prescription(s): acetaminophen, alendronate, amlodipine, amlodipine-valsartan, b complex vitamins, calcium carbonate-vitamin d, cyclobenzaprine, diclofenac, diclofenac sodium, levothyroxine, mometasone, olmesartan, omeprazole, senna-docusate, tapentadol, valsartan, and ergocalciferol. Her primarily concern today is the No chief complaint on file.  Initial Vital Signs:  Pulse Rate:   Temp:   Resp:   BP:   SpO2:    BMI: Estimated body mass index is 32.26 kg/m as calculated from the following:   Height as of 10/22/17: 5\' 7"  (1.702 m).   Weight as of 10/22/17: 206 lb (93.4  kg).  Risk Assessment: Allergies: Reviewed. She is allergic to celebrex [celecoxib]; codeine; etodolac; hydrochlorothiazide; oxycodone; pravastatin; relafen [nabumetone]; and tramadol.  Allergy Precautions: None required Coagulopathies: Reviewed. None identified.  Blood-thinner therapy: None at this time Active Infection(s): Reviewed. None identified. Erika Crawford is afebrile  Site Confirmation: Erika Crawford was asked to confirm the procedure and laterality before marking the site Procedure checklist: Completed Consent: Before the procedure and under the influence of no sedative(s), amnesic(s), or anxiolytics, the patient was informed of the treatment options, risks and possible complications. To fulfill our ethical and legal obligations, as recommended by the American Medical Association's Code of Ethics, I have informed the patient of my clinical impression; the nature and purpose of the treatment or procedure; the risks, benefits, and possible complications of the intervention; the alternatives, including doing nothing; the risk(s) and benefit(s) of the alternative treatment(s) or procedure(s); and the risk(s) and benefit(s) of doing nothing. The patient was provided information about the general risks and possible complications associated with the procedure. These may include, but are not limited to: failure to achieve desired goals, infection, bleeding, organ or nerve damage, allergic reactions, paralysis, and death. In addition, the patient was informed of those risks and complications associated to Spine-related procedures, such as failure to decrease pain; infection (i.e.: Meningitis, epidural or intraspinal abscess); bleeding (i.e.: epidural hematoma, subarachnoid hemorrhage, or any other type of intraspinal or peri-dural bleeding); organ or nerve damage (i.e.: Any type of peripheral nerve, nerve root, or spinal cord injury) with subsequent damage to sensory, motor, and/or autonomic systems,  resulting in permanent pain, numbness, and/or weakness of one or several areas of the body; allergic reactions; (i.e.: anaphylactic reaction); and/or death. Furthermore, the patient was informed of those risks and complications associated  with the medications. These include, but are not limited to: allergic reactions (i.e.: anaphylactic or anaphylactoid reaction(s)); adrenal axis suppression; blood sugar elevation that in diabetics may result in ketoacidosis or comma; water retention that in patients with history of congestive heart failure may result in shortness of breath, pulmonary edema, and decompensation with resultant heart failure; weight gain; swelling or edema; medication-induced neural toxicity; particulate matter embolism and blood vessel occlusion with resultant organ, and/or nervous system infarction; and/or aseptic necrosis of one or more joints. Finally, the patient was informed that Medicine is not an exact science; therefore, there is also the possibility of unforeseen or unpredictable risks and/or possible complications that may result in a catastrophic outcome. The patient indicated having understood very clearly. We have given the patient no guarantees and we have made no promises. Enough time was given to the patient to ask questions, all of which were answered to the patient's satisfaction. Erika Crawford has indicated that she wanted to continue with the procedure. Attestation: I, the ordering provider, attest that I have discussed with the patient the benefits, risks, side-effects, alternatives, likelihood of achieving goals, and potential problems during recovery for the procedure that I have provided informed consent. Date  Time:   Pre-Procedure Preparation:  Monitoring: As per clinic protocol. Respiration, ETCO2, SpO2, BP, heart rate and rhythm monitor placed and checked for adequate function Safety Precautions: Patient was assessed for positional comfort and pressure points before  starting the procedure. Time-out: I initiated and conducted the "Time-out" before starting the procedure, as per protocol. The patient was asked to participate by confirming the accuracy of the "Time Out" information. Verification of the correct person, site, and procedure were performed and confirmed by me, the nursing staff, and the patient. "Time-out" conducted as per Joint Commission's Universal Protocol (UP.01.01.01). Time:    Description of Procedure:       Position: Prone with head of the table was raised to facilitate breathing. Target Area: The interlaminar space, initially targeting the lower laminar border of the superior vertebral body. Approach: Paramedial approach. Area Prepped: Entire Posterior Lumbar Region Prepping solution: ChloraPrep (2% chlorhexidine gluconate and 70% isopropyl alcohol) Safety Precautions: Aspiration looking for blood return was conducted prior to all injections. At no point did we inject any substances, as a needle was being advanced. No attempts were made at seeking any paresthesias. Safe injection practices and needle disposal techniques used. Medications properly checked for expiration dates. SDV (single dose vial) medications used. Description of the Procedure: Protocol guidelines were followed. The procedure needle was introduced through the skin, ipsilateral to the reported pain, and advanced to the target area. Bone was contacted and the needle walked caudad, until the lamina was cleared. The epidural space was identified using "loss-of-resistance technique" with 2-3 ml of PF-NaCl (0.9% NSS), in a 5cc LOR glass syringe. There were no vitals filed for this visit.  Start Time:   hrs. End Time:   hrs. Materials:  Needle(s) Type: Epidural needle Gauge: 17G Length: 5-in Medication(s): Please see orders for medications and dosing details. 8 CC solution made of 5 cc of preservative-free saline, 2 cc of 0.2% ropivacaine, 1 cc of Decadron 10 mill grams per  cc Imaging Guidance (Spinal):  Type of Imaging Technique: Fluoroscopy Guidance (Spinal) Indication(s): Assistance in needle guidance and placement for procedures requiring needle placement in or near specific anatomical locations not easily accessible without such assistance. Exposure Time: Please see nurses notes. Contrast: Before injecting any contrast, we confirmed that the patient did  not have an allergy to iodine, shellfish, or radiological contrast. Once satisfactory needle placement was completed at the desired level, radiological contrast was injected. Contrast injected under live fluoroscopy. No contrast complications. See chart for type and volume of contrast used. Fluoroscopic Guidance: I was personally present during the use of fluoroscopy. "Tunnel Vision Technique" used to obtain the best possible view of the target area. Parallax error corrected before commencing the procedure. "Direction-depth-direction" technique used to introduce the needle under continuous pulsed fluoroscopy. Once target was reached, antero-posterior, oblique, and lateral fluoroscopic projection used confirm needle placement in all planes. Images permanently stored in EMR. Interpretation: I personally interpreted the imaging intraoperatively. Adequate needle placement confirmed in multiple planes. Appropriate spread of contrast into desired area was observed. No evidence of afferent or efferent intravascular uptake. No intrathecal or subarachnoid spread observed. Permanent images saved into the patient's record.  Antibiotic Prophylaxis:   Anti-infectives (From admission, onward)   None     Indication(s): None identified  Post-operative Assessment:  Post-procedure Vital Signs:  Pulse Rate:   Temp:   Resp:   BP:   SpO2:    EBL: None  Complications: No immediate post-treatment complications observed by team, or reported by patient.  Note: The patient tolerated the entire procedure well. A repeat set of  vitals were taken after the procedure and the patient was kept under observation following institutional policy, for this type of procedure. Post-procedural neurological assessment was performed, showing return to baseline, prior to discharge. The patient was provided with post-procedure discharge instructions, including a section on how to identify potential problems. Should any problems arise concerning this procedure, the patient was given instructions to immediately contact us, at any time, without hesitation. In any case, we plan to contact the patient by telephone for a follow-up status report regarding this interventional procedure.  Comments:  No additional relevant information.  5 out of 5 strength bilateral lower extremity: Plantar flexion, dorsiflexion, knee flexion, knee extension.  Plan of Care   Imaging Orders  No imaging studies ordered today   Procedure Orders    No procedure(s) ordered today    Medications ordered for procedure: No orders of the defined types were placed in this encounter.  Medications administered: [name] had no medications administered during this visit.  See the medical record for exact dosing, route, and time of administration.  New Prescriptions   No medications on file   Disposition: Discharge home  Discharge Date & Time: 02/02/2018;   hrs.   Physician-requested Follow-up: No follow-ups on file.  Future Appointments  Date Time Provider [place]  02/02/2018 10:45 AM [name], MD [facility] None   Primary Care Physician: Glendon Axe, MD Location: [facility] Facility Note by: [name], MD Date: 02/02/2018; Time: 10:42 AM  Disclaimer:  Medicine is not an exact science. The only guarantee in medicine is that nothing is guaranteed. It is important to note that the decision to proceed with this intervention was based on the information collected from the patient. The Data and conclusions were  drawn from the patient's questionnaire, the interview, and the physical examination. Because the information was provided in large part by the patient, it cannot be guaranteed that it has not been purposely or unconsciously manipulated. Every effort has been made to obtain as much relevant data as possible for this evaluation. It is important to note that the conclusions that lead to this procedure are derived in large part from the available data. Always  take into account that the treatment will also be dependent on availability of resources and existing treatment guidelines, considered by other Pain Management Practitioners as being common knowledge and practice, at the time of the intervention. For Medico-Legal purposes, it is also important to point out that variation in procedural techniques and pharmacological choices are the acceptable norm. The indications, contraindications, technique, and results of the above procedure should only be interpreted and judged by a Board-Certified Interventional Pain Specialist with extensive familiarity and expertise in the same exact procedure and technique.

## 2018-02-03 ENCOUNTER — Telehealth: Payer: Self-pay | Admitting: *Deleted

## 2018-02-03 NOTE — Telephone Encounter (Signed)
Left voicemail with patient that if she has any questions or concerns re; procedure on yesterday to please give [place] a call.

## 2018-02-10 ENCOUNTER — Other Ambulatory Visit: Payer: 59

## 2018-02-17 ENCOUNTER — Telehealth: Payer: Self-pay | Admitting: *Deleted

## 2018-02-18 ENCOUNTER — Other Ambulatory Visit: Payer: Self-pay | Admitting: Nurse Practitioner

## 2018-02-18 DIAGNOSIS — M5416 Radiculopathy, lumbar region: Secondary | ICD-10-CM

## 2018-02-18 NOTE — Telephone Encounter (Signed)
B-LESI order in

## 2018-02-18 NOTE — Telephone Encounter (Signed)
Spoke with patient and she states she is having pain in her back that is going into both hips and down both legs.  This is an ongoing problem that Dr Holley Raring has treated before with an epidural.  There are no standing orders that I can see.  Explained to patient that I would send this message to [employer] And see if she will place an order for this procedure.  Erika Crawford does not desire sedation, she is not currently on blood thinners or diabetes medicine.  She does take BP medicine and is aware that she will need to take it that morning.  She will wait for [place] to get back in touch with her.

## 2018-02-24 ENCOUNTER — Inpatient Hospital Stay: Admit: 2018-02-24 | Payer: 59 | Admitting: Surgery

## 2018-02-24 SURGERY — ARTHROPLASTY, SHOULDER, TOTAL, REVERSE
Anesthesia: Choice | Laterality: Right

## 2018-03-02 ENCOUNTER — Other Ambulatory Visit: Payer: Self-pay | Admitting: Surgery

## 2018-03-02 DIAGNOSIS — M7582 Other shoulder lesions, left shoulder: Secondary | ICD-10-CM

## 2018-03-03 ENCOUNTER — Ambulatory Visit: Payer: 59 | Admitting: Student in an Organized Health Care Education/Training Program

## 2018-03-03 ENCOUNTER — Telehealth: Payer: Self-pay | Admitting: *Deleted

## 2018-03-04 ENCOUNTER — Ambulatory Visit
Admission: RE | Admit: 2018-03-04 | Discharge: 2018-03-04 | Disposition: A | Discharge status: Home / Self Care | Payer: 59 | Source: Ambulatory Visit | Attending: Student in an Organized Health Care Education/Training Program | Admitting: Student in an Organized Health Care Education/Training Program

## 2018-03-04 ENCOUNTER — Ambulatory Visit (HOSPITAL_BASED_OUTPATIENT_CLINIC_OR_DEPARTMENT_OTHER): Payer: 59 | Admitting: Student in an Organized Health Care Education/Training Program

## 2018-03-04 ENCOUNTER — Other Ambulatory Visit: Payer: Self-pay

## 2018-03-04 ENCOUNTER — Encounter: Payer: Self-pay | Admitting: Student in an Organized Health Care Education/Training Program

## 2018-03-04 DIAGNOSIS — Z90710 Acquired absence of both cervix and uterus: Secondary | ICD-10-CM | POA: Insufficient documentation

## 2018-03-04 DIAGNOSIS — Z79899 Other long term (current) drug therapy: Secondary | ICD-10-CM | POA: Diagnosis not present

## 2018-03-04 DIAGNOSIS — Z791 Long term (current) use of non-steroidal anti-inflammatories (NSAID): Secondary | ICD-10-CM | POA: Insufficient documentation

## 2018-03-04 DIAGNOSIS — M5416 Radiculopathy, lumbar region: Secondary | ICD-10-CM | POA: Insufficient documentation

## 2018-03-04 DIAGNOSIS — F419 Anxiety disorder, unspecified: Secondary | ICD-10-CM | POA: Insufficient documentation

## 2018-03-04 DIAGNOSIS — Z96652 Presence of left artificial knee joint: Secondary | ICD-10-CM | POA: Insufficient documentation

## 2018-03-04 DIAGNOSIS — Z888 Allergy status to other drugs, medicaments and biological substances status: Secondary | ICD-10-CM | POA: Insufficient documentation

## 2018-03-04 DIAGNOSIS — Z886 Allergy status to analgesic agent status: Secondary | ICD-10-CM | POA: Diagnosis not present

## 2018-03-04 DIAGNOSIS — Z885 Allergy status to narcotic agent status: Secondary | ICD-10-CM | POA: Diagnosis not present

## 2018-03-04 DIAGNOSIS — Z79890 Hormone replacement therapy (postmenopausal): Secondary | ICD-10-CM | POA: Insufficient documentation

## 2018-03-04 DIAGNOSIS — M545 Low back pain: Secondary | ICD-10-CM | POA: Diagnosis present

## 2018-03-04 MED ORDER — ROPIVACAINE HCL 2 MG/ML IJ SOLN
2.0000 mL | Freq: Once | INTRAMUSCULAR | Status: AC
  Administered 2018-03-04: 2 mL via EPIDURAL

## 2018-03-04 MED ORDER — ROPIVACAINE HCL 2 MG/ML IJ SOLN
INTRAMUSCULAR | Status: AC
  Filled 2018-03-04: qty 10

## 2018-03-04 MED ORDER — DEXAMETHASONE SODIUM PHOSPHATE 10 MG/ML IJ SOLN
10.0000 mg | Freq: Once | INTRAMUSCULAR | Status: AC
  Administered 2018-03-04: 10 mg

## 2018-03-04 MED ORDER — DEXAMETHASONE SODIUM PHOSPHATE 10 MG/ML IJ SOLN
INTRAMUSCULAR | Status: AC
  Filled 2018-03-04: qty 1

## 2018-03-04 MED ORDER — SODIUM CHLORIDE 0.9% FLUSH
2.0000 mL | Freq: Once | INTRAVENOUS | Status: AC
  Administered 2018-03-04: 10 mL

## 2018-03-04 MED ORDER — SODIUM CHLORIDE 0.9 % IJ SOLN
INTRAMUSCULAR | Status: AC
  Filled 2018-03-04: qty 10

## 2018-03-04 MED ORDER — IOPAMIDOL (ISOVUE-M 200) INJECTION 41%
10.0000 mL | Freq: Once | INTRAMUSCULAR | Status: AC
Start: 2018-03-04 — End: 2018-03-04
  Administered 2018-03-04: 10 mL via EPIDURAL
  Filled 2018-03-04: qty 10

## 2018-03-04 MED ORDER — LIDOCAINE HCL 2 % IJ SOLN
INTRAMUSCULAR | Status: AC
  Filled 2018-03-04: qty 20

## 2018-03-04 MED ORDER — LIDOCAINE HCL 2 % IJ SOLN
10.0000 mL | Freq: Once | INTRAMUSCULAR | Status: AC
  Administered 2018-03-04: 400 mg

## 2018-03-04 NOTE — Patient Instructions (Addendum)
Pain Management Discharge Instructions  General Discharge Instructions :  If you need to reach your doctor call: Monday-Friday 8:00 am - 4:00 pm at [phone] or toll free [phone].  After clinic hours [phone] to have operator reach doctor.  Bring all of your medication bottles to all your appointments in the pain clinic.  To cancel or reschedule your appointment with Pain Management please remember to call 24 hours in advance to avoid a fee.  Refer to the educational materials which you have been given on: General Risks, I had my Procedure. Discharge Instructions, Post Sedation.  Post Procedure Instructions:  The drugs you were given will stay in your system until tomorrow, so for the next 24 hours you should not drive, make any legal decisions or drink any alcoholic beverages.  You may eat anything you prefer, but it is better to start with liquids then soups and crackers, and gradually work up to solid foods.  Please notify your doctor immediately if you have any unusual bleeding, trouble breathing or pain that is not related to your normal pain.  Depending on the type of procedure that was done, some parts of your body may feel week and/or numb.  This usually clears up by tonight or the next day.  Walk with the use of an assistive device or accompanied by an adult for the 24 hours.  You may use ice on the affected area for the first 24 hours.  Put ice in a Ziploc bag and cover with a towel and place against area 15 minutes on 15 minutes off.  You may switch to heat after 24 hours.Epidural Steroid Injection Patient Information  Description: The epidural space surrounds the nerves as they exit the spinal cord.  In some patients, the nerves can be compressed and inflamed by a bulging disc or a tight spinal canal (spinal stenosis).  By injecting steroids into the epidural space, we can bring irritated nerves into direct contact with a potentially helpful medication.  These  steroids act directly on the irritated nerves and can reduce swelling and inflammation which often leads to decreased pain.  Epidural steroids may be injected anywhere along the spine and from the neck to the low back depending upon the location of your pain.   After numbing the skin with local anesthetic (like Novocaine), a small needle is passed into the epidural space slowly.  You may experience a sensation of pressure while this is being done.  The entire block usually last less than 10 minutes.  Conditions which may be treated by epidural steroids:   Low back and leg pain  Neck and arm pain  Spinal stenosis  Post-laminectomy syndrome  Herpes zoster (shingles) pain  Pain from compression fractures  Preparation for the injection:  1. Do not eat any solid food or dairy products within 8 hours of your appointment.  2. You may drink clear liquids up to 3 hours before appointment.  Clear liquids include water, black coffee, juice or soda.  No milk or cream please. 3. You may take your regular medication, including pain medications, with a sip of water before your appointment  Diabetics should hold regular insulin (if taken separately) and take 1/2 normal NPH dos the morning of the procedure.  Carry some sugar containing items with you to your appointment. 4. A driver must accompany you and be prepared to drive you home after your procedure.  5. Bring all your current medications with your. 6. An IV may be inserted and   sedation may be given at the discretion of the physician.   7. A blood pressure cuff, EKG and other monitors will often be applied during the procedure.  Some patients may need to have extra oxygen administered for a short period. 8. You will be asked to provide medical information, including your allergies, prior to the procedure.  We must know immediately if you are taking blood thinners (like Coumadin/Warfarin)  Or if you are allergic to IV iodine contrast (dye). We must  know if you could possible be pregnant.  Possible side-effects:  Bleeding from needle site  Infection (rare, may require surgery)  Nerve injury (rare)  Numbness & tingling (temporary)  Difficulty urinating (rare, temporary)  Spinal headache ( a headache worse with upright posture)  Light -headedness (temporary)  Pain at injection site (several days)  Decreased blood pressure (temporary)  Weakness in arm/leg (temporary)  Pressure sensation in back/neck (temporary)  Call if you experience:  Fever/chills associated with headache or increased back/neck pain.  Headache worsened by an upright position.  New onset weakness or numbness of an extremity below the injection site  Hives or difficulty breathing (go to the emergency room)  Inflammation or drainage at the infection site  Severe back/neck pain  Any new symptoms which are concerning to you  Please note:  Although the local anesthetic injected can often make your back or neck feel good for several hours after the injection, the pain will likely return.  It takes 3-7 days for steroids to work in the epidural space.  You may not notice any pain relief for at least that one week.  If effective, we will often do a series of three injections spaced 3-6 weeks apart to maximally decrease your pain.  After the initial series, we generally will wait several months before considering a repeat injection of the same type.  If you have any questions, please call [phone] [facility]  General Discharge Instructions :  If you need to reach your doctor call: Monday-Friday 8:00 am - 4:00 pm at [phone] or toll free [phone].  After clinic hours [phone] to have operator reach doctor.  Bring all of your medication bottles to all your appointments in the pain clinic.  To cancel or reschedule your appointment with Pain Management please  remember to call 24 hours in advance to avoid a fee.  Refer to the educational materials which you have been given on: General Risks, I had my Procedure. Discharge Instructions, Post Sedation.  Post Procedure Instructions:  The drugs you were given will stay in your system until tomorrow, so for the next 24 hours you should not drive, make any legal decisions or drink any alcoholic beverages.  You may eat anything you prefer, but it is better to start with liquids then soups and crackers, and gradually work up to solid foods.  Please notify your doctor immediately if you have any unusual bleeding, trouble breathing or pain that is not related to your normal pain.  Depending on the type of procedure that was done, some parts of your body may feel week and/or numb.  This usually clears up by tonight or the next day.  Walk with the use of an assistive device or accompanied by an adult for the 24 hours.  You may use ice on the affected area for the first 24 hours.  Put ice in a Ziploc bag and cover with a towel and place against area 15  minutes on 15 minutes off.  You may switch to heat after 24 hours.

## 2018-03-04 NOTE — Progress Notes (Signed)
Patient's Name: Erika Crawford  MRN: [id number]  Referring Provider: Vevelyn Francois, NP  DOB: [birth year]  PCP: Glendon Axe, MD  DOS: 03/04/2018  Note by: [name], MD  Service setting: Ambulatory outpatient  Specialty: Interventional Pain Management  Patient type: Established  Location: [facility] (AMB) Pain Management Facility  Visit type: Interventional Procedure   Primary Reason for Visit: Interventional Pain Management Treatment. CC: Back Pain (low) and Pain (buttocks)  Procedure:       Anesthesia, Analgesia, Anxiolysis:  Type: Therapeutic Inter-Laminar Epidural Steroid Injection #4  (over 1 year: #3 09/22/17, #2 on 07/14/17, #1 on 04/14/18) Region: Lumbar Level: L5-S1 Level. Laterality: Midline         Type: Local Anesthesia Indication(s): Analgesia and Anxiety Route: Infiltration (Tobias/IM) IV Access: Declined Sedation: Declined  Local Anesthetic: Lidocaine 1-2%   Indications: 1. Lumbar radiculopathy    Pain Score: Pre-procedure: 6 /10 Post-procedure: 6 /10  Pre-op Assessment:  Erika Crawford is a 77 y.o. (year old), female patient, seen today for interventional treatment. 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 Erika Crawford has a current medication list which includes the following prescription(s): acetaminophen, alendronate, amlodipine, amlodipine-valsartan, b complex vitamins, calcium carbonate-vitamin d, cyclobenzaprine, diclofenac sodium, levothyroxine, meloxicam, mometasone, olmesartan, omeprazole, senna-docusate, valsartan, ergocalciferol, diclofenac, and tapentadol. Her primarily concern today is the Back Pain (low) and Pain (buttocks)  Initial Vital Signs:  Pulse Rate: 68 Temp: 98.1 F (36.7 C) Resp: 16 BP: (!) 161/64 SpO2: 100 %  BMI: Estimated body mass index is 31.32 kg/m as calculated from the following:   Height as  of this encounter: 5\' 7"  (1.702 m).   Weight as of this encounter: 200 lb (90.7 kg).  Risk Assessment: Allergies: Reviewed. She is allergic to celebrex [celecoxib]; codeine; etodolac; hydrochlorothiazide; oxycodone; pravastatin; relafen [nabumetone]; and tramadol.  Allergy Precautions: None required Coagulopathies: Reviewed. None identified.  Blood-thinner therapy: None at this time Active Infection(s): Reviewed. None identified. Erika Crawford is afebrile  Site Confirmation: Erika Crawford was asked to confirm the procedure and laterality before marking the site Procedure checklist: Completed Consent: Before the procedure and under the influence of no sedative(s), amnesic(s), or anxiolytics, the patient was informed of the treatment options, risks and possible complications. To fulfill our ethical and legal obligations, as recommended by the American Medical Association's Code of Ethics, I have informed the patient of my clinical impression; the nature and purpose of the treatment or procedure; the risks, benefits, and possible complications of the intervention; the alternatives, including doing nothing; the risk(s) and benefit(s) of the alternative treatment(s) or procedure(s); and the risk(s) and benefit(s) of doing nothing. The patient was provided information about the general risks and possible complications associated with the procedure. These may include, but are not limited to: failure to achieve desired goals, infection, bleeding, organ or nerve damage, allergic reactions, paralysis, and death. In addition, the patient was informed of those risks and complications associated to Spine-related procedures, such as failure to decrease pain; infection (i.e.: Meningitis, epidural or intraspinal abscess); bleeding (i.e.: epidural hematoma, subarachnoid hemorrhage, or any other type of intraspinal or peri-dural bleeding); organ or nerve damage (i.e.: Any type of peripheral nerve, nerve root, or spinal cord  injury) with subsequent damage to sensory, motor, and/or autonomic systems, resulting in permanent pain, numbness, and/or weakness of one or several areas of the body; allergic reactions; (i.e.: anaphylactic reaction); and/or death. Furthermore, the  patient was informed of those risks and complications associated with the medications. These include, but are not limited to: allergic reactions (i.e.: anaphylactic or anaphylactoid reaction(s)); adrenal axis suppression; blood sugar elevation that in diabetics may result in ketoacidosis or comma; water retention that in patients with history of congestive heart failure may result in shortness of breath, pulmonary edema, and decompensation with resultant heart failure; weight gain; swelling or edema; medication-induced neural toxicity; particulate matter embolism and blood vessel occlusion with resultant organ, and/or nervous system infarction; and/or aseptic necrosis of one or more joints. Finally, the patient was informed that Medicine is not an exact science; therefore, there is also the possibility of unforeseen or unpredictable risks and/or possible complications that may result in a catastrophic outcome. The patient indicated having understood very clearly. We have given the patient no guarantees and we have made no promises. Enough time was given to the patient to ask questions, all of which were answered to the patient's satisfaction. Erika Crawford has indicated that she wanted to continue with the procedure. Attestation: I, the ordering provider, attest that I have discussed with the patient the benefits, risks, side-effects, alternatives, likelihood of achieving goals, and potential problems during recovery for the procedure that I have provided informed consent. Date  Time: 03/04/2018 11:53 AM  Pre-Procedure Preparation:  Monitoring: As per clinic protocol. Respiration, ETCO2, SpO2, BP, heart rate and rhythm monitor placed and checked for adequate  function Safety Precautions: Patient was assessed for positional comfort and pressure points before starting the procedure. Time-out: I initiated and conducted the "Time-out" before starting the procedure, as per protocol. The patient was asked to participate by confirming the accuracy of the "Time Out" information. Verification of the correct person, site, and procedure were performed and confirmed by me, the nursing staff, and the patient. "Time-out" conducted as per Joint Commission's Universal Protocol (UP.01.01.01). Time: 1243  Description of Procedure:       Position: Prone with head of the table was raised to facilitate breathing. Target Area: The interlaminar space, initially targeting the lower laminar border of the superior vertebral body. Approach: Paramedial approach. Area Prepped: Entire Posterior Lumbar Region Prepping solution: ChloraPrep (2% chlorhexidine gluconate and 70% isopropyl alcohol) Safety Precautions: Aspiration looking for blood return was conducted prior to all injections. At no point did we inject any substances, as a needle was being advanced. No attempts were made at seeking any paresthesias. Safe injection practices and needle disposal techniques used. Medications properly checked for expiration dates. SDV (single dose vial) medications used. Description of the Procedure: Protocol guidelines were followed. The procedure needle was introduced through the skin, ipsilateral to the reported pain, and advanced to the target area. Bone was contacted and the needle walked caudad, until the lamina was cleared. The epidural space was identified using "loss-of-resistance technique" with 2-3 ml of PF-NaCl (0.9% NSS), in a 5cc LOR glass syringe. Vitals:   03/04/18 1151 03/04/18 1200 03/04/18 1243 03/04/18 1249  BP: (!) 161/64 (!) 185/167 (!) 141/122 (!) 174/100  Pulse: 68 78    Resp: 16 18 (!) 22 16  Temp: 98.1 F (36.7 C)     TempSrc: Oral     SpO2: 100% 99% 99% 98%   Weight: 200 lb (90.7 kg)     Height: 5\' 7"  (1.702 m)       Start Time: 1243 hrs. End Time: 1248 hrs. Materials:  Needle(s) Type: Epidural needle Gauge: 17G Length: 3.5-in Medication(s): Please see orders for medications and dosing details. 8  CC solution made of 5 cc of preservative-free saline, 2 cc of 0.2% ropivacaine, 1 cc of Decadron 10 mill grams per cc Imaging Guidance (Spinal):  Type of Imaging Technique: Fluoroscopy Guidance (Spinal) Indication(s): Assistance in needle guidance and placement for procedures requiring needle placement in or near specific anatomical locations not easily accessible without such assistance. Exposure Time: Please see nurses notes. Contrast: Before injecting any contrast, we confirmed that the patient did not have an allergy to iodine, shellfish, or radiological contrast. Once satisfactory needle placement was completed at the desired level, radiological contrast was injected. Contrast injected under live fluoroscopy. No contrast complications. See chart for type and volume of contrast used. Fluoroscopic Guidance: I was personally present during the use of fluoroscopy. "Tunnel Vision Technique" used to obtain the best possible view of the target area. Parallax error corrected before commencing the procedure. "Direction-depth-direction" technique used to introduce the needle under continuous pulsed fluoroscopy. Once target was reached, antero-posterior, oblique, and lateral fluoroscopic projection used confirm needle placement in all planes. Images permanently stored in EMR. Interpretation: I personally interpreted the imaging intraoperatively. Adequate needle placement confirmed in multiple planes. Appropriate spread of contrast into desired area was observed. No evidence of afferent or efferent intravascular uptake. No intrathecal or subarachnoid spread observed. Permanent images saved into the patient's record.  Antibiotic Prophylaxis:   Anti-infectives  (From admission, onward)   None     Indication(s): None identified  Post-operative Assessment:  Post-procedure Vital Signs:  Pulse Rate: 78 Temp: 98.1 F (36.7 C) Resp: 16 BP: (!) 174/100 SpO2: 98 %  EBL: None  Complications: No immediate post-treatment complications observed by team, or reported by patient.  Note: The patient tolerated the entire procedure well. A repeat set of vitals were taken after the procedure and the patient was kept under observation following institutional policy, for this type of procedure. Post-procedural neurological assessment was performed, showing return to baseline, prior to discharge. The patient was provided with post-procedure discharge instructions, including a section on how to identify potential problems. Should any problems arise concerning this procedure, the patient was given instructions to immediately contact us, at any time, without hesitation. In any case, we plan to contact the patient by telephone for a follow-up status report regarding this interventional procedure.  Comments:  No additional relevant information.  5 out of 5 strength bilateral lower extremity: Plantar flexion, dorsiflexion, knee flexion, knee extension.  Plan of Care    Imaging Orders     DG C-Arm 1-60 Min-No Report Procedure Orders    No procedure(s) ordered today    Medications ordered for procedure: Meds ordered this encounter  Medications  . iopamidol (ISOVUE-M) 41 % intrathecal injection 10 mL  . ropivacaine (PF) 2 mg/mL (0.2%) (NAROPIN) injection 2 mL  . sodium chloride flush (NS) 0.9 % injection 2 mL  . lidocaine (XYLOCAINE) 2 % (with pres) injection 200 mg  . dexamethasone (DECADRON) injection 10 mg   Medications administered: We administered iopamidol, ropivacaine (PF) 2 mg/mL (0.2%), sodium chloride flush, lidocaine, and dexamethasone.  See the medical record for exact dosing, route, and time of administration.  New Prescriptions   No  medications on file   Disposition: Discharge home  Discharge Date & Time: 03/04/2018; 1300 hrs.   Physician-requested Follow-up: Return in about 5 weeks (around 04/08/2018) for Post Procedure Evaluation.  Future Appointments  Date Time Provider [place]  03/16/2018  3:00 PM [facility]  04/09/2018  2:30 PM [name], MD [facility] None  Primary Care Physician: Glendon Axe, MD Location: [facility] Note by: [name], MD Date: 03/04/2018; Time: 1:20 PM  Disclaimer:  Medicine is not an exact science. The only guarantee in medicine is that nothing is guaranteed. It is important to note that the decision to proceed with this intervention was based on the information collected from the patient. The Data and conclusions were drawn from the patient's questionnaire, the interview, and the physical examination. Because the information was provided in large part by the patient, it cannot be guaranteed that it has not been purposely or unconsciously manipulated. Every effort has been made to obtain as much relevant data as possible for this evaluation. It is important to note that the conclusions that lead to this procedure are derived in large part from the available data. Always take into account that the treatment will also be dependent on availability of resources and existing treatment guidelines, considered by other Pain Management Practitioners as being common knowledge and practice, at the time of the intervention. For Medico-Legal purposes, it is also important to point out that variation in procedural techniques and pharmacological choices are the acceptable norm. The indications, contraindications, technique, and results of the above procedure should only be interpreted and judged by a Board-Certified Interventional Pain Specialist with extensive familiarity and expertise in the same exact procedure and technique.

## 2018-03-04 NOTE — Progress Notes (Signed)
Safety precautions to be maintained throughout the outpatient stay will include: orient to surroundings, keep bed in low position, maintain call bell within reach at all times, provide assistance with transfer out of bed and ambulation.  

## 2018-03-05 ENCOUNTER — Telehealth: Payer: Self-pay

## 2018-03-05 NOTE — Telephone Encounter (Signed)
Post procedure phone call.  LM 

## 2018-03-16 ENCOUNTER — Ambulatory Visit
Admission: RE | Admit: 2018-03-16 | Discharge: 2018-03-16 | Disposition: A | Discharge status: Home / Self Care | Payer: 59 | Source: Ambulatory Visit | Attending: Surgery | Admitting: Surgery

## 2018-03-16 DIAGNOSIS — M19012 Primary osteoarthritis, left shoulder: Secondary | ICD-10-CM | POA: Insufficient documentation

## 2018-03-16 DIAGNOSIS — M7582 Other shoulder lesions, left shoulder: Secondary | ICD-10-CM

## 2018-03-16 DIAGNOSIS — M719 Bursopathy, unspecified: Secondary | ICD-10-CM | POA: Insufficient documentation

## 2018-04-09 ENCOUNTER — Ambulatory Visit
Payer: 59 | Attending: Student in an Organized Health Care Education/Training Program | Admitting: Student in an Organized Health Care Education/Training Program

## 2018-04-09 ENCOUNTER — Encounter: Payer: Self-pay | Admitting: Student in an Organized Health Care Education/Training Program

## 2018-04-09 ENCOUNTER — Other Ambulatory Visit: Payer: Self-pay

## 2018-04-09 VITALS — BP 104/64 | HR 81 | Temp 98.3°F | Resp 16 | Ht 67.0 in | Wt 203.0 lb

## 2018-04-09 DIAGNOSIS — E039 Hypothyroidism, unspecified: Secondary | ICD-10-CM | POA: Insufficient documentation

## 2018-04-09 DIAGNOSIS — M48061 Spinal stenosis, lumbar region without neurogenic claudication: Secondary | ICD-10-CM | POA: Diagnosis not present

## 2018-04-09 DIAGNOSIS — G8929 Other chronic pain: Secondary | ICD-10-CM | POA: Diagnosis not present

## 2018-04-09 DIAGNOSIS — Z886 Allergy status to analgesic agent status: Secondary | ICD-10-CM | POA: Insufficient documentation

## 2018-04-09 DIAGNOSIS — M4807 Spinal stenosis, lumbosacral region: Secondary | ICD-10-CM | POA: Diagnosis not present

## 2018-04-09 DIAGNOSIS — E538 Deficiency of other specified B group vitamins: Secondary | ICD-10-CM | POA: Diagnosis not present

## 2018-04-09 DIAGNOSIS — I10 Essential (primary) hypertension: Secondary | ICD-10-CM | POA: Insufficient documentation

## 2018-04-09 DIAGNOSIS — M5416 Radiculopathy, lumbar region: Secondary | ICD-10-CM | POA: Diagnosis not present

## 2018-04-09 DIAGNOSIS — Z79890 Hormone replacement therapy (postmenopausal): Secondary | ICD-10-CM | POA: Diagnosis not present

## 2018-04-09 DIAGNOSIS — G4733 Obstructive sleep apnea (adult) (pediatric): Secondary | ICD-10-CM | POA: Insufficient documentation

## 2018-04-09 DIAGNOSIS — M19011 Primary osteoarthritis, right shoulder: Secondary | ICD-10-CM | POA: Insufficient documentation

## 2018-04-09 DIAGNOSIS — M5116 Intervertebral disc disorders with radiculopathy, lumbar region: Secondary | ICD-10-CM | POA: Insufficient documentation

## 2018-04-09 DIAGNOSIS — M5442 Lumbago with sciatica, left side: Secondary | ICD-10-CM

## 2018-04-09 DIAGNOSIS — Z885 Allergy status to narcotic agent status: Secondary | ICD-10-CM | POA: Insufficient documentation

## 2018-04-09 DIAGNOSIS — M19012 Primary osteoarthritis, left shoulder: Secondary | ICD-10-CM | POA: Diagnosis not present

## 2018-04-09 DIAGNOSIS — Z888 Allergy status to other drugs, medicaments and biological substances status: Secondary | ICD-10-CM | POA: Insufficient documentation

## 2018-04-09 DIAGNOSIS — Z96652 Presence of left artificial knee joint: Secondary | ICD-10-CM | POA: Insufficient documentation

## 2018-04-09 DIAGNOSIS — Z79899 Other long term (current) drug therapy: Secondary | ICD-10-CM | POA: Diagnosis not present

## 2018-04-09 NOTE — Patient Instructions (Signed)
GENERAL RISKS AND COMPLICATIONS  What are the risk, side effects and possible complications? Generally speaking, most procedures are safe.  However, with any procedure there are risks, side effects, and the possibility of complications.  The risks and complications are dependent upon the sites that are lesioned, or the type of nerve block to be performed.  The closer the procedure is to the spine, the more serious the risks are.  Great care is taken when placing the radio frequency needles, block needles or lesioning probes, but sometimes complications can occur. 1. Infection: Any time there is an injection through the skin, there is a risk of infection.  This is why sterile conditions are used for these blocks.  There are four possible types of infection. 1. Localized skin infection. 2. Central Nervous System Infection-This can be in the form of Meningitis, which can be deadly. 3. Epidural Infections-This can be in the form of an epidural abscess, which can cause pressure inside of the spine, causing compression of the spinal cord with subsequent paralysis. This would require an emergency surgery to decompress, and there are no guarantees that the patient would recover from the paralysis. 4. Discitis-This is an infection of the intervertebral discs.  It occurs in about 1% of discography procedures.  It is difficult to treat and it may lead to surgery.        2. Pain: the needles have to go through skin and soft tissues, will cause soreness.       3. Damage to internal structures:  The nerves to be lesioned may be near blood vessels or    other nerves which can be potentially damaged.       4. Bleeding: Bleeding is more common if the patient is taking blood thinners such as  aspirin, Coumadin, Ticiid, Plavix, etc., or if he/she have some genetic predisposition  such as hemophilia. Bleeding into the spinal canal can cause compression of the spinal  cord with subsequent paralysis.  This would require an  emergency surgery to  decompress and there are no guarantees that the patient would recover from the  paralysis.       5. Pneumothorax:  Puncturing of a lung is a possibility, every time a needle is introduced in  the area of the chest or upper back.  Pneumothorax refers to free air around the  collapsed lung(s), inside of the thoracic cavity (chest cavity).  Another two possible  complications related to a similar event would include: Hemothorax and Chylothorax.   These are variations of the Pneumothorax, where instead of air around the collapsed  lung(s), you may have blood or chyle, respectively.       6. Spinal headaches: They may occur with any procedures in the area of the spine.       7. Persistent CSF (Cerebro-Spinal Fluid) leakage: This is a rare problem, but may occur  with prolonged intrathecal or epidural catheters either due to the formation of a fistulous  track or a dural tear.       8. Nerve damage: By working so close to the spinal cord, there is always a possibility of  nerve damage, which could be as serious as a permanent spinal cord injury with  paralysis.       9. Death:  Although rare, severe deadly allergic reactions known as "Anaphylactic  reaction" can occur to any of the medications used.      10. Worsening of the symptoms:  We can always make thing worse.    What are the chances of something like this happening? Chances of any of this occuring are extremely low.  By statistics, you have more of a chance of getting killed in a motor vehicle accident: while driving to the hospital than any of the above occurring .  Nevertheless, you should be aware that they are possibilities.  In general, it is similar to taking a shower.  Everybody knows that you can slip, hit your head and get killed.  Does that mean that you should not shower again?  Nevertheless always keep in mind that statistics do not mean anything if you happen to be on the wrong side of them.  Even if a procedure has a 1  (one) in a 1,000,000 (million) chance of going wrong, it you happen to be that one..Also, keep in mind that by statistics, you have more of a chance of having something go wrong when taking medications.  Who should not have this procedure? If you are on a blood thinning medication (e.g. Coumadin, Plavix, see list of "Blood Thinners"), or if you have an active infection going on, you should not have the procedure.  If you are taking any blood thinners, please inform your physician.  How should I prepare for this procedure?  Do not eat or drink anything at least six hours prior to the procedure.  Bring a driver with you .  It cannot be a taxi.  Come accompanied by an adult that can drive you back, and that is strong enough to help you if your legs get weak or numb from the local anesthetic.  Take all of your medicines the morning of the procedure with just enough water to swallow them.  If you have diabetes, make sure that you are scheduled to have your procedure done first thing in the morning, whenever possible.  If you have diabetes, take only half of your insulin dose and notify our nurse that you have done so as soon as you arrive at the clinic.  If you are diabetic, but only take blood sugar pills (oral hypoglycemic), then do not take them on the morning of your procedure.  You may take them after you have had the procedure.  Do not take aspirin or any aspirin-containing medications, at least eleven (11) days prior to the procedure.  They may prolong bleeding.  Wear loose fitting clothing that may be easy to take off and that you would not mind if it got stained with Betadine or blood.  Do not wear any jewelry or perfume  Remove any nail coloring.  It will interfere with some of our monitoring equipment.  NOTE: Remember that this is not meant to be interpreted as a complete list of all possible complications.  Unforeseen problems may occur.  BLOOD THINNERS The following drugs  contain aspirin or other products, which can cause increased bleeding during surgery and should not be taken for 2 weeks prior to and 1 week after surgery.  If you should need take something for relief of minor pain, you may take acetaminophen which is found in Tylenol,m Datril, Anacin-3 and Panadol. It is not blood thinner. The products listed below are.  Do not take any of the products listed below in addition to any listed on your instruction sheet.  A.P.C or A.P.C with Codeine Codeine Phosphate Capsules #3 Ibuprofen Ridaura  ABC compound Congesprin Imuran rimadil  Advil Cope Indocin Robaxisal  Alka-Seltzer Effervescent Pain Reliever and Antacid Coricidin or Coricidin-D  Indomethacin Rufen    Alka-Seltzer plus Cold Medicine Cosprin Ketoprofen S-A-C Tablets  Anacin Analgesic Tablets or Capsules Coumadin Korlgesic Salflex  Anacin Extra Strength Analgesic tablets or capsules CP-2 Tablets Lanoril Salicylate  Anaprox Cuprimine Capsules Levenox Salocol  Anexsia-D Dalteparin Magan Salsalate  Anodynos Darvon compound Magnesium Salicylate Sine-off  Ansaid Dasin Capsules Magsal Sodium Salicylate  Anturane Depen Capsules Marnal Soma  APF Arthritis pain formula Dewitt's Pills Measurin Stanback  Argesic Dia-Gesic Meclofenamic Sulfinpyrazone  Arthritis Bayer Timed Release Aspirin Diclofenac Meclomen Sulindac  Arthritis pain formula Anacin Dicumarol Medipren Supac  Analgesic (Safety coated) Arthralgen Diffunasal Mefanamic Suprofen  Arthritis Strength Bufferin Dihydrocodeine Mepro Compound Suprol  Arthropan liquid Dopirydamole Methcarbomol with Aspirin Synalgos  ASA tablets/Enseals Disalcid Micrainin Tagament  Ascriptin Doan's Midol Talwin  Ascriptin A/D Dolene Mobidin Tanderil  Ascriptin Extra Strength Dolobid Moblgesic Ticlid  Ascriptin with Codeine Doloprin or Doloprin with Codeine Momentum Tolectin  Asperbuf Duoprin Mono-gesic Trendar  Aspergum Duradyne Motrin or Motrin IB Triminicin  Aspirin  plain, buffered or enteric coated Durasal Myochrisine Trigesic  Aspirin Suppositories Easprin Nalfon Trillsate  Aspirin with Codeine Ecotrin Regular or Extra Strength Naprosyn Uracel  Atromid-S Efficin Naproxen Ursinus  Auranofin Capsules Elmiron Neocylate Vanquish  Axotal Emagrin Norgesic Verin  Azathioprine Empirin or Empirin with Codeine Normiflo Vitamin E  Azolid Emprazil Nuprin Voltaren  Bayer Aspirin plain, buffered or children's or timed BC Tablets or powders Encaprin Orgaran Warfarin Sodium  Buff-a-Comp Enoxaparin Orudis Zorpin  Buff-a-Comp with Codeine Equegesic Os-Cal-Gesic   Buffaprin Excedrin plain, buffered or Extra Strength Oxalid   Bufferin Arthritis Strength Feldene Oxphenbutazone   Bufferin plain or Extra Strength Feldene Capsules Oxycodone with Aspirin   Bufferin with Codeine Fenoprofen Fenoprofen Pabalate or Pabalate-SF   Buffets II Flogesic Panagesic   Buffinol plain or Extra Strength Florinal or Florinal with Codeine Panwarfarin   Buf-Tabs Flurbiprofen Penicillamine   Butalbital Compound Four-way cold tablets Penicillin   Butazolidin Fragmin Pepto-Bismol   Carbenicillin Geminisyn Percodan   Carna Arthritis Reliever Geopen Persantine   Carprofen Gold's salt Persistin   Chloramphenicol Goody's Phenylbutazone   Chloromycetin Haltrain Piroxlcam   Clmetidine heparin Plaquenil   Cllnoril Hyco-pap Ponstel   Clofibrate Hydroxy chloroquine Propoxyphen         Before stopping any of these medications, be sure to consult the physician who ordered them.  Some, such as Coumadin (Warfarin) are ordered to prevent or treat serious conditions such as "deep thrombosis", "pumonary embolisms", and other heart problems.  The amount of time that you may need off of the medication may also vary with the medication and the reason for which you were taking it.  If you are taking any of these medications, please make sure you notify your pain physician before you undergo any  procedures.         Epidural Steroid Injection Patient Information  Description: The epidural space surrounds the nerves as they exit the spinal cord.  In some patients, the nerves can be compressed and inflamed by a bulging disc or a tight spinal canal (spinal stenosis).  By injecting steroids into the epidural space, we can bring irritated nerves into direct contact with a potentially helpful medication.  These steroids act directly on the irritated nerves and can reduce swelling and inflammation which often leads to decreased pain.  Epidural steroids may be injected anywhere along the spine and from the neck to the low back depending upon the location of your pain.   After numbing the skin with local anesthetic (like Novocaine), a small needle is passed   into the epidural space slowly.  You may experience a sensation of pressure while this is being done.  The entire block usually last less than 10 minutes.  Conditions which may be treated by epidural steroids:   Low back and leg pain  Neck and arm pain  Spinal stenosis  Post-laminectomy syndrome  Herpes zoster (shingles) pain  Pain from compression fractures  Preparation for the injection:  1. Do not eat any solid food or dairy products within 8 hours of your appointment.  2. You may drink clear liquids up to 3 hours before appointment.  Clear liquids include water, black coffee, juice or soda.  No milk or cream please. 3. You may take your regular medication, including pain medications, with a sip of water before your appointment  Diabetics should hold regular insulin (if taken separately) and take 1/2 normal NPH dos the morning of the procedure.  Carry some sugar containing items with you to your appointment. 4. A driver must accompany you and be prepared to drive you home after your procedure.  5. Bring all your current medications with your. 6. An IV may be inserted and sedation may be given at the discretion of the  physician.   7. A blood pressure cuff, EKG and other monitors will often be applied during the procedure.  Some patients may need to have extra oxygen administered for a short period. 8. You will be asked to provide medical information, including your allergies, prior to the procedure.  We must know immediately if you are taking blood thinners (like Coumadin/Warfarin)  Or if you are allergic to IV iodine contrast (dye). We must know if you could possible be pregnant.  Possible side-effects:  Bleeding from needle site  Infection (rare, may require surgery)  Nerve injury (rare)  Numbness & tingling (temporary)  Difficulty urinating (rare, temporary)  Spinal headache ( a headache worse with upright posture)  Light -headedness (temporary)  Pain at injection site (several days)  Decreased blood pressure (temporary)  Weakness in arm/leg (temporary)  Pressure sensation in back/neck (temporary)  Call if you experience:  Fever/chills associated with headache or increased back/neck pain.  Headache worsened by an upright position.  New onset weakness or numbness of an extremity below the injection site  Hives or difficulty breathing (go to the emergency room)  Inflammation or drainage at the infection site  Severe back/neck pain  Any new symptoms which are concerning to you  Please note:  Although the local anesthetic injected can often make your back or neck feel good for several hours after the injection, the pain will likely return.  It takes 3-7 days for steroids to work in the epidural space.  You may not notice any pain relief for at least that one week.  If effective, we will often do a series of three injections spaced 3-6 weeks apart to maximally decrease your pain.  After the initial series, we generally will wait several months before considering a repeat injection of the same type.  If you have any questions, please call [phone] [facility] 

## 2018-04-09 NOTE — Progress Notes (Signed)
Safety precautions to be maintained throughout the outpatient stay will include: orient to surroundings, keep bed in low position, maintain call bell within reach at all times, provide assistance with transfer out of bed and ambulation.  

## 2018-04-09 NOTE — Progress Notes (Signed)
Patient's Name: Erika Crawford  MRN: [id number]  Referring Provider: Glendon Axe, MD  DOB: [birth year]  PCP: Glendon Axe, MD  DOS: 04/09/2018  Note by: [name], MD  Service setting: Ambulatory outpatient  Specialty: Interventional Pain Management  Location: [facility] (AMB) Pain Management Facility    Patient type: Established   Primary Reason(s) for Visit: Encounter for post-procedure evaluation of chronic illness with mild to moderate exacerbation CC: Back Pain (lower, right)  HPI  Erika Crawford is a 77 y.o. year old, female patient, who comes today for a post-procedure evaluation. 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 Her primarily concern today is the Back Pain (lower, right)  Pain Assessment: Location: Right, Lower Back Radiating: to right hip Onset: More than a month ago Duration: Chronic pain Quality: Sharp Severity: 4 /10 (subjective, self-reported pain score)  Effect on ADL:  limits Timing: Intermittent Modifying factors: procedure BP: 104/64  HR: 81  Erika Crawford comes in today for post-procedure evaluation.  Further details on both, my assessment(s), as well as the proposed treatment plan, please see below.  Post-Procedure Assessment   03/04/2018 Procedure: L5-S1 ESI Pre-procedure pain score:  6/10 Post-procedure pain score: 6/10         Influential Factors: BMI: 31.79 kg/m Intra-procedural challenges: None observed.         Assessment challenges: None detected.              Reported side-effects: None.        Post-procedural adverse reactions or complications: None reported         Sedation: Please see nurses note. When no sedatives are used, the analgesic levels obtained are directly associated to the effectiveness of the local anesthetics. However, when sedation is provided, the level of analgesia obtained during the initial 1 hour following the intervention, is believed to be the result of a combination of factors. These factors may include, but are not limited to: 1. The effectiveness of the local anesthetics used. 2. The effects of the analgesic(s) and/or anxiolytic(s) used. 3. The degree of discomfort experienced by the patient at the time of the procedure. 4. The patients ability and reliability in recalling and recording the events. 5. The presence and influence of possible secondary gains and/or psychosocial factors. Reported result: Relief experienced during the 1st hour after the procedure: 100 % (Ultra-Short Term Relief)            Interpretative annotation: Clinically appropriate result. Analgesia during this period is likely to be Local Anesthetic and/or IV Sedative (Analgesic/Anxiolytic) related.          Effects of local anesthetic: The analgesic effects attained during this period are directly associated to the localized infiltration of local anesthetics and therefore cary significant diagnostic value as to the etiological location, or anatomical origin, of the pain. Expected duration of relief is directly dependent on the pharmacodynamics of the local anesthetic used. Long-acting (4-6 hours) anesthetics used.  Reported  result: Relief during the next 4 to 6 hour after the procedure: 100 % (Short-Term Relief)             Interpretative annotation: Clinically appropriate result. Analgesia during this period is likely to be Local Anesthetic-related.          Long-term benefit: Defined as the period of time past the expected duration of local anesthetics (1 hour for short-acting and 4-6 hours for long-acting). With the possible exception of prolonged sympathetic blockade from the local anesthetics, benefits during this period are typically attributed to, or associated with, other factors such as analgesic sensory neuropraxia, antiinflammatory effects, or beneficial biochemical changes provided by agents other than the local anesthetics.  Reported result: Extended relief following procedure: 100 %(lasted until 04/07/2018) (Long-Term Relief)            Interpretative annotation: Clinically possible results. Good relief. No permanent benefit expected. Inflammation plays a part in the etiology to the pain.          Current benefits: Defined as reported results that persistent at this point in time.   Analgesia: 75-100 %            Function: [name] reports improvement in function ROM: [name] reports improvement in ROM Interpretative annotation: Ongoing benefit. Therapeutic benefit observed. Effective therapeutic approach.          Interpretation: Results would suggest a successful therapeutic intervention.                  Plan:  Set up procedure as a PRN palliative treatment option for this patient.                Laboratory Chemistry  Inflammation Markers (CRP: Acute Phase) (ESR: Chronic Phase) No results found for: CRP, ESRSEDRATE, LATICACIDVEN                       Rheumatology Markers No results found for: RF, ANA, LABURIC, URICUR, LYMEIGGIGMAB, LYMEABIGMQN, HLAB27                      Renal Function Markers Lab Results  Component Value Date   BUN 13 04/15/2016   CREATININE 0.64 04/15/2016   GFRAA >60 04/15/2016   GFRNONAA >60 04/15/2016                             Hepatic Function  Markers Lab Results  Component Value Date   AST 16 01/19/2014   ALT 25 01/19/2014   ALBUMIN 3.6 01/19/2014   ALKPHOS 62 01/19/2014                        Electrolytes Lab Results  Component Value Date   NA 136 04/15/2016   K 3.3 (L) 04/15/2016   CL 101 04/15/2016   CALCIUM 9.5 04/15/2016                        Neuropathy Markers No results found for: VITAMINB12, FOLATE, HGBA1C, HIV                      CNS Tests No results found for: COLORCSF, APPEARCSF, RBCCOUNTCSF, WBCCSF, POLYSCSF, LYMPHSCSF, EOSCSF, PROTEINCSF, GLUCCSF, JCVIRUS, CSFOLI, IGGCSF                      Bone Pathology Markers No results  found for: Marveen Reeks, [id number], [id number], 25OHVITD1, 25OHVITD2, 25OHVITD3, TESTOFREE, TESTOSTERONE                       Coagulation Parameters Lab Results  Component Value Date   INR 1.06 04/15/2016   LABPROT 13.8 04/15/2016   APTT 30 04/15/2016   PLT 264 04/15/2016                        Cardiovascular Markers Lab Results  Component Value Date   TROPONINI <0.03 11/01/2014   HGB 12.3 04/15/2016   HCT 37.6 04/15/2016                         CA Markers No results found for: CEA, CA125, LABCA2                      Note: Lab results reviewed.  Recent Diagnostic Imaging Results  MR SHOULDER LEFT WO CONTRAST CLINICAL DATA:  Left shoulder and arm pain for 5-6 weeks. No known injury.  EXAM: MRI OF THE LEFT SHOULDER WITHOUT CONTRAST  TECHNIQUE: Multiplanar, multisequence MR imaging of the shoulder was performed. No intravenous contrast was administered.  COMPARISON:  None.  FINDINGS: Rotator cuff: Heterogeneously increased T2 signal in the supraspinatus and infraspinatus tendons is consistent with tendinopathy. An undersurface tear of the far lateral supraspinatus measures 0.2 cm from front to back. There is no full-thickness rotator cuff tear or tendon retraction.  Muscles:  Normal without atrophy or focal lesion.  Biceps long head:  Intact  and normal in appearance.  Acromioclavicular Joint: Bulky degenerative change is present. Type 2 acromion. Small volume of fluid is seen in the subacromial/subdeltoid bursa.  Glenohumeral Joint: Appears normal.  Labrum:  Intact.  Bones:  No fracture or worrisome lesion.  Other: None.  IMPRESSION: Supraspinatus and infraspinatus tendinopathy with a 0.2 cm undersurface tear of the far lateral supraspinatus.  Bulky acromioclavicular osteoarthritis.  Small volume of subacromial/subdeltoid fluid compatible with bursitis.  Electronically Signed   By: Inge Rise M.D.   On: 03/16/2018 15:17  Complexity Note: Imaging results reviewed. Results shared with Ms. Cavey, using Layman's terms.                         Meds   Current Outpatient Medications:  .  acetaminophen (TYLENOL) 500 MG tablet, Take 500 mg by mouth every 6 (six) hours as needed., Disp: , Rfl:  .  alendronate (FOSAMAX) 70 MG tablet, Take 70 mg by mouth once a week. Take with a full glass of water on an empty stomach., Disp: , Rfl:  .  amLODipine (NORVASC) 5 MG tablet, Take 50 mg by mouth daily., Disp: , Rfl: 3 .  B Complex Vitamins (VITAMIN B COMPLEX PO), Take 1 tablet by mouth daily., Disp: , Rfl:  .  Calcium Carbonate-Vitamin D (CALTRATE 600+D) 600-400 MG-UNIT tablet, Take 1 tablet by mouth 2 (two) times daily., Disp: , Rfl:  .  cyclobenzaprine (FLEXERIL) 5 MG tablet, Take 1-2 tablets by mouth 3 (three) times daily., Disp: , Rfl:  .  diclofenac (VOLTAREN) 50 MG EC tablet, Take 50 mg by mouth 2 (two) times daily., Disp: , Rfl:  .  levothyroxine (SYNTHROID, LEVOTHROID) 100 MCG tablet, Take 100 mcg by mouth daily before breakfast., Disp: , Rfl:  .  meloxicam (MOBIC) 7.5 MG tablet, Take 7.5 mg by mouth 2 (two) times  daily., Disp: , Rfl:  .  mometasone (NASONEX) 50 MCG/ACT nasal spray, Place 2 sprays into the nose daily., Disp: , Rfl:  .  olmesartan (BENICAR) 40 MG tablet, Take 40 mg by mouth daily., Disp: , Rfl:  .   omeprazole (PRILOSEC) 20 MG capsule, Take 20 mg by mouth daily., Disp: , Rfl:  .  senna-docusate (SENOKOT-S) 8.6-50 MG tablet, Take 1 tablet by mouth 3 times/day as needed-between meals & bedtime., Disp: , Rfl:  .  valsartan (DIOVAN) 320 MG tablet, Take 320 mg by mouth daily., Disp: , Rfl:  .  VITAMIN D, ERGOCALCIFEROL, PO, Take by mouth 2 (two) times daily., Disp: , Rfl:  .  amLODipine-valsartan (EXFORGE) 5-320 MG tablet, Take 1 tablet by mouth daily., Disp: , Rfl:  .  diclofenac sodium (VOLTAREN) 1 % GEL, Apply topically 3 (three) times daily., Disp: , Rfl:  .  tapentadol (NUCYNTA) 50 MG tablet, Take 1 tablet (50 mg total) by mouth every 4 (four) hours as needed for moderate pain. (Patient not taking: Reported on 03/04/2018), Disp: 30 tablet, Rfl: 0  ROS  Constitutional: Denies any fever or chills Gastrointestinal: No reported hemesis, hematochezia, vomiting, or acute GI distress Musculoskeletal: Denies any acute onset joint swelling, redness, loss of ROM, or weakness Neurological: No reported episodes of acute onset apraxia, aphasia, dysarthria, agnosia, amnesia, paralysis, loss of coordination, or loss of consciousness  Allergies  Ms. Kallio is allergic to celebrex [celecoxib]; codeine; etodolac; hydrochlorothiazide; oxycodone; pravastatin; relafen [nabumetone]; and tramadol.  PFSH  Drug: Ms. Calabria  reports that she does not use drugs. Alcohol:  reports that she drinks alcohol. Tobacco:  reports that she has never smoked. She has never used smokeless tobacco. Medical:  has a past medical history of Arthritis, Complication of anesthesia, Hypertension, Hypothyroidism, Sleep apnea, and Thyroid disease. Surgical: Ms. Graff  has a past surgical history that includes Abdominal hysterectomy; Colonoscopy with propofol (N/A, 09/21/2015); Back surgery; Total knee arthroplasty (Left, 03/21/2016); and Joint replacement. Family: family history is not on file.  Constitutional Exam  General appearance:  Well nourished, well developed, and well hydrated. In no apparent acute distress Vitals:   04/09/18 1302  BP: 104/64  Pulse: 81  Resp: 16  Temp: 98.3 F (36.8 C)  TempSrc: Oral  SpO2: 100%  Weight: 203 lb (92.1 kg)  Height: '5\' 7"'  (1.702 m)   BMI Assessment: Estimated body mass index is 31.79 kg/m as calculated from the following:   Height as of this encounter: '5\' 7"'  (1.702 m).   Weight as of this encounter: 203 lb (92.1 kg).  BMI interpretation table: BMI level Category Range association with higher incidence of chronic pain  <18 kg/m2 Underweight   18.5-24.9 kg/m2 Ideal body weight   25-29.9 kg/m2 Overweight Increased incidence by 20%  30-34.9 kg/m2 Obese (Class I) Increased incidence by 68%  35-39.9 kg/m2 Severe obesity (Class II) Increased incidence by 136%  >40 kg/m2 Extreme obesity (Class III) Increased incidence by 254%   Patient's current BMI Ideal Body weight  Body mass index is 31.79 kg/m. Ideal body weight: 61.6 kg (135 lb 12.9 oz) Adjusted ideal body weight: 73.8 kg (162 lb 10.9 oz)   BMI Readings from Last 4 Encounters:  04/09/18 31.79 kg/m  03/04/18 31.32 kg/m  02/02/18 31.32 kg/m  10/22/17 32.26 kg/m   Wt Readings from Last 4 Encounters:  04/09/18 203 lb (92.1 kg)  03/04/18 200 lb (90.7 kg)  02/02/18 200 lb (90.7 kg)  10/22/17 206 lb (93.4 kg)  Psych/Mental  status: Alert, oriented x 3 (person, place, & time)       Eyes: PERLA Respiratory: No evidence of acute respiratory distress  Cervical Spine Area Exam  Skin & Axial Inspection: No masses, redness, edema, swelling, or associated skin lesions Alignment: Symmetrical Functional ROM: Unrestricted ROM      Stability: No instability detected Muscle Tone/Strength: Functionally intact. No obvious neuro-muscular anomalies detected. Sensory (Neurological): Unimpaired Palpation: No palpable anomalies              Upper Extremity (UE) Exam    Side: Right upper extremity  Side: Left upper extremity   Skin & Extremity Inspection: Skin color, temperature, and hair growth are WNL. No peripheral edema or cyanosis. No masses, redness, swelling, asymmetry, or associated skin lesions. No contractures.  Skin & Extremity Inspection: Skin color, temperature, and hair growth are WNL. No peripheral edema or cyanosis. No masses, redness, swelling, asymmetry, or associated skin lesions. No contractures.  Functional ROM: Unrestricted ROM          Functional ROM: Unrestricted ROM          Muscle Tone/Strength: Functionally intact. No obvious neuro-muscular anomalies detected.  Muscle Tone/Strength: Functionally intact. No obvious neuro-muscular anomalies detected.  Sensory (Neurological): Unimpaired          Sensory (Neurological): Unimpaired          Palpation: No palpable anomalies              Palpation: No palpable anomalies              Provocative Test(s):  Phalen's test: deferred Tinel's test: deferred Apley's scratch test (touch opposite shoulder):  Action 1 (Across chest): deferred Action 2 (Overhead): deferred Action 3 (LB reach): deferred   Provocative Test(s):  Phalen's test: deferred Tinel's test: deferred Apley's scratch test (touch opposite shoulder):  Action 1 (Across chest): deferred Action 2 (Overhead): deferred Action 3 (LB reach): deferred    Thoracic Spine Area Exam  Skin & Axial Inspection: No masses, redness, or swelling Alignment: Symmetrical Functional ROM: Unrestricted ROM Stability: No instability detected Muscle Tone/Strength: Functionally intact. No obvious neuro-muscular anomalies detected. Sensory (Neurological): Unimpaired Muscle strength & Tone: No palpable anomalies  Lumbar Spine Area Exam  Skin & Axial Inspection: No masses, redness, or swelling Alignment: Symmetrical Functional ROM: Improved after treatment       Stability: No instability detected Muscle Tone/Strength: Functionally intact. No obvious neuro-muscular anomalies detected. Sensory  (Neurological): Improved Palpation: No palpable anomalies       Provocative Tests: Hyperextension/rotation test: Improved after treatment       Lumbar quadrant test (Kemp's test): Improved after treatment       Lateral bending test: deferred today       Patrick's Maneuver: deferred today                   FABER test: deferred today                   S-I anterior distraction/compression test: deferred today         S-I lateral compression test: deferred today         S-I Thigh-thrust test: deferred today         S-I Gaenslen's test: deferred today          Gait & Posture Assessment  Ambulation: Unassisted Gait: Relatively normal for age and body habitus Posture: WNL   Lower Extremity Exam    Side: Right lower extremity  Side: Left lower  extremity  Stability: No instability observed          Stability: No instability observed          Skin & Extremity Inspection: Skin color, temperature, and hair growth are WNL. No peripheral edema or cyanosis. No masses, redness, swelling, asymmetry, or associated skin lesions. No contractures.  Skin & Extremity Inspection: Skin color, temperature, and hair growth are WNL. No peripheral edema or cyanosis. No masses, redness, swelling, asymmetry, or associated skin lesions. No contractures.  Functional ROM: Unrestricted ROM                  Functional ROM: Unrestricted ROM                  Muscle Tone/Strength: Functionally intact. No obvious neuro-muscular anomalies detected.  Muscle Tone/Strength: Functionally intact. No obvious neuro-muscular anomalies detected.  Sensory (Neurological): Unimpaired  Sensory (Neurological): Unimpaired  Palpation: No palpable anomalies  Palpation: No palpable anomalies   Assessment  Primary Diagnosis & Pertinent Problem List: The primary encounter diagnosis was Lumbar radiculopathy. Diagnoses of Lumbar foraminal stenosis and Chronic left-sided low back pain with left-sided sciatica were also pertinent to this  visit.  Status Diagnosis  Controlled Controlled Controlled 1. Lumbar radiculopathy   2. Lumbar foraminal stenosis   3. Chronic left-sided low back pain with left-sided sciatica     General Recommendations: The pain condition that the patient suffers from is best treated with a multidisciplinary approach that involves an increase in physical activity to prevent de-conditioning and worsening of the pain cycle, as well as psychological counseling (formal and/or informal) to address the co-morbid psychological affects of pain. Treatment will often involve judicious use of pain medications and interventional procedures to decrease the pain, allowing the patient to participate in the physical activity that will ultimately produce long-lasting pain reductions. The goal of the multidisciplinary approach is to return the patient to a higher level of overall function and to restore their ability to perform activities of daily living.  77 year old female with history of lumbar radiculopathy who follows up status post L5-S1 ESI performed on 03/04/2018.  Patient endorses significant benefit after this epidural and improvement in her range of motion as well as functional status.  Patient has had 4 total epidurals since October 2018.  Patient is not a diabetic.  I would like to limit her neuraxial steroid exposure to no more than 3-4 times a year.  Given that the patient does experience significant pain relief as well as functional status improvement with lumbar epidural steroid injections, it is reasonable to continue these as needed every 3 to 4 months.  Patient has been counseled on the risk of chronic steroid therapy.  Given that she is not a diabetic these risks are much lower for her.  Plan: -PRN R L5-S1 lumbar ESI (after May 17, 2018 )  Plan of Care  Pharmacotherapy (Medications Ordered): No orders of the defined types were placed in this encounter.  Lab-work, procedure(s), and/or referral(s): Orders  Placed This Encounter  Procedures  . Lumbar Epidural Injection   PRN Procedures:   L-ESI   Time Note: Greater than 50% of the 25 minute(s) of face-to-face time spent with Ms. Dangerfield, was spent in counseling/coordination of care regarding: Ms. Maroney primary cause of pain, the treatment plan, treatment alternatives, the risks and possible complications of proposed treatment, going over the informed consent, realistic expectations and the goals of pain management (increased in functionality).  Future Appointments  Date Time Provider Department  Center  04/09/2018  2:30 PM [name], MD [facility] None    Primary Care Physician: Glendon Axe, MD Location: [facility] Note by: [name], M.D Date: 04/09/2018; Time: 1:47 PM  Patient Instructions   GENERAL RISKS AND COMPLICATIONS  What are the risk, side effects and possible complications? Generally speaking, most procedures are safe.  However, with any procedure there are risks, side effects, and the possibility of complications.  The risks and complications are dependent upon the sites that are lesioned, or the type of nerve block to be performed.  The closer the procedure is to the spine, the more serious the risks are.  Great care is taken when placing the radio frequency needles, block needles or lesioning probes, but sometimes complications can occur. 1. Infection: Any time there is an injection through the skin, there is a risk of infection.  This is why sterile conditions are used for these blocks.  There are four possible types of infection. 1. Localized skin infection. 2. Central Nervous System Infection-This can be in the form of Meningitis, which can be deadly. 3. Epidural Infections-This can be in the form of an epidural abscess, which can cause pressure inside of the spine, causing compression of the spinal cord with subsequent paralysis. This would require an emergency surgery to decompress,  and there are no guarantees that the patient would recover from the paralysis. 4. Discitis-This is an infection of the intervertebral discs.  It occurs in about 1% of discography procedures.  It is difficult to treat and it may lead to surgery.        2. Pain: the needles have to go through skin and soft tissues, will cause soreness.       3. Damage to internal structures:  The nerves to be lesioned may be near blood vessels or    other nerves which can be potentially damaged.       4. Bleeding: Bleeding is more common if the patient is taking blood thinners such as  aspirin, Coumadin, Ticiid, Plavix, etc., or if he/she have some genetic predisposition  such as hemophilia. Bleeding into the spinal canal can cause compression of the spinal  cord with subsequent paralysis.  This would require an emergency surgery to  decompress and there are no guarantees that the patient would recover from the  paralysis.       5. Pneumothorax:  Puncturing of a lung is a possibility, every time a needle is introduced in  the area of the chest or upper back.  Pneumothorax refers to free air around the  collapsed lung(s), inside of the thoracic cavity (chest cavity).  Another two possible  complications related to a similar event would include: Hemothorax and Chylothorax.   These are variations of the Pneumothorax, where instead of air around the collapsed  lung(s), you may have blood or chyle, respectively.       6. Spinal headaches: They may occur with any procedures in the area of the spine.       7. Persistent CSF (Cerebro-Spinal Fluid) leakage: This is a rare problem, but may occur  with prolonged intrathecal or epidural catheters either due to the formation of a fistulous  track or a dural tear.       8. Nerve damage: By working so close to the spinal cord, there is always a possibility of  nerve damage, which could be as serious as a permanent spinal cord injury with  paralysis.  9. Death:  Although rare, severe  deadly allergic reactions known as "Anaphylactic  reaction" can occur to any of the medications used.      10. Worsening of the symptoms:  We can always make thing worse.  What are the chances of something like this happening? Chances of any of this occuring are extremely low.  By statistics, you have more of a chance of getting killed in a motor vehicle accident: while driving to the hospital than any of the above occurring .  Nevertheless, you should be aware that they are possibilities.  In general, it is similar to taking a shower.  Everybody knows that you can slip, hit your head and get killed.  Does that mean that you should not shower again?  Nevertheless always keep in mind that statistics do not mean anything if you happen to be on the wrong side of them.  Even if a procedure has a 1 (one) in a 1,000,000 (million) chance of going wrong, it you happen to be that one..Also, keep in mind that by statistics, you have more of a chance of having something go wrong when taking medications.  Who should not have this procedure? If you are on a blood thinning medication (e.g. Coumadin, Plavix, see list of "Blood Thinners"), or if you have an active infection going on, you should not have the procedure.  If you are taking any blood thinners, please inform your physician.  How should I prepare for this procedure?  Do not eat or drink anything at least six hours prior to the procedure.  Bring a driver with you .  It cannot be a taxi.  Come accompanied by an adult that can drive you back, and that is strong enough to help you if your legs get weak or numb from the local anesthetic.  Take all of your medicines the morning of the procedure with just enough water to swallow them.  If you have diabetes, make sure that you are scheduled to have your procedure done first thing in the morning, whenever possible.  If you have diabetes, take only half of your insulin dose and notify our nurse that you have  done so as soon as you arrive at the clinic.  If you are diabetic, but only take blood sugar pills (oral hypoglycemic), then do not take them on the morning of your procedure.  You may take them after you have had the procedure.  Do not take aspirin or any aspirin-containing medications, at least eleven (11) days prior to the procedure.  They may prolong bleeding.  Wear loose fitting clothing that may be easy to take off and that you would not mind if it got stained with Betadine or blood.  Do not wear any jewelry or perfume  Remove any nail coloring.  It will interfere with some of our monitoring equipment.  NOTE: Remember that this is not meant to be interpreted as a complete list of all possible complications.  Unforeseen problems may occur.  BLOOD THINNERS The following drugs contain aspirin or other products, which can cause increased bleeding during surgery and should not be taken for 2 weeks prior to and 1 week after surgery.  If you should need take something for relief of minor pain, you may take acetaminophen which is found in Tylenol,m Datril, Anacin-3 and Panadol. It is not blood thinner. The products listed below are.  Do not take any of the products listed below in addition to any listed on  your instruction sheet.  A.P.C or A.P.C with Codeine Codeine Phosphate Capsules #3 Ibuprofen Ridaura  ABC compound Congesprin Imuran rimadil  Advil Cope Indocin Robaxisal  Alka-Seltzer Effervescent Pain Reliever and Antacid Coricidin or Coricidin-D  Indomethacin Rufen  Alka-Seltzer plus Cold Medicine Cosprin Ketoprofen S-A-C Tablets  Anacin Analgesic Tablets or Capsules Coumadin Korlgesic Salflex  Anacin Extra Strength Analgesic tablets or capsules CP-2 Tablets Lanoril Salicylate  Anaprox Cuprimine Capsules Levenox Salocol  Anexsia-D Dalteparin Magan Salsalate  Anodynos Darvon compound Magnesium Salicylate Sine-off  Ansaid Dasin Capsules Magsal Sodium Salicylate  Anturane Depen Capsules  Marnal Soma  APF Arthritis pain formula Dewitt's Pills Measurin Stanback  Argesic Dia-Gesic Meclofenamic Sulfinpyrazone  Arthritis Bayer Timed Release Aspirin Diclofenac Meclomen Sulindac  Arthritis pain formula Anacin Dicumarol Medipren Supac  Analgesic (Safety coated) Arthralgen Diffunasal Mefanamic Suprofen  Arthritis Strength Bufferin Dihydrocodeine Mepro Compound Suprol  Arthropan liquid Dopirydamole Methcarbomol with Aspirin Synalgos  ASA tablets/Enseals Disalcid Micrainin Tagament  Ascriptin Doan's Midol Talwin  Ascriptin A/D Dolene Mobidin Tanderil  Ascriptin Extra Strength Dolobid Moblgesic Ticlid  Ascriptin with Codeine Doloprin or Doloprin with Codeine Momentum Tolectin  Asperbuf Duoprin Mono-gesic Trendar  Aspergum Duradyne Motrin or Motrin IB Triminicin  Aspirin plain, buffered or enteric coated Durasal Myochrisine Trigesic  Aspirin Suppositories Easprin Nalfon Trillsate  Aspirin with Codeine Ecotrin Regular or Extra Strength Naprosyn Uracel  Atromid-S Efficin Naproxen Ursinus  Auranofin Capsules Elmiron Neocylate Vanquish  Axotal Emagrin Norgesic Verin  Azathioprine Empirin or Empirin with Codeine Normiflo Vitamin E  Azolid Emprazil Nuprin Voltaren  Bayer Aspirin plain, buffered or children's or timed BC Tablets or powders Encaprin Orgaran Warfarin Sodium  Buff-a-Comp Enoxaparin Orudis Zorpin  Buff-a-Comp with Codeine Equegesic Os-Cal-Gesic   Buffaprin Excedrin plain, buffered or Extra Strength Oxalid   Bufferin Arthritis Strength Feldene Oxphenbutazone   Bufferin plain or Extra Strength Feldene Capsules Oxycodone with Aspirin   Bufferin with Codeine Fenoprofen Fenoprofen Pabalate or Pabalate-SF   Buffets II Flogesic Panagesic   Buffinol plain or Extra Strength Florinal or Florinal with Codeine Panwarfarin   Buf-Tabs Flurbiprofen Penicillamine   Butalbital Compound Four-way cold tablets Penicillin   Butazolidin Fragmin Pepto-Bismol   Carbenicillin Geminisyn Percodan    Carna Arthritis Reliever Geopen Persantine   Carprofen Gold's salt Persistin   Chloramphenicol Goody's Phenylbutazone   Chloromycetin Haltrain Piroxlcam   Clmetidine heparin Plaquenil   Cllnoril Hyco-pap Ponstel   Clofibrate Hydroxy chloroquine Propoxyphen         Before stopping any of these medications, be sure to consult the physician who ordered them.  Some, such as Coumadin (Warfarin) are ordered to prevent or treat serious conditions such as "deep thrombosis", "pumonary embolisms", and other heart problems.  The amount of time that you may need off of the medication may also vary with the medication and the reason for which you were taking it.  If you are taking any of these medications, please make sure you notify your pain physician before you undergo any procedures.         Epidural Steroid Injection Patient Information  Description: The epidural space surrounds the nerves as they exit the spinal cord.  In some patients, the nerves can be compressed and inflamed by a bulging disc or a tight spinal canal (spinal stenosis).  By injecting steroids into the epidural space, we can bring irritated nerves into direct contact with a potentially helpful medication.  These steroids act directly on the irritated nerves and can reduce swelling and inflammation which often leads to decreased pain.  Epidural steroids may be injected anywhere along the spine and from the neck to the low back depending upon the location of your pain.   After numbing the skin with local anesthetic (like Novocaine), a small needle is passed into the epidural space slowly.  You may experience a sensation of pressure while this is being done.  The entire block usually last less than 10 minutes.  Conditions which may be treated by epidural steroids:   Low back and leg pain  Neck and arm pain  Spinal stenosis  Post-laminectomy syndrome  Herpes zoster (shingles) pain  Pain from compression  fractures  Preparation for the injection:  1. Do not eat any solid food or dairy products within 8 hours of your appointment.  2. You may drink clear liquids up to 3 hours before appointment.  Clear liquids include water, black coffee, juice or soda.  No milk or cream please. 3. You may take your regular medication, including pain medications, with a sip of water before your appointment  Diabetics should hold regular insulin (if taken separately) and take 1/2 normal NPH dos the morning of the procedure.  Carry some sugar containing items with you to your appointment. 4. A driver must accompany you and be prepared to drive you home after your procedure.  5. Bring all your current medications with your. 6. An IV may be inserted and sedation may be given at the discretion of the physician.   7. A blood pressure cuff, EKG and other monitors will often be applied during the procedure.  Some patients may need to have extra oxygen administered for a short period. 8. You will be asked to provide medical information, including your allergies, prior to the procedure.  We must know immediately if you are taking blood thinners (like Coumadin/Warfarin)  Or if you are allergic to IV iodine contrast (dye). We must know if you could possible be pregnant.  Possible side-effects:  Bleeding from needle site  Infection (rare, may require surgery)  Nerve injury (rare)  Numbness & tingling (temporary)  Difficulty urinating (rare, temporary)  Spinal headache ( a headache worse with upright posture)  Light -headedness (temporary)  Pain at injection site (several days)  Decreased blood pressure (temporary)  Weakness in arm/leg (temporary)  Pressure sensation in back/neck (temporary)  Call if you experience:  Fever/chills associated with headache or increased back/neck pain.  Headache worsened by an upright position.  New onset weakness or numbness of an extremity below the injection site  Hives  or difficulty breathing (go to the emergency room)  Inflammation or drainage at the infection site  Severe back/neck pain  Any new symptoms which are concerning to you  Please note:  Although the local anesthetic injected can often make your back or neck feel good for several hours after the injection, the pain will likely return.  It takes 3-7 days for steroids to work in the epidural space.  You may not notice any pain relief for at least that one week.  If effective, we will often do a series of three injections spaced 3-6 weeks apart to maximally decrease your pain.  After the initial series, we generally will wait several months before considering a repeat injection of the same type.  If you have any questions, please call [phone] [facility]

## 2018-05-27 ENCOUNTER — Ambulatory Visit: Payer: 59 | Admitting: Student in an Organized Health Care Education/Training Program

## 2018-06-01 ENCOUNTER — Ambulatory Visit
Admission: RE | Admit: 2018-06-01 | Discharge: 2018-06-01 | Disposition: A | Discharge status: Home / Self Care | Payer: 59 | Source: Ambulatory Visit | Attending: Student in an Organized Health Care Education/Training Program | Admitting: Student in an Organized Health Care Education/Training Program

## 2018-06-01 ENCOUNTER — Other Ambulatory Visit: Payer: Self-pay

## 2018-06-01 ENCOUNTER — Ambulatory Visit (HOSPITAL_BASED_OUTPATIENT_CLINIC_OR_DEPARTMENT_OTHER): Payer: 59 | Admitting: Student in an Organized Health Care Education/Training Program

## 2018-06-01 ENCOUNTER — Encounter: Payer: Self-pay | Admitting: Student in an Organized Health Care Education/Training Program

## 2018-06-01 DIAGNOSIS — M5416 Radiculopathy, lumbar region: Secondary | ICD-10-CM | POA: Diagnosis not present

## 2018-06-01 MED ORDER — IOPAMIDOL (ISOVUE-M 200) INJECTION 41%
10.0000 mL | Freq: Once | INTRAMUSCULAR | Status: AC
  Administered 2018-06-01: 10 mL via EPIDURAL
  Filled 2018-06-01: qty 10

## 2018-06-01 MED ORDER — LIDOCAINE HCL 2 % IJ SOLN
10.0000 mL | Freq: Once | INTRAMUSCULAR | Status: AC
  Administered 2018-06-01: 400 mg
  Filled 2018-06-01: qty 40

## 2018-06-01 MED ORDER — DEXAMETHASONE SODIUM PHOSPHATE 10 MG/ML IJ SOLN
10.0000 mg | Freq: Once | INTRAMUSCULAR | Status: AC
  Administered 2018-06-01: 10 mg
  Filled 2018-06-01: qty 1

## 2018-06-01 MED ORDER — SODIUM CHLORIDE 0.9% FLUSH
2.0000 mL | Freq: Once | INTRAVENOUS | Status: AC
  Administered 2018-06-01: 10 mL

## 2018-06-01 MED ORDER — SODIUM CHLORIDE (PF) 0.9 % IJ SOLN
INTRAMUSCULAR | Status: AC
  Filled 2018-06-01: qty 10

## 2018-06-01 MED ORDER — ROPIVACAINE HCL 2 MG/ML IJ SOLN
2.0000 mL | Freq: Once | INTRAMUSCULAR | Status: AC
  Administered 2018-06-01: 10 mL via EPIDURAL
  Filled 2018-06-01: qty 10

## 2018-06-01 NOTE — Progress Notes (Signed)
Patient's Name: Erika Crawford  MRN: [id number]  Referring Provider: [name], MD  DOB: 03-Mar-1941  PCP: Glendon Axe, MD  DOS: 06/01/2018  Note by: Erika Santa, MD  Service setting: Ambulatory outpatient  Specialty: Interventional Pain Management  Patient type: Established  Location: [facility] (AMB) Pain Management Facility  Visit type: Interventional Procedure   Primary Reason for Visit: Interventional Pain Management Treatment. CC: Back Pain (lower, left is worse) and Shoulder Pain (bilaterally)  Procedure:       Anesthesia, Analgesia, Anxiolysis:  Type: Therapeutic Inter-Laminar Epidural Steroid Injection  Region: Lumbar Level: L5-S1 Level. Laterality: Left-Sided         Type: Local Anesthesia Indication(s): Analgesia and Anxiety Route: Infiltration ([place]/IM) IV Access: Declined Sedation: Declined  Local Anesthetic: Lidocaine 1-2%   Indications: 1. Lumbar radiculopathy    Pain Score: Pre-procedure: 9 /10 Post-procedure: 9 /10  Pre-op Assessment:  Erika Crawford is a 77 y.o. (year old), female patient, seen today for interventional treatment. 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 Erika Crawford has a current medication list which includes the following prescription(s): alendronate, amlodipine-valsartan, levothyroxine, mometasone, olmesartan, omeprazole, senna-docusate, ergocalciferol, acetaminophen, amlodipine, b complex vitamins, calcium carbonate-vitamin d, cyclobenzaprine, diclofenac, diclofenac sodium, meloxicam, tapentadol, and valsartan. Her primarily concern today is the Back Pain (lower, left is worse) and Shoulder Pain (bilaterally)  Initial Vital Signs:  Pulse Rate: 91 Temp: 98.6 F (37 C) Resp: 18 BP: (!) 134/55 SpO2: 98 %  BMI: Estimated body mass index is 31.79 kg/m as calculated from the following:   Height as  of this encounter: 5\' 7"  (1.702 m).   Weight as of this encounter: 203 lb (92.1 kg).  Risk Assessment: Allergies: Reviewed. She is allergic to celebrex [celecoxib]; codeine; etodolac; hydrochlorothiazide; oxycodone; pravastatin; relafen [nabumetone]; and tramadol.  Allergy Precautions: None required Coagulopathies: Reviewed. None identified.  Blood-thinner therapy: None at this time Active Infection(s): Reviewed. None identified. [name] is afebrile  Site Confirmation: [name] was asked to confirm the procedure and laterality before marking the site Procedure checklist: Completed Consent: Before the procedure and under the influence of no sedative(s), amnesic(s), or anxiolytics, the patient was informed of the treatment options, risks and possible complications. To fulfill our ethical and legal obligations, as recommended by the American Medical Association's Code of Ethics, I have informed the patient of my clinical impression; the nature and purpose of the treatment or procedure; the risks, benefits, and possible complications of the intervention; the alternatives, including doing nothing; the risk(s) and benefit(s) of the alternative treatment(s) or procedure(s); and the risk(s) and benefit(s) of doing nothing. The patient was provided information about the general risks and possible complications associated with the procedure. These may include, but are not limited to: failure to achieve desired goals, infection, bleeding, organ or nerve damage, allergic reactions, paralysis, and death. In addition, the patient was informed of those risks and complications associated to Spine-related procedures, such as failure to decrease pain; infection (i.e.: Meningitis, epidural or intraspinal abscess); bleeding (i.e.: epidural hematoma, subarachnoid hemorrhage, or any other type of intraspinal or peri-dural bleeding); organ or nerve damage (i.e.: Any type of peripheral nerve, nerve root, or spinal cord  injury) with subsequent damage to sensory, motor, and/or autonomic systems, resulting in permanent pain, numbness, and/or weakness of one or several areas of the body; allergic reactions; (i.e.: anaphylactic reaction); and/or death. Furthermore, the patient was informed of those  risks and complications associated with the medications. These include, but are not limited to: allergic reactions (i.e.: anaphylactic or anaphylactoid reaction(s)); adrenal axis suppression; blood sugar elevation that in diabetics may result in ketoacidosis or comma; water retention that in patients with history of congestive heart failure may result in shortness of breath, pulmonary edema, and decompensation with resultant heart failure; weight gain; swelling or edema; medication-induced neural toxicity; particulate matter embolism and blood vessel occlusion with resultant organ, and/or nervous system infarction; and/or aseptic necrosis of one or more joints. Finally, the patient was informed that Medicine is not an exact science; therefore, there is also the possibility of unforeseen or unpredictable risks and/or possible complications that may result in a catastrophic outcome. The patient indicated having understood very clearly. We have given the patient no guarantees and we have made no promises. Enough time was given to the patient to ask questions, all of which were answered to the patient's satisfaction. Ms. Helbling has indicated that she wanted to continue with the procedure. Attestation: I, the ordering provider, attest that I have discussed with the patient the benefits, risks, side-effects, alternatives, likelihood of achieving goals, and potential problems during recovery for the procedure that I have provided informed consent. Date  Time: 06/01/2018 12:49 PM  Pre-Procedure Preparation:  Monitoring: As per clinic protocol. Respiration, ETCO2, SpO2, BP, heart rate and rhythm monitor placed and checked for adequate  function Safety Precautions: Patient was assessed for positional comfort and pressure points before starting the procedure. Time-out: I initiated and conducted the "Time-out" before starting the procedure, as per protocol. The patient was asked to participate by confirming the accuracy of the "Time Out" information. Verification of the correct person, site, and procedure were performed and confirmed by me, the nursing staff, and the patient. "Time-out" conducted as per Joint Commission's Universal Protocol (UP.01.01.01). Time: 1329  Description of Procedure:       Position: Prone with head of the table was raised to facilitate breathing. Target Area: The interlaminar space, initially targeting the lower laminar border of the superior vertebral body. Approach: Paramedial approach. Area Prepped: Entire Posterior Lumbar Region Prepping solution: ChloraPrep (2% chlorhexidine gluconate and 70% isopropyl alcohol) Safety Precautions: Aspiration looking for blood return was conducted prior to all injections. At no point did we inject any substances, as a needle was being advanced. No attempts were made at seeking any paresthesias. Safe injection practices and needle disposal techniques used. Medications properly checked for expiration dates. SDV (single dose vial) medications used. Description of the Procedure: Protocol guidelines were followed. The procedure needle was introduced through the skin, ipsilateral to the reported pain, and advanced to the target area. Bone was contacted and the needle walked caudad, until the lamina was cleared. The epidural space was identified using "loss-of-resistance technique" with 2-3 ml of PF-NaCl (0.9% NSS), in a 5cc LOR glass syringe. Vitals:   06/01/18 1325 06/01/18 1330 06/01/18 1335 06/01/18 1341  BP:    (!) (P) 159/78  Pulse:      Resp: 16 19 (!) 23 (P) 16  Temp:      TempSrc:      SpO2: 98% 97% 99% (P) 97%  Weight:      Height:        Start Time: 1329  hrs. End Time: 1336 hrs. Materials:  Needle(s) Type: Epidural needle Gauge: 17G Length: 5-in Medication(s): Please see orders for medications and dosing details. 8 CC solution made of 5 cc of preservative-free saline, 2 cc of 0.2% ropivacaine,  1 cc of Decadron 10 mill grams per cc Imaging Guidance (Spinal):  Type of Imaging Technique: Fluoroscopy Guidance (Spinal) Indication(s): Assistance in needle guidance and placement for procedures requiring needle placement in or near specific anatomical locations not easily accessible without such assistance. Exposure Time: Please see nurses notes. Contrast: Before injecting any contrast, we confirmed that the patient did not have an allergy to iodine, shellfish, or radiological contrast. Once satisfactory needle placement was completed at the desired level, radiological contrast was injected. Contrast injected under live fluoroscopy. No contrast complications. See chart for type and volume of contrast used. Fluoroscopic Guidance: I was personally present during the use of fluoroscopy. "Tunnel Vision Technique" used to obtain the best possible view of the target area. Parallax error corrected before commencing the procedure. "Direction-depth-direction" technique used to introduce the needle under continuous pulsed fluoroscopy. Once target was reached, antero-posterior, oblique, and lateral fluoroscopic projection used confirm needle placement in all planes. Images permanently stored in EMR. Interpretation: I personally interpreted the imaging intraoperatively. Adequate needle placement confirmed in multiple planes. Appropriate spread of contrast into desired area was observed. No evidence of afferent or efferent intravascular uptake. No intrathecal or subarachnoid spread observed. Permanent images saved into the patient's record.  Antibiotic Prophylaxis:   Anti-infectives (From admission, onward)   None     Indication(s): None  identified  Post-operative Assessment:  Post-procedure Vital Signs:  Pulse Rate: 91 Temp: 98.6 F (37 C) Resp: (P) 16 BP: (!) (P) 159/78 SpO2: (P) 97 %  EBL: None  Complications: No immediate post-treatment complications observed by team, or reported by patient.  Note: The patient tolerated the entire procedure well. A repeat set of vitals were taken after the procedure and the patient was kept under observation following institutional policy, for this type of procedure. Post-procedural neurological assessment was performed, showing return to baseline, prior to discharge. The patient was provided with post-procedure discharge instructions, including a section on how to identify potential problems. Should any problems arise concerning this procedure, the patient was given instructions to immediately contact us, at any time, without hesitation. In any case, we plan to contact the patient by telephone for a follow-up status report regarding this interventional procedure.  Comments:  No additional relevant information.  5 out of 5 strength bilateral lower extremity: Plantar flexion, dorsiflexion, knee flexion, knee extension.  Plan of Care    Imaging Orders     DG C-Arm 1-60 Min-No Report Procedure Orders    No procedure(s) ordered today    Medications ordered for procedure: Meds ordered this encounter  Medications  . iopamidol (ISOVUE-M) 41 % intrathecal injection 10 mL  . ropivacaine (PF) 2 mg/mL (0.2%) (NAROPIN) injection 2 mL  . sodium chloride flush (NS) 0.9 % injection 2 mL  . lidocaine (XYLOCAINE) 2 % (with pres) injection 200 mg  . dexamethasone (DECADRON) injection 10 mg   Medications administered: We administered iopamidol, ropivacaine (PF) 2 mg/mL (0.2%), sodium chloride flush, lidocaine, and dexamethasone.  See the medical record for exact dosing, route, and time of administration.  New Prescriptions   No medications on file   Disposition: Discharge home   Discharge Date & Time: 06/01/2018;   hrs.   Physician-requested Follow-up: Return in about 8 weeks (around 07/27/2018) for Post Procedure Evaluation.  Future Appointments  Date Time Provider [place]  07/28/2018  2:30 PM Erika Santa, MD [facility] None   Primary Care Physician: Glendon Axe, MD Location: [facility] Out[facility] Pain Management Facility Note by: Erika Santa, MD Date:  06/01/2018; Time: 2:35 PM  Disclaimer:  Medicine is not an Chief Strategy Officer. The only guarantee in medicine is that nothing is guaranteed. It is important to note that the decision to proceed with this intervention was based on the information collected from the patient. The Data and conclusions were drawn from the patient's questionnaire, the interview, and the physical examination. Because the information was provided in large part by the patient, it cannot be guaranteed that it has not been purposely or unconsciously manipulated. Every effort has been made to obtain as much relevant data as possible for this evaluation. It is important to note that the conclusions that lead to this procedure are derived in large part from the available data. Always take into account that the treatment will also be dependent on availability of resources and existing treatment guidelines, considered by other Pain Management Practitioners as being common knowledge and practice, at the time of the intervention. For Medico-Legal purposes, it is also important to point out that variation in procedural techniques and pharmacological choices are the acceptable norm. The indications, contraindications, technique, and results of the above procedure should only be interpreted and judged by a Board-Certified Interventional Pain Specialist with extensive familiarity and expertise in the same exact procedure and technique.

## 2018-06-01 NOTE — Progress Notes (Signed)
Safety precautions to be maintained throughout the outpatient stay will include: orient to surroundings, keep bed in low position, maintain call bell within reach at all times, provide assistance with transfer out of bed and ambulation.  

## 2018-06-02 ENCOUNTER — Telehealth: Payer: Self-pay

## 2018-06-02 NOTE — Telephone Encounter (Signed)
Post procedure phone call.  Patient states she is doing well.  

## 2018-06-11 ENCOUNTER — Telehealth: Payer: Self-pay | Admitting: *Deleted

## 2018-06-11 NOTE — Telephone Encounter (Signed)
Voicemail was left by patient stating that she had procedure on 06/01/18 and she had pain relief that day but back has been hurting since then.  Left voicemail with patient that Dr Holley Raring is not currently in office.  Rational given re; lumbar epidural and steroid and how that may work re; inflammation.  Told patient that she could try ice or heat and could take what ever she normally uses for pain.  Should keep f/up appt so that she and Dr Holley Raring could make a plan as to where the pain may be coming from and what to do next.

## 2018-06-15 ENCOUNTER — Telehealth: Payer: Self-pay

## 2018-06-15 NOTE — Telephone Encounter (Signed)
Pt left message c/o lots of back pain, nothing seems to help.Can we call with some suggestions.

## 2018-06-16 NOTE — Telephone Encounter (Signed)
Spoke with patient.  Patient states the pain she is having is the same pain as before the procedure only it seems worse.  Denies fever, incontinence or and progressive weakness.  Patient did states that she did get relief for a couple of days after procedure.  Informed patient that you were not in the office but that if she felt it necessary she could go to the ED or to her PCP.   Just FYI

## 2018-06-18 ENCOUNTER — Other Ambulatory Visit: Payer: Self-pay

## 2018-06-18 ENCOUNTER — Telehealth: Payer: Self-pay | Admitting: Student in an Organized Health Care Education/Training Program

## 2018-06-18 ENCOUNTER — Encounter
Admission: RE | Admit: 2018-06-18 | Discharge: 2018-06-18 | Disposition: A | Discharge status: Home / Self Care | Payer: 59 | Source: Ambulatory Visit | Attending: Surgery | Admitting: Surgery

## 2018-06-18 DIAGNOSIS — Z01810 Encounter for preprocedural cardiovascular examination: Secondary | ICD-10-CM | POA: Diagnosis present

## 2018-06-18 DIAGNOSIS — I10 Essential (primary) hypertension: Secondary | ICD-10-CM

## 2018-06-18 NOTE — Telephone Encounter (Signed)
Offered Toradol/Norflex injection for Monday 06-22-18. Wants to wait until after pre-admit visit to schedule this.

## 2018-06-18 NOTE — Telephone Encounter (Signed)
Patient lvmail 06-18-18 at 9:32 stating she is supposed to have shoulder surgery on Tues 06-23-18 but her back is hurting so bad she doesn't think she can do the shoulder surgery without something done for the back pain. She wants to see Dr. Holley Raring. Please call patient to discuss what she is needing

## 2018-06-18 NOTE — Patient Instructions (Signed)
Your procedure is scheduled on: Tues 06/23/18 Report to Day Surgery. To find out your arrival time please call [phone] between 1PM - 3PM on Mon 1/6 .  Remember: Instructions that are not followed completely may result in serious medical risk,  up to and including death, or upon the discretion of your surgeon and anesthesiologist your  surgery may need to be rescheduled.     _X__ 1. Do not eat food after midnight the night before your procedure.                 No gum chewing or hard candies. You may drink clear liquids up to 2 hours                 before you are scheduled to arrive for your surgery- DO not drink clear                 liquids within 2 hours of the start of your surgery.                 Clear Liquids include:  water, apple juice without pulp, clear carbohydrate                 drink such as Clearfast of Gatorade, Black Coffee or Tea (Do not add                 anything to coffee or tea).  __X__2.  On the morning of surgery brush your teeth with toothpaste and water, you                may rinse your mouth with mouthwash if you wish.  Do not swallow any toothpaste of mouthwash.     _X__ 3.  No Alcohol for 24 hours before or after surgery.   ___ 4.  Do Not Smoke or use e-cigarettes For 24 Hours Prior to Your Surgery.                 Do not use any chewable tobacco products for at least 6 hours prior to                 surgery.  ____  5.  Bring all medications with you on the day of surgery if instructed.   __x__  6.  Notify your doctor if there is any change in your medical condition      (cold, fever, infections).     Do not wear jewelry, make-up, hairpins, clips or nail polish. Do not wear lotions, powders, or perfumes. You may wear deodorant. Do not shave 48 hours prior to surgery. Men may shave face and neck. Do not bring valuables to the hospital.    [facility] is not responsible for any belongings or valuables.  Contacts, dentures  or bridgework may not be worn into surgery. Leave your suitcase in the car. After surgery it may be brought to your room. For patients admitted to the hospital, discharge time is determined by your treatment team.   Patients discharged the day of surgery will not be allowed to drive home.   Please read over the following fact sheets that you were given:    _x___ Take these medicines the morning of surgery with A SIP OF WATER:    1. amLODipine (NORVASC) 5 MG tablet  2. levothyroxine (SYNTHROID, LEVOTHROID) 88 MCG tablet  3. omeprazole (PRILOSEC) 20 MG capsule extra dose the night before and the morning of surgery  4.  5.  6.  ____ Fleet Enema (as directed)   __x__ Use CHG Soap as directed  ____ Use inhalers on the day of surgery  ____ Stop metformin 2 days prior to surgery    ____ Take 1/2 of usual insulin dose the night before surgery. No insulin the morning          of surgery.   ____ Stop Coumadin/Plavix/aspirin on   __x__ Stop Anti-inflammatories take only tylenol   ____ Stop supplements until after surgery.    _x___ Bring C-Pap to the hospital.

## 2018-06-22 ENCOUNTER — Other Ambulatory Visit: Payer: Self-pay

## 2018-06-22 ENCOUNTER — Encounter: Payer: Self-pay | Admitting: Student in an Organized Health Care Education/Training Program

## 2018-06-22 ENCOUNTER — Ambulatory Visit
Payer: 59 | Attending: Student in an Organized Health Care Education/Training Program | Admitting: Student in an Organized Health Care Education/Training Program

## 2018-06-22 VITALS — BP 158/70 | HR 72 | Temp 98.1°F | Resp 16 | Ht 67.0 in | Wt 203.0 lb

## 2018-06-22 DIAGNOSIS — G8929 Other chronic pain: Secondary | ICD-10-CM | POA: Diagnosis present

## 2018-06-22 DIAGNOSIS — M5136 Other intervertebral disc degeneration, lumbar region: Secondary | ICD-10-CM | POA: Insufficient documentation

## 2018-06-22 DIAGNOSIS — M5442 Lumbago with sciatica, left side: Secondary | ICD-10-CM | POA: Insufficient documentation

## 2018-06-22 DIAGNOSIS — M7918 Myalgia, other site: Secondary | ICD-10-CM

## 2018-06-22 DIAGNOSIS — M51369 Other intervertebral disc degeneration, lumbar region without mention of lumbar back pain or lower extremity pain: Secondary | ICD-10-CM

## 2018-06-22 MED ORDER — KETOROLAC TROMETHAMINE 30 MG/ML IJ SOLN
30.0000 mg | Freq: Once | INTRAMUSCULAR | Status: AC
  Administered 2018-06-22: 30 mg via INTRAMUSCULAR
  Filled 2018-06-22: qty 1

## 2018-06-22 MED ORDER — ORPHENADRINE CITRATE 30 MG/ML IJ SOLN
30.0000 mg | Freq: Once | INTRAMUSCULAR | Status: AC
  Administered 2018-06-22: 30 mg via INTRAMUSCULAR
  Filled 2018-06-22: qty 2

## 2018-06-22 NOTE — Progress Notes (Signed)
Patient's Name: Erika Crawford  MRN: [id number]  Referring Provider: Glendon Axe, MD  DOB: June 09, 1941  PCP: Glendon Axe, MD  DOS: 06/22/2018  Note by: [name], MD  Service setting: Ambulatory outpatient  Specialty: Interventional Pain Management  Location: [facility] (AMB) Pain Management Facility    Patient type: Established   Primary Reason(s) for Visit: Evaluation of chronic illnesses with exacerbation, or progression (Level of risk: moderate) CC: Back Pain (left, lower)  HPI  Erika Crawford is a 78 y.o. year old, female patient, who comes today for a follow-up evaluation. 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 Erika Crawford was last seen on 06/18/2018. Her primarily concern today is the Back Pain (left, lower)  Pain Assessment: Location: Left Back Radiating: right hip, pain and weakness in both legs Onset: More than a month ago Duration: Chronic pain Quality: Aching Severity: 9 /10 (subjective, self-reported pain score)  Note: Reported level is inconsistent with clinical observations.                         When using our objective Pain Scale, levels between 6 and 10/10 are said to belong in an emergency room, as it progressively  worsens from a 6/10, described as severely limiting, requiring emergency care not usually available at an outpatient pain management facility. At a 6/10 level, communication becomes difficult and requires great effort. Assistance to reach the emergency department may be required. Facial flushing and profuse sweating along with potentially dangerous increases in heart rate and blood pressure will be evident. Effect on ADL:   Timing: Constant Modifying factors: nothing BP: (!) 158/70  HR: 72  Further details on both, my assessment(s), as well as the proposed treatment plan, please see below.  Patient presents today with worsening left-sided thoracic, axial low back, left hip pain.  Also endorses weakness in both legs.  She is scheduled for left rotator cuff and shoulder surgery tomorrow however is going to postpone the surgery since her caregiver who is going to stay with her after surgery has a flu and will not be able to provide her with care postop.  Given increased pain, want to avoid steroids as the patient just received a left steroid shoulder injection on January 3.  Will treat with intramuscular Norflex and Toradol.  Laboratory Chemistry  Inflammation Markers (CRP: Acute Phase) (ESR: Chronic Phase) No results found for: CRP, ESRSEDRATE, LATICACIDVEN                       Rheumatology Markers No results found for: RF, ANA, LABURIC, URICUR, LYMEIGGIGMAB, LYMEABIGMQN, HLAB27                      Renal Function Markers Lab Results  Component Value Date   BUN 13 04/15/2016   CREATININE 0.64 04/15/2016   GFRAA >60 04/15/2016  GFRNONAA >60 04/15/2016                             Hepatic Function Markers Lab Results  Component Value Date   AST 16 01/19/2014   ALT 25 01/19/2014   ALBUMIN 3.6 01/19/2014   ALKPHOS 62 01/19/2014                        Electrolytes Lab Results  Component Value Date   NA 136 04/15/2016   K 3.3 (L) 04/15/2016   CL 101 04/15/2016   CALCIUM 9.5  04/15/2016                        Neuropathy Markers No results found for: VITAMINB12, FOLATE, HGBA1C, HIV                      CNS Tests No results found for: COLORCSF, APPEARCSF, RBCCOUNTCSF, WBCCSF, POLYSCSF, LYMPHSCSF, EOSCSF, PROTEINCSF, GLUCCSF, JCVIRUS, CSFOLI, IGGCSF                      Bone Pathology Markers No results found for: VD25OH, [id number], [id number], [id number], 25OHVITD1, 25OHVITD2, 25OHVITD3, TESTOFREE, TESTOSTERONE                       Coagulation Parameters Lab Results  Component Value Date   INR 1.06 04/15/2016   LABPROT 13.8 04/15/2016   APTT 30 04/15/2016   PLT 264 04/15/2016                        Cardiovascular Markers Lab Results  Component Value Date   TROPONINI <0.03 11/01/2014   HGB 12.3 04/15/2016   HCT 37.6 04/15/2016                         CA Markers No results found for: CEA, CA125, LABCA2                      Note: Lab results reviewed.  Recent Diagnostic Imaging Review    Shoulder-R MR wo contrast:  Results for orders placed during the hospital encounter of 01/01/17  MR SHOULDER RIGHT WO CONTRAST   Narrative CLINICAL DATA:  Right shoulder pain and decreased range of motion, chronic. No known injury.  EXAM: MRI OF THE RIGHT SHOULDER WITHOUT CONTRAST  TECHNIQUE: Multiplanar, multisequence MR imaging of the shoulder was performed. No intravenous contrast was administered.  COMPARISON:  MRI right shoulder 11/01/2015.  FINDINGS: Rotator cuff: Rotator cuff tendinopathy appearing worst in the supraspinatus is again seen. Interstitial tear of the posterior far lateral supraspinatus measures 0.5 cm from front to back. No full-thickness tear or retracted tendon. The tear is slightly larger than on the prior exam where it measured 0.4 cm.  Muscles:  No focal atrophy or lesion.  Biceps long head: Intact. Mild tendinosis of the intra-articular segment is noted.  Acromioclavicular Joint: Moderate degenerative disease is  unchanged in appearance. Type 2 Acromion. No subacromial/subdeltoid bursal fluid.  Glenohumeral Joint: Advanced degenerative disease is again seen with denuding of hyaline cartilage. Mild subchondral edema about the joint is slightly worse on the prior examination and a large osteophyte off the humeral head has slightly increased in size.  Labrum:  Degenerated without discrete tear.  Bones:  No fracture or worrisome lesion.  Other: None.  IMPRESSION: Dominant finding is advanced glenohumeral osteoarthritis which shows some worsening since the prior MRI.  Rotator cuff tendinopathy with a 0.5 cm from front to back interstitial tear of the supraspinatus at the greater tuberosity. The tear is slightly larger than on the prior exam.  Moderate acromioclavicular osteoarthritis.   Electronically Signed   By: Inge Rise M.D.   On: 01/01/2017 16:14    Shoulder-L MR wo contrast:  Results for orders placed during the hospital encounter of 03/16/18  MR SHOULDER LEFT WO CONTRAST   Narrative CLINICAL DATA:  Left shoulder and arm pain for 5-6 weeks. No known injury.  EXAM: MRI OF THE LEFT SHOULDER WITHOUT CONTRAST  TECHNIQUE: Multiplanar, multisequence MR imaging of the shoulder was performed. No intravenous contrast was administered.  COMPARISON:  None.  FINDINGS: Rotator cuff: Heterogeneously increased T2 signal in the supraspinatus and infraspinatus tendons is consistent with tendinopathy. An undersurface tear of the far lateral supraspinatus measures 0.2 cm from front to back. There is no full-thickness rotator cuff tear or tendon retraction.  Muscles:  Normal without atrophy or focal lesion.  Biceps long head:  Intact and normal in appearance.  Acromioclavicular Joint: Bulky degenerative change is present. Type 2 acromion. Small volume of fluid is seen in the subacromial/subdeltoid bursa.  Glenohumeral Joint: Appears normal.  Labrum:  Intact.  Bones:  No  fracture or worrisome lesion.  Other: None.  IMPRESSION: Supraspinatus and infraspinatus tendinopathy with a 0.2 cm undersurface tear of the far lateral supraspinatus.  Bulky acromioclavicular osteoarthritis.  Small volume of subacromial/subdeltoid fluid compatible with bursitis.   Electronically Signed   By: Inge Rise M.D.   On: 03/16/2018 15:17     Lumbosacral Imaging: Lumbar MR wo contrast:  Results for orders placed during the hospital encounter of 03/18/17  MR LUMBAR SPINE WO CONTRAST     Complexity Note: Imaging results reviewed. Results shared with Erika Crawford, using Layman's terms.                         Meds   Current Outpatient Medications:  .  alendronate (FOSAMAX) 70 MG tablet, Take 70 mg by mouth every Wednesday. Take with a full glass of water on an empty stomach. , Disp: , Rfl:  .  amLODipine (NORVASC) 5 MG tablet, Take 5 mg by mouth daily., Disp: , Rfl:  .  ASPERCREME LIDOCAINE EX, Apply 1 application topically daily as needed (pain)., Disp: , Rfl:  .  cholecalciferol (VITAMIN D3) 25 MCG (1000 UT) tablet, Take 2,000 Units by mouth daily., Disp: , Rfl:  .  Homeopathic Products [facility] RELIEF EX), Apply 1 application topically daily as needed (pain)., Disp: , Rfl:  .  levothyroxine (SYNTHROID, LEVOTHROID) 88 MCG tablet, Take 88 mcg by mouth daily before breakfast. , Disp: , Rfl:  .  mometasone (NASONEX) 50 MCG/ACT nasal spray, Place 2 sprays into the nose daily as needed (allergies). , Disp: , Rfl:  .  naproxen sodium (ALEVE) 220 MG tablet, Take 220 mg by mouth 2 (two) times daily., Disp: , Rfl:  .  olmesartan (BENICAR) 40 MG tablet, Take 40 mg by mouth daily., Disp: , Rfl:  .  omeprazole (PRILOSEC) 20 MG capsule, Take 20 mg by mouth daily., Disp: , Rfl:   ROS  Constitutional: Denies any fever or chills Gastrointestinal: No reported hemesis, hematochezia, vomiting, or acute GI distress Musculoskeletal: Denies any acute onset joint swelling,  redness, loss of ROM, or weakness Neurological:  No reported episodes of acute onset apraxia, aphasia, dysarthria, agnosia, amnesia, paralysis, loss of coordination, or loss of consciousness  Allergies  Erika Crawford is allergic to celebrex [celecoxib]; codeine; etodolac; hydrochlorothiazide; oxycodone; pravastatin; relafen [nabumetone]; and tramadol.  PFSH  Drug: Erika Crawford  reports no history of drug use. Alcohol:  reports current alcohol use. Tobacco:  reports that she has quit smoking. She has never used smokeless tobacco. Medical:  has a past medical history of Arthritis, Complication of anesthesia, Hypertension, Hypothyroidism, Sleep apnea, and Thyroid disease. Surgical: Erika Crawford  has a past surgical history that includes Abdominal hysterectomy; Colonoscopy with propofol (N/A, 09/21/2015); Back surgery; Total knee arthroplasty (Left, 03/21/2016); and Joint replacement. Family: family history is not on file.  Constitutional Exam  General appearance: Well nourished, well developed, and well hydrated. In no apparent acute distress Vitals:   06/22/18 0825  BP: (!) 158/70  Pulse: 72  Resp: 16  Temp: 98.1 F (36.7 C)  TempSrc: Oral  SpO2: 100%  Weight: 203 lb (92.1 kg)  Height: '5\' 7"'  (1.702 m)   BMI Assessment: Estimated body mass index is 31.79 kg/m as calculated from the following:   Height as of this encounter: '5\' 7"'  (1.702 m).   Weight as of this encounter: 203 lb (92.1 kg).  BMI interpretation table: BMI level Category Range association with higher incidence of chronic pain  <18 kg/m2 Underweight   18.5-24.9 kg/m2 Ideal body weight   25-29.9 kg/m2 Overweight Increased incidence by 20%  30-34.9 kg/m2 Obese (Class I) Increased incidence by 68%  35-39.9 kg/m2 Severe obesity (Class II) Increased incidence by 136%  >40 kg/m2 Extreme obesity (Class III) Increased incidence by 254%   Patient's current BMI Ideal Body weight  Body mass index is 31.79 kg/m. Ideal body weight: 61.6  kg (135 lb 12.9 oz) Adjusted ideal body weight: 73.8 kg (162 lb 10.9 oz)   BMI Readings from Last 4 Encounters:  06/22/18 31.79 kg/m  06/18/18 31.86 kg/m  06/01/18 31.79 kg/m  04/09/18 31.79 kg/m   Wt Readings from Last 4 Encounters:  06/22/18 203 lb (92.1 kg)  06/18/18 203 lb 7 oz (92.3 kg)  06/01/18 203 lb (92.1 kg)  04/09/18 203 lb (92.1 kg)  Psych/Mental status: Alert, oriented x 3 (person, place, & time)       Eyes: PERLA Respiratory: No evidence of acute respiratory distress  Cervical Spine Area Exam  Skin & Axial Inspection: No masses, redness, edema, swelling, or associated skin lesions Alignment: Symmetrical Functional ROM: Unrestricted ROM      Stability: No instability detected Muscle Tone/Strength: Functionally intact. No obvious neuro-muscular anomalies detected. Sensory (Neurological): Unimpaired Palpation: No palpable anomalies              Upper Extremity (UE) Exam    Side: Right upper extremity  Side: Left upper extremity  Skin & Extremity Inspection: Skin color, temperature, and hair growth are WNL. No peripheral edema or cyanosis. No masses, redness, swelling, asymmetry, or associated skin lesions. No contractures.  Skin & Extremity Inspection: Skin color, temperature, and hair growth are WNL. No peripheral edema or cyanosis. No masses, redness, swelling, asymmetry, or associated skin lesions. No contractures.  Functional ROM: Unrestricted ROM          Functional ROM: Decreased ROM for shoulder and elbow  Muscle Tone/Strength: Functionally intact. No obvious neuro-muscular anomalies detected.  Muscle Tone/Strength: Functionally intact. No obvious neuro-muscular anomalies detected.  Sensory (Neurological): Unimpaired          Sensory (Neurological):  Musculoskeletal pain pattern          Palpation: No palpable anomalies              Palpation: No palpable anomalies              Provocative Test(s):  Phalen's test: deferred Tinel's test: deferred Apley's  scratch test (touch opposite shoulder):  Action 1 (Across chest): deferred Action 2 (Overhead): deferred Action 3 (LB reach): deferred   Provocative Test(s):  Phalen's test: deferred Tinel's test: deferred Apley's scratch test (touch opposite shoulder):  Action 1 (Across chest): deferred Action 2 (Overhead): deferred Action 3 (LB reach): deferred    Thoracic Spine Area Exam  Skin & Axial Inspection: No masses, redness, or swelling Alignment: Symmetrical Functional ROM: Unrestricted ROM Stability: No instability detected Muscle Tone/Strength: Functionally intact. No obvious neuro-muscular anomalies detected. Sensory (Neurological): Unimpaired Muscle strength & Tone: No palpable anomalies  Lumbar Spine Area Exam  Skin & Axial Inspection: No masses, redness, or swelling Alignment: Symmetrical Functional ROM: Decreased ROM       Stability: No instability detected Muscle Tone/Strength: Functionally intact. No obvious neuro-muscular anomalies detected. Sensory (Neurological): Dermatomal pain pattern and musculoskeletal on the left side Palpation: No palpable anomalies       Provocative Tests: Hyperextension/rotation test: deferred today       Lumbar quadrant test (Kemp's test): deferred today       Lateral bending test: deferred today       Patrick's Maneuver: deferred today                   FABER* test: deferred today                   S-I anterior distraction/compression test: deferred today         S-I lateral compression test: deferred today         S-I Thigh-thrust test: deferred today         S-I Gaenslen's test: deferred today         *(Flexion, ABduction and External Rotation)  Gait & Posture Assessment  Ambulation: Limited Gait: Antalgic Posture: Antalgic   Lower Extremity Exam    Side: Right lower extremity  Side: Left lower extremity  Stability: No instability observed          Stability: No instability observed          Skin & Extremity Inspection: Skin color,  temperature, and hair growth are WNL. No peripheral edema or cyanosis. No masses, redness, swelling, asymmetry, or associated skin lesions. No contractures.  Skin & Extremity Inspection: Skin color, temperature, and hair growth are WNL. No peripheral edema or cyanosis. No masses, redness, swelling, asymmetry, or associated skin lesions. No contractures.  Functional ROM: Unrestricted ROM                  Functional ROM: Decreased ROM for hip and knee joints          Muscle Tone/Strength: Functionally intact. No obvious neuro-muscular anomalies detected.  Muscle Tone/Strength: Functionally intact. No obvious neuro-muscular anomalies detected.  Sensory (Neurological): Unimpaired        Sensory (Neurological): Arthropathic arthralgia, musculoskeletal, dermatomal left L4-L5        DTR: Patellar: deferred today Achilles: deferred today Plantar: deferred today  DTR: Patellar: deferred today Achilles: deferred today Plantar: deferred today  Palpation: No palpable anomalies  Palpation: No palpable anomalies   Assessment  Primary Diagnosis & Pertinent Problem List: The primary encounter diagnosis was  Myofascial pain syndrome. Diagnoses of Lumbar degenerative disc disease and Chronic left-sided low back pain with left-sided sciatica were also pertinent to this visit.  Status Diagnosis  Having a Flare-up Having a Flare-up Having a Flare-up 1. Myofascial pain syndrome   2. Lumbar degenerative disc disease   3. Chronic left-sided low back pain with left-sided sciatica     Problems updated and reviewed during this visit: Problem  Chronic Left-Sided Low Back Pain With Left-Sided Sciatica  Myofascial Pain Syndrome  Lumbar Degenerative Disc Disease   Plan of Care  Pharmacotherapy (Medications Ordered): Meds ordered this encounter  Medications  . ketorolac (TORADOL) 30 MG/ML injection 30 mg  . orphenadrine (NORFLEX) injection 30 mg    Provider-requested follow-up: Return if symptoms worsen or  fail to improve.  Future Appointments  Date Time Provider [place]  07/28/2018  2:30 PM [name], MD [facility] None    Primary Care Physician: Glendon Axe, MD Location: [facility] Outpatient Pain Management Facility Note by: [name], M.D Date: 06/22/2018; Time: 1:08 PM  There are no Patient Instructions on file for this visit.

## 2018-06-23 ENCOUNTER — Ambulatory Visit: Admission: RE | Admit: 2018-06-23 | Payer: 59 | Source: Ambulatory Visit | Admitting: Surgery

## 2018-06-23 NOTE — Telephone Encounter (Signed)
Pt was called , no problems was reported. 

## 2018-06-30 ENCOUNTER — Other Ambulatory Visit: Payer: Self-pay | Admitting: Internal Medicine

## 2018-06-30 DIAGNOSIS — Z1231 Encounter for screening mammogram for malignant neoplasm of breast: Secondary | ICD-10-CM

## 2018-07-14 ENCOUNTER — Telehealth: Payer: Self-pay | Admitting: Student in an Organized Health Care Education/Training Program

## 2018-07-14 MED ORDER — GABAPENTIN 300 MG PO CAPS: ORAL_CAPSULE | ORAL | 2 refills | Status: AC

## 2018-07-14 NOTE — Telephone Encounter (Signed)
Increase Gabapentin to 300 mg qday, 300 mg qpm, 600 mg qhs

## 2018-07-14 NOTE — Telephone Encounter (Signed)
Patient Erika Crawford 07-13-18 stating she is in a lot pain. The procedure only help a cpl of days. She is hurting all the way down into her thighs. What would you suggest to help with pain

## 2018-07-14 NOTE — Telephone Encounter (Signed)
Increase what med to 300mg ?  I don't see that we have prescribed anything for her. Please clarify.

## 2018-07-14 NOTE — Telephone Encounter (Signed)
Spoke with patient, she already takes 300 mg tid. Dr. Holley Raring will prescribe 300 mg tabs, with instructions to add 300 mg at bedtime, the dose being 300 mg in a.m., 300 mg mid day, 600 mg at bedtime. Patient notified, she stated back to me the dose for clarification, indicating understanding.

## 2018-07-14 NOTE — Telephone Encounter (Signed)
Can consider repeating L-ESI after shoulder surgery. In the meantime recommend increasing to 300 mg BID.

## 2018-07-22 ENCOUNTER — Other Ambulatory Visit: Payer: Self-pay | Admitting: Surgery

## 2018-07-22 DIAGNOSIS — M19011 Primary osteoarthritis, right shoulder: Secondary | ICD-10-CM

## 2018-07-22 DIAGNOSIS — M7581 Other shoulder lesions, right shoulder: Secondary | ICD-10-CM

## 2018-07-22 DIAGNOSIS — M75111 Incomplete rotator cuff tear or rupture of right shoulder, not specified as traumatic: Secondary | ICD-10-CM

## 2018-07-28 ENCOUNTER — Other Ambulatory Visit: Payer: Self-pay

## 2018-07-28 ENCOUNTER — Encounter: Payer: Self-pay | Admitting: Student in an Organized Health Care Education/Training Program

## 2018-07-28 ENCOUNTER — Ambulatory Visit
Payer: 59 | Attending: Student in an Organized Health Care Education/Training Program | Admitting: Student in an Organized Health Care Education/Training Program

## 2018-07-28 ENCOUNTER — Ambulatory Visit: Payer: 59 | Admitting: Student in an Organized Health Care Education/Training Program

## 2018-07-28 VITALS — BP 139/89 | HR 84 | Temp 98.5°F | Resp 17 | Ht 67.0 in | Wt 203.0 lb

## 2018-07-28 DIAGNOSIS — G894 Chronic pain syndrome: Secondary | ICD-10-CM | POA: Insufficient documentation

## 2018-07-28 DIAGNOSIS — S46101S Unspecified injury of muscle, fascia and tendon of long head of biceps, right arm, sequela: Secondary | ICD-10-CM | POA: Diagnosis not present

## 2018-07-28 DIAGNOSIS — M7581 Other shoulder lesions, right shoulder: Secondary | ICD-10-CM | POA: Diagnosis present

## 2018-07-28 DIAGNOSIS — M19011 Primary osteoarthritis, right shoulder: Secondary | ICD-10-CM | POA: Insufficient documentation

## 2018-07-28 DIAGNOSIS — M5416 Radiculopathy, lumbar region: Secondary | ICD-10-CM | POA: Diagnosis present

## 2018-07-28 MED ORDER — HYDROCODONE-ACETAMINOPHEN 5-325 MG PO TABS: 1.0000 | ORAL_TABLET | Freq: Two times a day (BID) | ORAL | 0 refills | Status: DC | PRN

## 2018-07-28 NOTE — Patient Instructions (Addendum)
UDS today Sign opioid contract Lumbar epidural steroid injection without sedation next week  Hydrocodone with acetaminophen to last until 08/27/2018 has been escribed to your pharmacy.  GENERAL RISKS AND COMPLICATIONS  What are the risk, side effects and possible complications? Generally speaking, most procedures are safe.  However, with any procedure there are risks, side effects, and the possibility of complications.  The risks and complications are dependent upon the sites that are lesioned, or the type of nerve block to be performed.  The closer the procedure is to the spine, the more serious the risks are.  Great care is taken when placing the radio frequency needles, block needles or lesioning probes, but sometimes complications can occur. 1. Infection: Any time there is an injection through the skin, there is a risk of infection.  This is why sterile conditions are used for these blocks.  There are four possible types of infection. 1. Localized skin infection. 2. Central Nervous System Infection-This can be in the form of Meningitis, which can be deadly. 3. Epidural Infections-This can be in the form of an epidural abscess, which can cause pressure inside of the spine, causing compression of the spinal cord with subsequent paralysis. This would require an emergency surgery to decompress, and there are no guarantees that the patient would recover from the paralysis. 4. Discitis-This is an infection of the intervertebral discs.  It occurs in about 1% of discography procedures.  It is difficult to treat and it may lead to surgery.        2. Pain: the needles have to go through skin and soft tissues, will cause soreness.       3. Damage to internal structures:  The nerves to be lesioned may be near blood vessels or    other nerves which can be potentially damaged.       4. Bleeding: Bleeding is more common if the patient is taking blood thinners such as  aspirin, Coumadin, Ticiid, Plavix, etc.,  or if he/she have some genetic predisposition  such as hemophilia. Bleeding into the spinal canal can cause compression of the spinal  cord with subsequent paralysis.  This would require an emergency surgery to  decompress and there are no guarantees that the patient would recover from the  paralysis.       5. Pneumothorax:  Puncturing of a lung is a possibility, every time a needle is introduced in  the area of the chest or upper back.  Pneumothorax refers to free air around the  collapsed lung(s), inside of the thoracic cavity (chest cavity).  Another two possible  complications related to a similar event would include: Hemothorax and Chylothorax.   These are variations of the Pneumothorax, where instead of air around the collapsed  lung(s), you may have blood or chyle, respectively.       6. Spinal headaches: They may occur with any procedures in the area of the spine.       7. Persistent CSF (Cerebro-Spinal Fluid) leakage: This is a rare problem, but may occur  with prolonged intrathecal or epidural catheters either due to the formation of a fistulous  track or a dural tear.       8. Nerve damage: By working so close to the spinal cord, there is always a possibility of  nerve damage, which could be as serious as a permanent spinal cord injury with  paralysis.       9. Death:  Although rare, severe deadly allergic reactions known as "  Anaphylactic  reaction" can occur to any of the medications used.      10. Worsening of the symptoms:  We can always make thing worse.  What are the chances of something like this happening? Chances of any of this occuring are extremely low.  By statistics, you have more of a chance of getting killed in a motor vehicle accident: while driving to the hospital than any of the above occurring .  Nevertheless, you should be aware that they are possibilities.  In general, it is similar to taking a shower.  Everybody knows that you can slip, hit your head and get killed.  Does  that mean that you should not shower again?  Nevertheless always keep in mind that statistics do not mean anything if you happen to be on the wrong side of them.  Even if a procedure has a 1 (one) in a 1,000,000 (million) chance of going wrong, it you happen to be that one..Also, keep in mind that by statistics, you have more of a chance of having something go wrong when taking medications.  Who should not have this procedure? If you are on a blood thinning medication (e.g. Coumadin, Plavix, see list of "Blood Thinners"), or if you have an active infection going on, you should not have the procedure.  If you are taking any blood thinners, please inform your physician.  How should I prepare for this procedure?  Do not eat or drink anything at least six hours prior to the procedure.  Bring a driver with you .  It cannot be a taxi.  Come accompanied by an adult that can drive you back, and that is strong enough to help you if your legs get weak or numb from the local anesthetic.  Take all of your medicines the morning of the procedure with just enough water to swallow them.  If you have diabetes, make sure that you are scheduled to have your procedure done first thing in the morning, whenever possible.  If you have diabetes, take only half of your insulin dose and notify our nurse that you have done so as soon as you arrive at the clinic.  If you are diabetic, but only take blood sugar pills (oral hypoglycemic), then do not take them on the morning of your procedure.  You may take them after you have had the procedure.  Do not take aspirin or any aspirin-containing medications, at least eleven (11) days prior to the procedure.  They may prolong bleeding.  Wear loose fitting clothing that may be easy to take off and that you would not mind if it got stained with Betadine or blood.  Do not wear any jewelry or perfume  Remove any nail coloring.  It will interfere with some of our monitoring  equipment.  NOTE: Remember that this is not meant to be interpreted as a complete list of all possible complications.  Unforeseen problems may occur.  BLOOD THINNERS The following drugs contain aspirin or other products, which can cause increased bleeding during surgery and should not be taken for 2 weeks prior to and 1 week after surgery.  If you should need take something for relief of minor pain, you may take acetaminophen which is found in Tylenol,m Datril, Anacin-3 and Panadol. It is not blood thinner. The products listed below are.  Do not take any of the products listed below in addition to any listed on your instruction sheet.  A.P.C or A.P.C with Codeine Codeine Phosphate  Capsules #3 Ibuprofen Ridaura  ABC compound Congesprin Imuran rimadil  Advil Cope Indocin Robaxisal  Alka-Seltzer Effervescent Pain Reliever and Antacid Coricidin or Coricidin-D  Indomethacin Rufen  Alka-Seltzer plus Cold Medicine Cosprin Ketoprofen S-A-C Tablets  Anacin Analgesic Tablets or Capsules Coumadin Korlgesic Salflex  Anacin Extra Strength Analgesic tablets or capsules CP-2 Tablets Lanoril Salicylate  Anaprox Cuprimine Capsules Levenox Salocol  Anexsia-D Dalteparin Magan Salsalate  Anodynos Darvon compound Magnesium Salicylate Sine-off  Ansaid Dasin Capsules Magsal Sodium Salicylate  Anturane Depen Capsules Marnal Soma  APF Arthritis pain formula Dewitt's Pills Measurin Stanback  Argesic Dia-Gesic Meclofenamic Sulfinpyrazone  Arthritis Bayer Timed Release Aspirin Diclofenac Meclomen Sulindac  Arthritis pain formula Anacin Dicumarol Medipren Supac  Analgesic (Safety coated) Arthralgen Diffunasal Mefanamic Suprofen  Arthritis Strength Bufferin Dihydrocodeine Mepro Compound Suprol  Arthropan liquid Dopirydamole Methcarbomol with Aspirin Synalgos  ASA tablets/Enseals Disalcid Micrainin Tagament  Ascriptin Doan's Midol Talwin  Ascriptin A/D Dolene Mobidin Tanderil  Ascriptin Extra Strength Dolobid  Moblgesic Ticlid  Ascriptin with Codeine Doloprin or Doloprin with Codeine Momentum Tolectin  Asperbuf Duoprin Mono-gesic Trendar  Aspergum Duradyne Motrin or Motrin IB Triminicin  Aspirin plain, buffered or enteric coated Durasal Myochrisine Trigesic  Aspirin Suppositories Easprin Nalfon Trillsate  Aspirin with Codeine Ecotrin Regular or Extra Strength Naprosyn Uracel  Atromid-S Efficin Naproxen Ursinus  Auranofin Capsules Elmiron Neocylate Vanquish  Axotal Emagrin Norgesic Verin  Azathioprine Empirin or Empirin with Codeine Normiflo Vitamin E  Azolid Emprazil Nuprin Voltaren  Bayer Aspirin plain, buffered or children's or timed BC Tablets or powders Encaprin Orgaran Warfarin Sodium  Buff-a-Comp Enoxaparin Orudis Zorpin  Buff-a-Comp with Codeine Equegesic Os-Cal-Gesic   Buffaprin Excedrin plain, buffered or Extra Strength Oxalid   Bufferin Arthritis Strength Feldene Oxphenbutazone   Bufferin plain or Extra Strength Feldene Capsules Oxycodone with Aspirin   Bufferin with Codeine Fenoprofen Fenoprofen Pabalate or Pabalate-SF   Buffets II Flogesic Panagesic   Buffinol plain or Extra Strength Florinal or Florinal with Codeine Panwarfarin   Buf-Tabs Flurbiprofen Penicillamine   Butalbital Compound Four-way cold tablets Penicillin   Butazolidin Fragmin Pepto-Bismol   Carbenicillin Geminisyn Percodan   Carna Arthritis Reliever Geopen Persantine   Carprofen Gold's salt Persistin   Chloramphenicol Goody's Phenylbutazone   Chloromycetin Haltrain Piroxlcam   Clmetidine heparin Plaquenil   Cllnoril Hyco-pap Ponstel   Clofibrate Hydroxy chloroquine Propoxyphen         Before stopping any of these medications, be sure to consult the physician who ordered them.  Some, such as Coumadin (Warfarin) are ordered to prevent or treat serious conditions such as "deep thrombosis", "pumonary embolisms", and other heart problems.  The amount of time that you may need off of the medication may also vary with  the medication and the reason for which you were taking it.  If you are taking any of these medications, please make sure you notify your pain physician before you undergo any procedures.   Epidural Steroid Injection Patient Information  Description: The epidural space surrounds the nerves as they exit the spinal cord.  In some patients, the nerves can be compressed and inflamed by a bulging disc or a tight spinal canal (spinal stenosis).  By injecting steroids into the epidural space, we can bring irritated nerves into direct contact with a potentially helpful medication.  These steroids act directly on the irritated nerves and can reduce swelling and inflammation which often leads to decreased pain.  Epidural steroids may be injected anywhere along the spine and from the neck to the low back  depending upon the location of your pain.   After numbing the skin with local anesthetic (like Novocaine), a small needle is passed into the epidural space slowly.  You may experience a sensation of pressure while this is being done.  The entire block usually last less than 10 minutes.  Conditions which may be treated by epidural steroids:   Low back and leg pain  Neck and arm pain  Spinal stenosis  Post-laminectomy syndrome  Herpes zoster (shingles) pain  Pain from compression fractures  Preparation for the injection:  1. Do not eat any solid food or dairy products within 8 hours of your appointment.  2. You may drink clear liquids up to 3 hours before appointment.  Clear liquids include water, black coffee, juice or soda.  No milk or cream please. 3. You may take your regular medication, including pain medications, with a sip of water before your appointment  Diabetics should hold regular insulin (if taken separately) and take 1/2 normal NPH dos the morning of the procedure.  Carry some sugar containing items with you to your appointment. 4. A driver must accompany you and be prepared to drive  you home after your procedure.  5. Bring all your current medications with your. 6. An IV may be inserted and sedation may be given at the discretion of the physician.   7. A blood pressure cuff, EKG and other monitors will often be applied during the procedure.  Some patients may need to have extra oxygen administered for a short period. 8. You will be asked to provide medical information, including your allergies, prior to the procedure.  We must know immediately if you are taking blood thinners (like Coumadin/Warfarin)  Or if you are allergic to IV iodine contrast (dye). We must know if you could possible be pregnant.  Possible side-effects:  Bleeding from needle site  Infection (rare, may require surgery)  Nerve injury (rare)  Numbness & tingling (temporary)  Difficulty urinating (rare, temporary)  Spinal headache ( a headache worse with upright posture)  Light -headedness (temporary)  Pain at injection site (several days)  Decreased blood pressure (temporary)  Weakness in arm/leg (temporary)  Pressure sensation in back/neck (temporary)  Call if you experience:  Fever/chills associated with headache or increased back/neck pain.  Headache worsened by an upright position.  New onset weakness or numbness of an extremity below the injection site  Hives or difficulty breathing (go to the emergency room)  Inflammation or drainage at the infection site  Severe back/neck pain  Any new symptoms which are concerning to you  Please note:  Although the local anesthetic injected can often make your back or neck feel good for several hours after the injection, the pain will likely return.  It takes 3-7 days for steroids to work in the epidural space.  You may not notice any pain relief for at least that one week.  If effective, we will often do a series of three injections spaced 3-6 weeks apart to maximally decrease your pain.  After the initial series, we generally will wait  several months before considering a repeat injection of the same type.  If you have any questions, please call [phone] [facility]

## 2018-07-28 NOTE — Progress Notes (Signed)
Patient's Name: Erika Crawford  MRN: [id number]  Referring Provider: Glendon Axe, MD  DOB: 20-Mar-1941  PCP: Glendon Axe, MD  DOS: 07/28/2018  Note by: [name], MD  Service setting: Ambulatory outpatient  Specialty: Interventional Pain Management  Location: [facility] (AMB) Pain Management Facility    Patient type: Established   Primary Reason(s) for Visit: Evaluation of chronic illnesses with exacerbation, or progression (Level of risk: moderate) CC: Back Pain (lower) and Shoulder Pain (bilateral)  HPI  Erika Crawford is a 78 y.o. year old, female patient, who comes today for a follow-up evaluation. 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 Erika Crawford was last seen on 07/14/2018. Her primarily concern today is the Back Pain (lower) and Shoulder Pain (bilateral)  Pain Assessment: Location:   Back Radiating: through both hips to thighs, left side is worse Onset: More than a month ago Duration: Chronic pain Quality: Aching Severity: 9 /10 (subjective, self-reported pain score)  Note: Reported level is inconsistent with clinical observations.                         When using our objective Pain Scale, levels between 6 and 10/10 are said  to belong in an emergency room, as it progressively worsens from a 6/10, described as severely limiting, requiring emergency care not usually available at an outpatient pain management facility. At a 6/10 level, communication becomes difficult and requires great effort. Assistance to reach the emergency department may be required. Facial flushing and profuse sweating along with potentially dangerous increases in heart rate and blood pressure will be evident. Effect on ADL: limits daily activities Timing: Constant Modifying factors: topicals, heat and ice "calms pain down a little bit" BP: 139/89  HR: 84  Further details on both, my assessment(s), as well as the proposed treatment plan, please see below.  Having increased pain in bilateral shoulders, R>L.  Also having increased LE pain secondary to lumbar radiculopathy. States that previous L-ESI injection in December didn't last as long but attributes that to increased activity the day after the procedure.    Laboratory Chemistry  Inflammation Markers  Renal Function Markers Lab Results  Component Value Date   BUN 13 04/15/2016   CREATININE 0.64 04/15/2016   GFRAA >60 04/15/2016   GFRNONAA >60 04/15/2016                             Hepatic Function Markers Lab Results  Component Value Date   AST 16 01/19/2014   ALT 25 01/19/2014   ALBUMIN 3.6 01/19/2014   ALKPHOS 62 01/19/2014                        Electrolytes Lab Results  Component Value Date   NA 136 04/15/2016   K 3.3 (L) 04/15/2016   CL  101 04/15/2016   CALCIUM 9.5 04/15/2016                        Coagulation Parameters Lab Results  Component Value Date   INR 1.06 04/15/2016   LABPROT 13.8 04/15/2016   APTT 30 04/15/2016   PLT 264 04/15/2016                        Cardiovascular Markers Lab Results  Component Value Date   TROPONINI <0.03 11/01/2014   HGB 12.3 04/15/2016   HCT 37.6 04/15/2016                         CA Markers No results found for:  CEA, CA125, LABCA2                      Endocrine Markers No results found for: TSH, FREET4, TESTOFREE, TESTOSTERONE, ESTRADIOL, ESTRADIOLPCT, ESTRADIOLFRE                      Note: Lab results reviewed.  Recent Diagnostic Imaging Review    Shoulder-R MR wo contrast:  Results for orders placed during the hospital encounter of 01/01/17  MR SHOULDER RIGHT WO CONTRAST   Narrative CLINICAL DATA:  Right shoulder pain and decreased range of motion, chronic. No known injury.  EXAM: MRI OF THE RIGHT SHOULDER WITHOUT CONTRAST  TECHNIQUE: Multiplanar, multisequence MR imaging of the shoulder was performed. No intravenous contrast was administered.  COMPARISON:  MRI right shoulder 11/01/2015.  FINDINGS: Rotator cuff: Rotator cuff tendinopathy appearing worst in the supraspinatus is again seen. Interstitial tear of the posterior far lateral supraspinatus measures 0.5 cm from front to back. No full-thickness tear or retracted tendon. The tear is slightly larger than on the prior exam where it measured 0.4 cm.  Muscles:  No focal atrophy or lesion.  Biceps long head: Intact. Mild tendinosis of the intra-articular segment is noted.  Acromioclavicular Joint: Moderate degenerative disease is unchanged in appearance. Type 2 Acromion. No subacromial/subdeltoid bursal fluid.  Glenohumeral Joint: Advanced degenerative disease is again seen with denuding of hyaline cartilage. Mild subchondral edema about the joint is slightly worse on the prior examination and a large osteophyte off the humeral head has slightly increased in size.  Labrum:  Degenerated without discrete tear.  Bones:  No fracture or worrisome lesion.  Other: None.  IMPRESSION: Dominant finding is advanced glenohumeral osteoarthritis which shows some worsening since the prior MRI.  Rotator cuff tendinopathy with a 0.5 cm from front to back interstitial tear of the supraspinatus at the greater tuberosity. The tear  is slightly larger than on the prior exam.  Moderate acromioclavicular osteoarthritis.   Electronically Signed   By: Inge Rise M.D.   On: 01/01/2017 16:14    Shoulder-L MR wo contrast:  Results for orders placed during the hospital encounter of 03/16/18  MR SHOULDER LEFT WO CONTRAST   Narrative CLINICAL DATA:  Left shoulder and arm pain for 5-6 weeks. No known injury.  EXAM: MRI OF THE LEFT SHOULDER WITHOUT CONTRAST  TECHNIQUE: Multiplanar, multisequence MR imaging of the shoulder was performed. No intravenous contrast was administered.  COMPARISON:  None.  FINDINGS: Rotator cuff: Heterogeneously increased T2 signal in the supraspinatus and infraspinatus tendons is consistent with tendinopathy. An undersurface tear of the far lateral supraspinatus measures 0.2 cm from front to back. There is no full-thickness rotator cuff  tear or tendon retraction.  Muscles:  Normal without atrophy or focal lesion.  Biceps long head:  Intact and normal in appearance.  Acromioclavicular Joint: Bulky degenerative change is present. Type 2 acromion. Small volume of fluid is seen in the subacromial/subdeltoid bursa.  Glenohumeral Joint: Appears normal.  Labrum:  Intact.  Bones:  No fracture or worrisome lesion.  Other: None.  IMPRESSION: Supraspinatus and infraspinatus tendinopathy with a 0.2 cm undersurface tear of the far lateral supraspinatus.  Bulky acromioclavicular osteoarthritis.  Small volume of subacromial/subdeltoid fluid compatible with bursitis.   Electronically Signed   By: Inge Rise M.D.   On: 03/16/2018 15:17    Lumbosacral Imaging: Lumbar MR wo contrast:  Results for orders placed during the hospital encounter of 03/18/17  MR LUMBAR SPINE WO CONTRAST   Narrative CLINICAL DATA:  78 year old female with 5 weeks of lumbar back pain radiating to the left leg.  EXAM: MRI LUMBAR SPINE WITHOUT CONTRAST  TECHNIQUE: Multiplanar, multisequence  MR imaging of the lumbar spine was performed. No intravenous contrast was administered.  COMPARISON:  CT Abdomen and Pelvis 07/29/2016. Lumbar radiographs 01/29/2014.  FINDINGS: Segmentation:  Normal as demonstrated on the comparisons.  Alignment: Stable since February. Chronic straightening of lumbar lordosis. Chronic L2 compression fracture. Grade 1 anterolisthesis of L4 on L5, which has increased since 2015.  Vertebrae: Chronic previously augmented severe L2 compression fracture with retropulsion of bone, stable since 07/29/2016. Background bone marrow signal is normal. No marrow edema or evidence of acute osseous abnormality. Intact visible sacrum and SI joints.  Conus medullaris: Extends to the L1-L2 level. No signal abnormality in the lower thoracic spinal cord or conus.  Paraspinal and other soft tissues: Stable visualized abdominal viscera. Negative visualized posterior paraspinal soft tissues.  Disc levels:  T11-T12:  Mild mostly anterior disc bulging.  T12-L1:  Negative.  L1-L2: Broad-based posterior disc osteophyte complex in part related to retropulsed L2 compression fracture. Mild to moderate facet and ligament flavum hypertrophy. Mild to moderate spinal stenosis - maximal at the mid L2 vertebral level - and right greater than left L1 neural foraminal stenosis.  L2-L3: Circumferential disc osteophyte complex with broad-based posterior component. Mild to moderate facet and ligament flavum hypertrophy. No significant spinal or lateral recess stenosis. Moderate bilateral L2 foraminal stenosis, greater on the right.  L3-L4: Vacuum disc. Circumferential but mostly far lateral disc bulge and endplate spurring. Broad-based posterior component. Mild to moderate facet and ligament flavum hypertrophy. Trace facet joint fluid. No spinal or convincing lateral recess stenosis. Mild bilateral L3 foraminal stenosis, greater on the left.  L4-L5: Vacuum disc. Mild grade 1  anterolisthesis. Left eccentric circumferential disc bulge with broad-based posterior component. Moderate to severe facet and ligament flavum hypertrophy. Capacious facet joints containing fluid. Mild epidural lipomatosis. Small subligamentous synovial cyst on the left (series 6, image 22). Moderate spinal stenosis (series 6, image 21). Moderate to severe left lateral recess stenosis (descending left L5 nerve root level). Mild right lateral recess stenosis. Moderate left and mild right L4 foraminal stenosis.  L5-S1: Vacuum disc. Disc space loss with bulky but mostly far lateral circumferential disc osteophyte complex. Moderate facet and moderate to severe ligament flavum hypertrophy. Moderate to severe bilateral L5 foraminal stenosis. Mild to moderate bilateral lateral recess stenosis (S1 nerve root levels). Mild spinal stenosis.  IMPRESSION: 1. Chronic, previously augmented severe L2 compression fracture with retropulsion of bone contributing to moderate spinal and bilateral foraminal stenosis at the L2 level. 2. L4-L5 grade 1 anterolisthesis with disc and severe  posterior element degeneration. Up to moderate spinal, moderate left foraminal, and severe left lateral recess stenosis. Query left L5 radiculitis. 3. Chronic disc and endplate degeneration at L5-S1 with moderate to severe bilateral foraminal stenosis and mild to moderate lateral recess stenosis.   Electronically Signed   By: Genevie Ann M.D.   On: 03/18/2017 14:09      Complexity Note: Imaging results reviewed. Results shared with Ms. Rossy, using Layman's terms.                         Meds   Current Outpatient Medications:  .  Acetaminophen (TYLENOL) 325 MG CAPS, Take 650 mg by mouth 2 (two) times daily., Disp: , Rfl:  .  alendronate (FOSAMAX) 70 MG tablet, Take 70 mg by mouth every Wednesday. Take with a full glass of water on an empty stomach. , Disp: , Rfl:  .  amLODipine (NORVASC) 5 MG tablet, Take 5 mg by  mouth daily., Disp: , Rfl:  .  ASPERCREME LIDOCAINE EX, Apply 1 application topically daily as needed (pain)., Disp: , Rfl:  .  cholecalciferol (VITAMIN D3) 25 MCG (1000 UT) tablet, Take 2,000 Units by mouth daily., Disp: , Rfl:  .  gabapentin (NEURONTIN) 300 MG capsule, 300 mg qday, 300 mg qPM, 600 mg qhs (Patient taking differently: 300 mg 3 (three) times daily. ), Disp: 60 capsule, Rfl: 2 .  Homeopathic Products [facility] RELIEF EX), Apply 1 application topically daily as needed (pain)., Disp: , Rfl:  .  levothyroxine (SYNTHROID, LEVOTHROID) 88 MCG tablet, Take 88 mcg by mouth daily before breakfast. , Disp: , Rfl:  .  mometasone (NASONEX) 50 MCG/ACT nasal spray, Place 2 sprays into the nose daily as needed (allergies). , Disp: , Rfl:  .  naproxen sodium (ALEVE) 220 MG tablet, Take 220 mg by mouth 2 (two) times daily., Disp: , Rfl:  .  olmesartan (BENICAR) 40 MG tablet, Take 40 mg by mouth daily., Disp: , Rfl:  .  omeprazole (PRILOSEC) 20 MG capsule, Take 20 mg by mouth daily., Disp: , Rfl:  .  HYDROcodone-acetaminophen (NORCO/VICODIN) 5-325 MG tablet, Take 1 tablet by mouth 2 (two) times daily as needed for up to 30 days for moderate pain., Disp: 60 tablet, Rfl: 0  ROS  Constitutional: Denies any fever or chills Gastrointestinal: No reported hemesis, hematochezia, vomiting, or acute GI distress Musculoskeletal: Denies any acute onset joint swelling, redness, loss of ROM, or weakness Neurological: No reported episodes of acute onset apraxia, aphasia, dysarthria, agnosia, amnesia, paralysis, loss of coordination, or loss of consciousness  Allergies  Ms. Howes is allergic to celebrex [celecoxib]; codeine; etodolac; hydrochlorothiazide; oxycodone; pravastatin; relafen [nabumetone]; and tramadol.  PFSH  Drug: Ms. Horseman  reports no history of drug use. Alcohol:  reports current alcohol use. Tobacco:  reports that she has quit smoking. She has never used smokeless tobacco. Medical:  has a  past medical history of Arthritis, Complication of anesthesia, Hypertension, Hypothyroidism, Sleep apnea, and Thyroid disease. Surgical: Ms. Hampshire  has a past surgical history that includes Abdominal hysterectomy; Colonoscopy with propofol (N/A, 09/21/2015); Back surgery; Total knee arthroplasty (Left, 03/21/2016); and Joint replacement. Family: family history is not on file.  Constitutional Exam  General appearance: Well nourished, well developed, and well hydrated. In no apparent acute distress Vitals:   07/28/18 1437  BP: 139/89  Pulse: 84  Resp: 17  Temp: 98.5 F (36.9 C)  TempSrc: Oral  SpO2: 100%  Weight: 203 lb (92.1  kg)  Height: 5\' 7"  (1.702 m)   BMI Assessment: Estimated body mass index is 31.79 kg/m as calculated from the following:   Height as of this encounter: 5\' 7"  (1.702 m).   Weight as of this encounter: 203 lb (92.1 kg).  BMI interpretation table: BMI level Category Range association with higher incidence of chronic pain  <18 kg/m2 Underweight   18.5-24.9 kg/m2 Ideal body weight   25-29.9 kg/m2 Overweight Increased incidence by 20%  30-34.9 kg/m2 Obese (Class I) Increased incidence by 68%  35-39.9 kg/m2 Severe obesity (Class II) Increased incidence by 136%  >40 kg/m2 Extreme obesity (Class III) Increased incidence by 254%   Patient's current BMI Ideal Body weight  Body mass index is 31.79 kg/m. Ideal body weight: 61.6 kg (135 lb 12.9 oz) Adjusted ideal body weight: 73.8 kg (162 lb 10.9 oz)   BMI Readings from Last 4 Encounters:  07/28/18 31.79 kg/m  06/22/18 31.79 kg/m  06/18/18 31.86 kg/m  06/01/18 31.79 kg/m   Wt Readings from Last 4 Encounters:  07/28/18 203 lb (92.1 kg)  06/22/18 203 lb (92.1 kg)  06/18/18 203 lb 7 oz (92.3 kg)  06/01/18 203 lb (92.1 kg)  Psych/Mental status: Alert, oriented x 3 (person, place, & time)       Eyes: PERLA Respiratory: No evidence of acute respiratory distress  Cervical Spine Area Exam  Skin & Axial Inspection:  No masses, redness, edema, swelling, or associated skin lesions Alignment: Symmetrical Functional ROM: Decreased ROM      Stability: No instability detected Muscle Tone/Strength: Functionally intact. No obvious neuro-muscular anomalies detected. Sensory (Neurological): Musculoskeletal pain pattern Palpation: No palpable anomalies              Upper Extremity (UE) Exam    Side: Right upper extremity  Side: Left upper extremity  Skin & Extremity Inspection: Skin color, temperature, and hair growth are WNL. No peripheral edema or cyanosis. No masses, redness, swelling, asymmetry, or associated skin lesions. No contractures.  Skin & Extremity Inspection: Skin color, temperature, and hair growth are WNL. No peripheral edema or cyanosis. No masses, redness, swelling, asymmetry, or associated skin lesions. No contractures.  Functional ROM: Decreased ROM for shoulder and elbow  Functional ROM: Decreased ROM for shoulder and elbow  Muscle Tone/Strength: Functionally intact. No obvious neuro-muscular anomalies detected.  Muscle Tone/Strength: Functionally intact. No obvious neuro-muscular anomalies detected.  Sensory (Neurological): Arthropathic arthralgia          Sensory (Neurological): Arthropathic arthralgia          Palpation: No palpable anomalies              Palpation: No palpable anomalies              Provocative Test(s):  Phalen's test: deferred Tinel's test: deferred Apley's scratch test (touch opposite shoulder):  Action 1 (Across chest): Decreased ROM Action 2 (Overhead): Decreased ROM Action 3 (LB reach): Decreased ROM   Provocative Test(s):  Phalen's test: deferred Tinel's test: deferred Apley's scratch test (touch opposite shoulder):  Action 1 (Across chest): Decreased ROM Action 2 (Overhead): Decreased ROM Action 3 (LB reach): Decreased ROM    Thoracic Spine Area Exam  Skin & Axial Inspection: No masses, redness, or swelling Alignment: Symmetrical Functional ROM:  Unrestricted ROM Stability: No instability detected Muscle Tone/Strength: Functionally intact. No obvious neuro-muscular anomalies detected. Sensory (Neurological): Unimpaired Muscle strength & Tone: No palpable anomalies  Lumbar Spine Area Exam  Skin & Axial Inspection: No masses, redness, or swelling Alignment:  Symmetrical Functional ROM: Decreased ROM       Stability: No instability detected Muscle Tone/Strength: Functionally intact. No obvious neuro-muscular anomalies detected. Sensory (Neurological): Dermatomal pain pattern Palpation: No palpable anomalies       Provocative Tests: Hyperextension/rotation test: (+) due to pain. Lumbar quadrant test (Kemp's test): (+) bilateral for foraminal stenosis Lateral bending test: deferred today       Patrick's Maneuver: deferred today                   FABER* test: deferred today                   S-I anterior distraction/compression test: deferred today         S-I lateral compression test: deferred today         S-I Thigh-thrust test: deferred today         S-I Gaenslen's test: deferred today         *(Flexion, ABduction and External Rotation)  Gait & Posture Assessment  Ambulation: Limited Gait: Antalgic Posture: Difficulty standing up straight, due to pain   Lower Extremity Exam    Side: Right lower extremity  Side: Left lower extremity  Stability: No instability observed          Stability: No instability observed          Skin & Extremity Inspection: Skin color, temperature, and hair growth are WNL. No peripheral edema or cyanosis. No masses, redness, swelling, asymmetry, or associated skin lesions. No contractures.  Skin & Extremity Inspection: Skin color, temperature, and hair growth are WNL. No peripheral edema or cyanosis. No masses, redness, swelling, asymmetry, or associated skin lesions. No contractures.  Functional ROM: Decreased ROM for hip and knee joints          Functional ROM: Decreased ROM for hip and knee joints           Muscle Tone/Strength: Functionally intact. No obvious neuro-muscular anomalies detected.  Muscle Tone/Strength: Functionally intact. No obvious neuro-muscular anomalies detected.  Sensory (Neurological): Dermatomal pain pattern        Sensory (Neurological): Dermatomal pain pattern        DTR: Patellar: 1+: trace Achilles: deferred today Plantar: deferred today  DTR: Patellar: 1+: trace Achilles: deferred today Plantar: deferred today  Palpation: No palpable anomalies  Palpation: No palpable anomalies   Assessment   Status Diagnosis  Having a Flare-up Having a Flare-up Persistent 1. Lumbar radiculopathy   2. Injury of tendon of long head of right biceps, sequela   3. Primary osteoarthritis of right shoulder   4. Rotator cuff tendinitis, right   5. Chronic pain syndrome      Lumbar radiculopathy: repeat L5-S1 (previous one L5-S1), use 5 inch needle  Patient is scheduled to have right rotator cuff surgery next month.  She is endorsing significant pain.  Is having trouble performing ADLs.  Patient has tried shoulder steroid injection as well as various non-opioid analgesics including ibuprofen, naproxen, Mobic, gabapentin, Tylenol without any significant benefit.  Recommend low-dose hydrocodone which the patient can take as needed for breakthrough pain.  Risks and benefits of chronic opioid therapy discussed with patient.  Patient informed of optimizing bowel regimen.   Plan: -Repeat L5-S1 ESI (#1 for 2020); use 5 inch needle -UDS today -Prescription for hydrocodone as below -Continue gabapentin as prescribed.  Future considerations: -Suprascapular nerve block    Plan of Care  Pharmacotherapy (Medications Ordered): Meds ordered this encounter  Medications  . HYDROcodone-acetaminophen (NORCO/VICODIN)  5-325 MG tablet    Sig: Take 1 tablet by mouth 2 (two) times daily as needed for up to 30 days for moderate pain.    Dispense:  60 tablet    Refill:  0    Do not place  this medication, or any other prescription from our practice, on "Automatic Refill". Patient may have prescription filled one day early if pharmacy is closed on scheduled refill date. :   Lab-work, procedure(s), and/or referral(s): Orders Placed This Encounter  Procedures  . Lumbar Epidural Injection  . Compliance Drug Analysis, Ur   Provider-requested follow-up: Return in about 8 days (around 08/05/2018) for Procedure.  Time Note: Greater than 50% of the 25 minute(s) of face-to-face time spent with Ms. Schroeter, was spent in counseling/coordination of care regarding: Ms. Penado primary cause of pain, the treatment plan, treatment alternatives, the risks and possible complications of proposed treatment, medication side effects, going over the informed consent, the opioid analgesic risks and possible complications, the results, interpretation and significance of  her recent diagnostic interventional treatment(s), the appropriate use of her medications, realistic expectations, the goals of pain management (increased in functionality), the medication agreement and the patient's responsibilities when it comes to controlled substances.  Future Appointments  Date Time Provider [place]  08/06/2018 11:00 AM [facility]  08/10/2018 12:00 PM [facility]    Primary Care Physician: Glendon Axe, MD Location: [facility] Out[facility] Facility Note by: [name], M.D Date: 07/28/2018; Time: 3:20 PM  Patient Instructions  UDS today Sign opioid contract Lumbar epidural steroid injection without sedation next week  Hydrocodone with acetaminophen to last until 08/27/2018 has been escribed to your pharmacy.

## 2018-07-28 NOTE — Progress Notes (Signed)
Safety precautions to be maintained throughout the outpatient stay will include: orient to surroundings, keep bed in low position, maintain call bell within reach at all times, provide assistance with transfer out of bed and ambulation.  

## 2018-08-06 ENCOUNTER — Ambulatory Visit
Admission: RE | Admit: 2018-08-06 | Discharge: 2018-08-06 | Disposition: A | Discharge status: Home / Self Care | Payer: 59 | Source: Ambulatory Visit | Attending: Surgery | Admitting: Surgery

## 2018-08-06 DIAGNOSIS — M7581 Other shoulder lesions, right shoulder: Secondary | ICD-10-CM | POA: Insufficient documentation

## 2018-08-06 DIAGNOSIS — M19011 Primary osteoarthritis, right shoulder: Secondary | ICD-10-CM | POA: Diagnosis present

## 2018-08-06 DIAGNOSIS — M75111 Incomplete rotator cuff tear or rupture of right shoulder, not specified as traumatic: Secondary | ICD-10-CM | POA: Insufficient documentation

## 2018-08-06 LAB — COMPLIANCE DRUG ANALYSIS, UR

## 2018-08-08 IMAGING — CR DG CHEST 2V
1 series · 2 of 2 positions shown · non-contrast
Comparison: [DATE]

CLINICAL DATA: Pre-op evaluation for knee replacement scheduled for
[REDACTED], 3070. Patient states she has a HX of HTN, complications
with anesthesia, and sleep apnea,

EXAM:
CHEST  2 VIEW

[Series 1: w chest pa · 0.14mm/px · 2 of 2 slices shown]
[im 1/2]
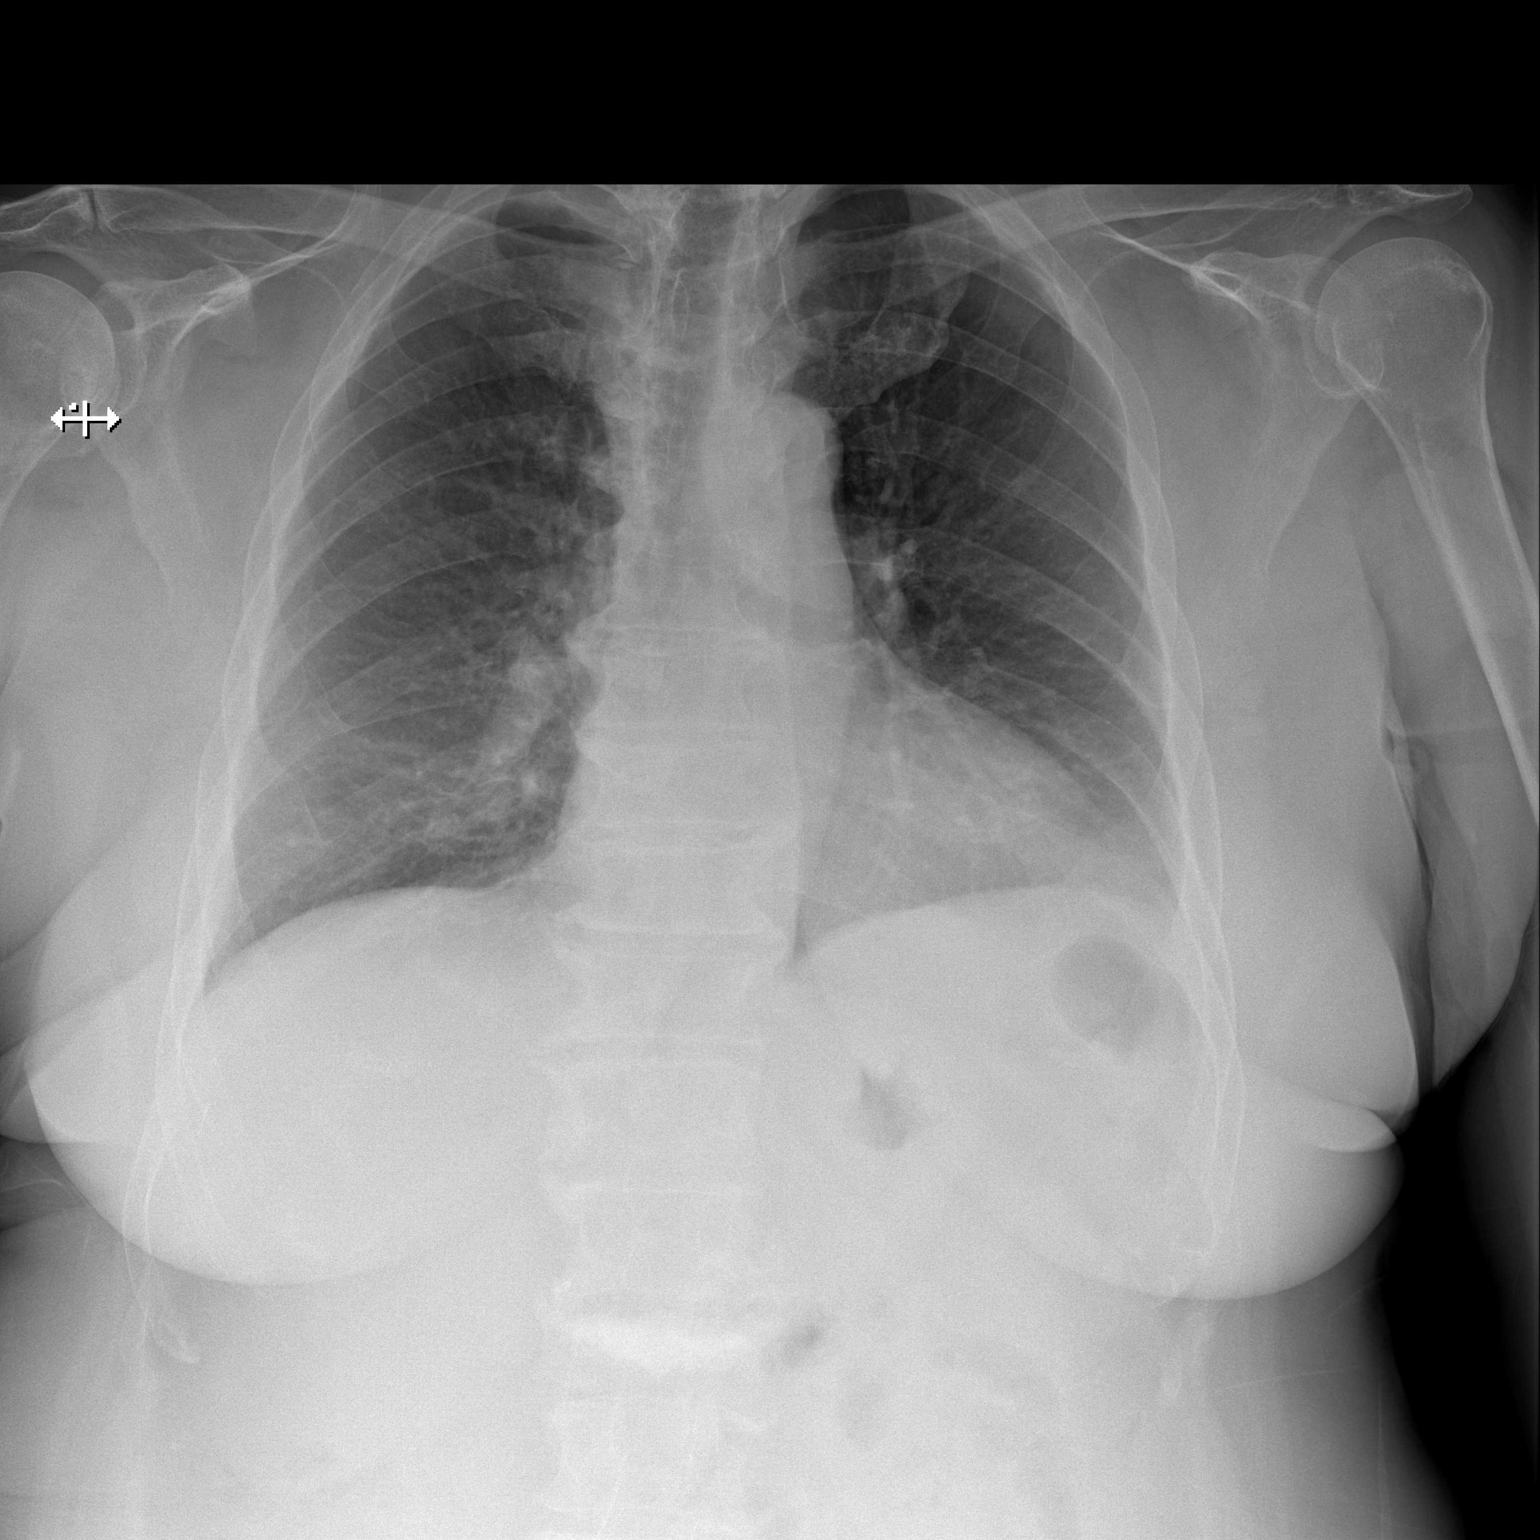
[im 2/2]
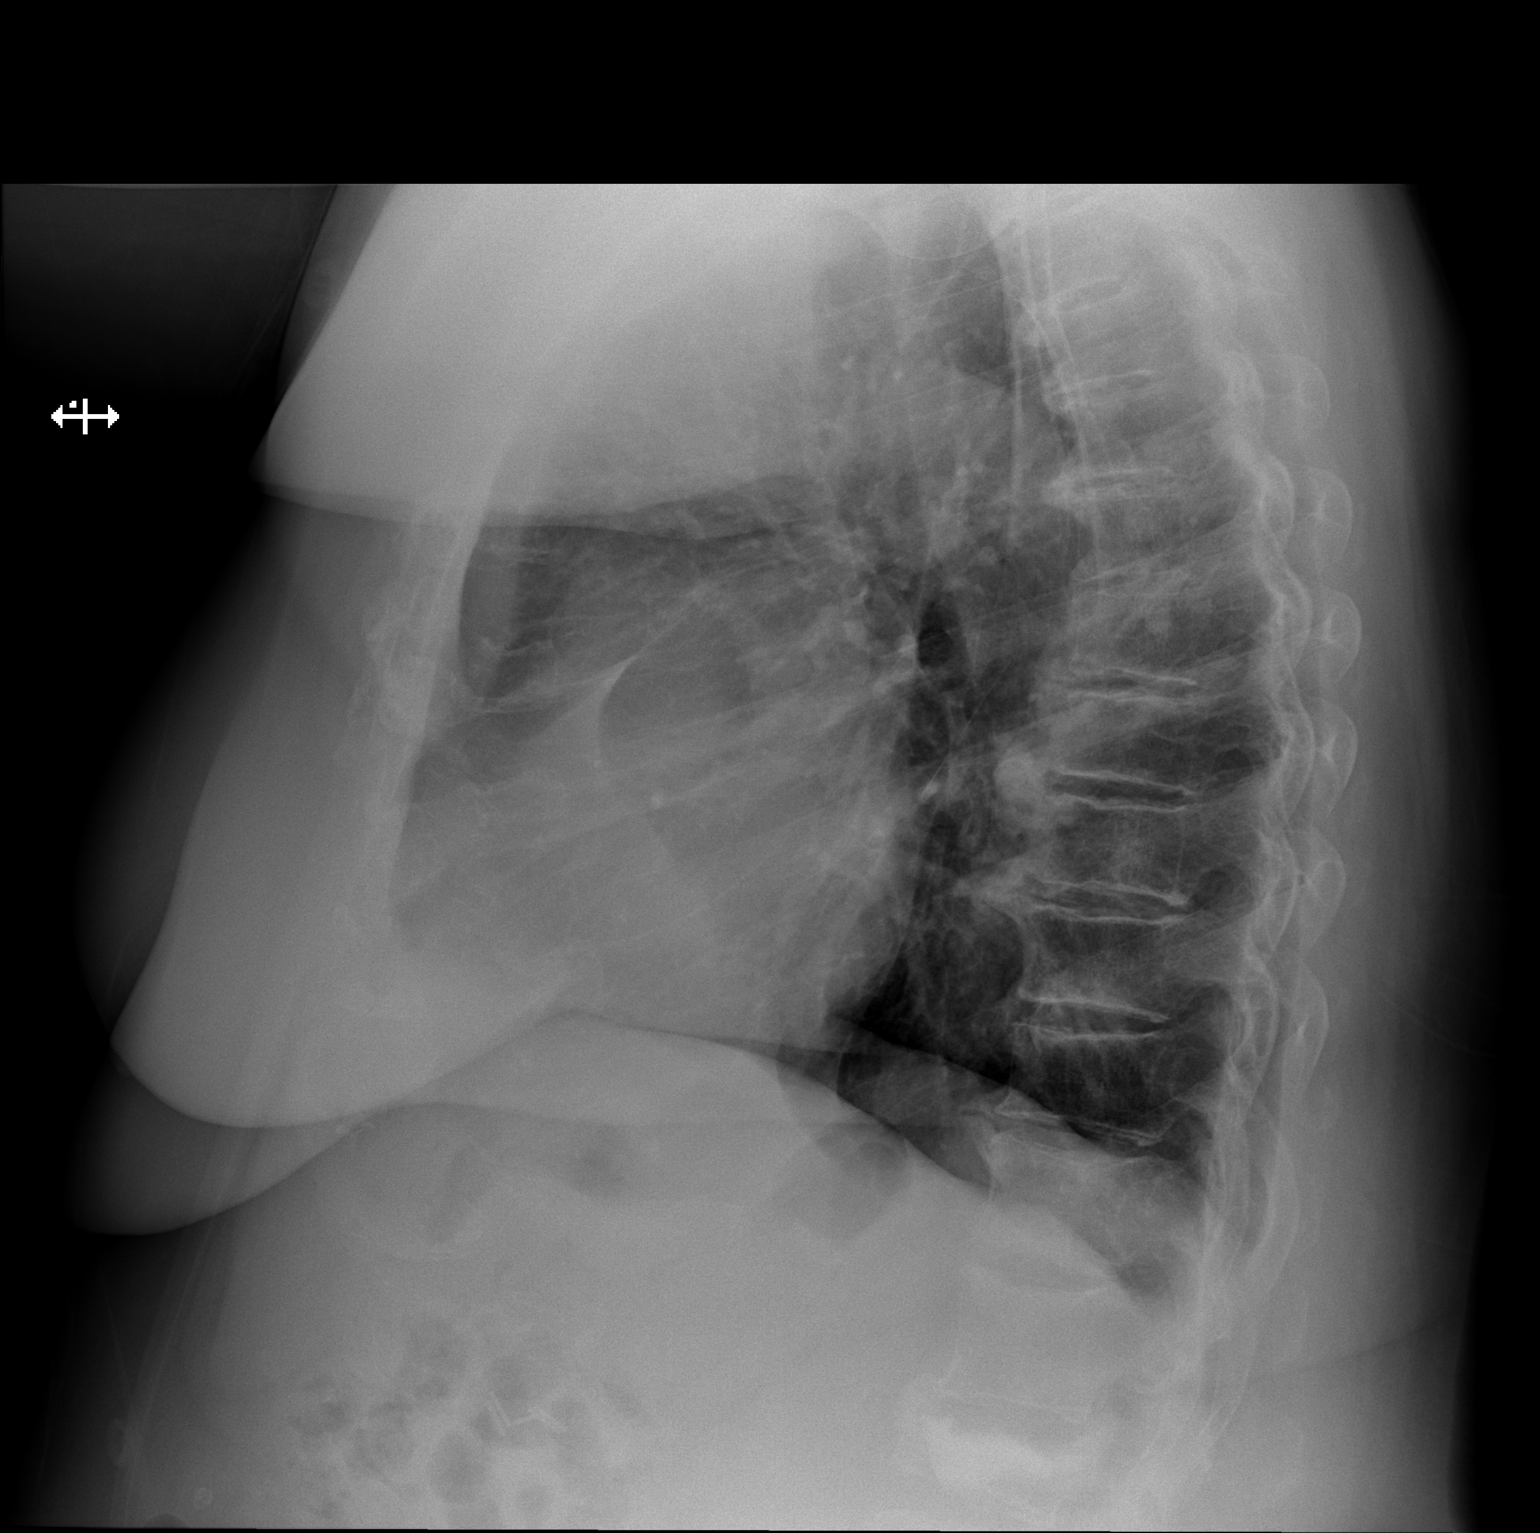

[2 of 2 positions shown; findings below may reference images not displayed]

FINDINGS: Cardiac silhouette is top-normal in size. No mediastinal or hilar
masses or evidence of adenopathy.

Clear lungs.  No pleural effusion or pneumothorax.

Skeletal structures are demineralized. There is a moderate old
compression fracture of the upper lumbar spine treated previously
with vertebroplasty.
IMPRESSION: No active cardiopulmonary disease.

## 2018-08-10 ENCOUNTER — Ambulatory Visit
Admission: RE | Admit: 2018-08-10 | Discharge: 2018-08-10 | Disposition: A | Discharge status: Home / Self Care | Payer: 59 | Source: Ambulatory Visit | Attending: Internal Medicine | Admitting: Internal Medicine

## 2018-08-10 DIAGNOSIS — Z1231 Encounter for screening mammogram for malignant neoplasm of breast: Secondary | ICD-10-CM | POA: Diagnosis present

## 2018-08-11 ENCOUNTER — Encounter: Admission: RE | Payer: Self-pay | Source: Ambulatory Visit

## 2018-08-11 SURGERY — SHOULDER ARTHROSCOPY WITH SUBACROMIAL DECOMPRESSION AND BICEP TENDON REPAIR
Anesthesia: Choice | Laterality: Left

## 2018-08-12 ENCOUNTER — Ambulatory Visit (HOSPITAL_BASED_OUTPATIENT_CLINIC_OR_DEPARTMENT_OTHER): Payer: 59 | Admitting: Student in an Organized Health Care Education/Training Program

## 2018-08-12 ENCOUNTER — Encounter: Payer: Self-pay | Admitting: Student in an Organized Health Care Education/Training Program

## 2018-08-12 ENCOUNTER — Ambulatory Visit
Admission: RE | Admit: 2018-08-12 | Discharge: 2018-08-12 | Disposition: A | Discharge status: Home / Self Care | Payer: 59 | Source: Ambulatory Visit | Attending: Student in an Organized Health Care Education/Training Program | Admitting: Student in an Organized Health Care Education/Training Program

## 2018-08-12 ENCOUNTER — Other Ambulatory Visit: Payer: Self-pay

## 2018-08-12 VITALS — BP 147/99 | HR 81 | Temp 98.2°F | Resp 16 | Ht 67.0 in | Wt 203.0 lb

## 2018-08-12 DIAGNOSIS — M5416 Radiculopathy, lumbar region: Secondary | ICD-10-CM | POA: Insufficient documentation

## 2018-08-12 MED ORDER — LIDOCAINE HCL 2 % IJ SOLN
20.0000 mL | Freq: Once | INTRAMUSCULAR | Status: AC
  Administered 2018-08-12: 400 mg
  Filled 2018-08-12: qty 40

## 2018-08-12 MED ORDER — DEXAMETHASONE SODIUM PHOSPHATE 10 MG/ML IJ SOLN
10.0000 mg | Freq: Once | INTRAMUSCULAR | Status: AC
  Administered 2018-08-12: 10 mg
  Filled 2018-08-12: qty 1

## 2018-08-12 MED ORDER — IOPAMIDOL (ISOVUE-M 200) INJECTION 41%
10.0000 mL | Freq: Once | INTRAMUSCULAR | Status: AC
  Administered 2018-08-12: 10 mL via EPIDURAL
  Filled 2018-08-12: qty 10

## 2018-08-12 MED ORDER — SODIUM CHLORIDE 0.9% FLUSH
2.0000 mL | Freq: Once | INTRAVENOUS | Status: AC
  Administered 2018-08-12: 2 mL

## 2018-08-12 MED ORDER — ROPIVACAINE HCL 2 MG/ML IJ SOLN
2.0000 mL | Freq: Once | INTRAMUSCULAR | Status: AC
  Administered 2018-08-12: 2 mL via EPIDURAL
  Filled 2018-08-12: qty 10

## 2018-08-12 NOTE — Progress Notes (Signed)
Safety precautions to be maintained throughout the outpatient stay will include: orient to surroundings, keep bed in low position, maintain call bell within reach at all times, provide assistance with transfer out of bed and ambulation.  

## 2018-08-12 NOTE — Patient Instructions (Signed)
Pain Management Discharge Instructions  General Discharge Instructions :  If you need to reach your doctor call: Monday-Friday 8:00 am - 4:00 pm at [phone] or toll free [phone].  After clinic hours [phone] to have operator reach doctor.  Bring all of your medication bottles to all your appointments in the pain clinic.  To cancel or reschedule your appointment with Pain Management please remember to call 24 hours in advance to avoid a fee.  Refer to the educational materials which you have been given on: General Risks, I had my Procedure. Discharge Instructions, Post Sedation.  Post Procedure Instructions:  The drugs you were given will stay in your system until tomorrow, so for the next 24 hours you should not drive, make any legal decisions or drink any alcoholic beverages.  You may eat anything you prefer, but it is better to start with liquids then soups and crackers, and gradually work up to solid foods.  Please notify your doctor immediately if you have any unusual bleeding, trouble breathing or pain that is not related to your normal pain.  Depending on the type of procedure that was done, some parts of your body may feel week and/or numb.  This usually clears up by tonight or the next day.  Walk with the use of an assistive device or accompanied by an adult for the 24 hours.  You may use ice on the affected area for the first 24 hours.  Put ice in a Ziploc bag and cover with a towel and place against area 15 minutes on 15 minutes off.  You may switch to heat after 24 hours.Epidural Steroid Injection Patient Information  Description: The epidural space surrounds the nerves as they exit the spinal cord.  In some patients, the nerves can be compressed and inflamed by a bulging disc or a tight spinal canal (spinal stenosis).  By injecting steroids into the epidural space, we can bring irritated nerves into direct contact with a potentially helpful medication.  These  steroids act directly on the irritated nerves and can reduce swelling and inflammation which often leads to decreased pain.  Epidural steroids may be injected anywhere along the spine and from the neck to the low back depending upon the location of your pain.   After numbing the skin with local anesthetic (like Novocaine), a small needle is passed into the epidural space slowly.  You may experience a sensation of pressure while this is being done.  The entire block usually last less than 10 minutes.  Conditions which may be treated by epidural steroids:   Low back and leg pain  Neck and arm pain  Spinal stenosis  Post-laminectomy syndrome  Herpes zoster (shingles) pain  Pain from compression fractures  Preparation for the injection:  1. Do not eat any solid food or dairy products within 8 hours of your appointment.  2. You may drink clear liquids up to 3 hours before appointment.  Clear liquids include water, black coffee, juice or soda.  No milk or cream please. 3. You may take your regular medication, including pain medications, with a sip of water before your appointment  Diabetics should hold regular insulin (if taken separately) and take 1/2 normal NPH dos the morning of the procedure.  Carry some sugar containing items with you to your appointment. 4. A driver must accompany you and be prepared to drive you home after your procedure.  5. Bring all your current medications with your. 6. An IV may be inserted and   sedation may be given at the discretion of the physician.   7. A blood pressure cuff, EKG and other monitors will often be applied during the procedure.  Some patients may need to have extra oxygen administered for a short period. 8. You will be asked to provide medical information, including your allergies, prior to the procedure.  We must know immediately if you are taking blood thinners (like Coumadin/Warfarin)  Or if you are allergic to IV iodine contrast (dye). We must  know if you could possible be pregnant.  Possible side-effects:  Bleeding from needle site  Infection (rare, may require surgery)  Nerve injury (rare)  Numbness & tingling (temporary)  Difficulty urinating (rare, temporary)  Spinal headache ( a headache worse with upright posture)  Light -headedness (temporary)  Pain at injection site (several days)  Decreased blood pressure (temporary)  Weakness in arm/leg (temporary)  Pressure sensation in back/neck (temporary)  Call if you experience:  Fever/chills associated with headache or increased back/neck pain.  Headache worsened by an upright position.  New onset weakness or numbness of an extremity below the injection site  Hives or difficulty breathing (go to the emergency room)  Inflammation or drainage at the infection site  Severe back/neck pain  Any new symptoms which are concerning to you  Please note:  Although the local anesthetic injected can often make your back or neck feel good for several hours after the injection, the pain will likely return.  It takes 3-7 days for steroids to work in the epidural space.  You may not notice any pain relief for at least that one week.  If effective, we will often do a series of three injections spaced 3-6 weeks apart to maximally decrease your pain.  After the initial series, we generally will wait several months before considering a repeat injection of the same type.  If you have any questions, please call [phone] [facility] 

## 2018-08-12 NOTE — Progress Notes (Signed)
Patient's Name: Erika Crawford  MRN: [id number]  Referring Provider: Glendon Axe, MD  DOB: 1940/08/30  PCP: Glendon Axe, MD  DOS: 08/12/2018  Note by: [name], MD  Service setting: Ambulatory outpatient  Specialty: Interventional Pain Management  Patient type: Established  Location: [facility] (AMB) Pain Management Facility  Visit type: Interventional Procedure   Primary Reason for Visit: Interventional Pain Management Treatment. CC: Back Pain (lower)  Procedure:       Anesthesia, Analgesia, Anxiolysis:  Type: Therapeutic Inter-Laminar Epidural Steroid Injection  Region: Lumbar Level: L5-S1 Level. Laterality: Left-Sided         Type: Local Anesthesia Indication(s): Analgesia         Route: Infiltration ([place]/IM) IV Access: Declined Sedation: Declined  Local Anesthetic: Lidocaine 1-2%   Indications: 1. Lumbar radiculopathy    Pain Score: Pre-procedure: 4 /10 Post-procedure: 4 /10  Pre-op Assessment:  Erika Crawford is a 78 y.o. (year old), female patient, seen today for interventional treatment. 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 Erika Crawford has a current medication list which includes the following prescription(s): acetaminophen, alendronate, amlodipine, lidocaine, cholecalciferol, gabapentin, homeopathic products, hydrocodone-acetaminophen, levothyroxine, mometasone, naproxen sodium, olmesartan, and omeprazole. Her primarily concern today is the Back Pain (lower)  Initial Vital Signs:  Pulse Rate: 81 Temp: 98.2 F (36.8 C) Resp: 16 BP: (!) 151/61 SpO2: 100 %  BMI: Estimated body mass index is 31.79 kg/m as calculated from the following:   Height as of this encounter: 5\' 7"  (1.702 m).   Weight as of this encounter: 203 lb (92.1 kg).  Risk Assessment: Allergies: Reviewed. She is allergic to celebrex [celecoxib];  codeine; etodolac; hydrochlorothiazide; oxycodone; pravastatin; relafen [nabumetone]; and tramadol.  Allergy Precautions: None required Coagulopathies: Reviewed. None identified.  Blood-thinner therapy: None at this time Active Infection(s): Reviewed. None identified. Erika Crawford is afebrile  Site Confirmation: Erika Crawford was asked to confirm the procedure and laterality before marking the site Procedure checklist: Completed Consent: Before the procedure and under the influence of no sedative(s), amnesic(s), or anxiolytics, the patient was informed of the treatment options, risks and possible complications. To fulfill our ethical and legal obligations, as recommended by the American Medical Association's Code of Ethics, I have informed the patient of my clinical impression; the nature and purpose of the treatment or procedure; the risks, benefits, and possible complications of the intervention; the alternatives, including doing nothing; the risk(s) and benefit(s) of the alternative treatment(s) or procedure(s); and the risk(s) and benefit(s) of doing nothing. The patient was provided information about the general risks and possible complications associated with the procedure. These may include, but are not limited to: failure to achieve desired goals, infection, bleeding, organ or nerve damage, allergic reactions, paralysis, and death. In addition, the patient was informed of those risks and complications associated to Spine-related procedures, such as failure to decrease pain; infection (i.e.: Meningitis, epidural or intraspinal abscess); bleeding (i.e.: epidural hematoma, subarachnoid hemorrhage, or any other type of intraspinal or peri-dural bleeding); organ or nerve damage (i.e.: Any type of peripheral nerve, nerve root, or spinal cord injury) with subsequent damage to sensory, motor, and/or autonomic systems, resulting in permanent pain, numbness, and/or weakness of one or several areas of the body;  allergic reactions; (i.e.: anaphylactic reaction); and/or death. Furthermore, the patient was informed of those risks and complications associated with the medications. These include, but are not limited to: allergic reactions (  i.e.: anaphylactic or anaphylactoid reaction(s)); adrenal axis suppression; blood sugar elevation that in diabetics may result in ketoacidosis or comma; water retention that in patients with history of congestive heart failure may result in shortness of breath, pulmonary edema, and decompensation with resultant heart failure; weight gain; swelling or edema; medication-induced neural toxicity; particulate matter embolism and blood vessel occlusion with resultant organ, and/or nervous system infarction; and/or aseptic necrosis of one or more joints. Finally, the patient was informed that Medicine is not an exact science; therefore, there is also the possibility of unforeseen or unpredictable risks and/or possible complications that may result in a catastrophic outcome. The patient indicated having understood very clearly. We have given the patient no guarantees and we have made no promises. Enough time was given to the patient to ask questions, all of which were answered to the patient's satisfaction. Erika Crawford has indicated that she wanted to continue with the procedure. Attestation: I, the ordering provider, attest that I have discussed with the patient the benefits, risks, side-effects, alternatives, likelihood of achieving goals, and potential problems during recovery for the procedure that I have provided informed consent. Date  Time: 08/12/2018  8:56 AM  Pre-Procedure Preparation:  Monitoring: As per clinic protocol. Respiration, ETCO2, SpO2, BP, heart rate and rhythm monitor placed and checked for adequate function Safety Precautions: Patient was assessed for positional comfort and pressure points before starting the procedure. Time-out: I initiated and conducted the "Time-out"  before starting the procedure, as per protocol. The patient was asked to participate by confirming the accuracy of the "Time Out" information. Verification of the correct person, site, and procedure were performed and confirmed by me, the nursing staff, and the patient. "Time-out" conducted as per Joint Commission's Universal Protocol (UP.01.01.01). Time: 0928  Description of Procedure:       Position: Prone with head of the table was raised to facilitate breathing. Target Area: The interlaminar space, initially targeting the lower laminar border of the superior vertebral body. Approach: Paramedial approach. Area Prepped: Entire Posterior Lumbar Region Prepping solution: ChloraPrep (2% chlorhexidine gluconate and 70% isopropyl alcohol) Safety Precautions: Aspiration looking for blood return was conducted prior to all injections. At no point did we inject any substances, as a needle was being advanced. No attempts were made at seeking any paresthesias. Safe injection practices and needle disposal techniques used. Medications properly checked for expiration dates. SDV (single dose vial) medications used. Description of the Procedure: Protocol guidelines were followed. The procedure needle was introduced through the skin, ipsilateral to the reported pain, and advanced to the target area. Bone was contacted and the needle walked caudad, until the lamina was cleared. The epidural space was identified using "loss-of-resistance technique" with 2-3 ml of PF-NaCl (0.9% NSS), in a 5cc LOR glass syringe. Vitals:   08/12/18 0921 08/12/18 0925 08/12/18 0930 08/12/18 0935  BP: (!) 142/74 (!) 155/62 (!) 155/62 (!) 147/99  Pulse:      Resp: 14 16 15 16   Temp:      TempSrc:      SpO2: 98% 95% 99% 100%  Weight:      Height:        Start Time: 0928 hrs. End Time: 0934 hrs. Materials:  Needle(s) Type: Epidural needle Gauge: 17G Length: 5-in Medication(s): Please see orders for medications and dosing  details. 8 CC solution made of 5 cc of preservative-free saline, 2 cc of 0.2% ropivacaine, 1 cc of Decadron 10 mill grams per cc Imaging Guidance (Spinal):  Type of Imaging  Technique: Fluoroscopy Guidance (Spinal) Indication(s): Assistance in needle guidance and placement for procedures requiring needle placement in or near specific anatomical locations not easily accessible without such assistance. Exposure Time: Please see nurses notes. Contrast: Before injecting any contrast, we confirmed that the patient did not have an allergy to iodine, shellfish, or radiological contrast. Once satisfactory needle placement was completed at the desired level, radiological contrast was injected. Contrast injected under live fluoroscopy. No contrast complications. See chart for type and volume of contrast used. Fluoroscopic Guidance: I was personally present during the use of fluoroscopy. "Tunnel Vision Technique" used to obtain the best possible view of the target area. Parallax error corrected before commencing the procedure. "Direction-depth-direction" technique used to introduce the needle under continuous pulsed fluoroscopy. Once target was reached, antero-posterior, oblique, and lateral fluoroscopic projection used confirm needle placement in all planes. Images permanently stored in EMR. Interpretation: I personally interpreted the imaging intraoperatively. Adequate needle placement confirmed in multiple planes. Appropriate spread of contrast into desired area was observed. No evidence of afferent or efferent intravascular uptake. No intrathecal or subarachnoid spread observed. Permanent images saved into the patient's record.  Antibiotic Prophylaxis:   Anti-infectives (From admission, onward)   None     Indication(s): None identified  Post-operative Assessment:  Post-procedure Vital Signs:  Pulse Rate: 81 Temp: 98.2 F (36.8 C) Resp: 16 BP: (!) 147/99 SpO2: 100 %  EBL: None  Complications: No  immediate post-treatment complications observed by team, or reported by patient.  Note: The patient tolerated the entire procedure well. A repeat set of vitals were taken after the procedure and the patient was kept under observation following institutional policy, for this type of procedure. Post-procedural neurological assessment was performed, showing return to baseline, prior to discharge. The patient was provided with post-procedure discharge instructions, including a section on how to identify potential problems. Should any problems arise concerning this procedure, the patient was given instructions to immediately contact us, at any time, without hesitation. In any case, we plan to contact the patient by telephone for a follow-up status report regarding this interventional procedure.  Comments:  No additional relevant information.  5 out of 5 strength bilateral lower extremity: Plantar flexion, dorsiflexion, knee flexion, knee extension.  Plan of Care    Imaging Orders     DG C-Arm 1-60 Min-No Report Procedure Orders    No procedure(s) ordered today    Medications ordered for procedure: Meds ordered this encounter  Medications  . sodium chloride flush (NS) 0.9 % injection 2 mL  . ropivacaine (PF) 2 mg/mL (0.2%) (NAROPIN) injection 2 mL  . dexamethasone (DECADRON) injection 10 mg  . iopamidol (ISOVUE-M) 41 % intrathecal injection 10 mL    Must be Myelogram-compatible. If not available, you may substitute with a water-soluble, non-ionic, hypoallergenic, myelogram-compatible radiological contrast medium.  Marland Kitchen lidocaine (XYLOCAINE) 2 % (with pres) injection 400 mg   Medications administered: We administered sodium chloride flush, ropivacaine (PF) 2 mg/mL (0.2%), dexamethasone, iopamidol, and lidocaine.  See the medical record for exact dosing, route, and time of administration.  New Prescriptions   No medications on file   Disposition: Discharge home  Discharge Date & Time:  08/12/2018; 0945 hrs.   Physician-requested Follow-up: Return in about 6 weeks (around 09/23/2018) for Post Procedure Evaluation.  Future Appointments  Date Time Provider [place]  09/16/2018  1:30 PM [name], MD [facility] None   Primary Care Physician: Glendon Axe, MD Location: [facility] Outpatient Pain Management Facility Note by: [name], MD  Date: 08/12/2018; Time: 10:06 AM  Disclaimer:  Medicine is not an Chief Strategy Officer. The only guarantee in medicine is that nothing is guaranteed. It is important to note that the decision to proceed with this intervention was based on the information collected from the patient. The Data and conclusions were drawn from the patient's questionnaire, the interview, and the physical examination. Because the information was provided in large part by the patient, it cannot be guaranteed that it has not been purposely or unconsciously manipulated. Every effort has been made to obtain as much relevant data as possible for this evaluation. It is important to note that the conclusions that lead to this procedure are derived in large part from the available data. Always take into account that the treatment will also be dependent on availability of resources and existing treatment guidelines, considered by other Pain Management Practitioners as being common knowledge and practice, at the time of the intervention. For Medico-Legal purposes, it is also important to point out that variation in procedural techniques and pharmacological choices are the acceptable norm. The indications, contraindications, technique, and results of the above procedure should only be interpreted and judged by a Board-Certified Interventional Pain Specialist with extensive familiarity and expertise in the same exact procedure and technique.

## 2018-08-24 ENCOUNTER — Encounter
Admission: RE | Admit: 2018-08-24 | Discharge: 2018-08-24 | Disposition: A | Discharge status: Home / Self Care | Payer: 59 | Source: Ambulatory Visit | Attending: Surgery | Admitting: Surgery

## 2018-08-24 ENCOUNTER — Other Ambulatory Visit: Payer: Self-pay

## 2018-08-24 DIAGNOSIS — Z01812 Encounter for preprocedural laboratory examination: Secondary | ICD-10-CM | POA: Insufficient documentation

## 2018-08-24 LAB — URINALYSIS, ROUTINE W REFLEX MICROSCOPIC
Bilirubin Urine: NEGATIVE
Glucose, UA: NEGATIVE mg/dL
Hgb urine dipstick: NEGATIVE
Ketones, ur: NEGATIVE mg/dL
Leukocytes,Ua: NEGATIVE
Nitrite: NEGATIVE
Protein, ur: NEGATIVE mg/dL
Specific Gravity, Urine: 1.014 (ref 1.005–1.030)
pH: 6 (ref 5.0–8.0)

## 2018-08-24 LAB — CBC
HCT: 38.7 % (ref 36.0–46.0)
HEMOGLOBIN: 12.9 g/dL (ref 12.0–15.0)
MCH: 32.3 pg (ref 26.0–34.0)
MCHC: 33.3 g/dL (ref 30.0–36.0)
MCV: 97 fL (ref 80.0–100.0)
Platelets: 192 10*3/uL (ref 150–400)
RBC: 3.99 MIL/uL (ref 3.87–5.11)
RDW: 14.5 % (ref 11.5–15.5)
WBC: 6.5 10*3/uL (ref 4.0–10.5)
nRBC: 0 % (ref 0.0–0.2)

## 2018-08-24 LAB — TYPE AND SCREEN
ABO/RH(D): O POS
Antibody Screen: NEGATIVE

## 2018-08-24 LAB — BASIC METABOLIC PANEL
ANION GAP: 9 (ref 5–15)
BUN: 15 mg/dL (ref 8–23)
CO2: 25 mmol/L (ref 22–32)
Calcium: 9.1 mg/dL (ref 8.9–10.3)
Chloride: 107 mmol/L (ref 98–111)
Creatinine, Ser: 0.58 mg/dL (ref 0.44–1.00)
GFR calc non Af Amer: >60 mL/min (ref >60)
Glucose, Bld: 103 mg/dL — ABNORMAL HIGH (ref 70–99)
POTASSIUM: 3.5 mmol/L (ref 3.5–5.1)
Sodium: 141 mmol/L (ref 135–145)

## 2018-08-24 LAB — SURGICAL PCR SCREEN
MRSA, PCR: NEGATIVE
STAPHYLOCOCCUS AUREUS: NEGATIVE

## 2018-08-24 MED ORDER — CEFAZOLIN SODIUM-DEXTROSE 2-4 GM/100ML-% IV SOLN
2.0000 g | Freq: Once | INTRAVENOUS | Status: AC
  Administered 2018-08-25: 2 g via INTRAVENOUS

## 2018-08-24 NOTE — Patient Instructions (Signed)
Your procedure is scheduled on: Tues 08/25/2018 Report to Day Surgery 2nd floor [facility]. To find out your arrival time please call [phone] between 1PM - 3PM Monday 08/24/2018  Remember: Instructions that are not followed completely may result in serious medical risk,  up to and including death, or upon the discretion of your surgeon and anesthesiologist your  surgery may need to be rescheduled.     _X__ 1. Do not eat food after midnight the night before your procedure.                 No gum chewing or hard candies. You may drink clear liquids up to 2 hours                 before you are scheduled to arrive for your surgery- DO not drink clear                 liquids within 2 hours of the start of your surgery.                 Clear Liquids include:  water, apple juice without pulp, clear carbohydrate                 drink such as Clearfast of Gatorade, Black Coffee or Tea (Do not add                 anything to coffee or tea).  __X__2.  On the morning of surgery brush your teeth with toothpaste and water, you                may rinse your mouth with mouthwash if you wish.  Do not swallow any toothpaste of mouthwash.     _X__ 3.  No Alcohol for 24 hours before or after surgery.   _X__ 4.  Do Not Smoke or use e-cigarettes For 24 Hours Prior to Your Surgery.                 Do not use any chewable tobacco products for at least 6 hours prior to                 surgery.  ____  5.  Bring all medications with you on the day of surgery if instructed.   __x__  6.  Notify your doctor if there is any change in your medical condition      (cold, fever, infections).     Do not wear jewelry, make-up, hairpins, clips or nail polish. Do not wear lotions, powders, or perfumes. You may wear deodorant. Do not shave 48 hours prior to surgery. Men may shave face and neck. Do not bring valuables to the hospital.    [facility] is not responsible for any belongings or  valuables.  Contacts, dentures or bridgework may not be worn into surgery. Leave your suitcase in the car. After surgery it may be brought to your room. For patients admitted to the hospital, discharge time is determined by your treatment team.   Patients discharged the day of surgery will not be allowed to drive home.   Please read over the following fact sheets that you were given:   __x__ Take these medicines the morning of surgery with A SIP OF WATER:    1. Amlodipine (Norvasc)  2. Levothyroxine (Synthroid)  3. Omeprazole (Prilosec)  4.  5.  6.  ____ Fleet Enema (as directed)   __x__ Use CHG Soap as directed  ____ Use  inhalers on the day of surgery  ____ Stop metformin 2 days prior to surgery    ____ Take 1/2 of usual insulin dose the night before surgery. No insulin the morning          of surgery.   ____ Stop Coumadin/Plavix/aspirin on   __x__ Stop Anti-inflammatories take only tylenol   ____ Stop supplements until after surgery.    __x__ Bring C-Pap to the hospital.   Reviewed updated instructions with patient 08/24/2018 @ 10:00 am DRiguera RN

## 2018-08-25 ENCOUNTER — Inpatient Hospital Stay: Payer: 59 | Admitting: Anesthesiology

## 2018-08-25 ENCOUNTER — Inpatient Hospital Stay: Payer: 59

## 2018-08-25 ENCOUNTER — Inpatient Hospital Stay
Admission: RE | Admit: 2018-08-25 | Discharge: 2018-08-26 | DRG: 483 | Disposition: A | Discharge status: Home Health | Payer: 59 | Attending: Surgery | Admitting: Surgery

## 2018-08-25 ENCOUNTER — Encounter: Payer: Self-pay | Admitting: Emergency Medicine

## 2018-08-25 ENCOUNTER — Encounter
Admission: RE | Disposition: A | Discharge status: Home Health | Payer: Self-pay | Source: Home / Self Care | Attending: Surgery

## 2018-08-25 DIAGNOSIS — Z79890 Hormone replacement therapy (postmenopausal): Secondary | ICD-10-CM

## 2018-08-25 DIAGNOSIS — E876 Hypokalemia: Secondary | ICD-10-CM | POA: Diagnosis present

## 2018-08-25 DIAGNOSIS — Z6831 Body mass index (BMI) 31.0-31.9, adult: Secondary | ICD-10-CM | POA: Diagnosis not present

## 2018-08-25 DIAGNOSIS — Z79899 Other long term (current) drug therapy: Secondary | ICD-10-CM

## 2018-08-25 DIAGNOSIS — E039 Hypothyroidism, unspecified: Secondary | ICD-10-CM | POA: Diagnosis present

## 2018-08-25 DIAGNOSIS — I10 Essential (primary) hypertension: Secondary | ICD-10-CM | POA: Diagnosis present

## 2018-08-25 DIAGNOSIS — E669 Obesity, unspecified: Secondary | ICD-10-CM | POA: Diagnosis present

## 2018-08-25 DIAGNOSIS — M75101 Unspecified rotator cuff tear or rupture of right shoulder, not specified as traumatic: Secondary | ICD-10-CM | POA: Diagnosis present

## 2018-08-25 DIAGNOSIS — G4730 Sleep apnea, unspecified: Secondary | ICD-10-CM | POA: Diagnosis present

## 2018-08-25 DIAGNOSIS — Z96611 Presence of right artificial shoulder joint: Secondary | ICD-10-CM

## 2018-08-25 DIAGNOSIS — M19011 Primary osteoarthritis, right shoulder: Principal | ICD-10-CM | POA: Diagnosis present

## 2018-08-25 HISTORY — PX: REVERSE SHOULDER ARTHROPLASTY: SHX5054

## 2018-08-25 LAB — URINE CULTURE: Culture: NO GROWTH

## 2018-08-25 SURGERY — ARTHROPLASTY, SHOULDER, TOTAL, REVERSE
Anesthesia: General | Site: Shoulder | Laterality: Right

## 2018-08-25 SURGERY — SHOULDER ARTHROSCOPY WITH SUBACROMIAL DECOMPRESSION AND BICEP TENDON REPAIR
Anesthesia: Choice | Laterality: Left

## 2018-08-25 MED ORDER — SCOPOLAMINE 1 MG/3DAYS TD PT72
1.0000 | MEDICATED_PATCH | TRANSDERMAL | Status: DC
  Administered 2018-08-25: 1.5 mg via TRANSDERMAL
  Filled 2018-08-25: qty 1

## 2018-08-25 MED ORDER — VITAMIN D 25 MCG (1000 UNIT) PO TABS
2000.0000 [IU] | ORAL_TABLET | Freq: Every day | ORAL | Status: DC
  Administered 2018-08-26: 2000 [IU] via ORAL
  Filled 2018-08-25: qty 2

## 2018-08-25 MED ORDER — METOCLOPRAMIDE HCL 10 MG PO TABS: 5.0000 mg | ORAL_TABLET | Freq: Three times a day (TID) | ORAL | Status: DC | PRN

## 2018-08-25 MED ORDER — PROPOFOL 10 MG/ML IV BOLUS
INTRAVENOUS | Status: AC
  Filled 2018-08-25: qty 20

## 2018-08-25 MED ORDER — LEVOTHYROXINE SODIUM 88 MCG PO TABS
88.0000 ug | ORAL_TABLET | Freq: Every day | ORAL | Status: DC
  Administered 2018-08-26: 88 ug via ORAL
  Filled 2018-08-25: qty 1

## 2018-08-25 MED ORDER — FLEET ENEMA 7-19 GM/118ML RE ENEM: 1.0000 | ENEMA | Freq: Once | RECTAL | Status: DC | PRN

## 2018-08-25 MED ORDER — BUPIVACAINE HCL (PF) 0.5 % IJ SOLN
INTRAMUSCULAR | Status: DC | PRN
  Administered 2018-08-25: 10 mL via PERINEURAL

## 2018-08-25 MED ORDER — ACETAMINOPHEN 325 MG PO TABS: 325.0000 mg | ORAL_TABLET | Freq: Four times a day (QID) | ORAL | Status: DC | PRN

## 2018-08-25 MED ORDER — AMLODIPINE BESYLATE 5 MG PO TABS
5.0000 mg | ORAL_TABLET | Freq: Every day | ORAL | Status: DC
  Administered 2018-08-26: 5 mg via ORAL
  Filled 2018-08-25: qty 1

## 2018-08-25 MED ORDER — HYDROCODONE-ACETAMINOPHEN 5-325 MG PO TABS
1.0000 | ORAL_TABLET | ORAL | Status: DC | PRN
  Administered 2018-08-26: 1 via ORAL
  Filled 2018-08-25 (×2): qty 1

## 2018-08-25 MED ORDER — NEOMYCIN-POLYMYXIN B GU 40-200000 IR SOLN
Status: AC
  Filled 2018-08-25: qty 1

## 2018-08-25 MED ORDER — BUPIVACAINE HCL (PF) 0.5 % IJ SOLN
INTRAMUSCULAR | Status: AC
  Filled 2018-08-25: qty 30

## 2018-08-25 MED ORDER — SUGAMMADEX SODIUM 200 MG/2ML IV SOLN
INTRAVENOUS | Status: DC | PRN
  Administered 2018-08-25: 200 mg via INTRAVENOUS

## 2018-08-25 MED ORDER — FENTANYL CITRATE (PF) 100 MCG/2ML IJ SOLN: 25.0000 ug | INTRAMUSCULAR | Status: DC | PRN

## 2018-08-25 MED ORDER — ONDANSETRON HCL 4 MG/2ML IJ SOLN
INTRAMUSCULAR | Status: AC
  Filled 2018-08-25: qty 2

## 2018-08-25 MED ORDER — LIDOCAINE HCL (PF) 4 % IJ SOLN
INTRAMUSCULAR | Status: DC | PRN
  Administered 2018-08-25: 4 mL via RESPIRATORY_TRACT

## 2018-08-25 MED ORDER — CEFAZOLIN SODIUM-DEXTROSE 2-4 GM/100ML-% IV SOLN
2.0000 g | Freq: Four times a day (QID) | INTRAVENOUS | Status: AC
  Administered 2018-08-25 – 2018-08-26 (×3): 2 g via INTRAVENOUS
  Filled 2018-08-25 (×3): qty 100

## 2018-08-25 MED ORDER — ROCURONIUM BROMIDE 50 MG/5ML IV SOLN
INTRAVENOUS | Status: AC
  Filled 2018-08-25: qty 2

## 2018-08-25 MED ORDER — BISACODYL 10 MG RE SUPP: 10.0000 mg | Freq: Every day | RECTAL | Status: DC | PRN

## 2018-08-25 MED ORDER — BUPIVACAINE LIPOSOME 1.3 % IJ SUSP
INTRAMUSCULAR | Status: AC
  Filled 2018-08-25: qty 20

## 2018-08-25 MED ORDER — GLYCOPYRROLATE 0.2 MG/ML IJ SOLN
INTRAMUSCULAR | Status: AC
  Filled 2018-08-25: qty 1

## 2018-08-25 MED ORDER — BUPIVACAINE HCL (PF) 0.5 % IJ SOLN
INTRAMUSCULAR | Status: AC
  Filled 2018-08-25: qty 10

## 2018-08-25 MED ORDER — FENTANYL CITRATE (PF) 100 MCG/2ML IJ SOLN
INTRAMUSCULAR | Status: DC | PRN
  Administered 2018-08-25: 25 ug via INTRAVENOUS
  Administered 2018-08-25 (×3): 50 ug via INTRAVENOUS
  Administered 2018-08-25: 25 ug via INTRAVENOUS
  Administered 2018-08-25: 50 ug via INTRAVENOUS

## 2018-08-25 MED ORDER — DEXAMETHASONE SODIUM PHOSPHATE 4 MG/ML IJ SOLN
INTRAMUSCULAR | Status: DC | PRN
  Administered 2018-08-25: 5 mg via INTRAVENOUS

## 2018-08-25 MED ORDER — FLUTICASONE PROPIONATE 50 MCG/ACT NA SUSP: 2.0000 | Freq: Every day | NASAL | Status: DC | PRN

## 2018-08-25 MED ORDER — LACTATED RINGERS IV SOLN
INTRAVENOUS | Status: DC
  Administered 2018-08-25 (×2): via INTRAVENOUS

## 2018-08-25 MED ORDER — FENTANYL CITRATE (PF) 100 MCG/2ML IJ SOLN
50.0000 ug | Freq: Once | INTRAMUSCULAR | Status: AC
  Administered 2018-08-25: 50 ug via INTRAVENOUS

## 2018-08-25 MED ORDER — METOCLOPRAMIDE HCL 5 MG/ML IJ SOLN
5.0000 mg | Freq: Three times a day (TID) | INTRAMUSCULAR | Status: DC | PRN
  Administered 2018-08-25: 10 mg via INTRAVENOUS
  Filled 2018-08-25: qty 2

## 2018-08-25 MED ORDER — PHENYLEPHRINE HCL 10 MG/ML IJ SOLN
INTRAMUSCULAR | Status: AC
  Filled 2018-08-25: qty 2

## 2018-08-25 MED ORDER — LIDOCAINE HCL (PF) 1 % IJ SOLN
INTRAMUSCULAR | Status: AC
  Filled 2018-08-25: qty 5

## 2018-08-25 MED ORDER — DIPHENHYDRAMINE HCL 12.5 MG/5ML PO ELIX: 12.5000 mg | ORAL_SOLUTION | ORAL | Status: DC | PRN

## 2018-08-25 MED ORDER — SUGAMMADEX SODIUM 200 MG/2ML IV SOLN
INTRAVENOUS | Status: AC
  Filled 2018-08-25: qty 2

## 2018-08-25 MED ORDER — DOCUSATE SODIUM 100 MG PO CAPS
100.0000 mg | ORAL_CAPSULE | Freq: Two times a day (BID) | ORAL | Status: DC
  Administered 2018-08-25 – 2018-08-26 (×2): 100 mg via ORAL
  Filled 2018-08-25 (×2): qty 1

## 2018-08-25 MED ORDER — LIDOCAINE HCL (PF) 1 % IJ SOLN
INTRAMUSCULAR | Status: DC | PRN
  Administered 2018-08-25: 3 mL

## 2018-08-25 MED ORDER — MIDAZOLAM HCL 2 MG/2ML IJ SOLN
INTRAMUSCULAR | Status: DC | PRN
  Administered 2018-08-25: 2 mg via INTRAVENOUS

## 2018-08-25 MED ORDER — PROMETHAZINE HCL 25 MG RE SUPP
25.0000 mg | Freq: Once | RECTAL | Status: AC
  Administered 2018-08-25: 25 mg via RECTAL
  Filled 2018-08-25: qty 1

## 2018-08-25 MED ORDER — EPINEPHRINE PF 1 MG/ML IJ SOLN
INTRAMUSCULAR | Status: AC
  Filled 2018-08-25: qty 1

## 2018-08-25 MED ORDER — ENOXAPARIN SODIUM 40 MG/0.4ML ~~LOC~~ SOLN
40.0000 mg | SUBCUTANEOUS | Status: DC
  Administered 2018-08-26: 40 mg via SUBCUTANEOUS
  Filled 2018-08-25: qty 0.4

## 2018-08-25 MED ORDER — ONDANSETRON HCL 4 MG/2ML IJ SOLN: 4.0000 mg | Freq: Four times a day (QID) | INTRAMUSCULAR | Status: DC | PRN

## 2018-08-25 MED ORDER — SODIUM CHLORIDE 0.9 % IV SOLN
INTRAVENOUS | Status: DC
  Administered 2018-08-25 – 2018-08-26 (×2): via INTRAVENOUS

## 2018-08-25 MED ORDER — PROPOFOL 10 MG/ML IV BOLUS
INTRAVENOUS | Status: DC | PRN
  Administered 2018-08-25: 30 mg via INTRAVENOUS
  Administered 2018-08-25: 130 mg via INTRAVENOUS
  Administered 2018-08-25: 30 mg via INTRAVENOUS

## 2018-08-25 MED ORDER — LIDOCAINE HCL (PF) 2 % IJ SOLN
INTRAMUSCULAR | Status: AC
  Filled 2018-08-25: qty 20

## 2018-08-25 MED ORDER — LIDOCAINE HCL (CARDIAC) PF 100 MG/5ML IV SOSY
PREFILLED_SYRINGE | INTRAVENOUS | Status: DC | PRN
  Administered 2018-08-25: 100 mg via INTRAVENOUS

## 2018-08-25 MED ORDER — BUPIVACAINE-EPINEPHRINE (PF) 0.5% -1:200000 IJ SOLN
INTRAMUSCULAR | Status: DC | PRN
  Administered 2018-08-25: 30 mL

## 2018-08-25 MED ORDER — MIDAZOLAM HCL 2 MG/2ML IJ SOLN
INTRAMUSCULAR | Status: AC
  Filled 2018-08-25: qty 2

## 2018-08-25 MED ORDER — SODIUM CHLORIDE (PF) 0.9 % IJ SOLN
INTRAMUSCULAR | Status: AC
  Filled 2018-08-25: qty 50

## 2018-08-25 MED ORDER — SODIUM CHLORIDE 0.9 % IV SOLN
8.0000 mg | Freq: Four times a day (QID) | INTRAVENOUS | Status: DC | PRN
  Filled 2018-08-25: qty 4

## 2018-08-25 MED ORDER — ROCURONIUM BROMIDE 100 MG/10ML IV SOLN
INTRAVENOUS | Status: DC | PRN
  Administered 2018-08-25: 100 mg via INTRAVENOUS

## 2018-08-25 MED ORDER — NAPHAZOLINE-PHENIRAMINE 0.025-0.3 % OP SOLN: Freq: Every day | OPHTHALMIC | Status: DC | PRN

## 2018-08-25 MED ORDER — METHOCARBAMOL 1000 MG/10ML IJ SOLN
500.0000 mg | Freq: Four times a day (QID) | INTRAVENOUS | Status: DC | PRN
  Filled 2018-08-25: qty 5

## 2018-08-25 MED ORDER — ONDANSETRON HCL 4 MG/2ML IJ SOLN
4.0000 mg | Freq: Once | INTRAMUSCULAR | Status: AC | PRN
  Administered 2018-08-25: 4 mg via INTRAVENOUS

## 2018-08-25 MED ORDER — METHOCARBAMOL 500 MG PO TABS: 500.0000 mg | ORAL_TABLET | Freq: Four times a day (QID) | ORAL | Status: DC | PRN

## 2018-08-25 MED ORDER — IRBESARTAN 150 MG PO TABS
300.0000 mg | ORAL_TABLET | Freq: Every day | ORAL | Status: DC
  Administered 2018-08-26: 300 mg via ORAL
  Filled 2018-08-25: qty 2

## 2018-08-25 MED ORDER — ONDANSETRON HCL 4 MG/2ML IJ SOLN
4.0000 mg | Freq: Once | INTRAMUSCULAR | Status: AC
  Administered 2018-08-25: 4 mg via INTRAVENOUS
  Filled 2018-08-25: qty 2

## 2018-08-25 MED ORDER — MAGNESIUM HYDROXIDE 400 MG/5ML PO SUSP: 30.0000 mL | Freq: Every day | ORAL | Status: DC | PRN

## 2018-08-25 MED ORDER — FENTANYL CITRATE (PF) 250 MCG/5ML IJ SOLN
INTRAMUSCULAR | Status: AC
  Filled 2018-08-25: qty 5

## 2018-08-25 MED ORDER — CEFAZOLIN SODIUM-DEXTROSE 2-4 GM/100ML-% IV SOLN
INTRAVENOUS | Status: AC
  Filled 2018-08-25: qty 100

## 2018-08-25 MED ORDER — TRANEXAMIC ACID 1000 MG/10ML IV SOLN
INTRAVENOUS | Status: AC
  Filled 2018-08-25: qty 10

## 2018-08-25 MED ORDER — PHENYLEPHRINE HCL-NACL 10-0.9 MG/250ML-% IV SOLN
INTRAVENOUS | Status: DC | PRN
  Administered 2018-08-25: 10 ug/min via INTRAVENOUS

## 2018-08-25 MED ORDER — BUPIVACAINE LIPOSOME 1.3 % IJ SUSP
INTRAMUSCULAR | Status: DC | PRN
  Administered 2018-08-25: 20 mL via PERINEURAL

## 2018-08-25 MED ORDER — PANTOPRAZOLE SODIUM 40 MG PO TBEC
40.0000 mg | DELAYED_RELEASE_TABLET | Freq: Every day | ORAL | Status: DC
  Administered 2018-08-26: 40 mg via ORAL
  Filled 2018-08-25: qty 1

## 2018-08-25 MED ORDER — ONDANSETRON HCL 4 MG PO TABS: 4.0000 mg | ORAL_TABLET | Freq: Four times a day (QID) | ORAL | Status: DC | PRN

## 2018-08-25 MED ORDER — ESMOLOL HCL 100 MG/10ML IV SOLN
INTRAVENOUS | Status: DC | PRN
  Administered 2018-08-25: 50 mg via INTRAVENOUS

## 2018-08-25 MED ORDER — ACETAMINOPHEN 500 MG PO TABS
500.0000 mg | ORAL_TABLET | Freq: Four times a day (QID) | ORAL | Status: AC
  Administered 2018-08-25 – 2018-08-26 (×2): 500 mg via ORAL
  Filled 2018-08-25 (×2): qty 1

## 2018-08-25 MED ORDER — ONDANSETRON HCL 4 MG/2ML IJ SOLN
INTRAMUSCULAR | Status: DC | PRN
  Administered 2018-08-25: 4 mg via INTRAVENOUS

## 2018-08-25 MED ORDER — DEXAMETHASONE SODIUM PHOSPHATE 10 MG/ML IJ SOLN
INTRAMUSCULAR | Status: AC
  Filled 2018-08-25: qty 1

## 2018-08-25 MED ORDER — FENTANYL CITRATE (PF) 100 MCG/2ML IJ SOLN
INTRAMUSCULAR | Status: AC
  Administered 2018-08-25: 50 ug via INTRAVENOUS
  Filled 2018-08-25: qty 2

## 2018-08-25 MED ORDER — NEOMYCIN-POLYMYXIN B GU 40-200000 IR SOLN
Status: DC | PRN
  Administered 2018-08-25: 14 mL

## 2018-08-25 MED ORDER — TRANEXAMIC ACID 1000 MG/10ML IV SOLN
INTRAVENOUS | Status: DC | PRN
  Administered 2018-08-25: 1000 mg

## 2018-08-25 SURGICAL SUPPLY — 66 items
BASEPLATE GLENOSPHERE 25 (Plate) ×2 IMPLANT
BEARING HUMERAL SHLDER 36M STD (Shoulder) ×1 IMPLANT
BIT DRILL TWIST 2.7 (BIT) ×2 IMPLANT
BLADE SAGITTAL AGGR TOOTH XLG (BLADE) ×2 IMPLANT
BLADE SAGITTAL WIDE XTHICK NO (BLADE) IMPLANT
CANISTER SUCT 1200ML W/VALVE (MISCELLANEOUS) ×2 IMPLANT
CANISTER SUCT 3000ML PPV (MISCELLANEOUS) ×4 IMPLANT
CHLORAPREP W/TINT 26 (MISCELLANEOUS) ×2 IMPLANT
COOLER POLAR GLACIER W/PUMP (MISCELLANEOUS) ×2 IMPLANT
COVER WAND RF STERILE (DRAPES) ×2 IMPLANT
CRADLE LAMINECT ARM (MISCELLANEOUS) ×2 IMPLANT
DRAPE IMP U-DRAPE 54X76 (DRAPES) ×4 IMPLANT
DRAPE INCISE IOBAN 66X45 STRL (DRAPES) ×4 IMPLANT
DRAPE SHEET LG 3/4 BI-LAMINATE (DRAPES) ×4 IMPLANT
DRAPE TABLE BACK 80X90 (DRAPES) ×2 IMPLANT
DRSG OPSITE POSTOP 4X8 (GAUZE/BANDAGES/DRESSINGS) ×2 IMPLANT
ELECT BLADE 6.5 EXT (BLADE) IMPLANT
ELECT CAUTERY BLADE 6.4 (BLADE) ×2 IMPLANT
GLENOID SPHERE STD STRL 36MM (Orthopedic Implant) ×2 IMPLANT
GLOVE BIO SURGEON STRL SZ7.5 (GLOVE) ×8 IMPLANT
GLOVE BIO SURGEON STRL SZ8 (GLOVE) ×8 IMPLANT
GLOVE BIOGEL PI IND STRL 8 (GLOVE) ×1 IMPLANT
GLOVE BIOGEL PI INDICATOR 8 (GLOVE) ×1
GLOVE INDICATOR 8.0 STRL GRN (GLOVE) ×2 IMPLANT
GOWN STRL REUS W/ TWL LRG LVL3 (GOWN DISPOSABLE) ×1 IMPLANT
GOWN STRL REUS W/ TWL XL LVL3 (GOWN DISPOSABLE) ×1 IMPLANT
GOWN STRL REUS W/TWL LRG LVL3 (GOWN DISPOSABLE) ×1
GOWN STRL REUS W/TWL XL LVL3 (GOWN DISPOSABLE) ×1
HOOD PEEL AWAY FLYTE STAYCOOL (MISCELLANEOUS) ×6 IMPLANT
KIT STABILIZATION SHOULDER (MISCELLANEOUS) ×2 IMPLANT
KIT TURNOVER KIT A (KITS) ×2 IMPLANT
MASK FACE SPIDER DISP (MASK) ×2 IMPLANT
MAT ABSORB  FLUID 56X50 GRAY (MISCELLANEOUS) ×1
MAT ABSORB FLUID 56X50 GRAY (MISCELLANEOUS) ×1 IMPLANT
NDL SAFETY ECLIPSE 18X1.5 (NEEDLE) ×1 IMPLANT
NEEDLE HYPO 18GX1.5 SHARP (NEEDLE) ×1
NEEDLE HYPO 22GX1.5 SAFETY (NEEDLE) ×2 IMPLANT
NEEDLE SPNL 20GX3.5 QUINCKE YW (NEEDLE) ×2 IMPLANT
NS IRRIG 500ML POUR BTL (IV SOLUTION) ×2 IMPLANT
PACK ARTHROSCOPY SHOULDER (MISCELLANEOUS) ×2 IMPLANT
PAD WRAPON POLAR SHDR UNIV (MISCELLANEOUS) ×1 IMPLANT
PIN HUMERAL STMN 3.2MMX9IN (INSTRUMENTS) ×2 IMPLANT
PULSAVAC PLUS IRRIG FAN TIP (DISPOSABLE) ×2
SCREW BONE LOCKING 4.75X30X3.5 (Screw) ×4 IMPLANT
SCREW BONE STRL 6.5MMX30MM (Screw) ×2 IMPLANT
SCREW NON-LOCK 4.75MMX15MM (Screw) ×4 IMPLANT
SHOULDER HUMERAL BEAR 36M STD (Shoulder) ×2 IMPLANT
SLING ULTRA II M (MISCELLANEOUS) ×2 IMPLANT
SOL .9 NS 3000ML IRR  AL (IV SOLUTION) ×1
SOL .9 NS 3000ML IRR UROMATIC (IV SOLUTION) ×1 IMPLANT
SPONGE LAP 18X18 RF (DISPOSABLE) ×2 IMPLANT
STAPLER SKIN PROX 35W (STAPLE) ×2 IMPLANT
STEM HUMERAL STRL 13MMX55MM (Stem) ×2 IMPLANT
SUT ETHIBOND 0 MO6 C/R (SUTURE) ×2 IMPLANT
SUT FIBERWIRE #2 38 BLUE 1/2 (SUTURE) ×8
SUT VIC AB 0 CT1 36 (SUTURE) ×2 IMPLANT
SUT VIC AB 2-0 CT1 27 (SUTURE) ×2
SUT VIC AB 2-0 CT1 TAPERPNT 27 (SUTURE) ×2 IMPLANT
SUTURE FIBERWR #2 38 BLUE 1/2 (SUTURE) ×4 IMPLANT
SYR 10ML LL (SYRINGE) ×2 IMPLANT
SYR 30ML LL (SYRINGE) IMPLANT
TIP FAN IRRIG PULSAVAC PLUS (DISPOSABLE) ×1 IMPLANT
TRAY FOLEY MTR SLVR 16FR STAT (SET/KITS/TRAYS/PACK) IMPLANT
TRAY HUM MINI +0 40D +5 (Shoulder) ×2 IMPLANT
TUBING CONNECTING 10 (TUBING) ×2 IMPLANT
WRAPON POLAR PAD SHDR UNIV (MISCELLANEOUS) ×2

## 2018-08-25 NOTE — H&P (Signed)
Paper H&P to be scanned into permanent record. H&P reviewed and patient re-examined. No changes. 

## 2018-08-25 NOTE — Anesthesia Procedure Notes (Signed)
Anesthesia Regional Block: Interscalene brachial plexus block   Pre-Anesthetic Checklist: ,, timeout performed, Correct Patient, Correct Site, Correct Laterality, Correct Procedure, Correct Position, site marked, Risks and benefits discussed,  Surgical consent,  Pre-op evaluation,  At surgeon's request and post-op pain management  Laterality: Right  Prep: chloraprep       Needles:  Injection technique: Single-shot  Needle Type: Stimiplex     Needle Length: 10cm  Needle Gauge: 21     Additional Needles:   Procedures:,,,, ultrasound used (permanent image in chart),,,,  Narrative:  Start time: 08/25/2018 7:55 AM End time: 08/25/2018 7:59 AM Injection made incrementally with aspirations every 5 mL.  Performed by: Personally  Anesthesiologist: Emmie Niemann, MD  Additional Notes: Functioning IV was confirmed and monitors were applied.  A Stimuplex needle was used. Sterile prep and drape,hand hygiene and sterile gloves were used.  Negative aspiration and negative test dose prior to incremental administration of local anesthetic. The patient tolerated the procedure well.

## 2018-08-25 NOTE — Anesthesia Postprocedure Evaluation (Signed)
Anesthesia Post Note  Patient: Erika Crawford  Procedure(s) Performed: REVERSE SHOULDER ARTHROPLASTY, RIGHT (Right Shoulder)  Patient location during evaluation: PACU Anesthesia Type: General Level of consciousness: awake and alert and oriented Pain management: pain level controlled Vital Signs Assessment: post-procedure vital signs reviewed and stable Respiratory status: spontaneous breathing, nonlabored ventilation and respiratory function stable Cardiovascular status: blood pressure returned to baseline and stable Postop Assessment: no signs of nausea or vomiting Anesthetic complications: no     Last Vitals:  Vitals:   08/25/18 1335 08/25/18 1429  BP: (!) 160/84 (!) 146/84  Pulse: 87 88  Resp:    Temp: 36.4 C (!) 36.4 C  SpO2: 93% 96%    Last Pain:  Vitals:   08/25/18 1429  TempSrc: Oral  PainSc:                  Edwardo Wojnarowski

## 2018-08-25 NOTE — Transfer of Care (Signed)
Immediate Anesthesia Transfer of Care Note  Patient: Erika Crawford  Procedure(s) Performed: REVERSE SHOULDER ARTHROPLASTY, RIGHT (Right Shoulder)  Patient Location: PACU  Anesthesia Type:General  Level of Consciousness: awake and patient cooperative  Airway & Oxygen Therapy: Patient Spontanous Breathing and Patient connected to nasal cannula oxygen  Post-op Assessment: Report given to RN  Post vital signs: Reviewed and stable  Last Vitals:  Vitals Value Taken Time  BP 140/76 08/25/2018 11:44 AM  Temp    Pulse 92 08/25/2018 11:45 AM  Resp    SpO2 100 % 08/25/2018 11:45 AM  Vitals shown include unvalidated device data.  Last Pain:  Vitals:   08/25/18 0715  TempSrc: Tympanic  PainSc: 3          Complications: No apparent anesthesia complications

## 2018-08-25 NOTE — Progress Notes (Signed)
Dr. Roland Rack here on floor assessing patient and gave me verbal orders for more antiemetic meds.

## 2018-08-25 NOTE — Anesthesia Procedure Notes (Signed)
Procedure Name: Intubation Date/Time: 08/25/2018 9:02 AM Performed by: Bernardo Heater, CRNA Pre-anesthesia Checklist: Patient identified, Emergency Drugs available, Suction available and Patient being monitored Patient Re-evaluated:Patient Re-evaluated prior to induction Oxygen Delivery Method: Circle system utilized Preoxygenation: Pre-oxygenation with 100% oxygen Induction Type: IV induction Ventilation: Mask ventilation without difficulty Laryngoscope Size: Mac and 3 Grade View: Grade I Tube type: Oral Tube size: 7.0 mm Number of attempts: 1 Placement Confirmation: ETT inserted through vocal cords under direct vision,  positive ETCO2 and breath sounds checked- equal and bilateral Secured at: 21 cm Tube secured with: Tape Dental Injury: Teeth and Oropharynx as per pre-operative assessment

## 2018-08-25 NOTE — Op Note (Signed)
08/25/2018  12:13 PM  Patient:   Erika Crawford  Pre-Op Diagnosis:   Cuff arthropathy, right shoulder.  Post-Op Diagnosis:   Same  Procedure:   Reverse right total shoulder arthroplasty.  Surgeon:   Pascal Lux, MD  Assistant:   Desiree Hane, RNFA-S  Anesthesia:   General endotracheal with an interscalene block using Exparel placed preoperatively by the anesthesiologist.  Findings:   As above.  Complications:   None  EBL:    250 cc  Fluids:   1200 cc crystalloid  UOP:   None  TT:   None  Drains:   None  Closure:   Staples  Implants:   All press-fit Biomet Comprehensive system with a #13 micro-humeral stem, a +5 mm 40 mm humeral tray with a standard insert, and a mini-base plate with a 36 mm glenosphere.  Brief Clinical Note:   The patient is a 78 year old female with a long history of right shoulder pain. Her symptoms have progressed despite medications, activity modification, injections, etc. Her history and examination are consistent with degenerative joint disease with a rotator cuff tear, all of which were consistent with cuff arthropathy. These findings were confirmed by MRI scan. The patient presents at this time for a reverse right total shoulder arthroplasty.  Procedure:   The patient underwent placement of an interscalene block using Exparel by the anesthesiologist in the preoperative holding area before being brought into the operating room and lain in the supine position. The patient then underwent general endotracheal intubation and anesthesia before the patient was repositioned in the beach chair position using the beach chair positioner. The right shoulder and upper extremity were prepped with ChloraPrep solution before being draped sterilely. Preoperative antibiotics were administered. A standard anterior approach to the shoulder was made through an approximately 4-5 inch incision. The incision was carried down through the subcutaneous tissues to expose  the deltopectoral fascia. The interval between the deltoid and pectoralis muscles was identified and this plane developed, retracting the cephalic vein laterally with the deltoid muscle. The conjoined tendon was identified. Its lateral margin was dissected and the Kolbel self-retraining retractor inserted. The "three sisters" were identified and cauterized. Bursal tissues were removed to improve visualization. The subscapularis tendon was released from its attachment to the lesser tuberosity 1 cm proximal to its insertion and several tagging sutures placed. The inferior capsule was released with care after identifying and protecting the axillary nerve. The proximal humeral cut was made at approximately 25 of retroversion using the extra-medullary guide.   Attention was redirected to the glenoid. The labrum was debrided circumferentially before the center of the glenoid was marked with electrocautery. The guidewire was drilled into the glenoid neck using the appropriate guide. After verifying its position, it was overreamed with the mini-baseplate reamer to create a flat surface. The permanent mini-baseplate was impacted into place. It was stabilized with a 30 x 6.5 mm central screw and four peripheral screws. Locking screws were placed superiorly and inferiorly while nonlocking screws were placed anteriorly and posteriorly. The permanent 36 mm glenosphere was then impacted into place and its Morse taper locking mechanism verified using manual distraction.  Attention was directed to the humeral side. The humeral canal was reamed sequentially beginning with the end-cutting reamer then progressing from a 4 mm reamer up to a 13 mm reamer. This provided excellent circumferential chatter. The canal was broached beginning with a #9 broach and progressing to a #13 broach. This was left in place and a trial  reduction performed using the +0 mm and +5 mm standard trial humeral platforms along with the +0 mm and +3 mm  humeral inserts.  With the +5 mm standard trial platform and the +0 mm insert, the arm demonstrated excellent range of motion as the hand could be brought across the chest to the opposite shoulder and brought to the top of the patient's head and to the patient's ear. The shoulder appeared stable throughout this range of motion. The joint was dislocated and the trial components removed. The permanent #13 micro-stem was impacted into place with care taken to maintain the appropriate version. The permanent +5 mm 40 mm humeral platform with the standard insert was put together on the back table and impacted into place. Again, the [facility] taper locking mechanism was verified using manual distraction. The shoulder was relocated using two finger pressure and again placed through a range of motion with the findings as described above.  The wound was copiously irrigated with bacitracin saline solution using the jet lavage system before a total of 30 cc of 0.5% Sensorcaine with epinephrine was injected into the pericapsular and peri-incisional tissues to help with postoperative analgesia. The subscapularis tendon was reapproximated using #2 FiberWire interrupted sutures. The deltopectoral interval was closed using #0 Vicryl interrupted sutures before the subcutaneous tissues were closed using 2-0 Vicryl interrupted sutures. The skin was closed using staples. Prior to closing the skin, 1 g of transexemic acid in 10 cc of normal saline was injected intra-articularly to help with postoperative bleeding. A sterile occlusive dressing was applied to the wound before the arm was placed into a shoulder immobilizer with an abduction pillow. A Polar Care system also was applied to the shoulder. The patient was then transferred back to a hospital bed before being awakened, extubated, and returned to the recovery room in satisfactory condition after tolerating the procedure well.

## 2018-08-25 NOTE — Progress Notes (Signed)
PT Cancellation Note  Patient Details Name: Erika Crawford MRN: [id number] DOB: [birth year]   Cancelled Treatment:    Reason Eval/Treat Not Completed: Patient declined PT x 2 this PM secondary to generally not feeling well, nursing notified. Will attempt to see pt tomorrow as appropriate.    Linus Salmons PT, DPT 08/25/18, 3:41 PM

## 2018-08-25 NOTE — Anesthesia Post-op Follow-up Note (Signed)
Anesthesia QCDR form completed.        

## 2018-08-25 NOTE — Progress Notes (Signed)
15 minute call to floor. 

## 2018-08-25 NOTE — Anesthesia Preprocedure Evaluation (Signed)
Anesthesia Evaluation  Patient identified by MRN, date of birth, ID band Patient awake    Reviewed: Allergy & Precautions, NPO status , Patient's Chart, lab work & pertinent test results  History of Anesthesia Complications Negative for: history of anesthetic complications  Airway Mallampati: II  TM Distance: >3 FB Neck ROM: Full    Dental  (+) Partial Upper   Pulmonary sleep apnea and Continuous Positive Airway Pressure Ventilation , neg COPD, former smoker,    breath sounds clear to auscultation- rhonchi (-) wheezing      Cardiovascular hypertension, Pt. on medications (-) CAD, (-) Past MI, (-) Cardiac Stents and (-) CABG  Rhythm:Regular Rate:Normal - Systolic murmurs and - Diastolic murmurs    Neuro/Psych neg Seizures negative psych ROS   GI/Hepatic negative GI ROS, Neg liver ROS,   Endo/Other  neg diabetesHypothyroidism   Renal/GU Renal disease     Musculoskeletal  (+) Arthritis ,   Abdominal (+) + obese,   Peds  Hematology  (+) anemia ,   Anesthesia Other Findings Past Medical History: No date: Arthritis No date: Complication of anesthesia     Comment:  dizziness No date: Hypertension No date: Hypothyroidism No date: Sleep apnea     Comment:  cpap No date: Thyroid disease   Reproductive/Obstetrics                             Anesthesia Physical Anesthesia Plan  ASA: II  Anesthesia Plan: General   Post-op Pain Management:  Regional for Post-op pain   Induction: Intravenous  PONV Risk Score and Plan: 2 and Ondansetron and Dexamethasone  Airway Management Planned: Oral ETT  Additional Equipment:   Intra-op Plan:   Post-operative Plan: Extubation in OR  Informed Consent: I have reviewed the patients History and Physical, chart, labs and discussed the procedure including the risks, benefits and alternatives for the proposed anesthesia with the patient or authorized  representative who has indicated his/her understanding and acceptance.     Dental advisory given  Plan Discussed with: CRNA and Anesthesiologist  Anesthesia Plan Comments:         Anesthesia Quick Evaluation

## 2018-08-25 NOTE — TOC Initial Note (Signed)
Transition of Care [facility]) - Initial/Assessment Note    Patient Details  Name: Erika Crawford MRN: [id number] Date of Birth: 12/25/1940  [facility] CM/SW Contact:    Su Hilt, RN Phone Number: 08/25/2018, 2:31 PM  Clinical Narrative:                 Patient would like to go to Rehab if possible  Expected Discharge Plan: [place] Barriers to Discharge: SNF Pending bed offer   Patient Goals and CMS Choice Patient states their goals for this hospitalization and ongoing recovery are:: go to rehab CMS Medicare.gov Compare Post Acute Care list provided to:: Patient Choice offered to / list presented to : Patient  Expected Discharge Plan and Services Expected Discharge Plan: [place] Discharge Planning Services: CM Consult Post Acute Care Choice: [place] arrangements for the past 2 months: Apartment                          Prior Living Arrangements/Services Living arrangements for the past 2 months: Apartment Lives with:: Self Patient language and need for interpreter reviewed:: No Do you feel safe going back to the place where you live?: Yes      Need for Family Participation in Patient Care: No (Comment) Care giver support system in place?: No (comment) Current home services: (independent) Criminal Activity/Legal Involvement Pertinent to Current Situation/Hospitalization: No - Comment as needed  Activities of Daily Living      Permission Sought/Granted Permission sought to share information with : Facility Art therapist granted to share information with : Yes, Verbal Permission Granted     Permission granted to share info w AGENCY: any SNF facility        Emotional Assessment Appearance:: Appears stated age Attitude/Demeanor/Rapport: Gracious, Engaged Affect (typically observed): Accepting Orientation: : Oriented to Self, Oriented to Place, Oriented to  Time, Oriented to  Situation Alcohol / Substance Use: Never Used Psych Involvement: No (comment)  Admission diagnosis:  ROTATOR CUFF TENDINITIS, RIGHT Patient Active Problem List   Diagnosis Date Noted  . 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 Chronic left-sided low back pain with left-sided sciatica 06/22/2018  . Vitamin B 12 deficiency 01/02/2018  . Chronic anemia 09/06/2017  . Chronic constipation 06/08/2017  . Lumbar radiculopathy 05/22/2017  . Spinal stenosis of lumbosacral region 03/19/2017  . 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 Acute bilateral low back pain without sciatica 02/04/2017  . Dysuria 12/13/2016  . Prediabetes 11/05/2016  . Acquired hypothyroidism 08/24/2016  . Myofascial pain syndrome 08/24/2016  . Essential hypertension 08/24/2016  . Osteopenia of multiple sites 08/24/2016  . Lumbar degenerative disc disease 08/24/2016  . Bilateral hydronephrosis 07/15/2016  . Rotator cuff tendinitis, left 06/21/2016  . 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 Rotator cuff tendinitis, right 11/27/2015  . 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  . Injury of tendon of long head of right biceps 11/27/2015  . Incomplete tear of right rotator cuff 11/27/2015  . Obstructive sleep apnea on CPAP 07/13/2015  . Osteoarthritis 11/04/2013  . Disorder of uterus 04/29/1995   PCP:  Glendon Axe, MD Pharmacy:   [facility] [place], [place] AT [facility] OF [address] & [place] [address] Phone: [phone] Fax: [phone]     Social Determinants of Health (  SDOH) Interventions    Readmission Risk Interventions 30 Day Unplanned Readmission Risk Score     Admission (Current) from 08/25/2018 in [place] (1A)  30 Day Unplanned Readmission Risk Score (%)  5 Filed at 08/25/2018 1200     This score is the patient's risk  of an unplanned readmission within 30 days of being discharged (0 -100%). The score is based on dignosis, age, lab data, medications, orders, and past utilization.   Low:  0-14.9   Medium: 15-21.9   High: 22-29.9   Extreme: 30 and above       No flowsheet data found.

## 2018-08-26 LAB — CBC WITH DIFFERENTIAL/PLATELET
Abs Immature Granulocytes: 0.04 10*3/uL (ref 0.00–0.07)
Basophils Absolute: 0 10*3/uL (ref 0.0–0.1)
Basophils Relative: 0 %
EOS ABS: 0 10*3/uL (ref 0.0–0.5)
Eosinophils Relative: 0 %
HCT: 34.5 % — ABNORMAL LOW (ref 36.0–46.0)
Hemoglobin: 11.3 g/dL — ABNORMAL LOW (ref 12.0–15.0)
Immature Granulocytes: 0 %
Lymphocytes Relative: 14 %
Lymphs Abs: 1.5 10*3/uL (ref 0.7–4.0)
MCH: 32.3 pg (ref 26.0–34.0)
MCHC: 32.8 g/dL (ref 30.0–36.0)
MCV: 98.6 fL (ref 80.0–100.0)
Monocytes Absolute: 0.8 10*3/uL (ref 0.1–1.0)
Monocytes Relative: 8 %
Neutro Abs: 8.3 10*3/uL — ABNORMAL HIGH (ref 1.7–7.7)
Neutrophils Relative %: 78 %
PLATELETS: 165 10*3/uL (ref 150–400)
RBC: 3.5 MIL/uL — ABNORMAL LOW (ref 3.87–5.11)
RDW: 14.4 % (ref 11.5–15.5)
WBC: 10.7 10*3/uL — AB (ref 4.0–10.5)
nRBC: 0 % (ref 0.0–0.2)

## 2018-08-26 LAB — BASIC METABOLIC PANEL
Anion gap: 8 (ref 5–15)
BUN: 9 mg/dL (ref 8–23)
CO2: 24 mmol/L (ref 22–32)
Calcium: 8.3 mg/dL — ABNORMAL LOW (ref 8.9–10.3)
Chloride: 110 mmol/L (ref 98–111)
Creatinine, Ser: 0.5 mg/dL (ref 0.44–1.00)
GFR calc non Af Amer: >60 mL/min (ref >60)
Glucose, Bld: 127 mg/dL — ABNORMAL HIGH (ref 70–99)
Potassium: 3.3 mmol/L — ABNORMAL LOW (ref 3.5–5.1)
Sodium: 142 mmol/L (ref 135–145)

## 2018-08-26 MED ORDER — POTASSIUM CHLORIDE CRYS ER 20 MEQ PO TBCR
20.0000 meq | EXTENDED_RELEASE_TABLET | ORAL | Status: AC
  Administered 2018-08-26 (×2): 20 meq via ORAL
  Filled 2018-08-26: qty 1

## 2018-08-26 MED ORDER — ASPIRIN EC 325 MG PO TBEC: 325.0000 mg | DELAYED_RELEASE_TABLET | Freq: Every day | ORAL | 0 refills | Status: AC

## 2018-08-26 MED ORDER — HYDROCODONE-ACETAMINOPHEN 5-325 MG PO TABS: 1.0000 | ORAL_TABLET | ORAL | 0 refills | Status: DC | PRN

## 2018-08-26 NOTE — Clinical Social Work Note (Signed)
CSW received referral for SNF.  Case discussed with case manager and plan is to discharge home with home health.  CSW to sign off please re-consult if social work needs arise.  Demyan Fugate R. Katrisha Segall, MSW, LCSW [phone]  

## 2018-08-26 NOTE — Care Management (Signed)
Called Sharmon Revere with [facility] and she agreed to take the patient for PT and OT

## 2018-08-26 NOTE — Progress Notes (Addendum)
   Subjective: 1 Day Post-Op Procedure(s) (LRB): REVERSE SHOULDER ARTHROPLASTY, RIGHT (Right) Patient reports pain as mild.   Patient is well, and has had no acute complaints or problems Denies any CP, SOB, ABD pain. Nausea much improved We will start therapy today.  Plan is to go Home after hospital stay.  Objective: Vital signs in last 24 hours: Temp:  [97.5 F (36.4 C)-98.7 F (37.1 C)] 98.2 F (36.8 C) (03/11 0732) Pulse Rate:  [70-95] 71 (03/11 0732) Resp:  [12-18] 18 (03/11 0732) BP: (132-160)/(67-90) 135/67 (03/11 0732) SpO2:  [93 %-100 %] 97 % (03/11 0732)  Intake/Output from previous day: 03/10 0701 - 03/11 0700 In: 1364.2 [I.V.:1264.2; IV Piggyback:100] Out: 200 [Blood:200] Intake/Output this shift: No intake/output data recorded.  Recent Labs    08/24/18 0935 08/26/18 0342  HGB 12.9 11.3*   Recent Labs    08/24/18 0935 08/26/18 0342  WBC 6.5 10.7*  RBC 3.99 3.50*  HCT 38.7 34.5*  PLT 192 165   Recent Labs    08/24/18 0935 08/26/18 0342  NA 141 142  K 3.5 3.3*  CL 107 110  CO2 25 24  BUN 15 9  CREATININE 0.58 0.50  GLUCOSE 103* 127*  CALCIUM 9.1 8.3*   No results for input(s): LABPT, INR in the last 72 hours.  EXAM General - Patient is Alert, Appropriate and Oriented Extremity - Neurovascular intact Sensation intact distally Intact pulses distally Dorsiflexion/Plantar flexion intact No cellulitis present Compartment soft Dressing - dressing C/D/I and no drainage Motor Function - intact, moving wrist and digits well on exam.   Past Medical History:  Diagnosis Date  . Arthritis   . Complication of anesthesia    dizziness  . Hypertension   . Hypothyroidism   . Sleep apnea    cpap  . Thyroid disease     Assessment/Plan:   1 Day Post-Op Procedure(s) (LRB): REVERSE SHOULDER ARTHROPLASTY, RIGHT (Right) Active Problems:   Status post reverse total shoulder replacement, right   hypokalemia  Estimated body mass index is 31.83  kg/m as calculated from the following:   Height as of this encounter: 5\' 7"  (1.702 m).   Weight as of this encounter: 92.2 kg. Advance diet Up with therapy  hypokalemia- K 3.3, will supplement today Plan on discharge to home today pending good progress with PT DVT Prophylaxis - Lovenox    T. Rachelle Hora, PA-C [place] 08/26/2018, 7:55 AM

## 2018-08-26 NOTE — Care Management (Signed)
Notified Brad with [facility] that patient needs a 3 in 1

## 2018-08-26 NOTE — Discharge Summary (Signed)
Physician Discharge Summary  Patient ID: ARTHUR AYDELOTTE MRN: [id number] DOB/AGE: September 25, 1940 78 y.o.  Admit date: 08/25/2018 Discharge date: 08/26/2018  Admission Diagnoses:  ROTATOR CUFF TENDINITIS, RIGHT   Discharge Diagnoses: Patient Active Problem List   Diagnosis Date Noted  . 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 Chronic left-sided low back pain with left-sided sciatica 06/22/2018  . Vitamin B 12 deficiency 01/02/2018  . Chronic anemia 09/06/2017  . Chronic constipation 06/08/2017  . Lumbar radiculopathy 05/22/2017  . Spinal stenosis of lumbosacral region 03/19/2017  . 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 Acute bilateral low back pain without sciatica 02/04/2017  . Dysuria 12/13/2016  . Prediabetes 11/05/2016  . Acquired hypothyroidism 08/24/2016  . Myofascial pain syndrome 08/24/2016  . Essential hypertension 08/24/2016  . Osteopenia of multiple sites 08/24/2016  . Lumbar degenerative disc disease 08/24/2016  . Bilateral hydronephrosis 07/15/2016  . Rotator cuff tendinitis, left 06/21/2016  . 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 Rotator cuff tendinitis, right 11/27/2015  . 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  . Injury of tendon of long head of right biceps 11/27/2015  . Incomplete tear of right rotator cuff 11/27/2015  . Obstructive sleep apnea on CPAP 07/13/2015  . Osteoarthritis 11/04/2013  . Disorder of uterus 04/29/1995    Past Medical History:  Diagnosis Date  . Arthritis   . Complication of anesthesia    dizziness  . Hypertension   . Hypothyroidism   . Sleep apnea    cpap  . Thyroid disease      Transfusion: none   Consultants (if any):   Discharged Condition: Improved  Hospital Course: RICKY DOAN is an 78 y.o. female who was admitted 08/25/2018 with a diagnosis of glenohumeral osteoarthritis and went to the operating room on 08/25/2018  and underwent the above named procedures.    Surgeries: Procedure(s): REVERSE SHOULDER ARTHROPLASTY, RIGHT on 08/25/2018 Patient tolerated the surgery well. Taken to PACU where she was stabilized and then transferred to the orthopedic floor.  Started on Lovenox 40 mg q 24 hrs.Physical therapy started on day #1 for gait training. OT started day #1 for ADL and assisted devices.  On post op day #1 patient was stable and ready for discharge to home.  Implants: All press-fit Bi[employer] Comprehensive system with a #13 micro-humeral stem, a +5 mm 40 mm humeral tray with a standard insert, and a mini-base plate with a 36 mm glenosphere  She was given perioperative antibiotics:  Anti-infectives (From admission, onward)   Start     Dose/Rate Route Frequency Ordered Stop   08/25/18 1300  ceFAZolin (ANCEF) IVPB 2g/100 mL premix     2 g 200 mL/hr over 30 Minutes Intravenous Every 6 hours 08/25/18 1259 08/26/18 0229   08/25/18 0646  ceFAZolin (ANCEF) 2-4 GM/100ML-% IVPB    Note to Pharmacy:  Lyman Bishop   : cabinet override      08/25/18 0646 08/25/18 0917   08/24/18 2315  ceFAZolin (ANCEF) IVPB 2g/100 mL premix     2 g 200 mL/hr over 30 Minutes Intravenous  Once 08/24/18 2306 08/25/18 0932    .  She was given aspirin for DVT prophylaxis.  She benefited maximally from the hospital stay and there were no complications.    Recent vital signs:  Vitals:   08/26/18 0732 08/26/18 1130  BP: 135/67 (!) 122/54  Pulse: 71 86  Resp: 18 18  Temp: 98.2 F (36.8  C) 98 F (36.7 C)  SpO2: 97% 97%    Recent laboratory studies:  Lab Results  Component Value Date   HGB 11.3 (L) 08/26/2018   HGB 12.9 08/24/2018   HGB 12.3 04/15/2016   Lab Results  Component Value Date   WBC 10.7 (H) 08/26/2018   PLT 165 08/26/2018   Lab Results  Component Value Date   INR 1.06 04/15/2016   Lab Results  Component Value Date   NA 142 08/26/2018   K 3.3 (L) 08/26/2018   CL 110 08/26/2018   CO2 24  08/26/2018   BUN 9 08/26/2018   CREATININE 0.50 08/26/2018   GLUCOSE 127 (H) 08/26/2018    Discharge Medications:   Allergies as of 08/26/2018      Reactions   Celebrex [celecoxib] Rash   Codeine Nausea And Vomiting   Etodolac Nausea And Vomiting   Hydrochlorothiazide    Unknown reaction   Oxycodone Nausea And Vomiting   Pravastatin    Unknown reaction   Relafen [nabumetone]    Unknown reaction    Tramadol Nausea And Vomiting      Medication List    STOP taking these medications   acetaminophen 500 MG tablet Commonly known as:  TYLENOL   naproxen sodium 220 MG tablet Commonly known as:  ALEVE     TAKE these medications   alendronate 70 MG tablet Commonly known as:  FOSAMAX Take 70 mg by mouth every Wednesday. Take with a full glass of water on an empty stomach.   amLODipine 5 MG tablet Commonly known as:  NORVASC Take 5 mg by mouth daily.   ASPERCREME LIDOCAINE EX Apply 1 application topically daily as needed (pain).   aspirin EC 325 MG tablet Take 1 tablet (325 mg total) by mouth daily for 14 days.   BIOFREEZE EX Apply 1 application topically daily as needed (pain).   TWO OLD GOATS ARTHRITIS EX Apply 1 application topically daily as needed (pain).   cholecalciferol 25 MCG (1000 UT) tablet Commonly known as:  VITAMIN D3 Take 2,000 Units by mouth daily.   gabapentin 300 MG capsule Commonly known as:  Neurontin 300 mg qday, 300 mg qPM, 600 mg qhs   HYDROcodone-acetaminophen 5-325 MG tablet Commonly known as:  NORCO/VICODIN Take 1-2 tablets by mouth every 4 (four) hours as needed for moderate pain (pain score 4-6). What changed:    how much to take  when to take this  reasons to take this   levothyroxine 88 MCG tablet Commonly known as:  SYNTHROID, LEVOTHROID Take 88 mcg by mouth daily before breakfast.   mometasone 50 MCG/ACT nasal spray Commonly known as:  NASONEX Place 2 sprays into the nose daily as needed (allergies).   olmesartan 40 MG  tablet Commonly known as:  BENICAR Take 40 mg by mouth daily.   omeprazole 20 MG capsule Commonly known as:  PRILOSEC Take 20 mg by mouth daily.   OPCON-A OP Place 1 drop into both eyes daily as needed (allergies).   THERAWORX RELIEF EX Apply 1 application topically daily as needed (pain).       Diagnostic Studies: Ct Shoulder Right Wo Contrast  Result Date: 08/06/2018 CLINICAL DATA:  Chronic right shoulder pain. Preoperative planning examination. EXAM: CT OF THE UPPER RIGHT EXTREMITY WITHOUT CONTRAST TECHNIQUE: Multidetector CT imaging of the upper right extremity was performed according to the standard protocol. COMPARISON:  MRI right shoulder 01/01/2017. FINDINGS: Bones/Joint/Cartilage As seen on the comparison study, the patient has advanced glenohumeral osteoarthritis  with joint space narrowing, subchondral sclerosis and a large osteophyte off the inferior, medial margin of the humeral head. No acute bony or joint abnormality is seen. Moderate to moderately severe acromioclavicular osteoarthritis is also noted. The acromion is type 2 with subacromial spurring. No acute bony abnormality is identified. No lytic or sclerotic lesion is seen. Ligaments Suboptimally assessed by CT. Muscles and Tendons The rotator cuff appears intact. Musculature of the shoulder girdle is preserved. Soft tissues Imaged lung parenchyma is clear. IMPRESSION: Advanced glenohumeral osteoarthritis. Moderate to moderately severe acromioclavicular osteoarthritis. Subacromial spurring noted. Electronically Signed   By: Inge Rise M.D.   On: 08/06/2018 13:00   Dg Shoulder Right Port  Result Date: 08/25/2018 CLINICAL DATA:  78 year old female for right shoulder replacement. Subsequent encounter. EXAM: PORTABLE RIGHT SHOULDER COMPARISON:  08/06/2018 CT. FINDINGS: Post reversed total right shoulder replacement. Question fracture along proximal humeral shaft (medial aspect). Acromioclavicular joint degenerative  changes. IMPRESSION: Post reversed total right shoulder replacement. Question fracture along proximal humeral shaft (medial aspect). These results will be called to the ordering clinician or representative by the Radiologist Assistant, and communication documented in the PACS or zVision Dashboard. Electronically Signed   By: Genia Del M.D.   On: 08/25/2018 12:05   Dg C-arm 1-60 Min-no Report  Result Date: 08/12/2018 Fluoroscopy was utilized by the requesting physician.  No radiographic interpretation.   Mm 3d Screen Breast Bilateral  Result Date: 08/11/2018 CLINICAL DATA:  Screening. EXAM: DIGITAL SCREENING BILATERAL MAMMOGRAM WITH TOMO AND CAD COMPARISON:  Previous exam(s). ACR Breast Density Category b: There are scattered areas of fibroglandular density. FINDINGS: There are no findings suspicious for malignancy. Images were processed with CAD. IMPRESSION: No mammographic evidence of malignancy. A result letter of this screening mammogram will be mailed directly to the patient. RECOMMENDATION: Screening mammogram in one year. (Code:[id number]-B-01Y) BI-RADS CATEGORY  1: Negative. Electronically Signed   By: Claudie Revering M.D.   On: 08/11/2018 07:40    Disposition:     Follow-up Information    Lattie Corns, PA-C Follow up in 2 week(s).   Specialty:  Physician Assistant Contact information: [place] [address] [phone]            Signed: Feliberto Gottron 08/26/2018, 12:03 PM

## 2018-08-26 NOTE — TOC Transition Note (Signed)
Transition of Care [facility]) - CM/SW Discharge Note   Patient Details  Name: Erika Crawford MRN: [id number] Date of Birth: 11-09-1940  [facility] CM/SW Contact:  Su Hilt, RN Phone Number: 08/26/2018, 2:18 PM   Clinical Narrative:         Barriers to Discharge: SNF Pending bed offer   Patient Goals and CMS Choice Patient states their goals for this hospitalization and ongoing recovery are:: go to rehab CMS Medicare.gov Compare Post Acute Care list provided to:: Patient Choice offered to / list presented to : Patient  Discharge Placement                       Discharge Plan and Services Discharge Planning Services: CM Consult Post Acute Care Choice: Home Health          DME Arranged: 3-N-1 DME Agency: [facility] HH Arranged: PT, OT HH Agency: [facility]   Social Determinants of Health (SDOH) Interventions     Readmission Risk Interventions No flowsheet data found.

## 2018-08-26 NOTE — Discharge Instructions (Signed)
Shoulder Joint Replacement, Care After This sheet gives you information about how to care for yourself after your procedure. Your health care provider may also give you more specific instructions. If you have problems or questions, contact your health care provider. What can I expect after the procedure? After your procedure, it is common to have:  A bruised and stiff shoulder.  A bruised and stiff arm.  Some pain. Follow these instructions at home: If you have a sling:   Wear the sling as told by your health care provider. Remove it only as told by your health care provider.  Loosen the slingif your fingers tingle, become numb, or turn cold and blue.  Keep the sling clean.  If the sling is not waterproof: ? Do not let it get wet. ? Cover it with a watertight covering when you take a bath or a shower. Bathing  Do not take baths, swim, or use a hot tub until your health care provider approves. Ask your health care provider if you can take showers. You may only be allowed to take sponge baths for bathing.  If your sling is not waterproof, cover it with a watertight covering when you take a bath or a shower.  Keep the bandage (dressing) dry until your health care provider says it can be removed. Managing pain, stiffness, and swelling  If directed, put ice on the affected area. ? If you have a removable sling, remove it as told by your health care provider. ? Put ice in a plastic bag. ? Place a towel between your skin and the bag. ? Leave the ice on for 20 minutes, 2-3 times a day.  Move your fingers often to avoid stiffness and to lessen swelling. Driving  Do not drive or use heavy machinery while taking prescription pain medicine.  Do not drive for 2-4 weeks after surgery or as told by your health care provider. Medicine  Take over-the-counter and prescription medicines only as told by your health care provider.  If you were prescribed an antibiotic medicine, use it as  told by your health care provider. Do not stop using the antibiotic even if you start to feel better. Incision care  Follow instructions from your health care provider about how to take care of your incision area. Make sure you: ? Wash your hands with soap and water before you change your bandage (dressing). If soap and water are not available, use hand sanitizer. ? Change your dressing as told by your health care provider. ? Leave staples, stitches (sutures), skin glue, or adhesive strips in place. These skin closures may need to stay in place for 2 weeks or longer. If adhesive strip edges start to loosen and curl up, you may trim the loose edges. Do not remove adhesive strips completely unless your health care provider tells you to do that.  If you have a tube to remove drainage, follow instructions from your health care provider about caring for it. Do not remove the drain tube or any dressings around the tube opening unless your health care provider approves.  Check your incision area every day for signs of infection. Check for: ? More redness, swelling, or pain. ? More fluid or blood. ? Warmth. ? Pus or a bad smell. Activity  Do not use your arm to push yourself up in bed or from a chair. This requires too much muscle.  Follow lifting restrictions as told: ? Do not lift anything that is heavier than a cup  of coffee for the first 6 weeks after surgery, or as told by your health care provider. ? Do not lift anything that is heavier than 10 lb (4.5 kg) for 6 months, or as told by your health care provider.  Do exercises, including physical therapy, as told by your health care provider.  Try not to overuse your shoulder. This includes repetitive pushing or pulling. Early overuse of the shoulder may result in later problems. (Overusing the shoulder is easy to do when your pain goes away for the first time.)  Avoid overstretching your arm for 6 weeks after surgery, or as told by your health  care provider.  Avoid sitting for a long time without moving. Get up and move around one or more times every few hours.  Ask for help with some activities. Your health care provider may be able to suggest a clinic or agency for this if you do not have home support.  Do not participate in contact sports. General instructions  Keep all follow-up visits as told by your health care provider. This is important.  Do not use any products that contain nicotine or tobacco, such as cigarettes and e-cigarettes. These can delay healing. If you need help quitting, ask your health care provider. Contact a health care provider if:  You develop a rash.  You have a fever.  You have more redness, swelling, or pain in the incision area.  You have more fluid or blood coming from your incision area.  You have more pain when moving your shoulder. Get help right away if:  Your incision area feels warm to the touch.  You have pus or a bad smell coming from your incision area.  The edges of the incision site break open after sutures have been removed.  You have chest pain or shortness of breath. Summary  It is common to have pain and stiffness in your shoulder and arm after the procedure. Put ice on the affected area and take pain medicine as told by your health care provider.  Do not use your arm to push yourself up in bed or from a chair.  Do exercises, including physical therapy, as told by your health care provider.  Check your incision area daily. Call your health care provider if you see signs of infection. This information is not intended to replace advice given to you by your health care provider. Make sure you discuss any questions you have with your health care provider. Document Released: 12/21/2004 Document Revised: 03/18/2016 Document Reviewed: 03/18/2016 Elsevier Interactive Patient Education  2019 [employer].

## 2018-08-26 NOTE — Care Management (Signed)
Called Clair Gulling Powers with [facility] and left a secure VM letting him know that the patient would like to continue services with [facility]

## 2018-08-26 NOTE — Care Management (Signed)
Called [facility] [phone], talked to intake and notified them that the patient would like to continue services with [facility], she put me on hold and called the [place] office,, they will not be able to see the patient.

## 2018-08-26 NOTE — Progress Notes (Signed)
Patient is alert and oriented and able to verbalize needs. No complaints of pain at this time. VSS. PIV removed. Printed AVS given to patient in discharge packet. All instructions and follow up care gone over with patient at this time. No concerns voiced. All belongings packed. Awaiting family to arrive to transport patient home.  Bethann Punches, RN

## 2018-08-26 NOTE — Evaluation (Signed)
Physical Therapy Evaluation Patient Details Name: Erika Crawford MRN: [id number] DOB: 04-21-1941 Today's Date: 08/26/2018   History of Present Illness  Pt is a 78 yo female diagnosed with cuff arthropathy, right shoulder, and is s/p elective reverse right total shoulder arthroplasty.  PMH includes HTN, arthritis, hypothyroidism, and sleep apnea.      Clinical Impression  Pt presents with deficits in strength, transfers, mobility, gait, balance, and activity tolerance but overall performed well during the session.  Pt was mod Ind with bed mobility tasks and SBA with transfers with good control and stability.  Pt was able to amb 60' with a combination of SPC and no AD with slow, cautious cadence but steady without LOB.  Pt will benefit from HHPT services upon discharge to safely address above deficits for decreased caregiver assistance and eventual return to PLOF.      Follow Up Recommendations Home health PT;Supervision for mobility/OOB    Equipment Recommendations  None recommended by PT    Recommendations for Other Services       Precautions / Restrictions Precautions Precautions: Shoulder Shoulder Interventions: Shoulder sling/immobilizer Restrictions Weight Bearing Restrictions: Yes RUE Weight Bearing: Non weight bearing      Mobility  Bed Mobility Overal bed mobility: Modified Independent             General bed mobility comments: Extra time and effort but no physical assistance required  Transfers Overall transfer level: Needs assistance Equipment used: None Transfers: Sit to/from Stand Sit to Stand: Supervision         General transfer comment: Good control and stability with transfers  Ambulation/Gait Ambulation/Gait assistance: Supervision Gait Distance (Feet): 60 Feet Assistive device: None;Straight cane Gait Pattern/deviations: Step-through pattern;Decreased step length - right;Decreased step length - left Gait velocity: decreased   General  Gait Details: Slow, cautious cadence with both a SPC and without an AD with no LOB  Stairs            Wheelchair Mobility    Modified Rankin (Stroke Patients Only)       Balance Overall balance assessment: Needs assistance   Sitting balance-Leahy Scale: Normal     Standing balance support: During functional activity;No upper extremity supported;Single extremity supported Standing balance-Leahy Scale: Good                               Pertinent Vitals/Pain Pain Assessment: No/denies pain    Home Living Family/patient expects to be discharged to:: Private residence Living Arrangements: Alone Available Help at Discharge: Personal care attendant;Other (Comment)(PCA 4 days/wk for 2.5 hrs/day ) Type of Home: Apartment(Senior living apt with life alert and call bells) Home Access: Level entry     Home Layout: One level Home Equipment: Clinical cytogeneticist - 2 wheels;Walker - 4 wheels;Cane - single point;Cane - quad;Grab bars - tub/shower      Prior Function Level of Independence: Needs assistance   Gait / Transfers Assistance Needed: Pt Ind with amb without an AD limited community distances, no fall history  ADL's / Homemaking Assistance Needed: Ind with ADLs; PCA assists with housework        Hand Dominance   Dominant Hand: Right    Extremity/Trunk Assessment   Upper Extremity Assessment Upper Extremity Assessment: Defer to OT evaluation;RUE deficits/detail RUE: Unable to fully assess due to immobilization    Lower Extremity Assessment Lower Extremity Assessment: Generalized weakness       Communication   Communication: No  difficulties  Cognition Arousal/Alertness: Awake/alert Behavior During Therapy: WFL for tasks assessed/performed Overall Cognitive Status: Within Functional Limits for tasks assessed                                        General Comments      Exercises Total Joint Exercises Ankle Circles/Pumps:  AROM;Both;10 reps Long Arc Quad: Strengthening;Both;10 reps;15 reps Knee Flexion: Strengthening;Both;10 reps;15 reps Marching in Standing: AROM;Both;10 reps;Seated;Standing Other Exercises Other Exercises: RUE positioning education in supine and sitting  Other Exercises: Bed mobility training with log roll technique for decreased caregiver assistance   Assessment/Plan    PT Assessment Patient needs continued PT services  PT Problem List Decreased strength;Decreased activity tolerance;Decreased balance;Decreased mobility;Decreased knowledge of use of DME;Decreased knowledge of precautions       PT Treatment Interventions DME instruction;Gait training;Functional mobility training;Therapeutic activities;Therapeutic exercise;Balance training;Patient/family education    PT Goals (Current goals can be found in the Care Plan section)  Acute Rehab PT Goals Patient Stated Goal: To get back home PT Goal Formulation: With patient Time For Goal Achievement: 09/08/18 Potential to Achieve Goals: Good    Frequency BID   Barriers to discharge        Co-evaluation               AM-PAC PT "6 Clicks" Mobility  Outcome Measure Help needed turning from your back to your side while in a flat bed without using bedrails?: A Little Help needed moving from lying on your back to sitting on the side of a flat bed without using bedrails?: A Little Help needed moving to and from a bed to a chair (including a wheelchair)?: A Little Help needed standing up from a chair using your arms (e.g., wheelchair or bedside chair)?: A Little Help needed to walk in hospital room?: A Little Help needed climbing 3-5 steps with a railing? : A Little 6 Click Score: 18    End of Session Equipment Utilized During Treatment: Gait belt Activity Tolerance: Patient tolerated treatment well Patient left: in chair;with chair alarm set;with call bell/phone within reach;with SCD's reapplied Nurse Communication: Mobility  status PT Visit Diagnosis: Muscle weakness (generalized) (M62.81);Difficulty in walking, not elsewhere classified (R26.2)    Time: 6004-5997 PT Time Calculation (min) (ACUTE ONLY): 41 min   Charges:   PT Evaluation $PT Eval Low Complexity: 1 Low PT Treatments $Therapeutic Activity: 8-22 mins        D. Scott Juell Radney PT, DPT 08/26/18, 1:19 PM

## 2018-08-26 NOTE — Evaluation (Signed)
Occupational Therapy Evaluation Patient Details Name: Erika Crawford MRN: [id number] DOB: [birth year] Today's Date: 08/26/2018    History of Present Illness Pt is a 78 yo female diagnosed with cuff arthropathy, right shoulder, and is s/p elective reverse right total shoulder arthroplasty.  PMH includes HTN, arthritis, hypothyroidism, and sleep apnea.     Clinical Impression   Patient was seen for an OT evaluation this date. Pt lives by her self but has PCA assist 4 days/wk for a few hours per day. Prior to surgery, pt was active and independent. Pt has orders for RUE to be immobilized and will be NWBing per MD. Patient presents with impaired strength/ROM, pain, and sensation to RUE with block not completely resolved yet. These impairments result in a decreased ability to perform self care tasks requiring mod assist for UB/LB dressing and bathing and max assist for application of polar care, compression stockings, and sling/immobilizer. Pt/PCA instructed in polar care mgt, compression stockings mgt, sling/immobilizer mgt, ROM exercises for RUE (with instructions for no shoulder exercises until full sensation has returned), RUE precautions, adaptive strategies for bathing/dressing/toileting/grooming, positioning and considerations for sleep, and home/routines modifications to maximize falls prevention, safety, and independence. Handout provided. OT adjusted sling/immobilizer and polar care to improve comfort, optimize positioning, and to maximize skin integrity/safety. Pt/PCA verbalized understanding of all education/training provided. Pt will benefit from skilled OT services to address these limitations and improve independence in daily tasks. Recommend HHOT services to continue therapy to maximize return to PLOF, address home/routines modifications and safety, minimize falls risk, and minimize caregiver burden. RNCM notified of recommendation.    Follow Up Recommendations  Home health OT;Supervision -  Intermittent    Equipment Recommendations  3 in 1 bedside commode    Recommendations for Other Services       Precautions / Restrictions Precautions Precautions: Shoulder Shoulder Interventions: Shoulder sling/immobilizer;Off for dressing/bathing/exercises Restrictions Weight Bearing Restrictions: Yes RUE Weight Bearing: Non weight bearing      Mobility Bed Mobility             General bed mobility comments: deferred, up in recliner at start/end of session  Transfers Overall transfer level: Needs assistance Equipment used: None Transfers: Sit to/from Stand Sit to Stand: Supervision         General transfer comment: Good control and stability with transfers    Balance Overall balance assessment: Needs assistance   Sitting balance-Leahy Scale: Normal     Standing balance support: During functional activity;No upper extremity supported;Single extremity supported Standing balance-Leahy Scale: Good                             ADL either performed or assessed with clinical judgement   ADL Overall ADL's : Needs assistance/impaired Eating/Feeding: Sitting;Set up   Grooming: Sitting;Set up Grooming Details (indicate cue type and reason): PRN Min A for RUE underarm grooming Upper Body Bathing: Sitting;Minimal assistance;Moderate assistance   Lower Body Bathing: Sit to/from stand;Minimal assistance;Moderate assistance   Upper Body Dressing : Sitting;Minimal assistance;Moderate assistance   Lower Body Dressing: Sit to/from stand;Minimal assistance;Moderate assistance   Toilet Transfer: BSC;Min guard                   Vision Patient Visual Report: No change from baseline       Perception     Praxis      Pertinent Vitals/Pain Pain Assessment: No/denies pain     Hand Dominance Right   Extremity/Trunk  Assessment Upper Extremity Assessment Upper Extremity Assessment: RUE deficits/detail;LUE deficits/detail RUE Deficits / Details:  decreased sensation, 2nd-5th digits with normal sensation, pt reports thumb is "completely numb" and forearm to shoulder has decreased sensation RUE: Unable to fully assess due to immobilization RUE Sensation: decreased light touch;decreased proprioception RUE Coordination: decreased fine motor;decreased gross motor LUE Deficits / Details: grossly at least 4/5   Lower Extremity Assessment Lower Extremity Assessment: Generalized weakness   Cervical / Trunk Assessment Cervical / Trunk Assessment: Normal   Communication Communication Communication: No difficulties   Cognition Arousal/Alertness: Awake/alert Behavior During Therapy: WFL for tasks assessed/performed Overall Cognitive Status: Within Functional Limits for tasks assessed                                     General Comments  polar care and shoulder sling/immobilizer adjusted for optimal positioning and skin protection    Exercises Other Exercises: Pt/PCA instructed in shoulder precautions and ROM restrictions, polar care mgt, sling mgt, hemi techniques for dressing, underarm grooming/bathing, falls prevention, compression stocking mgt, and AE/DME to maximize indep/safety with ADL and IADL Tasks   Shoulder Instructions      Home Living Family/patient expects to be discharged to:: Private residence Living Arrangements: Alone Available Help at Discharge: Personal care attendant;Other (Comment)(PCA 4 days/wk for 2.5 hrs/day ) Type of Home: Apartment(Senior living apt with life alert and call bells) Home Access: Level entry     Home Layout: One level     Bathroom Shower/Tub: Teacher, early years/pre: Handicapped height(with arm rests)     Home Equipment: Clinical cytogeneticist - 2 wheels;Walker - 4 wheels;Cane - single point;Cane - quad;Grab bars - tub/shower          Prior Functioning/Environment Level of Independence: Needs assistance  Gait / Transfers Assistance Needed: Pt Ind with amb  without an AD limited community distances, no fall history ADL's / Homemaking Assistance Needed: Ind with ADLs; PCA assists with housework            OT Problem List: Decreased strength;Decreased range of motion;Decreased coordination;Decreased knowledge of use of DME or AE;Decreased knowledge of precautions;Impaired UE functional use;Impaired sensation;Impaired balance (sitting and/or standing)      OT Treatment/Interventions: Self-care/ADL training;Balance training;Therapeutic exercise;Therapeutic activities;DME and/or AE instruction;Patient/family education    OT Goals(Current goals can be found in the care plan section) Acute Rehab OT Goals Patient Stated Goal: To get back home OT Goal Formulation: With patient Time For Goal Achievement: 09/09/18 Potential to Achieve Goals: Good ADL Goals Pt Will Perform Upper Body Dressing: sitting;with caregiver independent in assisting Pt Will Transfer to Toilet: with supervision;ambulating(LRAD for amb, elevated commode) Additional ADL Goal #1: Pt will independently instruct caregiver in compression stocking mgt including donning/doffing, wear schedule, and positioning. Additional ADL Goal #2: Pt will independently instruct caregiver in polar care mgt including donning/doffing, wear schedule, and positioning.  OT Frequency: Min 2X/week   Barriers to D/C:            Co-evaluation              AM-PAC OT "6 Clicks" Daily Activity     Outcome Measure Help from another person eating meals?: A Little Help from another person taking care of personal grooming?: A Little Help from another person toileting, which includes using toliet, bedpan, or urinal?: A Little Help from another person bathing (including washing, rinsing, drying)?: A Lot Help from another  person to put on and taking off regular upper body clothing?: A Lot Help from another person to put on and taking off regular lower body clothing?: A Lot 6 Click Score: 15   End of  Session Nurse Communication: Other (comment)(notified RN and CM that OT assessment was completed)  Activity Tolerance: Patient tolerated treatment well Patient left: in chair;with call bell/phone within reach;with chair alarm set;with family/visitor present  OT Visit Diagnosis: Other abnormalities of gait and mobility (R26.89);Muscle weakness (generalized) (M62.81)                Time: 0097-9499 OT Time Calculation (min): 34 min Charges:  OT General Charges $OT Visit: 1 Visit OT Evaluation $OT Eval Low Complexity: 1 Low OT Treatments $Self Care/Home Management : 23-37 mins  Jeni Salles, MPH, MS, OTR/L ascom [phone] 08/26/18, 2:55 PM

## 2018-08-26 NOTE — Progress Notes (Signed)
OT Cancellation Note  Patient Details Name: Erika Crawford MRN: [id number] DOB: [birth year]   Cancelled Treatment:    Reason Eval/Treat Not Completed: Other (comment). OT consult received, chart reviewed. Met with pt. Pt reports having minimal family/caregiver support at home. Reports she does have a HHA that comes to the home to assist her on a regular basis. But was unable to recall her phone number. Pt requested OT call her agency to get the number. Per pt's request, OT called agency [facility]) and was able to get the [facility] phone number for the pt. Pt then called her HHA (Erika Crawford) and the HHA plans to come to the hospital at 1:30pm today. Pt states, "I couldn't remember everything you were going to tell me by myself."   Will hold OT evaluation this morning and come back when HHA is present to maximize effectiveness, recall, and carryover of learned instruction on polar care mgt, shoulder sling/immobilizer mgt, and precautions regarding ADL and IADL tasks.  Pt expressed appreciation.  Jeni Salles, MPH, MS, OTR/L ascom [phone] 08/26/18, 9:09 AM

## 2018-08-26 NOTE — Care Management (Signed)
Patient on current service with [facility], called Janeece Riggers [phone] to notify of patient discharge, left a secure voice mail and called her current PT person at [phone] she gave me her supervisors number at [phone] Ellison Hughs she stated that they are contracted thru [facility] care but they do not do the PT or OT, She does not have a contact number for [facility].

## 2018-08-27 LAB — SURGICAL PATHOLOGY

## 2018-08-31 NOTE — Care Management (Signed)
Patient called and said noone has called her and has not came to see her.  I gave her the number to the [facility] agency to call and see why they have not came.  I called Sharmon Revere with [facility] to inquire why the patient has not been seen.  I left a secure VM requesting a call back to determine why the patient has not been seen and noone has called

## 2018-08-31 NOTE — Care Management (Signed)
Brion Aliment from [facility] and stated that they will call the patient right now and go out to see her today, I requested for her to send a SW out as well due to the patient saying she can't do this at home,  Malachy Mood stated that she would call the therapist and give a heads up to send a SW

## 2018-09-16 ENCOUNTER — Ambulatory Visit
Payer: 59 | Attending: Student in an Organized Health Care Education/Training Program | Admitting: Student in an Organized Health Care Education/Training Program

## 2018-09-16 ENCOUNTER — Other Ambulatory Visit: Payer: Self-pay

## 2018-09-16 DIAGNOSIS — G894 Chronic pain syndrome: Secondary | ICD-10-CM | POA: Diagnosis not present

## 2018-09-16 DIAGNOSIS — M48061 Spinal stenosis, lumbar region without neurogenic claudication: Secondary | ICD-10-CM

## 2018-09-16 DIAGNOSIS — M5136 Other intervertebral disc degeneration, lumbar region: Secondary | ICD-10-CM | POA: Diagnosis not present

## 2018-09-16 DIAGNOSIS — M5416 Radiculopathy, lumbar region: Secondary | ICD-10-CM

## 2018-09-16 DIAGNOSIS — M51369 Other intervertebral disc degeneration, lumbar region without mention of lumbar back pain or lower extremity pain: Secondary | ICD-10-CM

## 2018-09-16 DIAGNOSIS — M19011 Primary osteoarthritis, right shoulder: Secondary | ICD-10-CM

## 2018-09-16 NOTE — Progress Notes (Signed)
Pain Management Encounter Note - Virtual Visit via Telephone Telehealth (real-time audio visits between healthcare provider and patient).  Patient's Phone No. & Preferred Pharmacy:  [phone] (home); [phone] (mobile); (Preferred) [phone]  [facility], [address] [place] [address] Phone: [phone] Fax: [phone]   Pre-screening note:  Our staff contacted Erika Crawford and offered her an "in person", "face-to-face" appointment versus a telephone encounter. She indicated preferring the telephone encounter, at this time.  Reason for Virtual Visit: COVID-19*  Social distancing based on CDC and AMA recommendations.   I contacted [name] on 09/16/2018 at 8:47 AM by telephone and clearly identified myself as [name], MD. I verified that I was speaking with the correct person using two identifiers (Name and date of birth: 11-Oct-1940).  Advanced Informed Consent I sought verbal advanced consent from [name] for telemedicine interactions and virtual visit. I informed Erika Crawford of the security and privacy concerns, risks, and limitations associated with performing an evaluation and management service by telephone. I also informed Erika Crawford of the availability of "in person" appointments and I informed her of the possibility of a patient responsible charge related to this service. Erika Crawford expressed understanding and agreed to proceed.   Historic Elements   [name] is a 78 y.o. year old, female patient evaluated today after her last encounter by our practice on 08/12/2018. [name]  has a past medical history of Arthritis, Complication of anesthesia, Hypertension, Hypothyroidism, Sleep apnea, and Thyroid disease. She also  has a past surgical history that includes Abdominal hysterectomy; Colonoscopy with propofol (N/A, 09/21/2015); Back surgery; Total knee  arthroplasty (Left, 03/21/2016); Joint replacement; and Reverse shoulder arthroplasty (Right, 08/25/2018). [name] has a current medication list which includes the following prescription(s): alendronate, amlodipine, lidocaine, cholecalciferol, gabapentin, homeopathic products, hydrocodone-acetaminophen, levothyroxine, menthol (topical analgesic), menthol (topical analgesic), mometasone, naphazoline-pheniramine, olmesartan, and omeprazole. She  reports that she has quit smoking. She has never used smokeless tobacco. She reports current alcohol use. She reports that she does not use drugs. Erika Crawford is allergic to celebrex [celecoxib]; codeine; etodolac; hydrochlorothiazide; oxycodone; pravastatin; relafen [nabumetone]; and tramadol.   HPI  I last saw her on 08/12/2018. She is being evaluated for a post-procedure assessment.  Status post left L5-S1 ESI on 08/12/2018 for lumbar radicular pain.  Patient also had right shoulder arthroplasty on 08/25/2018.  She states that she is recovering well from her shoulder surgery.  Physical therapy is coming to her house 3 times a week to work with her.  Post-Procedure Evaluation  Procedure: Left L5-S1 ESI Pre-procedure pain level:  4/10 Post-procedure: 4/10          Sedation: Please see nurses note.  Effectiveness during initial hour after procedure(Ultra-Short Term Relief):100 %  Local anesthetic used: Long-acting (4-6 hours) Effectiveness: Defined as any analgesic benefit obtained secondary to the administration of local anesthetics. This carries significant diagnostic value as to the etiological location, or anatomical origin, of the pain. Duration of benefit is expected to coincide with the duration of the local anesthetic used.  Effectiveness during initial 4-6 hours after procedure(Short-Term Relief): 100 %  Long-term benefit: Defined as any relief past the pharmacologic duration of the local anesthetics.  Effectiveness past the initial 6 hours after  procedure(Long-Term Relief): 60%  Current benefits: Defined as benefit that persist at this time.   Analgesia:  50% improved  Function: Erika Crawford reports improvement in function ROM: Somewhat improved    Review of recent tests  DG Shoulder Right Port CLINICAL DATA:  78 year old female for right shoulder replacement. Subsequent encounter.  EXAM: PORTABLE RIGHT SHOULDER  COMPARISON:  08/06/2018 CT.  FINDINGS: Post reversed total right shoulder replacement. Question fracture along proximal humeral shaft (medial aspect).  Acromioclavicular joint degenerative changes.  IMPRESSION: Post reversed total right shoulder replacement. Question fracture along proximal humeral shaft (medial aspect).  These results will be called to the ordering clinician or representative by the Radiologist Assistant, and communication documented in the PACS or zVision Dashboard.  Electronically Signed   By: Genia Del M.D.   On: 08/25/2018 12:05   Admission on 08/25/2018, Discharged on 08/26/2018  Component Date Value Ref Range Status  . SURGICAL PATHOLOGY 08/25/2018    Final                   Value:Surgical Pathology CASE: [id number] PATIENT: Erika Crawford Surgical Pathology Report     SPECIMEN SUBMITTED: A. Humeral head, right  CLINICAL HISTORY: None provided  PRE-OPERATIVE DIAGNOSIS: Rotator cuff tendinitis, right  POST-OPERATIVE DIAGNOSIS: Same as pre op     DIAGNOSIS: A. BONE, RIGHT HUMERAL HEAD; ARTHROPLASTY: - DEGENERATIVE OSTEOARTHROPATHY. - UNDERLYING MARROW WITH TRILINEAGE HEMATOPOIESIS.   GROSS DESCRIPTION: A. Labeled: Right humeral head Received: Formalin Size of specimen: 6.2 x 5.5 x 2.8 cm Articular surface: The articular surface is abnormally shaped with numerous tan, exophytic nodules.  The remaining articular surface is tan with a 3.7 cm in greatest dimension area of eburnation. Cut surface: Sectioning displays tan-yellow, grossly unremarkable  bone parenchyma. Other findings: The cut surface is tan-yellow with a clean cut.  Block summary: 1 - articular surface displaying eburnation (following decalcification) 2 - abno                         rmally shaped articular surface with exophytic nodules (following decalcification)    Final Diagnosis performed by Raynelle Bring, MD.   Electronically signed 08/27/2018 11:28:49AM The electronic signature indicates that the named Attending Pathologist has evaluated the specimen  Technical component performed at [facility], [address], [place], [place] [address] Lab: [id number] Dir: Rush Farmer, MD, MMM  Professional component performed at [facility], [facility], [place], [place], [place] [address] Lab: [id number] Dir: Dellia Nims. Rubinas, MD   . Sodium 08/26/2018 142  135 - 145 mmol/L Final  . Potassium 08/26/2018 3.3* 3.5 - 5.1 mmol/L Final  . Chloride 08/26/2018 110  98 - 111 mmol/L Final  . CO2 08/26/2018 24  22 - 32 mmol/L Final  . Glucose, Bld 08/26/2018 127* 70 - 99 mg/dL Final  . BUN 08/26/2018 9  8 - 23 mg/dL Final  . Creatinine, Ser 08/26/2018 0.50  0.44 - 1.00 mg/dL Final  . Calcium 08/26/2018 8.3* 8.9 - 10.3 mg/dL Final  . GFR calc non Af Amer 08/26/2018 >60  >60 mL/min Final  . GFR calc Af Amer 08/26/2018 >60  >60 mL/min Final  . Anion gap 08/26/2018 8  5 - 15 Final   Performed at [facility], [address], [place], [place] [address]  . WBC 08/26/2018 10.7* 4.0 - 10.5 K/uL Final  . RBC 08/26/2018 3.50* 3.87 - 5.11 MIL/uL Final  . Hemoglobin 08/26/2018 11.3* 12.0 - 15.0 g/dL Final  . HCT 08/26/2018 34.5* 36.0 - 46.0 % Final  . MCV 08/26/2018 98.6  80.0 - 100.0 fL Final  .  MCH 08/26/2018 32.3  26.0 - 34.0 pg Final  . MCHC 08/26/2018 32.8  30.0 - 36.0 g/dL Final  . RDW 08/26/2018 14.4  11.5 - 15.5 % Final  . Platelets 08/26/2018 165  150 - 400 K/uL Final  . nRBC 08/26/2018 0.0  0.0 - 0.2 % Final  . Neutrophils Relative %  08/26/2018 78  % Final  . Neutro Abs 08/26/2018 8.3* 1.7 - 7.7 K/uL Final  . Lymphocytes Relative 08/26/2018 14  % Final  . Lymphs Abs 08/26/2018 1.5  0.7 - 4.0 K/uL Final  . Monocytes Relative 08/26/2018 8  % Final  . Monocytes Absolute 08/26/2018 0.8  0.1 - 1.0 K/uL Final  . Eosinophils Relative 08/26/2018 0  % Final  . Eosinophils Absolute 08/26/2018 0.0  0.0 - 0.5 K/uL Final  . Basophils Relative 08/26/2018 0  % Final  . Basophils Absolute 08/26/2018 0.0  0.0 - 0.1 K/uL Final  . Immature Granulocytes 08/26/2018 0  % Final  . Abs Immature Granulocytes 08/26/2018 0.04  0.00 - 0.07 K/uL Final   Performed at [facility], [address], [place], [place] [address]   Assessment  The primary encounter diagnosis was Lumbar radiculopathy. Diagnoses of Chronic pain syndrome, Lumbar foraminal stenosis, Lumbar degenerative disc disease, and Primary osteoarthritis of right shoulder were also pertinent to this visit.  Plan of Care  I discussed the assessment and treatment plan with the patient. The patient was provided an opportunity to ask questions and all were answered. The patient agreed with the plan and demonstrated an understanding of the instructions.  Patient advised to call back or seek an in-person evaluation if the symptoms or condition worsens.  I am having [name] maintain her alendronate, levothyroxine, mometasone, omeprazole, olmesartan, cholecalciferol, amLODipine, ASPERCREME LIDOCAINE EX, Homeopathic Products (THERAWORX RELIEF EX), gabapentin, (Menthol, Topical Analgesic, (BIOFREEZE EX)), (Menthol, Topical Analgesic, (TWO OLD GOATS ARTHRITIS EX)), Naphazoline-Pheniramine (OPCON-A OP), and HYDROcodone-acetaminophen. Pharmacotherapy (Medications Ordered): No orders of the defined types were placed in this encounter.  Orders:  Orders Placed This Encounter  Procedures  . Lumbar Epidural Injection    Standing Status:   Standing    Number of Occurrences:   9     Standing Expiration Date:   03/17/2020    Scheduling Instructions:     Purpose: Therapeutic     Indication: Lower extremity pain/Sciatica left (M54.32).     Side: Left-sided     Level: L5-S1     Sedation: No Sedation.     TIMEFRAME: PRN procedure. (Ms. Illingworth will call when needed.)    Order Specific Question:   Where will this procedure be performed?    Answer:   [facility]   Follow-up plan:   Return if symptoms worsen or fail to improve, for Procedure.   Total duration of non-face-to-face encounter: 11 minutes.  Note by: [name], MD Date: 09/16/2018; Time: 8:47 AM  Disclaimer:  * Given the special circumstances of the COVID-19 pandemic, the federal government has announced that the Office for Civil Rights (OCR) will exercise its enforcement discretion and will not impose penalties on physicians using telehealth in the event of noncompliance with regulatory requirements under the [place] and [place] (HIPAA) in connection with the good faith provision of telehealth during the FFMBW-46 national public health emergency. ([place])

## 2018-11-16 IMAGING — US US EXTREM LOW VENOUS*L*
1 series · 15 of 24 positions shown · non-contrast
Comparison: None.

CLINICAL DATA: 75-year-old female with left leg pain. Recent left
knee arthroplasty.



[Series 1: us extrem low venous*left* · 15 of 33 slices shown]
[im 1/33]
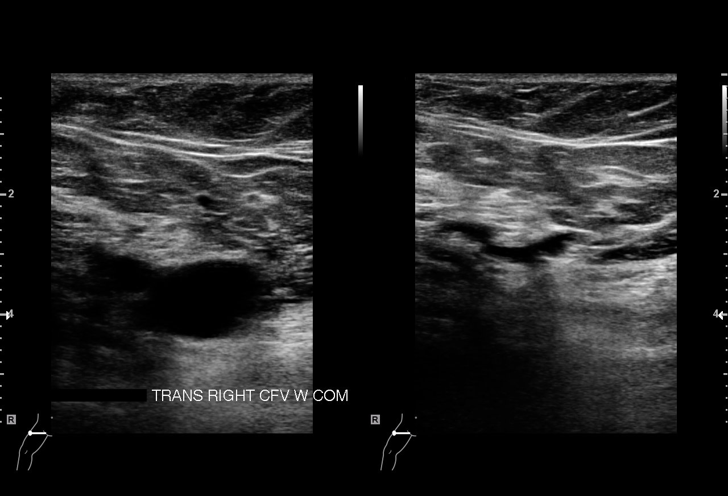
[im 3/33]
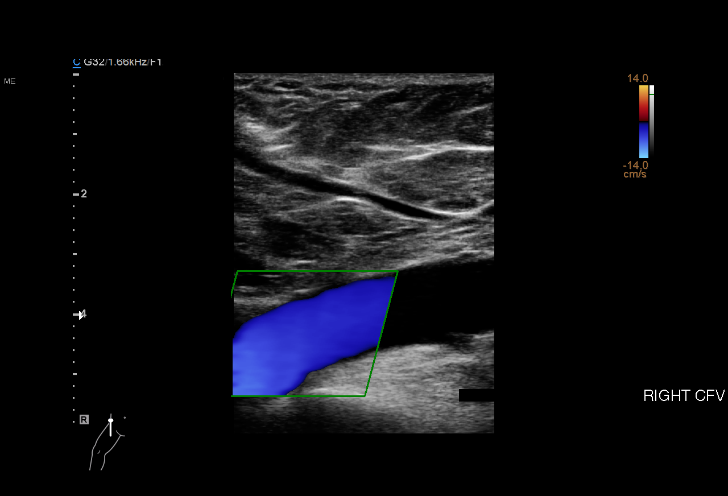
[im 6/33]
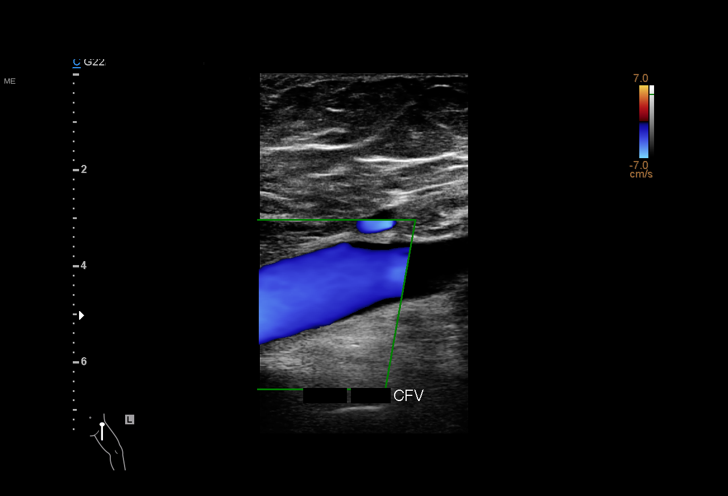
[im 7/33]
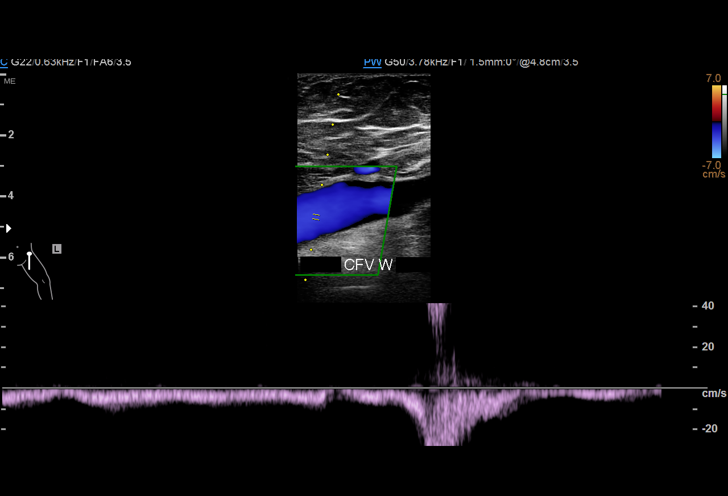
[im 10/33]
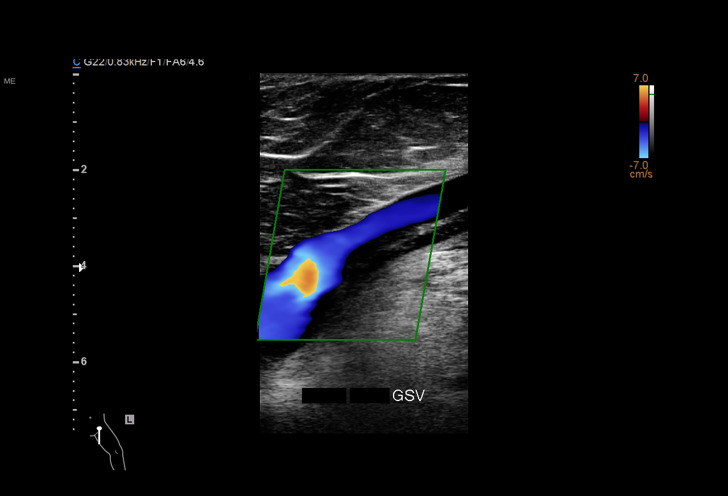
[im 12/33]
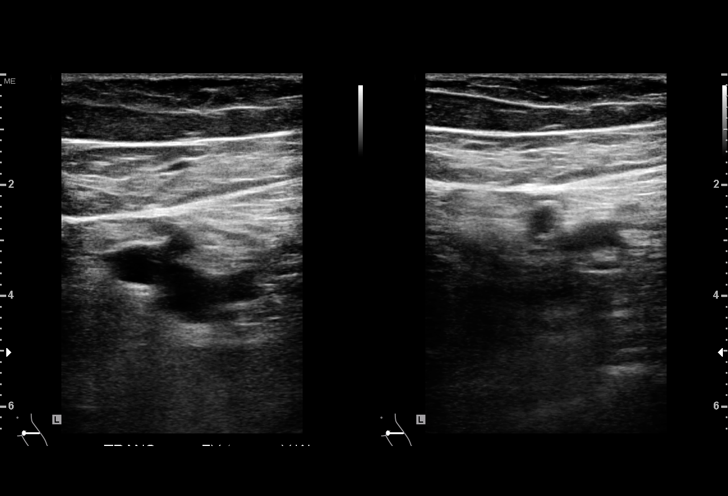
[im 14/33]
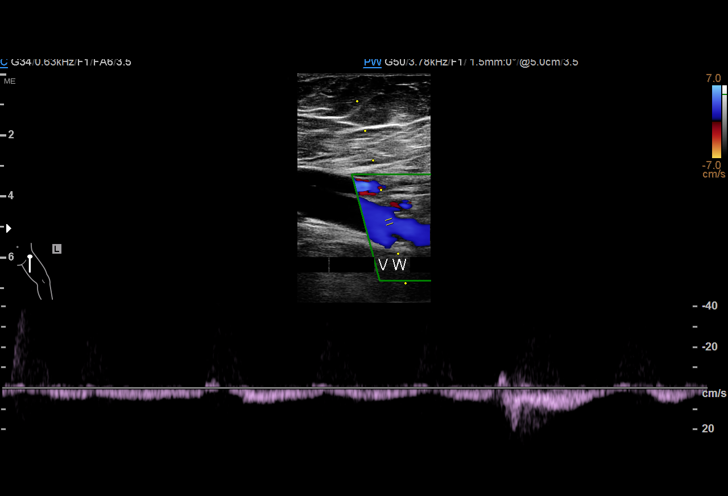
[im 17/33]
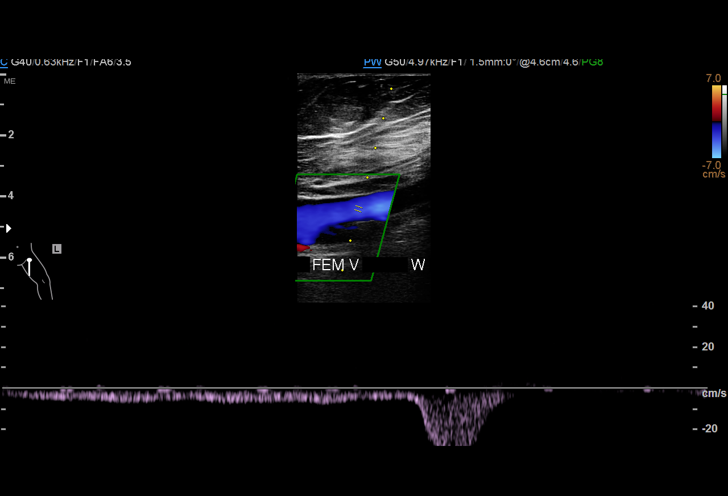
[im 19/33]
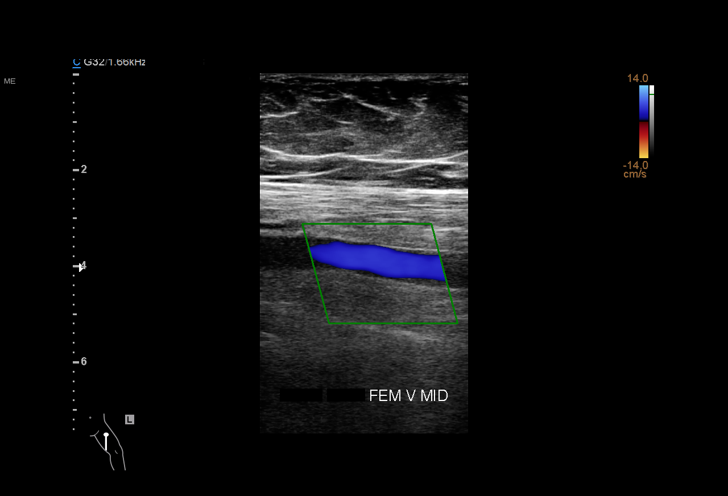
[im 21/33]
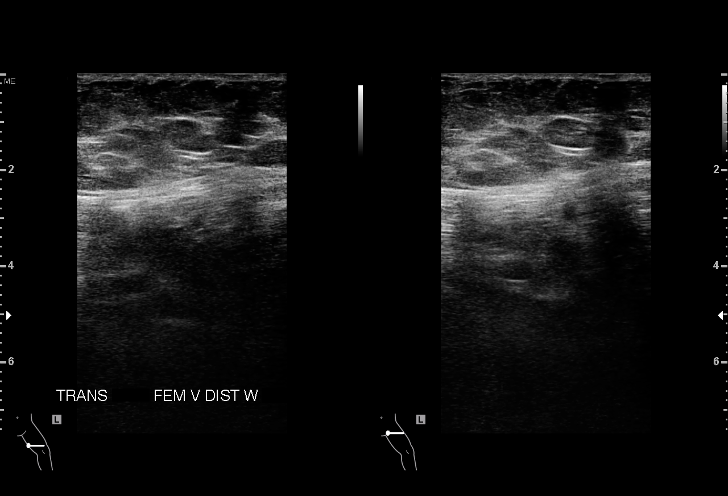
[im 23/33]
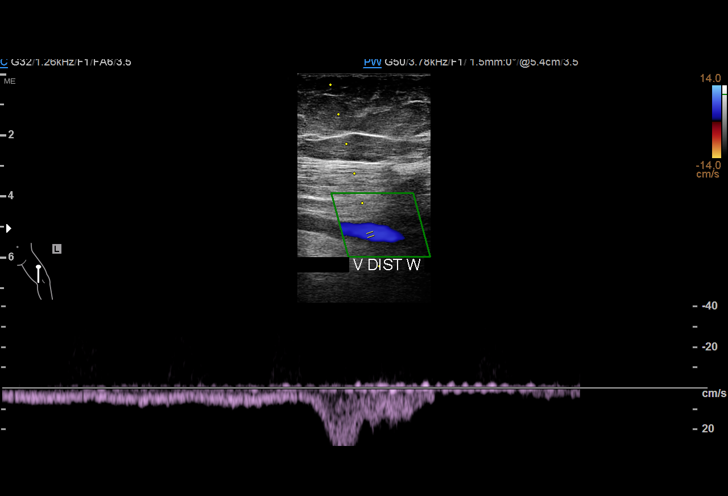
[im 26/33]
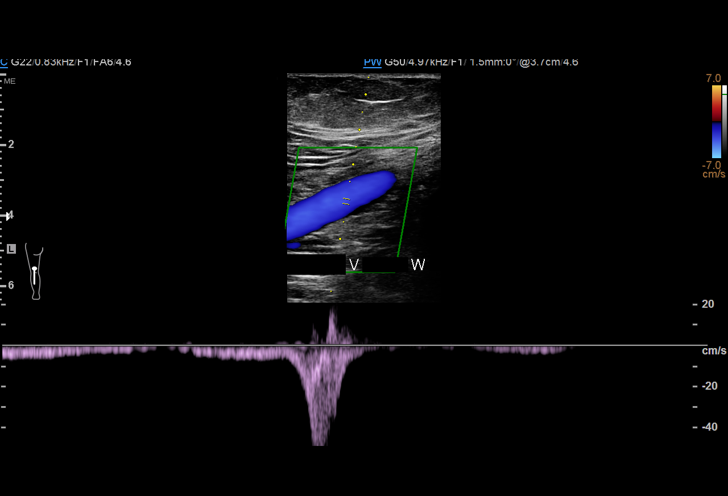
[im 28/33]
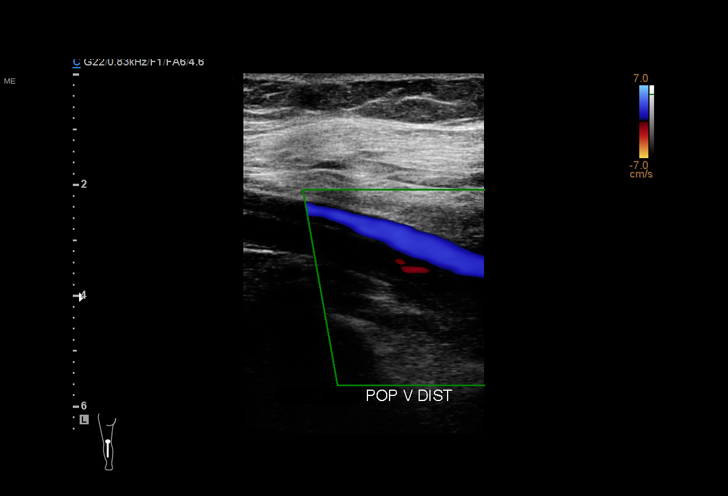
[im 30/33]
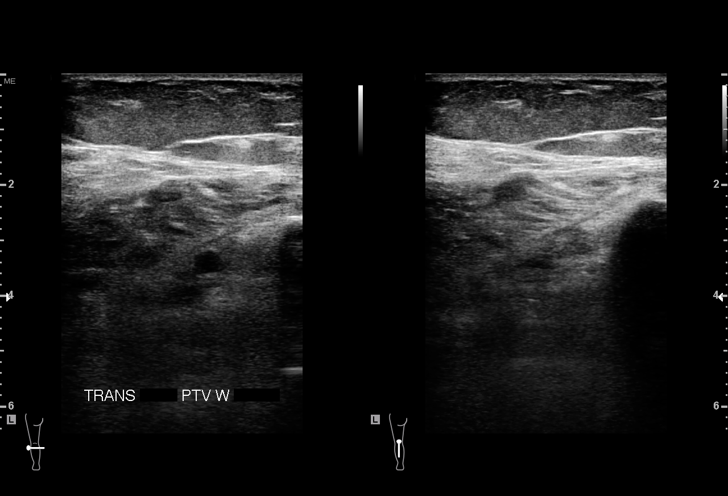
[im 33/33]
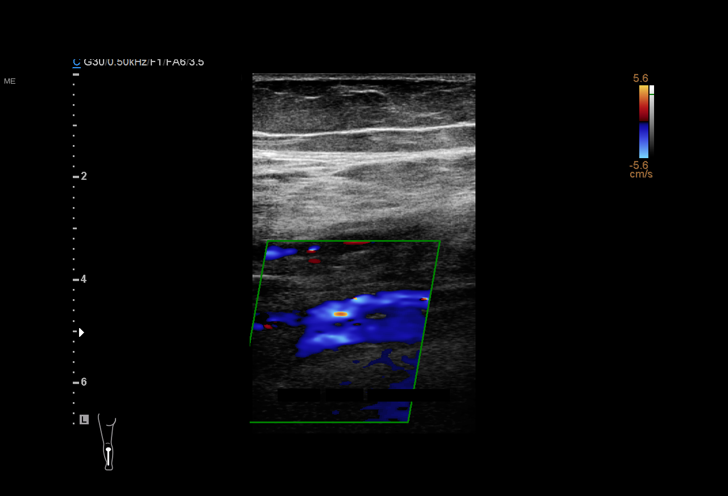

[15 of 24 positions shown; findings below may reference images not displayed]

FINDINGS: Contralateral Common Femoral Vein: Respiratory phasicity is normal
and symmetric with the symptomatic side. No evidence of thrombus.
Normal compressibility.

Common Femoral Vein: No evidence of thrombus. Normal
compressibility, respiratory phasicity and response to augmentation.

Saphenofemoral Junction: No evidence of thrombus. Normal
compressibility and flow on color Doppler imaging.

Profunda Femoral Vein: No evidence of thrombus. Normal
compressibility and flow on color Doppler imaging.

Femoral Vein: No evidence of thrombus. Normal compressibility,
respiratory phasicity and response to augmentation.

Popliteal Vein: No evidence of thrombus. Normal compressibility,
respiratory phasicity and response to augmentation.

Calf Veins: No evidence of thrombus. Normal compressibility and flow
on color Doppler imaging.

Superficial Great Saphenous Vein: No evidence of thrombus. Normal
compressibility and flow on color Doppler imaging.

Venous Reflux:  None.

Other Findings:  None.
IMPRESSION: No evidence of deep venous thrombosis.

## 2018-12-23 ENCOUNTER — Other Ambulatory Visit
Admission: RE | Admit: 2018-12-23 | Discharge: 2018-12-23 | Disposition: A | Discharge status: Home / Self Care | Payer: 59 | Source: Ambulatory Visit | Attending: Student in an Organized Health Care Education/Training Program | Admitting: Student in an Organized Health Care Education/Training Program

## 2018-12-23 ENCOUNTER — Other Ambulatory Visit: Payer: Self-pay

## 2018-12-23 DIAGNOSIS — Z1159 Encounter for screening for other viral diseases: Secondary | ICD-10-CM | POA: Insufficient documentation

## 2018-12-23 DIAGNOSIS — Z0182 Encounter for allergy testing: Secondary | ICD-10-CM | POA: Diagnosis not present

## 2018-12-23 DIAGNOSIS — Z01812 Encounter for preprocedural laboratory examination: Secondary | ICD-10-CM | POA: Diagnosis present

## 2018-12-24 LAB — SARS CORONAVIRUS 2 (TAT 6-24 HRS): SARS Coronavirus 2: NEGATIVE

## 2018-12-28 ENCOUNTER — Ambulatory Visit: Payer: 59 | Admitting: Student in an Organized Health Care Education/Training Program

## 2018-12-30 ENCOUNTER — Other Ambulatory Visit: Payer: Self-pay

## 2018-12-30 ENCOUNTER — Other Ambulatory Visit
Admission: RE | Admit: 2018-12-30 | Discharge: 2018-12-30 | Disposition: A | Discharge status: Home / Self Care | Payer: 59 | Source: Ambulatory Visit | Attending: Student in an Organized Health Care Education/Training Program | Admitting: Student in an Organized Health Care Education/Training Program

## 2018-12-30 DIAGNOSIS — Z1159 Encounter for screening for other viral diseases: Secondary | ICD-10-CM | POA: Insufficient documentation

## 2018-12-30 LAB — SARS CORONAVIRUS 2 (TAT 6-24 HRS): SARS Coronavirus 2: NEGATIVE

## 2019-01-04 ENCOUNTER — Ambulatory Visit (HOSPITAL_BASED_OUTPATIENT_CLINIC_OR_DEPARTMENT_OTHER): Payer: 59 | Admitting: Student in an Organized Health Care Education/Training Program

## 2019-01-04 ENCOUNTER — Other Ambulatory Visit: Payer: Self-pay

## 2019-01-04 ENCOUNTER — Encounter: Payer: Self-pay | Admitting: Student in an Organized Health Care Education/Training Program

## 2019-01-04 ENCOUNTER — Ambulatory Visit
Admission: RE | Admit: 2019-01-04 | Discharge: 2019-01-04 | Disposition: A | Discharge status: Home / Self Care | Payer: 59 | Source: Ambulatory Visit | Attending: Student in an Organized Health Care Education/Training Program | Admitting: Student in an Organized Health Care Education/Training Program

## 2019-01-04 DIAGNOSIS — M5416 Radiculopathy, lumbar region: Secondary | ICD-10-CM | POA: Insufficient documentation

## 2019-01-04 MED ORDER — DEXAMETHASONE SODIUM PHOSPHATE 10 MG/ML IJ SOLN
INTRAMUSCULAR | Status: AC
  Filled 2019-01-04: qty 1

## 2019-01-04 MED ORDER — SODIUM CHLORIDE 0.9% FLUSH
1.0000 mL | Freq: Once | INTRAVENOUS | Status: AC
  Administered 2019-01-04: 1 mL

## 2019-01-04 MED ORDER — ROPIVACAINE HCL 2 MG/ML IJ SOLN
INTRAMUSCULAR | Status: AC
  Filled 2019-01-04: qty 10

## 2019-01-04 MED ORDER — IOHEXOL 180 MG/ML  SOLN
10.0000 mL | Freq: Once | INTRAMUSCULAR | Status: AC
  Administered 2019-01-04: 10 mL via EPIDURAL

## 2019-01-04 MED ORDER — DEXAMETHASONE SODIUM PHOSPHATE 10 MG/ML IJ SOLN
10.0000 mg | Freq: Once | INTRAMUSCULAR | Status: AC
  Administered 2019-01-04: 10 mg

## 2019-01-04 MED ORDER — LIDOCAINE HCL (PF) 1 % IJ SOLN
INTRAMUSCULAR | Status: AC
  Filled 2019-01-04: qty 5

## 2019-01-04 MED ORDER — SODIUM CHLORIDE (PF) 0.9 % IJ SOLN
INTRAMUSCULAR | Status: AC
  Filled 2019-01-04: qty 10

## 2019-01-04 MED ORDER — ROPIVACAINE HCL 2 MG/ML IJ SOLN
1.0000 mL | Freq: Once | INTRAMUSCULAR | Status: AC
  Administered 2019-01-04: 1 mL via EPIDURAL

## 2019-01-04 MED ORDER — LIDOCAINE HCL 2 % IJ SOLN
20.0000 mL | Freq: Once | INTRAMUSCULAR | Status: AC
  Administered 2019-01-04: 400 mg

## 2019-01-04 NOTE — Progress Notes (Signed)
Safety precautions to be maintained throughout the outpatient stay will include: orient to surroundings, keep bed in low position, maintain call bell within reach at all times, provide assistance with transfer out of bed and ambulation.  

## 2019-01-04 NOTE — Progress Notes (Signed)
Patient's Name: Erika Crawford  MRN: [id number]  Referring Provider: [name], MD  DOB: Jun 25, 1940  PCP: Glendon Axe, MD  DOS: 01/04/2019  Note by: [name], MD  Service setting: Ambulatory outpatient  Specialty: Interventional Pain Management  Patient type: Established  Location: [facility] (AMB) Pain Management Facility  Visit type: Interventional Procedure   Primary Reason for Visit: Interventional Pain Management Treatment. CC: Back Pain (lumbar bilateral )  Procedure:       Anesthesia, Analgesia, Anxiolysis:  Type: Therapeutic Inter-Laminar Epidural Steroid Injection  Region: Lumbar Level: L5-S1 Level. Laterality: Midline         Type: Local Anesthesia Indication(s): Analgesia         Route: Infiltration (/IM) IV Access: Declined Sedation: Declined  Local Anesthetic: Lidocaine 1-2%   Indications: 1. Lumbar radiculopathy    Pain Score: Pre-procedure: 3/10 Post-procedure: 0-No pain/10  Pre-op Assessment:  Ms. Koloski is a 78 y.o. (year old), female patient, seen today for interventional treatment. 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 Ms. Cotrell has a current medication list which includes the following prescription(s): acetaminophen, alendronate, amlodipine, lidocaine, cholecalciferol, cyclobenzaprine, homeopathic products, hydrocodone-acetaminophen, levothyroxine, magnesium oxide, menthol (topical analgesic), mometasone, naphazoline-pheniramine, olmesartan, omeprazole, gabapentin, and menthol (topical analgesic). Her primarily concern today is the Back Pain (lumbar bilateral )  Initial Vital Signs:  Pulse Rate: 91 Temp: 98.7 F (37.1 C) Resp: 16 BP: 133/67 SpO2: 100 %  BMI: Estimated body mass index is 32.89 kg/m as calculated from the following:   Height as of  this encounter: 5\' 7"  (1.702 m).   Weight as of this encounter: 210 lb (95.3 kg).  Risk Assessment: Allergies: Reviewed. She is allergic to celebrex [celecoxib]; codeine; etodolac; hydrochlorothiazide; oxycodone; pravastatin; relafen [nabumetone]; and tramadol.  Allergy Precautions: None required Coagulopathies: Reviewed. None identified.  Blood-thinner therapy: None at this time Active Infection(s): Reviewed. None identified. Ms. Kozlowski is afebrile  Site Confirmation: Ms. Dundon was asked to confirm the procedure and laterality before marking the site Procedure checklist: Completed Consent: Before the procedure and under the influence of no sedative(s), amnesic(s), or anxiolytics, the patient was informed of the treatment options, risks and possible complications. To fulfill our ethical and legal obligations, as recommended by the American Medical Association's Code of Ethics, I have informed the patient of my clinical impression; the nature and purpose of the treatment or procedure; the risks, benefits, and possible complications of the intervention; the alternatives, including doing nothing; the risk(s) and benefit(s) of the alternative treatment(s) or procedure(s); and the risk(s) and benefit(s) of doing nothing. The patient was provided information about the general risks and possible complications associated with the procedure. These may include, but are not limited to: failure to achieve desired goals, infection, bleeding, organ or nerve damage, allergic reactions, paralysis, and death. In addition, the patient was informed of those risks and complications associated to Spine-related procedures, such as failure to decrease pain; infection (i.e.: Meningitis, epidural or intraspinal abscess); bleeding (i.e.: epidural hematoma, subarachnoid hemorrhage, or any other type of intraspinal or peri-dural bleeding); organ or nerve damage (i.e.: Any type of peripheral nerve, nerve root, or spinal cord  injury) with subsequent damage to sensory, motor, and/or autonomic systems, resulting in permanent pain, numbness, and/or weakness of one or several areas of the body; allergic reactions; (i.e.: anaphylactic reaction); and/or death. Furthermore, the patient was informed of those risks  and complications associated with the medications. These include, but are not limited to: allergic reactions (i.e.: anaphylactic or anaphylactoid reaction(s)); adrenal axis suppression; blood sugar elevation that in diabetics may result in ketoacidosis or comma; water retention that in patients with history of congestive heart failure may result in shortness of breath, pulmonary edema, and decompensation with resultant heart failure; weight gain; swelling or edema; medication-induced neural toxicity; particulate matter embolism and blood vessel occlusion with resultant organ, and/or nervous system infarction; and/or aseptic necrosis of one or more joints. Finally, the patient was informed that Medicine is not an exact science; therefore, there is also the possibility of unforeseen or unpredictable risks and/or possible complications that may result in a catastrophic outcome. The patient indicated having understood very clearly. We have given the patient no guarantees and we have made no promises. Enough time was given to the patient to ask questions, all of which were answered to the patient's satisfaction. Ms. Pina has indicated that she wanted to continue with the procedure. Attestation: I, the ordering provider, attest that I have discussed with the patient the benefits, risks, side-effects, alternatives, likelihood of achieving goals, and potential problems during recovery for the procedure that I have provided informed consent. Date  Time: 01/04/2019  1:03 PM  Pre-Procedure Preparation:  Monitoring: As per clinic protocol. Respiration, ETCO2, SpO2, BP, heart rate and rhythm monitor placed and checked for adequate  function Safety Precautions: Patient was assessed for positional comfort and pressure points before starting the procedure. Time-out: I initiated and conducted the "Time-out" before starting the procedure, as per protocol. The patient was asked to participate by confirming the accuracy of the "Time Out" information. Verification of the correct person, site, and procedure were performed and confirmed by me, the nursing staff, and the patient. "Time-out" conducted as per Joint Commission's Universal Protocol (UP.01.01.01). Time: 1325  Description of Procedure:       Position: Prone with head of the table was raised to facilitate breathing. Target Area: The interlaminar space, initially targeting the lower laminar border of the superior vertebral body. Approach: Paramedial approach. Area Prepped: Entire Posterior Lumbar Region Prepping solution: ChloraPrep (2% chlorhexidine gluconate and 70% isopropyl alcohol) Safety Precautions: Aspiration looking for blood return was conducted prior to all injections. At no point did we inject any substances, as a needle was being advanced. No attempts were made at seeking any paresthesias. Safe injection practices and needle disposal techniques used. Medications properly checked for expiration dates. SDV (single dose vial) medications used. Description of the Procedure: Protocol guidelines were followed. The procedure needle was introduced through the skin, ipsilateral to the reported pain, and advanced to the target area. Bone was contacted and the needle walked caudad, until the lamina was cleared. The epidural space was identified using "loss-of-resistance technique" with 2-3 ml of PF-NaCl (0.9% NSS), in a 5cc LOR glass syringe. Vitals:   01/04/19 1302 01/04/19 1304 01/04/19 1326 01/04/19 1333  BP: 133/67 (!) 147/91 (!) 147/91 (!) 157/72  Pulse: 91     Resp: 16 16 18 16   Temp: 98.7 F (37.1 C)     TempSrc: Oral     SpO2: 100% 100% 100% 100%  Weight: 210 lb  (95.3 kg)     Height: 5\' 7"  (1.702 m)       Start Time: 1325 hrs. End Time: 1331 hrs. Materials:  Needle(s) Type: Epidural needle Gauge: 17G Length: 5-in Medication(s): Please see orders for medications and dosing details. 8 CC solution made of 5 cc of  preservative-free saline, 2 cc of 0.2% ropivacaine, 1 cc of Decadron 10 mill grams per cc Imaging Guidance (Spinal):  Type of Imaging Technique: Fluoroscopy Guidance (Spinal) Indication(s): Assistance in needle guidance and placement for procedures requiring needle placement in or near specific anatomical locations not easily accessible without such assistance. Exposure Time: Please see nurses notes. Contrast: Before injecting any contrast, we confirmed that the patient did not have an allergy to iodine, shellfish, or radiological contrast. Once satisfactory needle placement was completed at the desired level, radiological contrast was injected. Contrast injected under live fluoroscopy. No contrast complications. See chart for type and volume of contrast used. Fluoroscopic Guidance: I was personally present during the use of fluoroscopy. "Tunnel Vision Technique" used to obtain the best possible view of the target area. Parallax error corrected before commencing the procedure. "Direction-depth-direction" technique used to introduce the needle under continuous pulsed fluoroscopy. Once target was reached, antero-posterior, oblique, and lateral fluoroscopic projection used confirm needle placement in all planes. Images permanently stored in EMR. Interpretation: I personally interpreted the imaging intraoperatively. Adequate needle placement confirmed in multiple planes. Appropriate spread of contrast into desired area was observed. No evidence of afferent or efferent intravascular uptake. No intrathecal or subarachnoid spread observed. Permanent images saved into the patient's record.  Antibiotic Prophylaxis:   Anti-infectives (From admission,  onward)   None     Indication(s): None identified  Post-operative Assessment:  Post-procedure Vital Signs:  Pulse Rate: 91 Temp: 98.7 F (37.1 C) Resp: 16 BP: (!) 157/72 SpO2: 100 %  EBL: None  Complications: No immediate post-treatment complications observed by team, or reported by patient.  Note: The patient tolerated the entire procedure well. A repeat set of vitals were taken after the procedure and the patient was kept under observation following institutional policy, for this type of procedure. Post-procedural neurological assessment was performed, showing return to baseline, prior to discharge. The patient was provided with post-procedure discharge instructions, including a section on how to identify potential problems. Should any problems arise concerning this procedure, the patient was given instructions to immediately contact us, at any time, without hesitation. In any case, we plan to contact the patient by telephone for a follow-up status report regarding this interventional procedure.  Comments:  No additional relevant information.  5 out of 5 strength bilateral lower extremity: Plantar flexion, dorsiflexion, knee flexion, knee extension.  Plan of Care    Imaging Orders     DG PAIN CLINIC C-ARM 1-60 MIN NO REPORT Procedure Orders    No procedure(s) ordered today    Medications ordered for procedure: Meds ordered this encounter  Medications  . iohexol (OMNIPAQUE) 180 MG/ML injection 10 mL    Must be Myelogram-compatible. If not available, you may substitute with a water-soluble, non-ionic, hypoallergenic, myelogram-compatible radiological contrast medium.  Marland Kitchen lidocaine (XYLOCAINE) 2 % (with pres) injection 400 mg  . sodium chloride flush (NS) 0.9 % injection 1 mL  . ropivacaine (PF) 2 mg/mL (0.2%) (NAROPIN) injection 1 mL  . dexamethasone (DECADRON) injection 10 mg   Medications administered: We administered iohexol, lidocaine, sodium chloride flush, ropivacaine  (PF) 2 mg/mL (0.2%), and dexamethasone.  See the medical record for exact dosing, route, and time of administration.  New Prescriptions   No medications on file   Disposition: Discharge home  Discharge Date & Time: 01/04/2019; 1345 hrs.   Physician-requested Follow-up: Return in about 5 weeks (around 02/08/2019) for Post Procedure Evaluation, virtual.  Future Appointments  Date Time Provider [place]  02/08/2019  1:30 PM [name], MD [facility] None   Primary Care Physician: Glendon Axe, MD Location: [facility] Note by: [name], MD Date: 01/04/2019; Time: 4:02 PM  Disclaimer:  Medicine is not an Chief Strategy Officer. The only guarantee in medicine is that nothing is guaranteed. It is important to note that the decision to proceed with this intervention was based on the information collected from the patient. The Data and conclusions were drawn from the patient's questionnaire, the interview, and the physical examination. Because the information was provided in large part by the patient, it cannot be guaranteed that it has not been purposely or unconsciously manipulated. Every effort has been made to obtain as much relevant data as possible for this evaluation. It is important to note that the conclusions that lead to this procedure are derived in large part from the available data. Always take into account that the treatment will also be dependent on availability of resources and existing treatment guidelines, considered by other Pain Management Practitioners as being common knowledge and practice, at the time of the intervention. For Medico-Legal purposes, it is also important to point out that variation in procedural techniques and pharmacological choices are the acceptable norm. The indications, contraindications, technique, and results of the above procedure should only be interpreted and judged by a Board-Certified Interventional Pain Specialist with  extensive familiarity and expertise in the same exact procedure and technique.

## 2019-01-04 NOTE — Patient Instructions (Signed)
Pain Management Discharge Instructions  General Discharge Instructions :  If you need to reach your doctor call: Monday-Friday 8:00 am - 4:00 pm at [phone] or toll free [phone].  After clinic hours [phone] to have operator reach doctor.  Bring all of your medication bottles to all your appointments in the pain clinic.  To cancel or reschedule your appointment with Pain Management please remember to call 24 hours in advance to avoid a fee.  Refer to the educational materials which you have been given on: General Risks, I had my Procedure. Discharge Instructions, Post Sedation.  Post Procedure Instructions:  The drugs you were given will stay in your system until tomorrow, so for the next 24 hours you should not drive, make any legal decisions or drink any alcoholic beverages.  You may eat anything you prefer, but it is better to start with liquids then soups and crackers, and gradually work up to solid foods.  Please notify your doctor immediately if you have any unusual bleeding, trouble breathing or pain that is not related to your normal pain.  Depending on the type of procedure that was done, some parts of your body may feel week and/or numb.  This usually clears up by tonight or the next day.  Walk with the use of an assistive device or accompanied by an adult for the 24 hours.  You may use ice on the affected area for the first 24 hours.  Put ice in a Ziploc bag and cover with a towel and place against area 15 minutes on 15 minutes off.  You may switch to heat after 24 hours.Epidural Steroid Injection Patient Information  Description: The epidural space surrounds the nerves as they exit the spinal cord.  In some patients, the nerves can be compressed and inflamed by a bulging disc or a tight spinal canal (spinal stenosis).  By injecting steroids into the epidural space, we can bring irritated nerves into direct contact with a potentially helpful medication.  These  steroids act directly on the irritated nerves and can reduce swelling and inflammation which often leads to decreased pain.  Epidural steroids may be injected anywhere along the spine and from the neck to the low back depending upon the location of your pain.   After numbing the skin with local anesthetic (like Novocaine), a small needle is passed into the epidural space slowly.  You may experience a sensation of pressure while this is being done.  The entire block usually last less than 10 minutes.  Conditions which may be treated by epidural steroids:   Low back and leg pain  Neck and arm pain  Spinal stenosis  Post-laminectomy syndrome  Herpes zoster (shingles) pain  Pain from compression fractures  Preparation for the injection:  1. Do not eat any solid food or dairy products within 8 hours of your appointment.  2. You may drink clear liquids up to 3 hours before appointment.  Clear liquids include water, black coffee, juice or soda.  No milk or cream please. 3. You may take your regular medication, including pain medications, with a sip of water before your appointment  Diabetics should hold regular insulin (if taken separately) and take 1/2 normal NPH dos the morning of the procedure.  Carry some sugar containing items with you to your appointment. 4. A driver must accompany you and be prepared to drive you home after your procedure.  5. Bring all your current medications with your. 6. An IV may be inserted and   sedation may be given at the discretion of the physician.   7. A blood pressure cuff, EKG and other monitors will often be applied during the procedure.  Some patients may need to have extra oxygen administered for a short period. 8. You will be asked to provide medical information, including your allergies, prior to the procedure.  We must know immediately if you are taking blood thinners (like Coumadin/Warfarin)  Or if you are allergic to IV iodine contrast (dye). We must  know if you could possible be pregnant.  Possible side-effects:  Bleeding from needle site  Infection (rare, may require surgery)  Nerve injury (rare)  Numbness & tingling (temporary)  Difficulty urinating (rare, temporary)  Spinal headache ( a headache worse with upright posture)  Light -headedness (temporary)  Pain at injection site (several days)  Decreased blood pressure (temporary)  Weakness in arm/leg (temporary)  Pressure sensation in back/neck (temporary)  Call if you experience:  Fever/chills associated with headache or increased back/neck pain.  Headache worsened by an upright position.  New onset weakness or numbness of an extremity below the injection site  Hives or difficulty breathing (go to the emergency room)  Inflammation or drainage at the infection site  Severe back/neck pain  Any new symptoms which are concerning to you  Please note:  Although the local anesthetic injected can often make your back or neck feel good for several hours after the injection, the pain will likely return.  It takes 3-7 days for steroids to work in the epidural space.  You may not notice any pain relief for at least that one week.  If effective, we will often do a series of three injections spaced 3-6 weeks apart to maximally decrease your pain.  After the initial series, we generally will wait several months before considering a repeat injection of the same type.  If you have any questions, please call [phone] [facility] 

## 2019-01-05 ENCOUNTER — Telehealth: Payer: Self-pay

## 2019-01-05 NOTE — Telephone Encounter (Signed)
Patient was called and message was left on answering service.

## 2019-01-13 ENCOUNTER — Telehealth: Payer: Self-pay | Admitting: *Deleted

## 2019-01-13 NOTE — Telephone Encounter (Signed)
Patient states she is having pain at the injection site and back of the legs. Patient advised that she may still be having pain from the injection itself. Advised to apply heat to the lower back. Denies any fever, leg weakness, or loss of bowel or bladder control. Will continue to monitor and report at follow-up.

## 2019-02-03 ENCOUNTER — Telehealth: Payer: Self-pay | Admitting: Student in an Organized Health Care Education/Training Program

## 2019-02-03 NOTE — Telephone Encounter (Signed)
Patient lvmail stating she is having bad pain bilaterly in her hips and upper thighs. She has f/u appt 8-24.  But wants to know what can she do to help pain

## 2019-02-03 NOTE — Telephone Encounter (Signed)
Patient states she has started hurting again. Her PCP put her on Trazodone for sleep. She states she can discuss with Dr. Holley Raring on her VV with Dr. Holley Raring Monday 08/24.

## 2019-02-04 ENCOUNTER — Other Ambulatory Visit: Payer: Self-pay

## 2019-02-04 ENCOUNTER — Encounter: Payer: Self-pay | Admitting: *Deleted

## 2019-02-04 ENCOUNTER — Emergency Department
Admission: EM | Admit: 2019-02-04 | Discharge: 2019-02-04 | Disposition: A | Discharge status: Home / Self Care | Payer: 59 | Attending: Emergency Medicine | Admitting: Emergency Medicine

## 2019-02-04 DIAGNOSIS — Z87891 Personal history of nicotine dependence: Secondary | ICD-10-CM | POA: Insufficient documentation

## 2019-02-04 DIAGNOSIS — E039 Hypothyroidism, unspecified: Secondary | ICD-10-CM | POA: Diagnosis not present

## 2019-02-04 DIAGNOSIS — I10 Essential (primary) hypertension: Secondary | ICD-10-CM | POA: Diagnosis not present

## 2019-02-04 DIAGNOSIS — Z79899 Other long term (current) drug therapy: Secondary | ICD-10-CM | POA: Diagnosis not present

## 2019-02-04 DIAGNOSIS — Z96652 Presence of left artificial knee joint: Secondary | ICD-10-CM | POA: Diagnosis not present

## 2019-02-04 DIAGNOSIS — G894 Chronic pain syndrome: Secondary | ICD-10-CM | POA: Diagnosis not present

## 2019-02-04 DIAGNOSIS — M79669 Pain in unspecified lower leg: Secondary | ICD-10-CM | POA: Diagnosis present

## 2019-02-04 MED ORDER — ONDANSETRON 4 MG PO TBDP: 4.0000 mg | ORAL_TABLET | Freq: Three times a day (TID) | ORAL | 0 refills | Status: DC | PRN

## 2019-02-04 MED ORDER — PREDNISONE 10 MG (21) PO TBPK: ORAL_TABLET | ORAL | 0 refills | Status: DC

## 2019-02-04 NOTE — ED Triage Notes (Signed)
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 States she had an epidural in July, however, it did not relieve her pain. Has a call with pain Dr on Monday, needing pain relief until then. Pt can walk, however it is painful. NAD.

## 2019-02-04 NOTE — ED Provider Notes (Signed)
[facility] Emergency Department Provider Note  ____________________________________________   First MD Initiated Contact with Patient 02/04/19 1348     (approximate)  I have reviewed the triage vital signs and the nursing notes.   HISTORY  Chief Complaint Leg Pain    HPI Erika Crawford is a 78 y.o. female presents emergency department complaining of bilateral knee, left shoulder and left lower back pain.  She states it is her chronic pain.  She states she tried to take her hydrocodone but it makes her nauseated.  She is just looking for pain control and something for inflammation.    Past Medical History:  Diagnosis Date  . Arthritis   . Complication of anesthesia    dizziness  . Hypertension   . Hypothyroidism   . Sleep apnea    cpap  . Thyroid disease     Patient Active Problem List   Diagnosis Date Noted  . 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 Chronic left-sided low back pain with left-sided sciatica 06/22/2018  . Vitamin B 12 deficiency 01/02/2018  . Chronic anemia 09/06/2017  . Chronic constipation 06/08/2017  . Lumbar radiculopathy 05/22/2017  . Spinal stenosis of lumbosacral region 03/19/2017  . 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 Acute bilateral low back pain without sciatica 02/04/2017  . Dysuria 12/13/2016  . Prediabetes 11/05/2016  . Acquired hypothyroidism 08/24/2016  . Myofascial pain syndrome 08/24/2016  . Essential hypertension 08/24/2016  . Osteopenia of multiple sites 08/24/2016  . Lumbar degenerative disc disease 08/24/2016  . Bilateral hydronephrosis 07/15/2016  . Rotator cuff tendinitis, left 06/21/2016  . 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 Rotator cuff tendinitis, right 11/27/2015  . 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  . Injury of tendon of long head of right biceps 11/27/2015  . Incomplete tear of  right rotator cuff 11/27/2015  . Obstructive sleep apnea on CPAP 07/13/2015  . Osteoarthritis 11/04/2013  . Disorder of uterus 04/29/1995    Past Surgical History:  Procedure Laterality Date  . ABDOMINAL HYSTERECTOMY    . BACK SURGERY     lower middle kyphoplasty  . COLONOSCOPY WITH PROPOFOL N/A 09/21/2015   Procedure: COLONOSCOPY WITH PROPOFOL;  Surgeon: Hulen Luster, MD;  Location: [facility] ENDOSCOPY;  Service: Gastroenterology;  Laterality: N/A;  . JOINT REPLACEMENT     03/19/2016- left knee replacement  . REVERSE SHOULDER ARTHROPLASTY Right 08/25/2018   Procedure: REVERSE SHOULDER ARTHROPLASTY, RIGHT;  Surgeon: Corky Mull, MD;  Location: [facility] ORS;  Service: Orthopedics;  Laterality: Right;  . 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   Procedure: TOTAL KNEE ARTHROPLASTY;  Surgeon: Corky Mull, MD;  Location: [facility] ORS;  Service: Orthopedics;  Laterality: Left;    Prior to Admission medications   Medication Sig Start Date End Date Taking? Authorizing Provider  Acetaminophen 325 MG CAPS Take 1,000 mg by mouth as needed.    [provider]  alendronate (FOSAMAX) 70 MG tablet Take 70 mg by mouth every Wednesday. Take with a full glass of water on an empty stomach.     [provider]  amLODipine (NORVASC) 5 MG tablet Take 5 mg by mouth daily.    [provider]  ASPERCREME LIDOCAINE EX Apply 1 application topically daily as needed (pain).    [provider]  cholecalciferol (VITAMIN D3) 25 MCG (1000 UT) tablet Take 2,000 Units by mouth daily.    [provider]  cyclobenzaprine (FLEXERIL) 10 MG tablet Take 1 tablet by mouth 3 (three) times daily as needed. 01/01/19   [provider]  gabapentin (NEURONTIN) 300 MG capsule 300 mg qday, 300 mg qPM, 600 mg qhs Patient not taking: Reported on 08/19/2018 07/14/18   [name], MD  Homeopathic Products ([place] EX) Apply 1 application topically daily as needed (pain).    [provider]  HYDROcodone-acetaminophen (NORCO/VICODIN) 5-325 MG tablet Take 1-2 tablets by mouth every 4 (four) hours as needed for moderate pain (pain score 4-6). 08/26/18   Duanne Guess, PA-C  levothyroxine (SYNTHROID, LEVOTHROID) 88 MCG tablet Take 88 mcg by mouth daily before breakfast.     [provider]  magnesium oxide (MAG-OX) 400 MG tablet Take 250 mg by mouth daily.    [provider]  Menthol, Topical Analgesic, (BIOFREEZE EX) Apply 1 application topically daily as needed (pain).    [provider]  Menthol, Topical Analgesic, (TWO OLD [place]ATS ARTHRITIS EX) Apply 1 application topically daily as needed (pain).    [provider]  mometasone (NASONEX) 50 MCG/ACT nasal spray Place 2 sprays into the nose daily as needed (allergies).     [provider]  Naphazoline-Pheniramine (OPCON-A OP) Place 1 drop into both eyes daily as needed (allergies).    [provider]  olmesartan (BENICAR) 40 MG tablet Take 40 mg by mouth daily. 06/24/17   [provider]  omeprazole (PRILOSEC) 20 MG capsule Take 20 mg by mouth daily.    [provider]  ondansetron (ZOFRAN-ODT) 4 MG disintegrating tablet Take 1 tablet (4 mg total) by mouth every 8 (eight) hours as needed. 02/04/19   Caryn Section Linden Dolin, PA-C  predniSONE (STERAPRED UNI-PAK 21 TAB) 10 MG (21) TBPK tablet Take 6 pills on day one then decrease by 1 pill each day 02/04/19   Versie Starks, PA-C    Allergies Celebrex [celecoxib], Codeine, Etodolac, Hydrochlorothiazide, Oxycodone, Pravastatin, Relafen [nabumetone], and Tramadol  Family History  Problem Relation Age of Onset  . Breast cancer Neg Hx     Social History Social History   Tobacco Use  . Smoking status: Former Research scientist (life sciences)  . Smokeless tobacco: Never Used  . Tobacco comment: quit 30 years ago  Substance Use Topics  . Alcohol use: Yes    Comment: very little  . Drug use: No    Review of Systems  Constitutional: No  fever/chills, chronic pain Eyes: No visual changes. ENT: No sore throat. Respiratory: Denies cough Genitourinary: Negative for dysuria. Musculoskeletal: Negative for back pain. Skin: Negative for rash.    ____________________________________________   PHYSICAL EXAM:  VITAL SIGNS: ED Triage Vitals  Enc Vitals Group     BP 02/04/19 1320 (!) 148/73     Pulse Rate 02/04/19 1320 88     Resp 02/04/19 1320 16     Temp 02/04/19 1320 98.6 F (37 C)     Temp Source 02/04/19 1320 Oral     SpO2 02/04/19 1320 97 %     Weight 02/04/19 1321 200 lb (90.7 kg)     Height 02/04/19 1321 5\' 7"  (1.702 m)     Head Circumference --      Peak Flow --      Pain Score 02/04/19 1321 9     Pain Loc --      Pain Edu? --      Excl. in [place]? --     Constitutional: Alert and oriented. Well appearing and in no acute  distress. Eyes: Conjunctivae are normal.  Head: Atraumatic. Nose: No congestion/rhinnorhea. Mouth/Throat: Mucous membranes are moist.   Neck:  supple no lymphadenopathy noted Cardiovascular: Normal rate, regular rhythm. Heart sounds are normal Respiratory: Normal respiratory effort.  No retractions, lungs c t a  GU: deferred Musculoskeletal: FROM all extremities, warm and well perfused, left shoulder and clavicle are mildly tender, lumbar spine is mildly tender, neurovascular is intact Neurologic:  Normal speech and language.  Skin:  Skin is warm, dry and intact. No rash noted. Psychiatric: Mood and affect are normal. Speech and behavior are normal.  ____________________________________________   LABS (all labs ordered are listed, but only abnormal results are displayed)  Labs Reviewed - No data to display ____________________________________________   ____________________________________________  RADIOLOGY    ____________________________________________   PROCEDURES  Procedure(s) performed: No  Procedures    ____________________________________________   INITIAL  IMPRESSION / ASSESSMENT AND PLAN / ED COURSE  Pertinent labs & imaging results that were available during my care of the patient were reviewed by me and considered in my medical decision making (see chart for details).   Patient 78 year old female presents emergency department chronic pain.  She is trying to use her hydrocodone but makes her nauseated.  Physical exam shows some tenderness at the left shoulder and clavicle and lumbar spine.  Explained findings to the patient.  We discussed pain control.  She states she would like some for inflammation.  Offered steroid pack and Zofran for the nausea.  She states she would agree with this plan.  She will use the Zofran if the steroid does not help with inflammation and she has to use her hydrocodone.  She has an appointment with her pain specialist.  She is to return if worsening.  States she understands will comply appears discharged stable condition.    ACHOL AZPEITIA was evaluated in Emergency Department on 02/04/2019 for the symptoms described in the history of present illness. She was evaluated in the context of the global COVID-19 pandemic, which necessitated consideration that the patient might be at risk for infection with the SARS-CoV-2 virus that causes COVID-19. Institutional protocols and algorithms that pertain to the evaluation of patients at risk for COVID-19 are in a state of rapid change based on information released by regulatory bodies including the CDC and federal and state organizations. These policies and algorithms were followed during the patient's care in the ED.   As part of my medical decision making, I reviewed the following data within the [place] notes reviewed and incorporated, Old chart reviewed, Notes from prior ED visits and [place] Controlled Substance Database  ____________________________________________   FINAL CLINICAL IMPRESSION(S) / ED DIAGNOSES  Final diagnoses:  Chronic pain  syndrome      NEW MEDICATIONS STARTED DURING THIS VISIT:  New Prescriptions   ONDANSETRON (ZOFRAN-ODT) 4 MG DISINTEGRATING TABLET    Take 1 tablet (4 mg total) by mouth every 8 (eight) hours as needed.   PREDNISONE (STERAPRED UNI-PAK 21 TAB) 10 MG (21) TBPK TABLET    Take 6 pills on day one then decrease by 1 pill each day     Note:  This document was prepared using Dragon voice recognition software and may include unintentional dictation errors.    Versie Starks, PA-C 02/04/19 1737    Nena Polio, MD 02/04/19 [phone]

## 2019-02-08 ENCOUNTER — Telehealth: Payer: Self-pay | Admitting: *Deleted

## 2019-02-08 ENCOUNTER — Ambulatory Visit
Payer: 59 | Attending: Student in an Organized Health Care Education/Training Program | Admitting: Student in an Organized Health Care Education/Training Program

## 2019-02-08 ENCOUNTER — Other Ambulatory Visit: Payer: Self-pay

## 2019-02-08 DIAGNOSIS — M5416 Radiculopathy, lumbar region: Secondary | ICD-10-CM

## 2019-02-08 NOTE — Progress Notes (Signed)
I attempted to call the patient however no response. Voicemail left instructing patient to call front desk office at [phone] to reschedule appointment. -Dr Meyli Boice  

## 2019-02-08 NOTE — Telephone Encounter (Signed)
Attempted to call for pre appointment assessment. Message left. 

## 2019-02-15 ENCOUNTER — Encounter: Payer: Self-pay | Admitting: Student in an Organized Health Care Education/Training Program

## 2019-02-16 ENCOUNTER — Ambulatory Visit
Payer: 59 | Attending: Student in an Organized Health Care Education/Training Program | Admitting: Student in an Organized Health Care Education/Training Program

## 2019-02-16 ENCOUNTER — Other Ambulatory Visit: Payer: Self-pay

## 2019-02-16 ENCOUNTER — Encounter: Payer: Self-pay | Admitting: Student in an Organized Health Care Education/Training Program

## 2019-02-16 DIAGNOSIS — M19011 Primary osteoarthritis, right shoulder: Secondary | ICD-10-CM | POA: Diagnosis not present

## 2019-02-16 DIAGNOSIS — M5136 Other intervertebral disc degeneration, lumbar region: Secondary | ICD-10-CM

## 2019-02-16 DIAGNOSIS — M5416 Radiculopathy, lumbar region: Secondary | ICD-10-CM

## 2019-02-16 DIAGNOSIS — G894 Chronic pain syndrome: Secondary | ICD-10-CM

## 2019-02-16 DIAGNOSIS — M48061 Spinal stenosis, lumbar region without neurogenic claudication: Secondary | ICD-10-CM

## 2019-02-16 DIAGNOSIS — M533 Sacrococcygeal disorders, not elsewhere classified: Secondary | ICD-10-CM | POA: Diagnosis not present

## 2019-02-16 DIAGNOSIS — M461 Sacroiliitis, not elsewhere classified: Secondary | ICD-10-CM

## 2019-02-16 DIAGNOSIS — M47818 Spondylosis without myelopathy or radiculopathy, sacral and sacrococcygeal region: Secondary | ICD-10-CM | POA: Diagnosis not present

## 2019-02-16 DIAGNOSIS — S46101S Unspecified injury of muscle, fascia and tendon of long head of biceps, right arm, sequela: Secondary | ICD-10-CM

## 2019-02-16 DIAGNOSIS — M7581 Other shoulder lesions, right shoulder: Secondary | ICD-10-CM

## 2019-02-16 DIAGNOSIS — M51369 Other intervertebral disc degeneration, lumbar region without mention of lumbar back pain or lower extremity pain: Secondary | ICD-10-CM

## 2019-02-16 NOTE — Progress Notes (Signed)
Pain Management Virtual Encounter Note - Virtual Visit via Telephone Telehealth (real-time audio visits between healthcare provider and patient).   Patient's Phone No. & Preferred Pharmacy:  [phone] (home); There is no such number on file (mobile).; (Preferred) [phone] No e-mail address on record  [address] [place] [address] Phone: [phone] Fax: [phone]    Pre-screening note:  Our staff contacted Erika Crawford and offered her an "in person", "face-to-face" appointment versus a telephone encounter. She indicated preferring the telephone encounter, at this time.   Reason for Virtual Visit: COVID-19*  Social distancing based on CDC and AMA recommendations.   I contacted Erika Crawford on 02/16/2019 via telephone.      I clearly identified myself as [name], MD. I verified that I was speaking with the correct person using two identifiers (Name: Erika Crawford, and date of birth: Sep 26, 1940).  Advanced Informed Consent I sought verbal advanced consent from Erika Crawford for virtual visit interactions. I informed Erika Crawford of possible security and privacy concerns, risks, and limitations associated with providing "not-in-person" medical evaluation and management services. I also informed Erika Crawford of the availability of "in-person" appointments. Finally, I informed her that there would be a charge for the virtual visit and that she could be  personally, fully or partially, financially responsible for it. Erika Crawford expressed understanding and agreed to proceed.   Historic Elements   Erika Crawford is a 78 y.o. year old, female patient evaluated today after her last encounter by our practice on 02/08/2019. Erika Crawford  has a past medical history of Arthritis, Complication of anesthesia, Hypertension, Hypothyroidism, Sleep apnea, and Thyroid disease. She also  has  a past surgical history that includes Abdominal hysterectomy; Colonoscopy with propofol (N/A, 09/21/2015); Back surgery; Total knee arthroplasty (Left, 03/21/2016); Joint replacement; and Reverse shoulder arthroplasty (Right, 08/25/2018). Erika Crawford has a current medication list which includes the following prescription(s): acetaminophen, alendronate, amlodipine, lidocaine, cholecalciferol, cyclobenzaprine, homeopathic products, levothyroxine, magnesium, menthol (topical analgesic), menthol (topical analgesic), mometasone, naphazoline-pheniramine, olmesartan, omeprazole, ondansetron, gabapentin, hydrocodone-acetaminophen, magnesium oxide, prednisone, and trazodone. She  reports that she has quit smoking. She has never used smokeless tobacco. She reports current alcohol use. She reports that she does not use drugs. Erika Crawford is allergic to celebrex [celecoxib]; codeine; etodolac; hydrochlorothiazide; oxycodone; pravastatin; relafen [nabumetone]; and tramadol.   HPI  Today, she is being contacted for a post-procedure assessment.  Unfortunately, the patient did not obtain as great of pain relief with her previous lumbar epidural steroid injection performed on 01/04/2019 which she has done in the past.  Patient's low back and hip pain would be responsive to every 3-4 month epidurals however this last 1 was not very effective.  She is endorsing axial low back pain, bilateral hip pain which does radiate into her posterior lateral thigh but does not radiate past her bilateral knees.  She describes pain deep in her buttock area and does have occasional pain in her groin.  We discussed diagnostic SI joint injections.  Risks and benefits were reviewed and patient would like to proceed.  Future considerations in terms of medication management could include tramadol, buprenorphine.  UDS:  Summary  Date Value Ref Range Status  07/30/2018 FINAL  Final    Comment:     ==================================================================== TOXASSURE COMP DRUG ANALYSIS,UR ==================================================================== Test  Result       Flag       Units Drug Present and Declared for Prescription Verification   Gabapentin                     PRESENT      EXPECTED   Acetaminophen                  PRESENT      EXPECTED   Naproxen                       PRESENT      EXPECTED   Lidocaine                      PRESENT      EXPECTED Drug Absent but Declared for Prescription Verification   Hydrocodone                    Not Detected UNEXPECTED ng/mg creat ==================================================================== Test                      Result    Flag   Units      Ref Range   Creatinine              272              mg/dL      >=20 ==================================================================== Declared Medications:  The flagging and interpretation on this report are based on the  following declared medications.  Unexpected results may arise from  inaccuracies in the declared medications.  **Note: The testing scope of this panel includes these medications:  Gabapentin  Hydrocodone (Hydrocodone-Acetaminophen)  Naproxen  **Note: The testing scope of this panel does not include small to  moderate amounts of these reported medications:  Acetaminophen  Acetaminophen (Hydrocodone-Acetaminophen)  Lidocaine  **Note: The testing scope of this panel does not include following  reported medications:  Alendronate (Fosamax)  Amlodipine (Norvasc)  Cholecalciferol  Levothyroxine  Mometasone  Olmesartan (Benicar)  Omeprazole (Prilosec) ==================================================================== For clinical consultation, please call [phone]. ====================================================================    Laboratory Chemistry Profile (12 mo)  Renal: 08/26/2018: BUN 9; Creatinine,  Ser 0.50  Lab Results  Component Value Date   GFRAA >60 08/26/2018   GFRNONAA >60 08/26/2018   Hepatic: No results found for requested labs within last 8760 hours. Lab Results  Component Value Date   AST 16 01/19/2014   ALT 25 01/19/2014   Other: No results found for requested labs within last 8760 hours. Note: Above Lab results reviewed.  Imaging  Last 90 days:  Dg Pain Clinic C-arm 1-60 Min No Report  Result Date: 01/04/2019 Fluoro was used, but no Radiologist interpretation will be provided. Please refer to "NOTES" tab for provider progress note.   Assessment  The primary encounter diagnosis was Sacroiliac joint pain. Diagnoses of SI joint arthritis, Lumbar radiculopathy, Primary osteoarthritis of right shoulder, Lumbar degenerative disc disease, Injury of tendon of long head of right biceps, sequela, Rotator cuff tendinitis, right, Lumbar foraminal stenosis, and Chronic pain syndrome were also pertinent to this visit.  Plan of Care  I am having Erika Crawford maintain her alendronate, levothyroxine, mometasone, omeprazole, olmesartan, cholecalciferol, amLODipine, ASPERCREME LIDOCAINE EX, Homeopathic Products (THER[place]ORX RELIEF EX), gabapentin, (Menthol, Topical Analgesic, (BIOFREEZE EX)), (Menthol, Topical Analgesic, (TWO [place]LD [place]ATS ARTHRITIS EX)), Naphazoline-Pheniramine (OPCON-A OP), HYDROcodone-acetaminophen, cyclobenzaprine, Acetaminophen, magnesium oxide, predniSONE, ondansetron, Magnesium, and traZODone.  Pharmacotherapy (  Medications Ordered): No orders of the defined types were placed in this encounter.  Orders:  Orders Placed This Encounter  Procedures  . SACROILIAC JOINT INJECTION    Standing Status:   Future    Standing Expiration Date:   03/18/2019    Scheduling Instructions:     Side: Bilateral     Sedation: without     Timeframe: ASAP    Order Specific Question:   Where will this procedure be performed?    Answer:   [facility]   Follow-up plan:    Return in about 2 weeks (around 03/02/2019) for Procedure B/l SI-J w.o sed.     Previous lumbar epidurals have not very helpful in managing her lumbar radicular pain every 3 to every 4 months however previous one done on 01/04/2019 was not very helpful consider SI joint pathology and consider diagnostic SI joint injection.   Recent Visits Date Type Provider Dept  02/08/19 Office Visit [name], MD [facility]  01/04/19 Procedure visit [name], MD [facility]  Showing recent visits within past 90 days and meeting all other requirements   Today's Visits Date Type Provider Dept  02/16/19 Office Visit [name], MD [facility]  Showing today's visits and meeting all other requirements   Future Appointments No visits were found meeting these conditions.  Showing future appointments within next 90 days and meeting all other requirements   I discussed the assessment and treatment plan with the patient. The patient was provided an opportunity to ask questions and all were answered. The patient agreed with the plan and demonstrated an understanding of the instructions.  Patient advised to call back or seek an in-person evaluation if the symptoms or condition worsens.  Total duration of non-face-to-face encounter: 75minutes.  Note by: [name], MD Date: 02/16/2019; Time: 12:11 PM  Note: This dictation was prepared with Dragon dictation. Any transcriptional errors that may result from this process are unintentional.  Disclaimer:  * Given the special circumstances of the COVID-19 pandemic, the federal government has announced that the Office for Civil Rights (OCR) will exercise its enforcement discretion and will not impose penalties on physicians using telehealth in the event of noncompliance with regulatory requirements under the [place] (HIPAA) in connection with the good faith provision of telehealth  during the XX123456 national public health emergency. ([place])

## 2019-03-08 ENCOUNTER — Ambulatory Visit: Payer: 59 | Admitting: Student in an Organized Health Care Education/Training Program

## 2019-03-31 ENCOUNTER — Ambulatory Visit: Payer: 59 | Admitting: Student in an Organized Health Care Education/Training Program

## 2019-04-05 ENCOUNTER — Encounter: Payer: Self-pay | Admitting: Student in an Organized Health Care Education/Training Program

## 2019-04-05 ENCOUNTER — Other Ambulatory Visit: Payer: Self-pay

## 2019-04-05 ENCOUNTER — Ambulatory Visit (HOSPITAL_BASED_OUTPATIENT_CLINIC_OR_DEPARTMENT_OTHER): Payer: 59 | Admitting: Student in an Organized Health Care Education/Training Program

## 2019-04-05 ENCOUNTER — Ambulatory Visit
Admission: RE | Admit: 2019-04-05 | Discharge: 2019-04-05 | Disposition: A | Discharge status: Home / Self Care | Payer: 59 | Source: Ambulatory Visit | Attending: Student in an Organized Health Care Education/Training Program | Admitting: Student in an Organized Health Care Education/Training Program

## 2019-04-05 VITALS — BP 146/80 | HR 99 | Temp 98.3°F | Resp 16 | Ht 67.0 in | Wt 206.0 lb

## 2019-04-05 DIAGNOSIS — M47818 Spondylosis without myelopathy or radiculopathy, sacral and sacrococcygeal region: Secondary | ICD-10-CM | POA: Diagnosis not present

## 2019-04-05 DIAGNOSIS — Z7952 Long term (current) use of systemic steroids: Secondary | ICD-10-CM | POA: Diagnosis not present

## 2019-04-05 DIAGNOSIS — Z79899 Other long term (current) drug therapy: Secondary | ICD-10-CM | POA: Diagnosis not present

## 2019-04-05 DIAGNOSIS — Z96652 Presence of left artificial knee joint: Secondary | ICD-10-CM | POA: Insufficient documentation

## 2019-04-05 DIAGNOSIS — M533 Sacrococcygeal disorders, not elsewhere classified: Secondary | ICD-10-CM | POA: Insufficient documentation

## 2019-04-05 DIAGNOSIS — Z79890 Hormone replacement therapy (postmenopausal): Secondary | ICD-10-CM | POA: Insufficient documentation

## 2019-04-05 DIAGNOSIS — Z885 Allergy status to narcotic agent status: Secondary | ICD-10-CM | POA: Insufficient documentation

## 2019-04-05 DIAGNOSIS — Z886 Allergy status to analgesic agent status: Secondary | ICD-10-CM | POA: Insufficient documentation

## 2019-04-05 DIAGNOSIS — Z96611 Presence of right artificial shoulder joint: Secondary | ICD-10-CM | POA: Insufficient documentation

## 2019-04-05 DIAGNOSIS — Z888 Allergy status to other drugs, medicaments and biological substances status: Secondary | ICD-10-CM | POA: Diagnosis not present

## 2019-04-05 MED ORDER — LIDOCAINE HCL 2 % IJ SOLN
20.0000 mL | Freq: Once | INTRAMUSCULAR | Status: AC
  Administered 2019-04-05: 400 mg
  Filled 2019-04-05: qty 20

## 2019-04-05 MED ORDER — DEXAMETHASONE SODIUM PHOSPHATE 10 MG/ML IJ SOLN
10.0000 mg | Freq: Once | INTRAMUSCULAR | Status: AC
  Administered 2019-04-05: 10 mg
  Filled 2019-04-05: qty 1

## 2019-04-05 MED ORDER — ROPIVACAINE HCL 2 MG/ML IJ SOLN
1.0000 mL | Freq: Once | INTRAMUSCULAR | Status: AC
  Administered 2019-04-05: 1 mL via EPIDURAL
  Filled 2019-04-05: qty 10

## 2019-04-05 MED ORDER — IOHEXOL 180 MG/ML  SOLN
10.0000 mL | Freq: Once | INTRAMUSCULAR | Status: AC
  Administered 2019-04-05: 1 mL via EPIDURAL
  Filled 2019-04-05: qty 20

## 2019-04-05 NOTE — Progress Notes (Signed)
Safety precautions to be maintained throughout the outpatient stay will include: orient to surroundings, keep bed in low position, maintain call bell within reach at all times, provide assistance with transfer out of bed and ambulation.  

## 2019-04-05 NOTE — Progress Notes (Signed)
Patient's Name: Erika Crawford  MRN: [id number]  Referring Provider: Baxter Hire, MD  DOB: [birth year]  PCP: Baxter Hire, MD  DOS: 04/05/2019  Note by: [name], MD  Service setting: Ambulatory outpatient  Specialty: Interventional Pain Management  Patient type: Established  Location: [facility] (AMB) Pain Management Facility  Visit type: Interventional Procedure   Primary Reason for Visit: Interventional Pain Management Treatment. CC: Back Pain (left lower)  Procedure:          Anesthesia, Analgesia, Anxiolysis:  Type: Diagnostic Sacroiliac Joint Steroid Injection #1  Region: Inferior Lumbosacral Region Level: PIIS (Posterior Inferior Iliac Spine) Laterality: Bilateral  Type: Local Anesthesia    Indications: 1. SI joint arthritis   2. Sacroiliac joint pain    Pain Score: Pre-procedure: 9 /10 Post-procedure: 9 /10   Pre-op Assessment:  Erika Crawford is a 78 y.o. (year old), female patient, seen today for interventional treatment. 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 Erika Crawford has a current medication list which includes the following prescription(s): acetaminophen, alendronate, amlodipine, lidocaine, cholecalciferol, cyclobenzaprine, homeopathic products, hydrocodone-acetaminophen, levothyroxine, magnesium, magnesium oxide, menthol (topical analgesic), menthol (topical analgesic), mometasone, naphazoline-pheniramine, olmesartan, omeprazole, ondansetron, gabapentin, prednisone, and trazodone. Her primarily concern today is the Back Pain (left lower)  Initial Vital Signs:  Pulse/HCG Rate: 86  Temp: 98.3 F (36.8 C) Resp: 16 BP: 136/79 SpO2: 100 %  BMI: Estimated body mass index is 32.26 kg/m as calculated from the following:   Height as of this encounter: 5\' 7"  (1.702  m).   Weight as of this encounter: 206 lb (93.4 kg).  Risk Assessment: Allergies: Reviewed. She is allergic to celebrex [celecoxib]; codeine; etodolac; hydrochlorothiazide; oxycodone; pravastatin; relafen [nabumetone]; and tramadol.  Allergy Precautions: None required Coagulopathies: Reviewed. None identified.  Blood-thinner therapy: None at this time Active Infection(s): Reviewed. None identified. Erika Crawford is afebrile  Site Confirmation: Erika Crawford was asked to confirm the procedure and laterality before marking the site Procedure checklist: Completed Consent: Before the procedure and under the influence of no sedative(s), amnesic(s), or anxiolytics, the patient was informed of the treatment options, risks and possible complications. To fulfill our ethical and legal obligations, as recommended by the American Medical Association's Code of Ethics, I have informed the patient of my clinical impression; the nature and purpose of the treatment or procedure; the risks, benefits, and possible complications of the intervention; the alternatives, including doing nothing; the risk(s) and benefit(s) of the alternative treatment(s) or procedure(s); and the risk(s) and benefit(s) of doing nothing. The patient was provided information about the general risks and possible complications associated with the procedure. These may include, but are not limited to: failure to achieve desired goals, infection, bleeding, organ or nerve damage, allergic reactions, paralysis, and death. In addition, the patient was informed of those risks and complications associated to the procedure, such as failure to decrease pain; infection; bleeding; organ or nerve damage with subsequent damage to sensory, motor, and/or autonomic systems, resulting in permanent pain, numbness, and/or weakness of one or several areas of the body; allergic reactions; (i.e.: anaphylactic reaction); and/or death. Furthermore, the patient was informed of  those risks and complications associated with the medications. These include, but are not limited to: allergic reactions (i.e.: anaphylactic or anaphylactoid reaction(s)); adrenal axis suppression; blood sugar elevation that in diabetics may result in ketoacidosis or comma; water retention that  in patients with history of congestive heart failure may result in shortness of breath, pulmonary edema, and decompensation with resultant heart failure; weight gain; swelling or edema; medication-induced neural toxicity; particulate matter embolism and blood vessel occlusion with resultant organ, and/or nervous system infarction; and/or aseptic necrosis of one or more joints. Finally, the patient was informed that Medicine is not an exact science; therefore, there is also the possibility of unforeseen or unpredictable risks and/or possible complications that may result in a catastrophic outcome. The patient indicated having understood very clearly. We have given the patient no guarantees and we have made no promises. Enough time was given to the patient to ask questions, all of which were answered to the patient's satisfaction. Erika Crawford has indicated that she wanted to continue with the procedure. Attestation: I, the ordering provider, attest that I have discussed with the patient the benefits, risks, side-effects, alternatives, likelihood of achieving goals, and potential problems during recovery for the procedure that I have provided informed consent. Date  Time: 04/05/2019  9:41 AM  Pre-Procedure Preparation:  Monitoring: As per clinic protocol. Respiration, ETCO2, SpO2, BP, heart rate and rhythm monitor placed and checked for adequate function Safety Precautions: Patient was assessed for positional comfort and pressure points before starting the procedure. Time-out: I initiated and conducted the "Time-out" before starting the procedure, as per protocol. The patient was asked to participate by confirming the  accuracy of the "Time Out" information. Verification of the correct person, site, and procedure were performed and confirmed by me, the nursing staff, and the patient. "Time-out" conducted as per Joint Commission's Universal Protocol (UP.01.01.01). Time: 1024  Description of Procedure:          Target Area: Inferior, posterior, aspect of the sacroiliac fissure Approach: Posterior, paraspinal, ipsilateral approach. Area Prepped: Entire Lower Lumbosacral Region Prepping solution: DuraPrep (Iodine Povacrylex [0.7% available iodine] and Isopropyl Alcohol, 74% w/w) Safety Precautions: Aspiration looking for blood return was conducted prior to all injections. At no point did we inject any substances, as a needle was being advanced. No attempts were made at seeking any paresthesias. Safe injection practices and needle disposal techniques used. Medications properly checked for expiration dates. SDV (single dose vial) medications used. Description of the Procedure: Protocol guidelines were followed. The patient was placed in position over the procedure table. The target area was identified and the area prepped in the usual manner. Skin & deeper tissues infiltrated with local anesthetic. Appropriate amount of time allowed to pass for local anesthetics to take effect. The procedure needle was advanced under fluoroscopic guidance into the sacroiliac joint until a firm endpoint was obtained. Proper needle placement secured. Negative aspiration confirmed. Solution injected in intermittent fashion, asking for systemic symptoms every 0.5cc of injectate. The needles were then removed and the area cleansed, making sure to leave some of the prepping solution back to take advantage of its long term bactericidal properties. Vitals:   04/05/19 1020 04/05/19 1025 04/05/19 1030 04/05/19 1035  BP: (!) 160/76 (!) 143/80 (!) 141/82 (!) 146/80  Pulse: 94 97 98 99  Resp: 14 16 15 16   Temp:      TempSrc:      SpO2: 99% 99% 99%  98%  Weight:      Height:        Start Time: 1024 hrs. End Time: 1032 hrs. Materials:  Needle(s) Type: Spinal Needle Gauge: 22G Length: 3.5-in Medication(s): Please see orders for medications and dosing details. 5 cc solution made of 4 cc of  0.2% ropivacaine, 1 cc of Decadron 10 mg/cc.  2.5 cc injected intra-articular, 2.5 cc injected periarticular for left SI joint 5 cc solution made of 4 cc of 0.2% ropivacaine, 1 cc of Decadron 10 mg/cc.  2.5 cc injected intra-articular, 2.5 cc injected periarticular for right SI joint  Imaging Guidance (Non-Spinal):          Type of Imaging Technique: Fluoroscopy Guidance (Non-Spinal) Indication(s): Assistance in needle guidance and placement for procedures requiring needle placement in or near specific anatomical locations not easily accessible without such assistance. Exposure Time: Please see nurses notes. Contrast: Before injecting any contrast, we confirmed that the patient did not have an allergy to iodine, shellfish, or radiological contrast. Once satisfactory needle placement was completed at the desired level, radiological contrast was injected. Contrast injected under live fluoroscopy. No contrast complications. See chart for type and volume of contrast used. Fluoroscopic Guidance: I was personally present during the use of fluoroscopy. "Tunnel Vision Technique" used to obtain the best possible view of the target area. Parallax error corrected before commencing the procedure. "Direction-depth-direction" technique used to introduce the needle under continuous pulsed fluoroscopy. Once target was reached, antero-posterior, oblique, and lateral fluoroscopic projection used confirm needle placement in all planes. Images permanently stored in EMR. Interpretation: I personally interpreted the imaging intraoperatively. Adequate needle placement confirmed in multiple planes. Appropriate spread of contrast into desired area was observed. No evidence of  afferent or efferent intravascular uptake. Permanent images saved into the patient's record.   Post-operative Assessment:  Post-procedure Vital Signs:  Pulse/HCG Rate: 99  Temp: 98.3 F (36.8 C) Resp: 16 BP: (!) 146/80 SpO2: 98 %  EBL: None  Complications: No immediate post-treatment complications observed by team, or reported by patient.  Note: The patient tolerated the entire procedure well. A repeat set of vitals were taken after the procedure and the patient was kept under observation following institutional policy, for this type of procedure. Post-procedural neurological assessment was performed, showing return to baseline, prior to discharge. The patient was provided with post-procedure discharge instructions, including a section on how to identify potential problems. Should any problems arise concerning this procedure, the patient was given instructions to immediately contact us, at any time, without hesitation. In any case, we plan to contact the patient by telephone for a follow-up status report regarding this interventional procedure.  Comments:  No additional relevant information.  Plan of Care  Orders:  Orders Placed This Encounter  Procedures  . DG PA[facility] C-ARM 1-60 MIN NO REPORT    Intraoperative interpretation by procedural physician at [place].    Standing Status:   Standing    Number of Occurrences:   1    Order Specific Question:   Reason for exam:    Answer:   Assistance in needle guidance and placement for procedures requiring needle placement in or near specific anatomical locations not easily accessible without such assistance.    Medications ordered for procedure: Meds ordered this encounter  Medications  . iohexol (OMNIPAQUE) 180 MG/ML injection 10 mL    Must be Myelogram-compatible. If not available, you may substitute with a water-soluble, non-ionic, hypoallergenic, myelogram-compatible radiological contrast medium.  Marland Kitchen lidocaine  (XYLOCAINE) 2 % (with pres) injection 400 mg  . dexamethasone (DECADRON) injection 10 mg  . ropivacaine (PF) 2 mg/mL (0.2%) (NAROPIN) injection 1 mL   Medications administered: We administered iohexol, lidocaine, dexamethasone, and ropivacaine (PF) 2 mg/mL (0.2%).  See the medical record for exact dosing, route, and time  of administration.  Follow-up plan:   Return in about 4 weeks (around 05/03/2019) for Post Procedure Evaluation, virtual.      Previous lumbar epidurals have been very helpful in managing her lumbar radicular pain every 3 to every 4 months however previous one done on 01/04/2019, buttock and low back pain status post SI joint injection 04/05/2019.    Recent Visits Date Type Provider Dept  02/16/19 Office Visit [name], MD [facility]  02/08/19 Office Visit [name], MD [facility]  Showing recent visits within past 90 days and meeting all other requirements   Today's Visits Date Type Provider Dept  04/05/19 Procedure visit [name], MD [facility]  Showing today's visits and meeting all other requirements   Future Appointments Date Type Provider Dept  05/03/19 Appointment [name], MD [facility]  Showing future appointments within next 90 days and meeting all other requirements   Disposition: Discharge home  Discharge Date & Time: 04/05/2019; 1038 hrs.   Primary Care Physician: Baxter Hire, MD Location: [facility] Note by: [name], MD Date: 04/05/2019; Time: 10:41 AM  Disclaimer:  Medicine is not an exact science. The only guarantee in medicine is that nothing is guaranteed. It is important to note that the decision to proceed with this intervention was based on the information collected from the patient. The Data and conclusions were drawn from the patient's questionnaire, the interview, and the physical examination. Because the information was provided in large  part by the patient, it cannot be guaranteed that it has not been purposely or unconsciously manipulated. Every effort has been made to obtain as much relevant data as possible for this evaluation. It is important to note that the conclusions that lead to this procedure are derived in large part from the available data. Always take into account that the treatment will also be dependent on availability of resources and existing treatment guidelines, considered by other Pain Management Practitioners as being common knowledge and practice, at the time of the intervention. For Medico-Legal purposes, it is also important to point out that variation in procedural techniques and pharmacological choices are the acceptable norm. The indications, contraindications, technique, and results of the above procedure should only be interpreted and judged by a Board-Certified Interventional Pain Specialist with extensive familiarity and expertise in the same exact procedure and technique.

## 2019-04-05 NOTE — Patient Instructions (Signed)
____________________________________________________________________________________________  Post-Procedure Discharge Instructions  Instructions:  Apply ice:   Purpose: This will minimize any swelling and discomfort after procedure.   When: Day of procedure, as soon as you get home.  How: Fill a plastic sandwich bag with crushed ice. Cover it with a small towel and apply to injection site.  How long: (15 min on, 15 min off) Apply for 15 minutes then remove x 15 minutes.  Repeat sequence on day of procedure, until you go to bed.  Apply heat:   Purpose: To treat any soreness and discomfort from the procedure.  When: Starting the next day after the procedure.  How: Apply heat to procedure site starting the day following the procedure.  How long: May continue to repeat daily, until discomfort goes away.  Food intake: Start with clear liquids (like water) and advance to regular food, as tolerated.   Physical activities: Keep activities to a minimum for the first 8 hours after the procedure. After that, then as tolerated.  Driving: If you have received any sedation, be responsible and do not drive. You are not allowed to drive for 24 hours after having sedation.  Blood thinner: (Applies only to those taking blood thinners) You may restart your blood thinner 6 hours after your procedure.  Insulin: (Applies only to Diabetic patients taking insulin) As soon as you can eat, you may resume your normal dosing schedule.  Infection prevention: Keep procedure site clean and dry. Shower daily and clean area with soap and water.  Post-procedure Pain Diary: Extremely important that this be done correctly and accurately. Recorded information will be used to determine the next step in treatment. For the purpose of accuracy, follow these rules:  Evaluate only the area treated. Do not report or include pain from an untreated area. For the purpose of this evaluation, ignore all other areas of pain,  except for the treated area.  After your procedure, avoid taking a long nap and attempting to complete the pain diary after you wake up. Instead, set your alarm clock to go off every hour, on the hour, for the initial 8 hours after the procedure. Document the duration of the numbing medicine, and the relief you are getting from it.  Do not go to sleep and attempt to complete it later. It will not be accurate. If you received sedation, it is likely that you were given a medication that may cause amnesia. Because of this, completing the diary at a later time may cause the information to be inaccurate. This information is needed to plan your care.  Follow-up appointment: Keep your post-procedure follow-up evaluation appointment after the procedure (usually 2 weeks for most procedures, 6 weeks for radiofrequencies). DO NOT FORGET to bring you pain diary with you.   Expect: (What should I expect to see with my procedure?)  From numbing medicine (AKA: Local Anesthetics): Numbness or decrease in pain. You may also experience some weakness, which if present, could last for the duration of the local anesthetic.  Onset: Full effect within 15 minutes of injected.  Duration: It will depend on the type of local anesthetic used. On the average, 1 to 8 hours.   From steroids (Applies only if steroids were used): Decrease in swelling or inflammation. Once inflammation is improved, relief of the pain will follow.  Onset of benefits: Depends on the amount of swelling present. The more swelling, the longer it will take for the benefits to be seen. In some cases, up to 10 days.    Duration: Steroids will stay in the system x 2 weeks. Duration of benefits will depend on multiple posibilities including persistent irritating factors.  Side-effects: If present, they may typically last 2 weeks (the duration of the steroids).  Frequent: Cramps (if they occur, drink Gatorade and take over-the-counter Magnesium 450-500 mg  once to twice a day); water retention with temporary weight gain; increases in blood sugar; decreased immune system response; increased appetite.  Occasional: Facial flushing (red, warm cheeks); mood swings; menstrual changes.  Uncommon: Long-term decrease or suppression of natural hormones; bone thinning. (These are more common with higher doses or more frequent use. This is why we prefer that our patients avoid having any injection therapies in other practices.)   Very Rare: Severe mood changes; psychosis; aseptic necrosis.  From procedure: Some discomfort is to be expected once the numbing medicine wears off. This should be minimal if ice and heat are applied as instructed.  Call if: (When should I call?)  You experience numbness and weakness that gets worse with time, as opposed to wearing off.  New onset bowel or bladder incontinence. (Applies only to procedures done in the spine)  Emergency Numbers:  Durning business hours (Monday - Thursday, 8:00 AM - 4:00 PM) (Friday, 9:00 AM - 12:00 Noon): [phone]  After hours: [phone]  NOTE: If you are having a problem and are unable connect with, or to talk to a provider, then go to your nearest urgent care or emergency department. If the problem is serious and urgent, please call 911. ____________________________________________________________________________________________  Sacroiliac (SI) Joint Injection Patient Information  Description: The sacroiliac joint connects the scrum (very low back and tailbone) to the ilium (a pelvic bone which also forms half of the hip joint).  Normally this joint experiences very little motion.  When this joint becomes inflamed or unstable low back and or hip and pelvis pain may result.  Injection of this joint with local anesthetics (numbing medicines) and steroids can provide diagnostic information and reduce pain.  This injection is performed with the aid of x-ray guidance into the tailbone  area while you are lying on your stomach.   You may experience an electrical sensation down the leg while this is being done.  You may also experience numbness.  We also may ask if we are reproducing your normal pain during the injection.  Conditions which may be treated SI injection:   Low back, buttock, hip or leg pain  Preparation for the Injection:  1. Do not eat any solid food or dairy products within 8 hours of your appointment.  2. You may drink clear liquids up to 3 hours before appointment.  Clear liquids include water, black coffee, juice or soda.  No milk or cream please. 3. You may take your regular medications, including pain medications with a sip of water before your appointment.  Diabetics should hold regular insulin (if take separately) and take 1/2 normal NPH dose the morning of the procedure.  Carry some sugar containing items with you to your appointment. 4. A driver must accompany you and be prepared to drive you home after your procedure. 5. Bring all of your current medications with you. 6. An IV may be inserted and sedation may be given at the discretion of the physician. 7. A blood pressure cuff, EKG and other monitors will often be applied during the procedure.  Some patients may need to have extra oxygen administered for a short period.  8. You will be asked to   provide medical information, including your allergies, prior to the procedure.  We must know immediately if you are taking blood thinners (like Coumadin/Warfarin) or if you are allergic to IV iodine contrast (dye).  We must know if you could possible be pregnant.  Possible side effects:   Bleeding from needle site  Infection (rare, may require surgery)  Nerve injury (rare)  Numbness & tingling (temporary)  A brief convulsion or seizure  Light-headedness (temporary)  Pain at injection site (several days)  Decreased blood pressure (temporary)  Weakness in the leg (temporary)   Call if you  experience:   New onset weakness or numbness of an extremity below the injection site that last more than 8 hours.  Hives or difficulty breathing ( go to the emergency room)  Inflammation or drainage at the injection site  Any new symptoms which are concerning to you  Please note:  Although the local anesthetic injected can often make your back/ hip/ buttock/ leg feel good for several hours after the injections, the pain will likely return.  It takes 3-7 days for steroids to work in the sacroiliac area.  You may not notice any pain relief for at least that one week.  If effective, we will often do a series of three injections spaced 3-6 weeks apart to maximally decrease your pain.  After the initial series, we generally will wait some months before a repeat injection of the same type.  If you have any questions, please call [phone] [facility]   

## 2019-04-06 ENCOUNTER — Telehealth: Payer: Self-pay

## 2019-04-06 NOTE — Telephone Encounter (Signed)
Post procedure phone call.  Patient states she is doing great 

## 2019-04-06 NOTE — Telephone Encounter (Signed)
D

## 2019-05-03 ENCOUNTER — Encounter: Payer: Self-pay | Admitting: Student in an Organized Health Care Education/Training Program

## 2019-05-03 ENCOUNTER — Other Ambulatory Visit: Payer: Self-pay

## 2019-05-03 ENCOUNTER — Ambulatory Visit
Payer: 59 | Attending: Student in an Organized Health Care Education/Training Program | Admitting: Student in an Organized Health Care Education/Training Program

## 2019-05-03 DIAGNOSIS — M5136 Other intervertebral disc degeneration, lumbar region: Secondary | ICD-10-CM

## 2019-05-03 DIAGNOSIS — M51369 Other intervertebral disc degeneration, lumbar region without mention of lumbar back pain or lower extremity pain: Secondary | ICD-10-CM

## 2019-05-03 DIAGNOSIS — M5416 Radiculopathy, lumbar region: Secondary | ICD-10-CM | POA: Diagnosis not present

## 2019-05-03 DIAGNOSIS — M48061 Spinal stenosis, lumbar region without neurogenic claudication: Secondary | ICD-10-CM

## 2019-05-03 DIAGNOSIS — S46101S Unspecified injury of muscle, fascia and tendon of long head of biceps, right arm, sequela: Secondary | ICD-10-CM

## 2019-05-03 DIAGNOSIS — M19011 Primary osteoarthritis, right shoulder: Secondary | ICD-10-CM

## 2019-05-03 DIAGNOSIS — G894 Chronic pain syndrome: Secondary | ICD-10-CM

## 2019-05-03 NOTE — Progress Notes (Signed)
Pain Management Virtual Encounter Note - Virtual Visit via [place] (real-time audio visits between healthcare provider and patient).   Patient's Phone No. & Preferred Pharmacy:  [phone] (home); There is no such number on file (mobile).; (Preferred) [phone] No e-mail address on record  [address] [place] [address] Phone: [phone] Fax: [phone]    Pre-screening note:  Our staff contacted Erika Crawford and offered her an "in person", "face-to-face" appointment versus a telephone encounter. She indicated preferring the telephone encounter, at this time.   Reason for Virtual Visit: COVID-19*  Social distancing based on CDC and AMA recommendations.   I contacted Erika Crawford on 05/03/2019 via video conference.      I clearly identified myself as [name], MD. I verified that I was speaking with the correct person using two identifiers (Name: Erika Crawford, and date of birth: 1941/02/14).  Advanced Informed Consent I sought verbal advanced consent from Erika Crawford for virtual visit interactions. I informed Erika Crawford of possible security and privacy concerns, risks, and limitations associated with providing "not-in-person" medical evaluation and management services. I also informed Erika Crawford of the availability of "in-person" appointments. Finally, I informed her that there would be a charge for the virtual visit and that she could be  personally, fully or partially, financially responsible for it. Erika Crawford expressed understanding and agreed to proceed.   Historic Elements   Erika Crawford is a 78 y.o. year old, female patient evaluated today after her last encounter by our practice on 04/06/2019. Erika Crawford  has a past medical history of Arthritis, Complication of anesthesia, Hypertension, Hypothyroidism, Sleep apnea, and Thyroid  disease. 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 Erika Crawford has a current medication list which includes the following prescription(s): acetaminophen, alendronate, amlodipine, lidocaine, cholecalciferol, cyclobenzaprine, gabapentin, homeopathic products, hydrocodone-acetaminophen, levothyroxine, magnesium, magnesium oxide, menthol (topical analgesic), menthol (topical analgesic), mometasone, naphazoline-pheniramine, olmesartan, omeprazole, ondansetron, prednisone, and trazodone. She  reports that she has quit smoking. She has never used smokeless tobacco. She reports current alcohol use. She reports that she does not use drugs. Erika Crawford is allergic to celebrex [celecoxib]; codeine; etodolac; hydrochlorothiazide; oxycodone; pravastatin; relafen [nabumetone]; and tramadol.   HPI  Today, she is being contacted for a post-procedure assessment.  Virtual first procedure evaluation status post bilateral sacroiliac joint injection performed on 04/05/2019.  This was a diagnostic injection which unfortunately did not help out with the patient's buttock, hip or back pain.  She states that she has obtained better benefit with her previous lumbar epidural steroid injections last one being 01/04/2019.  She is having radicular pain down her right leg.  We discussed repeating lumbar epidural steroid injection prior to the New Year's.  We will place as needed order and instructed patient to call when she would like to proceed with epidural.  Patient endorsed understanding.   UDS:  Summary  Date Value Ref Range Status  07/30/2018 FINAL  Final    Comment:    ==================================================================== TOX[facility] COMP DRUG  ANALYSIS,UR ==================================================================== Test                             Result  Flag       Units Drug Present and Declared for Prescription Verification   Gabapentin                     PRESENT      EXPECTED   Acetaminophen                  PRESENT      EXPECTED   Naproxen                       PRESENT      EXPECTED   Lidocaine                      PRESENT      EXPECTED Drug Absent but Declared for Prescription Verification   Hydrocodone                    Not Detected UNEXPECTED ng/mg creat ==================================================================== Test                      Result    Flag   Units      Ref Range   Creatinine              272              mg/dL      >=20 ==================================================================== Declared Medications:  The flagging and interpretation on this report are based on the  following declared medications.  Unexpected results may arise from  inaccuracies in the declared medications.  **Note: The testing scope of this panel includes these medications:  Gabapentin  Hydrocodone (Hydrocodone-Acetaminophen)  Naproxen  **Note: The testing scope of this panel does not include small to  moderate amounts of these reported medications:  Acetaminophen  Acetaminophen (Hydrocodone-Acetaminophen)  Lidocaine  **Note: The testing scope of this panel does not include following  reported medications:  Alendronate (Fosamax)  Amlodipine (Norvasc)  Cholecalciferol  Levothyroxine  Mometasone  Olmesartan (Benicar)  Omeprazole (Prilosec) ==================================================================== For clinical consultation, please call [phone]. ====================================================================    Laboratory Chemistry Profile (12 mo)  Renal: 08/26/2018: BUN 9; Creatinine, Ser 0.50  Lab Results  Component Value Date   GFRAA >60 08/26/2018   GFRNONAA >60  08/26/2018   Hepatic: No results found for requested labs within last 8760 hours. Lab Results  Component Value Date   AST 16 01/19/2014   ALT 25 01/19/2014   Other: No results found for requested labs within last 8760 hours. Note: Above Lab results reviewed.  Assessment  The primary encounter diagnosis was Lumbar radiculopathy. Diagnoses of Lumbar foraminal stenosis, Lumbar degenerative disc disease, Primary osteoarthritis of right shoulder, Injury of tendon of long head of right biceps, sequela, and Chronic pain syndrome were also pertinent to this visit.  Plan of Care  I am having Erika Crawford maintain her alendronate, levothyroxine, mometasone, omeprazole, olmesartan, cholecalciferol, amLODipine, ASPERCREME LIDOCAINE EX, Homeopathic Products (THERAWORX RELIEF EX), gabapentin, (Menthol, Topical Analgesic, (BIOFREEZE EX)), (Menthol, Topical Analgesic, (TWO [place] ARTHRITIS EX)), Naphazoline-Pheniramine (OPCON-A OP), HYDROcodone-acetaminophen, cyclobenzaprine, Acetaminophen, magnesium oxide, predniSONE, ondansetron, Magnesium, and traZODone.  Orders:  Orders Placed This Encounter  Procedures  . LESI (PRN)    Standing Status:   Standing    Number of Occurrences:   9    Standing Expiration Date:   10/30/2020    Scheduling Instructions:     Purpose: Palliative     Indication: Lower extremity pain/Sciatica  unspecified side (M54.30).     Side: Midline     Level: TBD     Sedation: without     TIMEFRAME: PRN procedure. (Ms. Arber will call when needed.)    Order Specific Question:   Where will this procedure be performed?    Answer:   [facility]   Follow-up plan:   Return if symptoms worsen or fail to improve.     Previous lumbar epidurals have been very helpful in managing her lumbar radicular pain every 3 to every 4 months previously done on 01/04/2019.  status post SI joint injection 04/05/2019-not effective, repeat ESI prn.     Recent Visits Date Type Provider  Dept  04/05/19 Procedure visit [name], MD [facility]  02/16/19 Office Visit [name], MD [facility]  02/08/19 Office Visit [name], MD [facility]  Showing recent visits within past 90 days and meeting all other requirements   Today's Visits Date Type Provider Dept  05/03/19 Office Visit [name], MD [facility]  Showing today's visits and meeting all other requirements   Future Appointments No visits were found meeting these conditions.  Showing future appointments within next 90 days and meeting all other requirements   I discussed the assessment and treatment plan with the patient. The patient was provided an opportunity to ask questions and all were answered. The patient agreed with the plan and demonstrated an understanding of the instructions.  Patient advised to call back or seek an in-person evaluation if the symptoms or condition worsens.  Total duration of non-face-to-face encounter: 15 minutes.  Note by: [name], MD Date: 05/03/2019; Time: 3:17 PM  Note: This dictation was prepared with Dragon dictation. Any transcriptional errors that may result from this process are unintentional.  Disclaimer:  * Given the special circumstances of the COVID-19 pandemic, the federal government has announced that the Office for Civil Rights (OCR) will exercise its enforcement discretion and will not impose penalties on physicians using telehealth in the event of noncompliance with regulatory requirements under the [place] (HIPAA) in connection with the good faith provision of telehealth during the XX123456 national public health emergency. ([place])

## 2019-05-19 ENCOUNTER — Encounter: Payer: Self-pay | Admitting: Student in an Organized Health Care Education/Training Program

## 2019-05-19 ENCOUNTER — Ambulatory Visit
Admission: RE | Admit: 2019-05-19 | Discharge: 2019-05-19 | Disposition: A | Discharge status: Home / Self Care | Payer: 59 | Source: Ambulatory Visit | Attending: Student in an Organized Health Care Education/Training Program | Admitting: Student in an Organized Health Care Education/Training Program

## 2019-05-19 ENCOUNTER — Other Ambulatory Visit: Payer: Self-pay

## 2019-05-19 ENCOUNTER — Ambulatory Visit (HOSPITAL_BASED_OUTPATIENT_CLINIC_OR_DEPARTMENT_OTHER): Payer: 59 | Admitting: Student in an Organized Health Care Education/Training Program

## 2019-05-19 DIAGNOSIS — M5416 Radiculopathy, lumbar region: Secondary | ICD-10-CM | POA: Insufficient documentation

## 2019-05-19 MED ORDER — ROPIVACAINE HCL 2 MG/ML IJ SOLN
2.0000 mL | Freq: Once | INTRAMUSCULAR | Status: DC
  Filled 2019-05-19: qty 10

## 2019-05-19 MED ORDER — DEXAMETHASONE SODIUM PHOSPHATE 10 MG/ML IJ SOLN
10.0000 mg | Freq: Once | INTRAMUSCULAR | Status: AC
  Administered 2019-05-19: 10 mg
  Filled 2019-05-19: qty 1

## 2019-05-19 MED ORDER — LIDOCAINE HCL 2 % IJ SOLN
20.0000 mL | Freq: Once | INTRAMUSCULAR | Status: AC
  Administered 2019-05-19: 400 mg

## 2019-05-19 MED ORDER — IOHEXOL 180 MG/ML  SOLN
10.0000 mL | Freq: Once | INTRAMUSCULAR | Status: DC
  Filled 2019-05-19: qty 20

## 2019-05-19 MED ORDER — SODIUM CHLORIDE 0.9% FLUSH
2.0000 mL | Freq: Once | INTRAVENOUS | Status: AC
  Administered 2019-05-19: 10 mL

## 2019-05-19 NOTE — Progress Notes (Signed)
Patient's Name: Erika Crawford  MRN: [id number]  Referring Provider: [name], MD  DOB: 07-15-1940  PCP: Baxter Hire, MD  DOS: 05/19/2019  Note by: [name], MD  Service setting: Ambulatory outpatient  Specialty: Interventional Pain Management  Patient type: Established  Location: [facility] ([facility]) [facility]  Visit type: Interventional Procedure   Primary Reason for Visit: Interventional Pain Management Treatment. CC: Back Pain (low) and Leg Pain (posterior and to knee) L>R Procedure:       Anesthesia, Analgesia, Anxiolysis:  Type: Therapeutic Inter-Laminar Epidural Steroid Injection (previously done 12/2018) Region: Lumbar Level: L5-S1 Level. Laterality: Left-Sided         Type: Local Anesthesia Indication(s): Analgesia         Route: Infiltration ([place]/IM) IV Access: Declined Sedation: Declined  Local Anesthetic: Lidocaine 1-2%   Indications: 1. Lumbar radiculopathy    Pain Score: Pre-procedure: 3/10 Post-procedure: 0-No pain/10  Pre-op Assessment:  Erika Crawford is a 78 y.o. (year old), female patient, seen today for interventional treatment. 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 Erika Crawford has a current medication list which includes the following prescription(s): acetaminophen, alendronate, amlodipine, lidocaine, cholecalciferol, cyclobenzaprine, homeopathic products, levothyroxine, magnesium, magnesium oxide, menthol (topical analgesic), menthol (topical analgesic), mometasone, naphazoline-pheniramine, olmesartan, omeprazole, ondansetron, trazodone, gabapentin, hydrocodone-acetaminophen, and prednisone, and the following Facility-Administered Medications: iohexol and ropivacaine (pf) 2 mg/ml (0.2%). Her primarily concern today is the Back Pain (low) and Leg Pain  (posterior and to knee)  Initial Vital Signs:  Pulse Rate: 87 Temp: 98.2 F (36.8 C) Resp: 18 BP: 126/75 SpO2: 99 %  BMI: Estimated body mass index is 32.26 kg/m as calculated from the following:   Height as of this encounter: 5\' 7"  (1.702 m).   Weight as of this encounter: 206 lb (93.4 kg).  Risk Assessment: Allergies: Reviewed. She is allergic to celebrex [celecoxib]; codeine; etodolac; hydrochlorothiazide; oxycodone; pravastatin; relafen [nabumetone]; and tramadol.  Allergy Precautions: None required Coagulopathies: Reviewed. None identified.  Blood-thinner therapy: None at this time Active Infection(s): Reviewed. None identified. Erika Crawford is afebrile  Site Confirmation: Erika Crawford was asked to confirm the procedure and laterality before marking the site Procedure checklist: Completed Consent: Before the procedure and under the influence of no sedative(s), amnesic(s), or anxiolytics, the patient was informed of the treatment options, risks and possible complications. To fulfill our ethical and legal obligations, as recommended by the American Medical Association's Code of Ethics, I have informed the patient of my clinical impression; the nature and purpose of the treatment or procedure; the risks, benefits, and possible complications of the intervention; the alternatives, including doing nothing; the risk(s) and benefit(s) of the alternative treatment(s) or procedure(s); and the risk(s) and benefit(s) of doing nothing. The patient was provided information about the general risks and possible complications associated with the procedure. These may include, but are not limited to: failure to achieve desired goals, infection, bleeding, organ or nerve damage, allergic reactions, paralysis, and death. In addition, the patient was informed of those risks and complications associated to Spine-related procedures, such as failure to decrease pain; infection (i.e.: Meningitis, epidural or  intraspinal abscess); bleeding (i.e.: epidural hematoma, subarachnoid hemorrhage, or any other type of intraspinal or peri-dural bleeding); organ or nerve damage (i.e.: Any type of peripheral nerve, nerve root, or spinal cord injury) with subsequent damage to sensory, motor, and/or autonomic systems, resulting  in permanent pain, numbness, and/or weakness of one or several areas of the body; allergic reactions; (i.e.: anaphylactic reaction); and/or death. Furthermore, the patient was informed of those risks and complications associated with the medications. These include, but are not limited to: allergic reactions (i.e.: anaphylactic or anaphylactoid reaction(s)); adrenal axis suppression; blood sugar elevation that in diabetics may result in ketoacidosis or comma; water retention that in patients with history of congestive heart failure may result in shortness of breath, pulmonary edema, and decompensation with resultant heart failure; weight gain; swelling or edema; medication-induced neural toxicity; particulate matter embolism and blood vessel occlusion with resultant organ, and/or nervous system infarction; and/or aseptic necrosis of one or more joints. Finally, the patient was informed that Medicine is not an exact science; therefore, there is also the possibility of unforeseen or unpredictable risks and/or possible complications that may result in a catastrophic outcome. The patient indicated having understood very clearly. We have given the patient no guarantees and we have made no promises. Enough time was given to the patient to ask questions, all of which were answered to the patient's satisfaction. Erika Crawford has indicated that she wanted to continue with the procedure. Attestation: I, the ordering provider, attest that I have discussed with the patient the benefits, risks, side-effects, alternatives, likelihood of achieving goals, and potential problems during recovery for the procedure that I have  provided informed consent. Date  Time:   Pre-Procedure Preparation:  Monitoring: As per clinic protocol. Respiration, ETCO2, SpO2, BP, heart rate and rhythm monitor placed and checked for adequate function Safety Precautions: Patient was assessed for positional comfort and pressure points before starting the procedure. Time-out: I initiated and conducted the "Time-out" before starting the procedure, as per protocol. The patient was asked to participate by confirming the accuracy of the "Time Out" information. Verification of the correct person, site, and procedure were performed and confirmed by me, the nursing staff, and the patient. "Time-out" conducted as per Joint Commission's Universal Protocol (UP.01.01.01). Time: 1114  Description of Procedure:       Position: Prone with head of the table was raised to facilitate breathing. Target Area: The interlaminar space, initially targeting the lower laminar border of the superior vertebral body. Approach: Paramedial approach. Area Prepped: Entire Posterior Lumbar Region Prepping solution: ChloraPrep (2% chlorhexidine gluconate and 70% isopropyl alcohol) Safety Precautions: Aspiration looking for blood return was conducted prior to all injections. At no point did we inject any substances, as a needle was being advanced. No attempts were made at seeking any paresthesias. Safe injection practices and needle disposal techniques used. Medications properly checked for expiration dates. SDV (single dose vial) medications used. Description of the Procedure: Protocol guidelines were followed. The procedure needle was introduced through the skin, ipsilateral to the reported pain, and advanced to the target area. Bone was contacted and the needle walked caudad, until the lamina was cleared. The epidural space was identified using "loss-of-resistance technique" with 2-3 ml of PF-NaCl (0.9% NSS), in a 5cc LOR glass syringe. Vitals:   05/19/19 1049 05/19/19 1117  05/19/19 1121 05/19/19 1125  BP: 126/75 (!) 156/98 (!) 160/76 (!) 147/93  Pulse: 87     Resp: 18 16 15 14   Temp: 98.2 F (36.8 C)     TempSrc: Oral     SpO2: 99% 97% 98% 100%  Weight: 206 lb (93.4 kg)     Height: 5\' 7"  (1.702 m)       Start Time: 1114 hrs. End Time: 1125 hrs. Materials:  Needle(s) Type: Epidural needle Gauge: 17G Length: 5-in Medication(s): Please see orders for medications and dosing details. 8 CC solution made of 5 cc of preservative-free saline, 2 cc of 0.2% ropivacaine, 1 cc of Decadron 10 mill grams per cc Imaging Guidance (Spinal):  Type of Imaging Technique: Fluoroscopy Guidance (Spinal) Indication(s): Assistance in needle guidance and placement for procedures requiring needle placement in or near specific anatomical locations not easily accessible without such assistance. Exposure Time: Please see nurses notes. Contrast: Before injecting any contrast, we confirmed that the patient did not have an allergy to iodine, shellfish, or radiological contrast. Once satisfactory needle placement was completed at the desired level, radiological contrast was injected. Contrast injected under live fluoroscopy. No contrast complications. See chart for type and volume of contrast used. Fluoroscopic Guidance: I was personally present during the use of fluoroscopy. "Tunnel Vision Technique" used to obtain the best possible view of the target area. Parallax error corrected before commencing the procedure. "Direction-depth-direction" technique used to introduce the needle under continuous pulsed fluoroscopy. Once target was reached, antero-posterior, oblique, and lateral fluoroscopic projection used confirm needle placement in all planes. Images permanently stored in EMR. Interpretation: I personally interpreted the imaging intraoperatively. Adequate needle placement confirmed in multiple planes. Appropriate spread of contrast into desired area was observed. No evidence of afferent or  efferent intravascular uptake. No intrathecal or subarachnoid spread observed. Permanent images saved into the patient's record.  Antibiotic Prophylaxis:   Anti-infectives (From admission, onward)   None     Indication(s): None identified  Post-operative Assessment:  Post-procedure Vital Signs:  Pulse Rate: 87 Temp: 98.2 F (36.8 C) Resp: 14 BP: (!) 147/93 SpO2: 100 %  EBL: None  Complications: No immediate post-treatment complications observed by team, or reported by patient.  Note: The patient tolerated the entire procedure well. A repeat set of vitals were taken after the procedure and the patient was kept under observation following institutional policy, for this type of procedure. Post-procedural neurological assessment was performed, showing return to baseline, prior to discharge. The patient was provided with post-procedure discharge instructions, including a section on how to identify potential problems. Should any problems arise concerning this procedure, the patient was given instructions to immediately contact us, at any time, without hesitation. In any case, we plan to contact the patient by telephone for a follow-up status report regarding this interventional procedure.  Comments:  No additional relevant information.  5 out of 5 strength bilateral lower extremity: Plantar flexion, dorsiflexion, knee flexion, knee extension.  Plan of Care    Imaging Orders     Fluoro (C-Arm) (<60 min) (No Report) Procedure Orders    No procedure(s) ordered today    Medications ordered for procedure: Meds ordered this encounter  Medications  . iohexol (OMNIPAQUE) 180 MG/ML injection 10 mL    Must be Myelogram-compatible. If not available, you may substitute with a water-soluble, non-ionic, hypoallergenic, myelogram-compatible radiological contrast medium.  Mar[place] Kitchen lidocaine (XYLOCAINE) 2 % (with pres) injection 400 mg  . ropivacaine (PF) 2 mg/mL (0.2%) (NAROPIN) injection 2 mL  .  sodium chloride flush (NS) 0.9 % injection 2 mL  . dexamethasone (DECADRON) injection 10 mg   Medications administered: We administered lidocaine, sodium chloride flush, and dexamethasone.  See the medical record for exact dosing, route, and time of administration.  New Prescriptions   No medications on file   Disposition: Discharge home  Discharge Date & Time: 05/19/2019; 1135 hrs.   Physician-requested Follow-up: Return in about 6 weeks (around 06/30/2019)  for Post Procedure Evaluation, virtual.  Future Appointments  Date Time Provider [place]  06/29/2019 11:30 AM [name], MD [facility] None   Primary Care Physician: Baxter Hire, MD Location: [facility] Note by: [name], MD Date: 05/19/2019; Time: 11:35 AM  Disclaimer:  Medicine is not an exact science. The only guarantee in medicine is that nothing is guaranteed. It is important to note that the decision to proceed with this intervention was based on the information collected from the patient. The Data and conclusions were drawn from the patient's questionnaire, the interview, and the physical examination. Because the information was provided in large part by the patient, it cannot be guaranteed that it has not been purposely or unconsciously manipulated. Every effort has been made to obtain as much relevant data as possible for this evaluation. It is important to note that the conclusions that lead to this procedure are derived in large part from the available data. Always take into account that the treatment will also be dependent on availability of resources and existing treatment guidelines, considered by other Pain Management Practitioners as being common knowledge and practice, at the time of the intervention. For Medico-Legal purposes, it is also important to point out that variation in procedural techniques and pharmacological choices are the acceptable norm. The indications,  contraindications, technique, and results of the above procedure should only be interpreted and judged by a Board-Certified Interventional Pain Specialist with extensive familiarity and expertise in the same exact procedure and technique.

## 2019-05-19 NOTE — Progress Notes (Signed)
Safety precautions to be maintained throughout the outpatient stay will include: orient to surroundings, keep bed in low position, maintain call bell within reach at all times, provide assistance with transfer out of bed and ambulation.  

## 2019-05-19 NOTE — Patient Instructions (Signed)

## 2019-06-24 DIAGNOSIS — M8949 Other hypertrophic osteoarthropathy, multiple sites: Secondary | ICD-10-CM | POA: Diagnosis not present

## 2019-06-28 ENCOUNTER — Telehealth: Payer: Self-pay

## 2019-06-28 ENCOUNTER — Encounter: Payer: Self-pay | Admitting: Student in an Organized Health Care Education/Training Program

## 2019-06-28 NOTE — Telephone Encounter (Signed)
LM for patient to call back for pre virtual appointment questions.  °

## 2019-06-29 ENCOUNTER — Ambulatory Visit
Payer: Medicare HMO | Attending: Student in an Organized Health Care Education/Training Program | Admitting: Student in an Organized Health Care Education/Training Program

## 2019-06-29 ENCOUNTER — Other Ambulatory Visit: Payer: Self-pay

## 2019-06-29 DIAGNOSIS — M48061 Spinal stenosis, lumbar region without neurogenic claudication: Secondary | ICD-10-CM | POA: Diagnosis not present

## 2019-06-29 DIAGNOSIS — M5116 Intervertebral disc disorders with radiculopathy, lumbar region: Secondary | ICD-10-CM

## 2019-06-29 DIAGNOSIS — M5136 Other intervertebral disc degeneration, lumbar region: Secondary | ICD-10-CM

## 2019-06-29 DIAGNOSIS — M5416 Radiculopathy, lumbar region: Secondary | ICD-10-CM

## 2019-06-29 NOTE — Progress Notes (Signed)
Patient: Erika Crawford  Service Category: E/M  Provider: [name], MD  DOB: December 12, 1940  DOS: 06/29/2019  Location: Office  MRN: [id number]  Setting: Ambulatory outpatient  Referring Provider: Baxter Hire, MD  Type: Established Patient  Specialty: Interventional Pain Management  PCP: Baxter Hire, MD  Location: Home  Delivery: TeleHealth     Virtual Encounter - Pain Management PROVIDER NOTE: Information contained herein reflects review and annotations entered in association with encounter. Interpretation of such information and data should be left to medically-trained personnel. Information provided to patient can be located elsewhere in the medical record under "Patient Instructions". Document created using STT-dictation technology, any transcriptional errors that may result from process are unintentional.    Contact & Pharmacy Preferred: [phone] Home: [phone] (home) Mobile: There is no such number on file (mobile). E-mail: No e-mail address on record  [facility] [place], [place] [address] Phone: [phone] Fax: [phone]   Pre-screening  Erika Crawford offered "in-person" vs "virtual" encounter. She indicated preferring virtual for this encounter.   Reason COVID-19*  Social distancing based on CDC and AMA recommendations.   I contacted Erika Crawford on 06/29/2019 via telephone.      I clearly identified myself as [name], MD. I verified that I was speaking with the correct person using two identifiers (Name: Erika Crawford, and date of birth: February 07, 1941).   This visit was completed via telephone due to the restrictions of the COVID-19 pandemic. All issues as above were discussed and addressed but no physical exam was performed. If it was felt that the patient should be evaluated in the office, they were directed there. The patient verbally consented to this  visit. Patient was unable to complete an audio/visual visit due to Technical difficulties and/or Lack of internet. Due to the catastrophic nature of the COVID-19 pandemic, this visit was done through audio contact only.  Location of the patient: home address (see Epic for details)  Location of the provider: office  Consent I sought verbal advanced consent from Erika Crawford for virtual visit interactions. I informed Erika Crawford of possible security and privacy concerns, risks, and limitations associated with providing "not-in-person" medical evaluation and management services. I also informed Erika Crawford of the availability of "in-person" appointments. Finally, I informed her that there would be a charge for the virtual visit and that she could be  personally, fully or partially, financially responsible for it. Erika Crawford expressed understanding and agreed to proceed.   Historic Elements   Erika Crawford is a 79 y.o. year old, female patient evaluated today after her last encounter by our practice on 06/28/2019. Erika Crawford  has a past medical history of Arthritis, Complication of anesthesia, Hypertension, Hypothyroidism, Sleep apnea, and Thyroid disease. 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 Erika Crawford has a current medication list which includes the following prescription(s): acetaminophen, alendronate, amlodipine, lidocaine, cholecalciferol, cyclobenzaprine, homeopathic products, hydrocodone-acetaminophen, levothyroxine, magnesium, meloxicam, menthol (topical analgesic), menthol (topical analgesic), mometasone, naphazoline-pheniramine, olmesartan, omeprazole, ondansetron, gabapentin, magnesium oxide, prednisone, and trazodone. She  reports that she has quit smoking. She has never used  smokeless tobacco. She reports current alcohol use. She reports that she does not use drugs. Erika Crawford is  allergic to celebrex [celecoxib]; codeine; etodolac; hydrochlorothiazide; oxycodone; pravastatin; relafen [nabumetone]; and tramadol.   HPI  Today, she is being contacted for a post-procedure assessment.   Unfortunately no significant benefit after previous lumbar epidural steroid injection performed on 05/19/2019.  This was done the left L5-S1.  Given lack of pain relief, patient did receive a steroid taper from her primary care provider that helped out with her low back and left hip and thigh pain.  She states that she is doing better now and is not having as significant pain after her steroid taper.  UDS:  Summary  Date Value Ref Range Status  07/30/2018 FINAL  Final    Comment:    ==================================================================== TOXAS[facility]RE COMP DRUG ANALYSIS,UR ==================================================================== Test                             Result       Flag       Units Drug Present and Declared for Prescription Verification   Gabapentin                     PRESENT      EXPECTED   Acetaminophen                  PRESENT      EXPECTED   Naproxen                       PRESENT      EXPECTED   Lidocaine                      PRESENT      EXPECTED Drug Absent but Declared for Prescription Verification   Hydrocodone                    Not Detected UNEXPECTED ng/mg creat ==================================================================== Test                      Result    Flag   Units      Ref Range   Creatinine              272              mg/dL      >=20 ==================================================================== Declared Medications:  The flagging and interpretation on this report are based on the  following declared medications.  Unexpected results may arise from  inaccuracies in the declared medications.  **Note: The testing scope  of this panel includes these medications:  Gabapentin  Hydrocodone (Hydrocodone-Acetaminophen)  Naproxen  **Note: The testing scope of this panel does not include small to  moderate amounts of these reported medications:  Acetaminophen  Acetaminophen (Hydrocodone-Acetaminophen)  Lidocaine  **Note: The testing scope of this panel does not include following  reported medications:  Alendronate (Fosamax)  Amlodipine (Norvasc)  Cholecalciferol  Levothyroxine  Mometasone  Olmesartan (Benicar)  Omeprazole (Prilosec) ==================================================================== For clinical consultation, please call [phone]. ====================================================================    Laboratory Chemistry Profile (12 mo)  Renal: 08/26/2018: BUN 9; Creatinine, Ser 0.50  Lab Results  Component Value Date   GFRAA >60 08/26/2018   GFRNONAA >60 08/26/2018   Hepatic: No results found for requested labs within last 8760 hours. Lab Results  Component Value Date   AST 16 01/19/2014   ALT 25 01/19/2014   Other: No results found for requested labs within last 8760 hours.  Note: Above Lab results reviewed.  Imaging  Fluoro (C-Arm) (<60 min) (No Report) Fluoro was used, but no Radiologist interpretation will be provided.  Please refer to "NOTES" tab for provider progress note.   Assessment  The primary encounter diagnosis was Lumbar radiculopathy. Diagnoses of Lumbar foraminal stenosis and Lumbar degenerative disc disease were also pertinent to this visit.  Plan of Care   I am having Erika Crawford maintain her alendronate, levothyroxine, mometasone, omeprazole, olmesartan, cholecalciferol, amLODipine, ASPERCREME LIDOCAINE EX, Homeopathic Products (THERAWORX RELIEF EX), gabapentin, (Menthol, Topical Analgesic, (BIOFREEZE EX)), (Menthol, Topical Analgesic, (TWO [place]ATS ARTHRITIS EX)), Naphazoline-Pheniramine (OPCON-A OP), HYDROcodone-acetaminophen,  cyclobenzaprine, Acetaminophen, magnesium oxide, predniSONE, ondansetron, Magnesium, traZODone, and meloxicam.  Follow-up plan:   Return if symptoms worsen or fail to improve.     Previous lumbar epidurals have been very helpful in managing her lumbar radicular pain every 3 to every 4 months previously done on 01/04/2019.  status post SI joint injection 04/05/2019-not effective, repeat ESI prn.      Recent Visits Date Type Provider Dept  05/19/19 Procedure visit [name], MD [facility]  05/03/19 Office Visit [name], MD [facility]  04/05/19 Procedure visit [name], MD [facility]  Showing recent visits within past 90 days and meeting all other requirements   Today's Visits Date Type Provider Dept  06/29/19 Office Visit [name], MD [facility]  Showing today's visits and meeting all other requirements   Future Appointments No visits were found meeting these conditions.  Showing future appointments within next 90 days and meeting all other requirements   I discussed the assessment and treatment plan with the patient. The patient was provided an opportunity to ask questions and all were answered. The patient agreed with the plan and demonstrated an understanding of the instructions.  Patient advised to call back or seek an in-person evaluation if the symptoms or condition worsens.  Total duration of non-face-to-face encounter: 12 minutes.  Note by: [name], MD Date: 06/29/2019; Time: 12:13 PM

## 2019-07-02 ENCOUNTER — Other Ambulatory Visit: Payer: Self-pay | Admitting: Internal Medicine

## 2019-07-02 DIAGNOSIS — Z1231 Encounter for screening mammogram for malignant neoplasm of breast: Secondary | ICD-10-CM

## 2019-07-05 DIAGNOSIS — M7582 Other shoulder lesions, left shoulder: Secondary | ICD-10-CM | POA: Diagnosis not present

## 2019-07-05 DIAGNOSIS — M19012 Primary osteoarthritis, left shoulder: Secondary | ICD-10-CM | POA: Diagnosis not present

## 2019-07-05 DIAGNOSIS — Z96611 Presence of right artificial shoulder joint: Secondary | ICD-10-CM | POA: Diagnosis not present

## 2019-07-05 DIAGNOSIS — M75112 Incomplete rotator cuff tear or rupture of left shoulder, not specified as traumatic: Secondary | ICD-10-CM | POA: Diagnosis not present

## 2019-07-05 DIAGNOSIS — M7989 Other specified soft tissue disorders: Secondary | ICD-10-CM | POA: Diagnosis not present

## 2019-07-16 ENCOUNTER — Ambulatory Visit: Payer: Medicare HMO | Admitting: Occupational Therapy

## 2019-07-16 DIAGNOSIS — Z87891 Personal history of nicotine dependence: Secondary | ICD-10-CM | POA: Diagnosis not present

## 2019-07-16 DIAGNOSIS — R252 Cramp and spasm: Secondary | ICD-10-CM | POA: Diagnosis not present

## 2019-07-22 ENCOUNTER — Other Ambulatory Visit: Payer: Self-pay

## 2019-07-22 ENCOUNTER — Ambulatory Visit: Payer: Medicare HMO | Admitting: Occupational Therapy

## 2019-07-26 ENCOUNTER — Ambulatory Visit: Payer: Medicare HMO

## 2019-07-26 ENCOUNTER — Other Ambulatory Visit: Payer: Self-pay

## 2019-07-26 ENCOUNTER — Ambulatory Visit: Payer: Medicare Other | Attending: Surgery | Admitting: Occupational Therapy

## 2019-07-26 ENCOUNTER — Encounter: Payer: Self-pay | Admitting: Occupational Therapy

## 2019-07-26 DIAGNOSIS — R600 Localized edema: Secondary | ICD-10-CM | POA: Insufficient documentation

## 2019-07-26 NOTE — Patient Instructions (Signed)
See note

## 2019-07-26 NOTE — Therapy (Signed)
[facility] [address], [place], [address] Phone: [phone]   Fax:  [phone]  Occupational Therapy Evaluation  Patient Details  Name: Erika Crawford MRN: [id number] Date of Birth: 27-May-1941 Referring Provider (OT): Dr Roland Rack   Encounter Date: 07/26/2019  OT End of Session - 07/26/19 1250    Visit Number  1    Number of Visits  3    Date for OT Re-Evaluation  08/16/19    OT Start Time  1102    OT Stop Time  1137    OT Time Calculation (min)  35 min    Activity Tolerance  Patient tolerated treatment well    Behavior During Therapy  [facility] for tasks assessed/performed       Past Medical History:  Diagnosis Date  . Arthritis   . Complication of anesthesia    dizziness  . Hypertension   . Hypothyroidism   . Sleep apnea    cpap  . Thyroid disease     Past Surgical History:  Procedure Laterality Date  . ABDOMINAL HYSTERECTOMY    . BACK SURGERY     lower middle kyphoplasty  . COLONOSCOPY WITH PROPOFOL N/A 09/21/2015   Procedure: COLONOSCOPY WITH PROPOFOL;  Surgeon: Hulen Luster, MD;  Location: [facility] ENDOSCOPY;  Service: Gastroenterology;  Laterality: N/A;  . JOINT REPLACEMENT     03/19/2016- left knee replacement  . REVERSE SHOULDER ARTHROPLASTY Right 08/25/2018   Procedure: REVERSE SHOULDER ARTHROPLASTY, RIGHT;  Surgeon: Corky Mull, MD;  Location: [facility] ORS;  Service: Orthopedics;  Laterality: Right;  . 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   Procedure: TOTAL KNEE ARTHROPLASTY;  Surgeon: Corky Mull, MD;  Location: [facility] ORS;  Service: Orthopedics;  Laterality: Left;    There were no vitals filed for this visit.  Subjective Assessment - 07/26/19 1246    Subjective   I had no swelling before my R shoulder surgery - was worse after but gradually went down but now it just stays in my forearm to elbow - no pain but it does not get better    Pertinent History  Pt had March 2020 R reverse shoulder  replacement - pt report had some swelling in the arm since then - just do not want to get better- in wrist to elbow - no pain - Dr Roland Rack refer to OT for eval    Patient Stated Goals  Want to know what is causing it and get the swelling down    Currently in Pain?  No/denies        [facility] OT Assessment - 07/26/19 0001      Assessment   Medical Diagnosis  R forearm sweling S/p shoulder surgery     Referring Provider (OT)  Dr Roland Rack    Onset Date/Surgical Date  08/16/18    Hand Dominance  Right    Prior Therapy  --   PT for shoulder     Home  Environment   Lives With  Alone      Prior Function   Vocation  Retired    Leisure  Do own housework, watch tv, cook little ,word searches       AROM   Right Wrist Extension  77 Degrees    Right Wrist Flexion  85 Degrees    Right Wrist Radial Deviation  15 Degrees    Right Wrist Ulnar Deviation  35 Degrees    Left Wrist Extension  72 Degrees  Left Wrist Flexion  87 Degrees    Left Wrist Radial Deviation  15 Degrees    Left Wrist Ulnar Deviation  36 Degrees      Right Hand AROM   R Index  MCP 0-90  75 Degrees    R Index PIP 0-100  100 Degrees    R Long  MCP 0-90  85 Degrees    R Long PIP 0-100  100 Degrees    R Ring  MCP 0-90  90 Degrees    R Ring PIP 0-100  100 Degrees    R Little  MCP 0-90  90 Degrees    R Little PIP 0-100  100 Degrees      Left Hand AROM   L Index  MCP 0-90  75 Degrees    L Index PIP 0-100  100 Degrees    L Long  MCP 0-90  85 Degrees    L Long PIP 0-100  100 Degrees    L Ring  MCP 0-90  90 Degrees    L Ring PIP 0-100  100 Degrees    L Little  MCP 0-90  90 Degrees    L Little PIP 0-100  100 Degrees       LYMPHEDEMA/ONCOLOGY QUESTIONNAIRE - 07/26/19 1135      Right Upper Extremity Lymphedema   10 cm Proximal to Olecranon Process  35 cm    Olecranon Process  28 cm    15 cm Proximal to Ulnar Styloid Process  25 cm    10 cm Proximal to Ulnar Styloid Process  22 cm    Just Proximal to Ulnar Styloid Process   17.8 cm    Across Hand at [employer]  19.2 cm    At [place] of 2nd Digit  5.9 cm    At [facility] of Thumb  5.9 cm      Left Upper Extremity Lymphedema   10 cm Proximal to Olecranon Process  34.5 cm    Olecranon Process  28.2 cm    15 cm Proximal to Ulnar Styloid Process  24.2 cm    10 cm Proximal to Ulnar Styloid Process  20.8 cm    Just Proximal to Ulnar Styloid Process  16.4 cm    Across Hand at [employer]  18.2 cm    At [place] of 2nd Digit  5.8 cm    At [facility] of Thumb  5.8 cm         Pt was fitted with isotoner glove for R hand - and tubigrip soft for hand to elbow to wear night and daytime  But take off for ADL's and in the am and pm for 1-2 hrs  Will reassess in week            OT Education - 07/26/19 1249    Education Details  findings and HEP    Person(s) Educated  Patient    Methods  Explanation;Demonstration;Tactile cues;Verbal cues;Handout    Comprehension  Verbal cues required;Returned demonstration;Verbalized understanding          OT Long Term Goals - 07/26/19 1255      OT LONG TERM GOAL #1   Title  Pt R UE/wrist and forearm circumference decrease by 1 cm    Baseline  wrist increase by 1.4 cm and forearm 1.2 cm  - no pain or decrease ROM or strength    Time  3    Period  Weeks    Status  New  Target Date  08/16/19            Plan - 07/26/19 1251    Clinical Impression Statement  Pt present at OT eval with some swelling in the R forearm to elbow- pt is 11 months s/p R reverse shoulder replacement - pt did not had any edema prior to surgery and report that nothing increases it - staying about the same - no pain and pt has normal AROM and strength in R wrist and hand -pt has no history of breast CA or melanoma that involve the lymphatic system -  pt't  wrist increase by 1.4 cm , forearm 1.2 cm - but pt is R hand dominant - pt was provided with compression glove and forerarm to elbow compressio - pt to use for week and will reasses    OT  Occupational Profile and History  Problem Focused Assessment - Including review of records relating to presenting problem    Occupational performance deficits (Please refer to evaluation for details):  ADL's    Body Structure / Function / Physical Skills  ADL;Edema    Rehab Potential  Fair    Clinical Decision Making  Limited treatment options, no task modification necessary    Comorbidities Affecting Occupational Performance:  May have comorbidities impacting occupational performance    Modification or Assistance to Complete Evaluation   No modification of tasks or assist necessary to complete eval    OT Frequency  1x / week    OT Duration  --   3 wks   OT Treatment/Interventions  Self-care/ADL training;Patient/family education;Compression bandaging;Manual Therapy    Plan  assess progress with [facility]    Consulted and Agree with Plan of Care  Patient       Patient will benefit from skilled therapeutic intervention in order to improve the following deficits and impairments:   Body Structure / Function / Physical Skills: ADL, Edema       Visit Diagnosis: Localized edema - Plan: Ot plan of care cert/re-cert    Problem List Patient Active Problem List   Diagnosis Date Noted  . 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 Chronic left-sided low back pain with left-sided sciatica 06/22/2018  . Vitamin B 12 deficiency 01/02/2018  . Chronic anemia 09/06/2017  . Chronic constipation 06/08/2017  . Lumbar radiculopathy 05/22/2017  . Spinal stenosis of lumbosacral region 03/19/2017  . 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 Acute bilateral low back pain without sciatica 02/04/2017  . Dysuria 12/13/2016  . Prediabetes 11/05/2016  . Acquired hypothyroidism 08/24/2016  . Myofascial pain syndrome 08/24/2016  . Essential hypertension 08/24/2016  . Osteopenia of multiple sites 08/24/2016  . Lumbar degenerative disc disease 08/24/2016  .  Bilateral hydronephrosis 07/15/2016  . Rotator cuff tendinitis, left 06/21/2016  . 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 Rotator cuff tendinitis, right 11/27/2015  . 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  . Injury of tendon of long head of right biceps 11/27/2015  . Incomplete tear of right rotator cuff 11/27/2015  . Obstructive sleep apnea on CPAP 07/13/2015  . Osteoarthritis 11/04/2013  . Disorder of uterus 04/29/1995    Rosalyn Gess OTR/L,CLT 07/26/2019, 12:59 PM  [facility] [address] [place], [address] Phone: [phone]   Fax:  [phone]  Name: LACIE LEHL MRN: [id number] Date of Birth: [birth year]

## 2019-08-03 ENCOUNTER — Other Ambulatory Visit: Payer: Self-pay

## 2019-08-03 ENCOUNTER — Ambulatory Visit: Payer: Medicare Other | Admitting: Occupational Therapy

## 2019-08-03 DIAGNOSIS — R600 Localized edema: Secondary | ICD-10-CM

## 2019-08-03 NOTE — Therapy (Signed)
[place] [facility] [address], [place], [address] Phone: [phone]   Fax:  [phone]  Occupational Therapy Treatment  Patient Details  Name: Erika Crawford MRN: [id number] Date of Birth: Jul 13, 1940 Referring Provider (OT): Dr Roland Rack   Encounter Date: 08/03/2019  OT End of Session - 08/03/19 1548    Visit Number  2    Number of Visits  3    Date for OT Re-Evaluation  08/16/19    OT Start Time  1510    OT Stop Time  1536    OT Time Calculation (min)  26 min    Activity Tolerance  Patient tolerated treatment well    Behavior During Therapy  [facility] for tasks assessed/performed       Past Medical History:  Diagnosis Date  . Arthritis   . Complication of anesthesia    dizziness  . Hypertension   . Hypothyroidism   . Sleep apnea    cpap  . Thyroid disease     Past Surgical History:  Procedure Laterality Date  . ABDOMINAL HYSTERECTOMY    . BACK SURGERY     lower middle kyphoplasty  . COLONOSCOPY WITH PROPOFOL N/A 09/21/2015   Procedure: COLONOSCOPY WITH PROPOFOL;  Surgeon: Hulen Luster, MD;  Location: [facility] ENDOSCOPY;  Service: Gastroenterology;  Laterality: N/A;  . JOINT REPLACEMENT     03/19/2016- left knee replacement  . REVERSE SHOULDER ARTHROPLASTY Right 08/25/2018   Procedure: REVERSE SHOULDER ARTHROPLASTY, RIGHT;  Surgeon: Corky Mull, MD;  Location: [facility] ORS;  Service: Orthopedics;  Laterality: Right;  . 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   Procedure: TOTAL KNEE ARTHROPLASTY;  Surgeon: Corky Mull, MD;  Location: [facility] ORS;  Service: Orthopedics;  Laterality: Left;    There were no vitals filed for this visit.  Subjective Assessment - 08/03/19 1541    Subjective   I don't know if swelling come down - I see it every day - but I did wear the compression glove and sleever most all the time    Pertinent History  Pt had March 2020 R reverse shoulder replacement - pt report had some swelling in the  arm since then - just do not want to get better- in wrist to elbow - no pain - Dr Roland Rack refer to OT for eval    Patient Stated Goals  Want to know what is causing it and get the swelling down    Currently in Pain?  No/denies          LYMPHEDEMA/ONCOLOGY QUESTIONNAIRE - 08/03/19 1510      Right Upper Extremity Lymphedema   10 cm Proximal to Olecranon Process  34.8 cm    Olecranon Process  27.8 cm    15 cm Proximal to Ulnar Styloid Process  25 cm    10 cm Proximal to Ulnar Styloid Process  21.6 cm    Just Proximal to Ulnar Styloid Process  17.5 cm    Across Hand at [employer]  18.5 cm    At [place] of 2nd Digit  5.8 cm    At [facility] of [place]umb  5.8 cm       circumference assess - see flowsheet and compare - did decrease some in hand to elbow on R       Pt ed on doing MLD to R arm  To do in am and pm in supine with shoulder 90 degrees flexion   - Pump up the  outside of left upper arm 15 reps over shoulder   -Pump top of forearm from wrist to elbow 15 times - Pump up the outside of left upper arm 15  times - Pump up the back of the forearm from wrist to elbow 15 times - Pump up the outside of left upper arm 15 times    Pt to cont with isotoner glove for R hand - and  New  tubigrip D cut to wear hand to elbow  But take off for ADL's and in the am and pm for 1-2 hrs           OT Education - 08/03/19 1545    Education Details  progress and changes to HEP    Person(s) Educated  Patient    Methods  Explanation;Demonstration;Tactile cues;Verbal cues;Handout    Comprehension  Verbal cues required;Returned demonstration;Verbalized understanding          OT Long Term Goals - 07/26/19 1255      OT LONG TERM GOAL #1   Title  Pt R UE/wrist and forearm circumference decrease by 1 cm    Baseline  wrist increase by 1.4 cm and forearm 1.2 cm  - no pain or decrease ROM or strength    Time  3    Period  Weeks    Status  New    Target Date  08/16/19            Plan -  08/03/19 1549    Clinical Impression Statement  Pt R forearm did decrease some with use of isotoner glove and tubigrip D from hand to elbow- add this date some MLD for R UE - to do 2 x day -and will reassess in 2wks again R UE circumference    OT Occupational Profile and History  Problem Focused Assessment - Including review of records relating to presenting problem    Occupational performance deficits (Please refer to evaluation for details):  ADL's    Body Structure / Function / Physical Skills  ADL;Edema    Rehab Potential  Fair    Clinical Decision Making  Limited treatment options, no task modification necessary    Comorbidities Affecting Occupational Performance:  May have comorbidities impacting occupational performance    Modification or Assistance to Complete Evaluation   No modification of tasks or assist necessary to complete eval    OT Frequency  Biweekly    OT Duration  --   2 wks   OT Treatment/Interventions  Self-care/ADL training;Patient/family education;Compression bandaging;Manual Therapy    Plan  assess progress with Homeprogram    Consulted and Agree with Plan of Care  Patient       Patient will benefit from skilled therapeutic intervention in order to improve the following deficits and impairments:   Body Structure / Function / Physical Skills: ADL, Edema       Visit Diagnosis: Localized edema    Problem List Patient Active Problem List   Diagnosis Date Noted  . 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 Chronic left-sided low back pain with left-sided sciatica 06/22/2018  . Vitamin B 12 deficiency 01/02/2018  . Chronic anemia 09/06/2017  . Chronic constipation 06/08/2017  . Lumbar radiculopathy 05/22/2017  . Spinal stenosis of lumbosacral region 03/19/2017  . 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 Acute bilateral low back pain without sciatica 02/04/2017  . Dysuria 12/13/2016  . Prediabetes 11/05/2016   . Acquired hypothyroidism 08/24/2016  . Myofascial pain  syndrome 08/24/2016  . Essential hypertension 08/24/2016  . Osteopenia of multiple sites 08/24/2016  . Lumbar degenerative disc disease 08/24/2016  . Bilateral hydronephrosis 07/15/2016  . Rotator cuff tendinitis, left 06/21/2016  . 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 Rotator cuff tendinitis, right 11/27/2015  . 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  . Injury of tendon of long head of right biceps 11/27/2015  . Incomplete tear of right rotator cuff 11/27/2015  . Obstructive sleep apnea on CPAP 07/13/2015  . Osteoarthritis 11/04/2013  . Disorder of uterus 04/29/1995    Rosalyn Gess OTR/L,CLT 08/03/2019, 3:51 PM  [place] [facility] [address], [place], [address] Phone: [phone]   Fax:  [phone]  Name: Erika Crawford MRN: [id number] Date of Birth: Mar 17, 1941

## 2019-08-03 NOTE — Patient Instructions (Signed)
Cont same compression - but new Tubi grip D's  And  In am and pm in supine with shoulder 90 degrees flexion   - Pump up the outside of left upper arm 15 reps over shoulder   -Pump top of forearm from wrist to elbow 15 times - Pump up the outside of left upper arm 15  times - Pump up the back of the forearm from wrist to elbow 15 times - Pump up the outside of left upper arm 15 times

## 2019-08-17 ENCOUNTER — Ambulatory Visit: Payer: Medicare Other | Attending: Surgery | Admitting: Occupational Therapy

## 2019-08-17 ENCOUNTER — Other Ambulatory Visit: Payer: Self-pay

## 2019-08-17 DIAGNOSIS — R600 Localized edema: Secondary | ICD-10-CM | POA: Diagnosis present

## 2019-08-17 NOTE — Therapy (Signed)
[place] [facility] [address] [place], [address] Phone: [phone]   Fax:  [phone]  Occupational Therapy Treatment  Patient Details  Name: Erika Crawford MRN: [id number] Date of Birth: 08-12-1940 Referring Provider (OT): Dr Roland Rack   Encounter Date: 08/17/2019  OT End of Session - 08/17/19 1455    Visit Number  3    Number of Visits  6    Date for OT Re-Evaluation  10/19/19    OT Start Time  1430    OT Stop Time  1450    OT Time Calculation (min)  20 min    Activity Tolerance  Patient tolerated treatment well    Behavior During Therapy  [facility] for tasks assessed/performed       Past Medical History:  Diagnosis Date  . Arthritis   . Complication of anesthesia    dizziness  . Hypertension   . Hypothyroidism   . Sleep apnea    cpap  . Thyroid disease     Past Surgical History:  Procedure Laterality Date  . ABDOMINAL HYSTERECTOMY    . BACK SURGERY     lower middle kyphoplasty  . COLONOSCOPY WITH PROPOFOL N/A 09/21/2015   Procedure: COLONOSCOPY WITH PROPOFOL;  Surgeon: Hulen Luster, MD;  Location: [facility] ENDOSCOPY;  Service: Gastroenterology;  Laterality: N/A;  . JOINT REPLACEMENT     03/19/2016- left knee replacement  . REVERSE SHOULDER ARTHROPLASTY Right 08/25/2018   Procedure: REVERSE SHOULDER ARTHROPLASTY, RIGHT;  Surgeon: Corky Mull, MD;  Location: [facility] ORS;  Service: Orthopedics;  Laterality: Right;  . 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   Procedure: TOTAL KNEE ARTHROPLASTY;  Surgeon: Corky Mull, MD;  Location: [facility] ORS;  Service: Orthopedics;  Laterality: Left;    There were no vitals filed for this visit.  Subjective Assessment - 08/17/19 1454    Subjective   I fell going to the dumpster last Thursday - so got this bruise on my forearm - I did not wear the compression for one day -bruise was bigger than today    Pertinent History  Pt had March 2020 R reverse shoulder replacement - pt  report had some swelling in the arm since then - just do not want to get better- in wrist to elbow - no pain - Dr Roland Rack refer to OT for eval    Patient Stated Goals  Want to know what is causing it and get the swelling down    Currently in Pain?  No/denies          LYMPHEDEMA/ONCOLOGY QUESTIONNAIRE - 08/17/19 1436      Right Upper Extremity Lymphedema   10 cm Proximal to Olecranon Process  34.5 cm    Olecranon Process  27.5 cm    15 cm Proximal to Ulnar Styloid Process  24.8 cm    10 cm Proximal to Ulnar Styloid Process  21.8 cm    Just Proximal to Ulnar Styloid Process  17.4 cm    Across Hand at [employer]  18.5 cm    At [place] of 2nd Digit  5.8 cm    At [facility] of Thumb  5.8 cm       Measurements about same in distal forearm and wrist because of bruise on volar wrist area  - had fall last Thursday     circumference assess - see flowsheet and compare - did decrease some proximal forearm and elbow with doing MLD  Review again  Pt ed on doing MLD to R arm  To do in am and pm in supine with shoulder 90 degrees flexion   - Pump up the outside of left upper arm 15 reps over shoulder   -Pump top of forearm from wrist to elbow 15 times - Pump up the outside of left upper arm 15  times - Pump up the back of the forearm from wrist to elbow 15 times - Pump up the outside of left upper arm 15 times   Pt to cont with isotoner glove for R hand - and  New  tubigrip D cut to wear hand to elbow  But take off for ADL's and in the am and pm for 1-2 hrs  Will reassess in 3 wks                OT Education - 08/17/19 1455    Education Details  progress and HEP    Person(s) Educated  Patient    Methods  Explanation;Demonstration;Tactile cues;Verbal cues;Handout    Comprehension  Verbal cues required;Returned demonstration;Verbalized understanding          OT Long Term Goals - 08/17/19 1459      OT LONG TERM GOAL #1   Title  Pt R UE/wrist and forearm  circumference decrease by 1 cm    Baseline  wrist increase by 1.4 cm and forearm 1.2 cm  - no pain or decrease ROM or strength - did decrease but had fall and bruised forearm - wrist and distal forearm still increase less than 1 cm    Time  9    Period  Weeks    Status  On-going    Target Date  10/19/19            Plan - 08/17/19 1457    OT Occupational Profile and History  Problem Focused Assessment - Including review of records relating to presenting problem    Occupational performance deficits (Please refer to evaluation for details):  ADL's    Body Structure / Function / Physical Skills  ADL;Edema    Rehab Potential  Fair    Clinical Decision Making  Limited treatment options, no task modification necessary    Comorbidities Affecting Occupational Performance:  May have comorbidities impacting occupational performance    Modification or Assistance to Complete Evaluation   No modification of tasks or assist necessary to complete eval    OT Frequency  --   every 3 wks   OT Duration  --   9wks   OT Treatment/Interventions  Self-care/ADL training;Patient/family education;Compression bandaging;Manual Therapy    Plan  assess progress with Homeprogram    Consulted and Agree with Plan of Care  Patient       Patient will benefit from skilled therapeutic intervention in order to improve the following deficits and impairments:   Body Structure / Function / Physical Skills: ADL, Edema       Visit Diagnosis: Localized edema - Plan: Ot plan of care cert/re-cert    Problem List Patient Active Problem List   Diagnosis Date Noted  . 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 Chronic left-sided low back pain with left-sided sciatica 06/22/2018  . Vitamin B 12 deficiency 01/02/2018  . Chronic anemia 09/06/2017  . Chronic constipation 06/08/2017  . Lumbar radiculopathy 05/22/2017  . Spinal stenosis of lumbosacral region 03/19/2017  . 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 Acute bilateral low back pain without sciatica  02/04/2017  . Dysuria 12/13/2016  . Prediabetes 11/05/2016  . Acquired hypothyroidism 08/24/2016  . Myofascial pain syndrome 08/24/2016  . Essential hypertension 08/24/2016  . Osteopenia of multiple sites 08/24/2016  . Lumbar degenerative disc disease 08/24/2016  . Bilateral hydronephrosis 07/15/2016  . Rotator cuff tendinitis, left 06/21/2016  . 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 Rotator cuff tendinitis, right 11/27/2015  . 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  . Injury of tendon of long head of right biceps 11/27/2015  . Incomplete tear of right rotator cuff 11/27/2015  . Obstructive sleep apnea on CPAP 07/13/2015  . Osteoarthritis 11/04/2013  . Disorder of uterus 04/29/1995    Rosalyn Gess OTR/L,CLT 08/17/2019, 3:03 PM  [place] [facility] [address], [place], [address] Phone: [phone]   Fax:  [phone]  Name: Erika Crawford MRN: [id number] Date of Birth: 05/01/1941

## 2019-08-17 NOTE — Patient Instructions (Signed)
Cont same

## 2019-08-24 ENCOUNTER — Other Ambulatory Visit (HOSPITAL_COMMUNITY): Payer: Self-pay | Admitting: Acute Care

## 2019-08-24 ENCOUNTER — Other Ambulatory Visit: Payer: Self-pay | Admitting: Acute Care

## 2019-08-24 DIAGNOSIS — R42 Dizziness and giddiness: Secondary | ICD-10-CM

## 2019-09-02 ENCOUNTER — Ambulatory Visit
Admission: RE | Admit: 2019-09-02 | Discharge: 2019-09-02 | Disposition: A | Discharge status: Home / Self Care | Payer: Medicare Other | Source: Ambulatory Visit | Attending: Acute Care | Admitting: Acute Care

## 2019-09-02 ENCOUNTER — Other Ambulatory Visit: Payer: Self-pay

## 2019-09-02 DIAGNOSIS — R42 Dizziness and giddiness: Secondary | ICD-10-CM | POA: Insufficient documentation

## 2019-09-07 ENCOUNTER — Ambulatory Visit: Payer: Medicare Other | Admitting: Occupational Therapy

## 2019-09-07 ENCOUNTER — Other Ambulatory Visit: Payer: Self-pay

## 2019-09-07 DIAGNOSIS — R600 Localized edema: Secondary | ICD-10-CM

## 2019-09-07 NOTE — Patient Instructions (Signed)
See note

## 2019-09-07 NOTE — Therapy (Signed)
[place] [facility] [address] [place], [address] Phone: [phone]   Fax:  [phone]  Occupational Therapy Treatment  Patient Details  Name: Erika Crawford MRN: [id number] Date of Birth: December 01, 1940 Referring Provider (OT): Dr Roland Rack   Encounter Date: 09/07/2019  OT End of Session - 09/07/19 1318    Visit Number  4    Number of Visits  6    Date for OT Re-Evaluation  10/19/19    OT Start Time  1300    OT Stop Time  1316    OT Time Calculation (min)  16 min    Activity Tolerance  Patient tolerated treatment well    Behavior During Therapy  [facility] for tasks assessed/performed       Past Medical History:  Diagnosis Date  . Arthritis   . Complication of anesthesia    dizziness  . Hypertension   . Hypothyroidism   . Sleep apnea    cpap  . Thyroid disease     Past Surgical History:  Procedure Laterality Date  . ABDOMINAL HYSTERECTOMY    . BACK SURGERY     lower middle kyphoplasty  . COLONOSCOPY WITH PROPOFOL N/A 09/21/2015   Procedure: COLONOSCOPY WITH PROPOFOL;  Surgeon: Hulen Luster, MD;  Location: [facility] ENDOSCOPY;  Service: Gastroenterology;  Laterality: N/A;  . JOINT REPLACEMENT     03/19/2016- left knee replacement  . REVERSE SHOULDER ARTHROPLASTY Right 08/25/2018   Procedure: REVERSE SHOULDER ARTHROPLASTY, RIGHT;  Surgeon: Corky Mull, MD;  Location: [facility] ORS;  Service: Orthopedics;  Laterality: Right;  . 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   Procedure: TOTAL KNEE ARTHROPLASTY;  Surgeon: Corky Mull, MD;  Location: [facility] ORS;  Service: Orthopedics;  Laterality: Left;    There were no vitals filed for this visit.  Subjective Assessment - 09/07/19 1317    Subjective   I am doing okay -think the wrist is maybe still swollen - I had brain MRI because of some dizziness -but turned out good- and now going to have MRI for my L shoulder    Pertinent History  Pt had March 2020 R reverse shoulder  replacement - pt report had some swelling in the arm since then - just do not want to get better- in wrist to elbow - no pain - Dr Roland Rack refer to OT for eval    Patient Stated Goals  Want to know what is causing it and get the swelling down    Currently in Pain?  No/denies          LYMPHEDEMA/ONCOLOGY QUESTIONNAIRE - 09/07/19 1304      Right Upper Extremity Lymphedema   10 cm Proximal to Olecranon Process  34.6 cm    Olecranon Process  27.8 cm    15 cm Proximal to Ulnar Styloid Process  24.5 cm    10 cm Proximal to Ulnar Styloid Process  21.3 cm    Just Proximal to Ulnar Styloid Process  17.4 cm    Across Hand at [employer]  18.2 cm    At [place] of 2nd Digit  5.8 cm    At [facility] of [place]umb  5.8 cm         Measurements come down in R UE at all levels - and in range compare to L UE- but wrist is still increase by 1 cm      circumference assess - see flowsheet and compare   Pt ed on  doing MLD to R arm in the past and can cont with it  To do in am and pm in supine with shoulder 90 degrees flexion  - Pump up the outside of left upper arm 15 reps over shoulder  -Pump top of forearm from wrist to elbow 15times - Pump up the outside of left upper arm15 times - Pump up the back of the forearm from wrist to elbow 15times - Pump up the outside of left upper arm 15times  Replace pt's isotoner glove for R hand /wrist - pt to wear at night time only - can stop wearingtubigrip D for forearm Will reassess in month              OT Education - 09/07/19 1318    Education Details  progress and HEP    Person(s) Educated  Patient    Methods  Explanation;Demonstration;Tactile cues;Verbal cues;Handout    Comprehension  Verbal cues required;Returned demonstration;Verbalized understanding          OT Long Term Goals - 08/17/19 1459      OT LONG TERM GOAL #1   Title  Pt R UE/wrist and forearm circumference decrease by 1 cm    Baseline  wrist increase by 1.4 cm and  forearm 1.2 cm  - no pain or decrease ROM or strength - did decrease but had fall and bruised forearm - wrist and distal forearm still increase less than 1 cm    Time  9    Period  Weeks    Status  On-going    Target Date  10/19/19            Plan - 09/07/19 1319    Clinical Impression Statement  Pt showed some decongesting of R UE since started - now only increase at wrist about 1 cm - no pain - at this time has pt stop wearing tubigrip and only isotoner glove  at night time and will reassess in month    OT Occupational Profile and History  Problem Focused Assessment - Including review of records relating to presenting problem    Occupational performance deficits (Please refer to evaluation for details):  ADL's    Body Structure / Function / Physical Skills  ADL;Edema    Rehab Potential  Fair    Clinical Decision Making  Limited treatment options, no task modification necessary    Comorbidities Affecting Occupational Performance:  May have comorbidities impacting occupational performance    Modification or Assistance to Complete Evaluation   No modification of tasks or assist necessary to complete eval    OT Frequency  Monthly    OT Duration  6 weeks    OT Treatment/Interventions  Self-care/ADL training;Patient/family education;Compression bandaging;Manual Therapy    Plan  assess progress with Homeprogram    Consulted and Agree with Plan of Care  Patient       Patient will benefit from skilled therapeutic intervention in order to improve the following deficits and impairments:   Body Structure / Function / Physical Skills: ADL, Edema       Visit Diagnosis: Localized edema    Problem List Patient Active Problem List   Diagnosis Date Noted  . 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 Chronic left-sided low back pain with left-sided sciatica 06/22/2018  . Vitamin B 12 deficiency 01/02/2018  . Chronic anemia 09/06/2017  . Chronic constipation  06/08/2017  . Lumbar radiculopathy 05/22/2017  . Spinal stenosis of lumbosacral region 03/19/2017  . 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 Acute bilateral low back pain without sciatica 02/04/2017  . Dysuria 12/13/2016  . Prediabetes 11/05/2016  . Acquired hypothyroidism 08/24/2016  . Myofascial pain syndrome 08/24/2016  . Essential hypertension 08/24/2016  . Osteopenia of multiple sites 08/24/2016  . Lumbar degenerative disc disease 08/24/2016  . Bilateral hydronephrosis 07/15/2016  . Rotator cuff tendinitis, left 06/21/2016  . 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 Rotator cuff tendinitis, right 11/27/2015  . 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  . Injury of tendon of long head of right biceps 11/27/2015  . Incomplete tear of right rotator cuff 11/27/2015  . Obstructive sleep apnea on CPAP 07/13/2015  . Osteoarthritis 11/04/2013  . Disorder of uterus 04/29/1995    Rosalyn Gess OTR/L,CLT 09/07/2019, 1:21 PM  [place] [facility] [address] [place], [address] Phone: [phone]   Fax:  [phone]  Name: MARAI MAROS MRN: [id number] Date of Birth: May 26, 1941

## 2019-09-13 ENCOUNTER — Other Ambulatory Visit: Payer: Self-pay | Admitting: Student

## 2019-09-13 DIAGNOSIS — M7582 Other shoulder lesions, left shoulder: Secondary | ICD-10-CM

## 2019-09-13 DIAGNOSIS — M75112 Incomplete rotator cuff tear or rupture of left shoulder, not specified as traumatic: Secondary | ICD-10-CM

## 2019-09-14 ENCOUNTER — Other Ambulatory Visit: Payer: Self-pay

## 2019-09-14 ENCOUNTER — Ambulatory Visit
Admission: RE | Admit: 2019-09-14 | Discharge: 2019-09-14 | Disposition: A | Discharge status: Home / Self Care | Payer: Medicare Other | Source: Ambulatory Visit | Attending: Student | Admitting: Student

## 2019-09-14 DIAGNOSIS — M75112 Incomplete rotator cuff tear or rupture of left shoulder, not specified as traumatic: Secondary | ICD-10-CM | POA: Diagnosis present

## 2019-09-14 DIAGNOSIS — M7582 Other shoulder lesions, left shoulder: Secondary | ICD-10-CM | POA: Insufficient documentation

## 2019-09-17 ENCOUNTER — Emergency Department
Admission: EM | Admit: 2019-09-17 | Discharge: 2019-09-17 | Disposition: A | Discharge status: Home / Self Care | Payer: Medicare Other | Attending: Emergency Medicine | Admitting: Emergency Medicine

## 2019-09-17 ENCOUNTER — Other Ambulatory Visit: Payer: Self-pay

## 2019-09-17 ENCOUNTER — Emergency Department: Payer: Medicare Other

## 2019-09-17 DIAGNOSIS — Z87891 Personal history of nicotine dependence: Secondary | ICD-10-CM | POA: Diagnosis not present

## 2019-09-17 DIAGNOSIS — Z96611 Presence of right artificial shoulder joint: Secondary | ICD-10-CM | POA: Diagnosis not present

## 2019-09-17 DIAGNOSIS — E039 Hypothyroidism, unspecified: Secondary | ICD-10-CM | POA: Insufficient documentation

## 2019-09-17 DIAGNOSIS — R55 Syncope and collapse: Secondary | ICD-10-CM | POA: Diagnosis not present

## 2019-09-17 DIAGNOSIS — R42 Dizziness and giddiness: Secondary | ICD-10-CM | POA: Diagnosis present

## 2019-09-17 DIAGNOSIS — Z96652 Presence of left artificial knee joint: Secondary | ICD-10-CM | POA: Insufficient documentation

## 2019-09-17 DIAGNOSIS — Z79899 Other long term (current) drug therapy: Secondary | ICD-10-CM | POA: Insufficient documentation

## 2019-09-17 DIAGNOSIS — I10 Essential (primary) hypertension: Secondary | ICD-10-CM | POA: Diagnosis not present

## 2019-09-17 LAB — CBC
HCT: 40.1 % (ref 36.0–46.0)
Hemoglobin: 13.2 g/dL (ref 12.0–15.0)
MCH: 32.1 pg (ref 26.0–34.0)
MCHC: 32.9 g/dL (ref 30.0–36.0)
MCV: 97.6 fL (ref 80.0–100.0)
Platelets: 188 10*3/uL (ref 150–400)
RBC: 4.11 MIL/uL (ref 3.87–5.11)
RDW: 13.8 % (ref 11.5–15.5)
WBC: 5.6 10*3/uL (ref 4.0–10.5)
nRBC: 0 % (ref 0.0–0.2)

## 2019-09-17 LAB — BASIC METABOLIC PANEL
Anion gap: 8 (ref 5–15)
BUN: 15 mg/dL (ref 8–23)
CO2: 26 mmol/L (ref 22–32)
Calcium: 8.8 mg/dL — ABNORMAL LOW (ref 8.9–10.3)
Chloride: 107 mmol/L (ref 98–111)
Creatinine, Ser: 0.67 mg/dL (ref 0.44–1.00)
GFR calc Af Amer: >60 mL/min (ref >60)
GFR calc non Af Amer: >60 mL/min (ref >60)
Glucose, Bld: 101 mg/dL — ABNORMAL HIGH (ref 70–99)
Potassium: 3.5 mmol/L (ref 3.5–5.1)
Sodium: 141 mmol/L (ref 135–145)

## 2019-09-17 LAB — TROPONIN I (HIGH SENSITIVITY): Troponin I (High Sensitivity): 4 ng/L (ref <18)

## 2019-09-17 NOTE — ED Notes (Signed)
Pt returned from xray

## 2019-09-17 NOTE — ED Notes (Signed)
Pt provided with 2 warm blankets, visualized in NAD. Call bell remains within reach. Denies further needs.

## 2019-09-17 NOTE — ED Triage Notes (Signed)
Pt c/o feeling like she is going to pass out, pt stated "feels like my breath is cutting off and it goes with that". Pt stated that the feeling comes and goes.

## 2019-09-17 NOTE — ED Notes (Signed)
Pt transported to xray 

## 2019-09-17 NOTE — ED Notes (Signed)
Pt instructed to call for a ride at this time.

## 2019-09-17 NOTE — ED Provider Notes (Signed)
[facility] Emergency Department Provider Note   ____________________________________________    I have reviewed the triage vital signs and the nursing notes.   HISTORY  Chief Complaint Dizziness     HPI Erika Crawford is a 79 y.o. female with a history as noted below who presents with complaints of intermittent episodes of dizziness for some time.  She reports this seems to be random and she describes it as lightheadedness.  It last happened yesterday and she felt "woozy ", after resting she felt better.  This can happen anytime of day.  She is suspicious that it may be related to her medications.  She denies chest pain or palpitations.  No shortness of breath.  No nausea vomiting or diaphoresis.  No new medications recently.  Does not smoke.  Has felt well this morning, no lightheadedness  Past Medical History:  Diagnosis Date  . Arthritis   . Complication of anesthesia    dizziness  . Hypertension   . Hypothyroidism   . Sleep apnea    cpap  . Thyroid disease     Patient Active Problem List   Diagnosis Date Noted  . 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 Chronic left-sided low back pain with left-sided sciatica 06/22/2018  . Vitamin B 12 deficiency 01/02/2018  . Chronic anemia 09/06/2017  . Chronic constipation 06/08/2017  . Lumbar radiculopathy 05/22/2017  . Spinal stenosis of lumbosacral region 03/19/2017  . 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 Acute bilateral low back pain without sciatica 02/04/2017  . Dysuria 12/13/2016  . Prediabetes 11/05/2016  . Acquired hypothyroidism 08/24/2016  . Myofascial pain syndrome 08/24/2016  . Essential hypertension 08/24/2016  . Osteopenia of multiple sites 08/24/2016  . Lumbar degenerative disc disease 08/24/2016  . Bilateral hydronephrosis 07/15/2016  . Rotator cuff tendinitis, left 06/21/2016  . Status post total knee replacement using  cement, 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  . Rotator cuff tendinitis, right 11/27/2015  . 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  . Injury of tendon of long head of right biceps 11/27/2015  . Incomplete tear of right rotator cuff 11/27/2015  . Obstructive sleep apnea on CPAP 07/13/2015  . Osteoarthritis 11/04/2013  . Disorder of uterus 04/29/1995    Past Surgical History:  Procedure Laterality Date  . ABDOMINAL HYSTERECTOMY    . BACK SURGERY     lower middle kyphoplasty  . COLONOSCOPY WITH PROPOFOL N/A 09/21/2015   Procedure: COLONOSCOPY WITH PROPOFOL;  Surgeon: Hulen Luster, MD;  [facility];  Service: Gastroenterology;  Laterality: N/A;  . JOINT REPLACEMENT     03/19/2016- left knee replacement  . REVERSE SHOULDER ARTHROPLASTY Right 08/25/2018   Procedure: REVERSE SHOULDER ARTHROPLASTY, RIGHT;  Surgeon: Corky Mull, MD;  Location: [facility] ORS;  Service: Orthopedics;  Laterality: Right;  . 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   Procedure: TOTAL KNEE ARTHROPLASTY;  Surgeon: Corky Mull, MD;  Location: [facility] ORS;  Service: Orthopedics;  Laterality: Left;    Prior to Admission medications   Medication Sig Start Date End Date Taking? Authorizing Provider  Acetaminophen 325 MG CAPS Take 1,000 mg by mouth as needed.    [provider]  alendronate (FOSAMAX) 70 MG tablet Take 70 mg by mouth every Wednesday. Take with a full glass of water on an empty stomach.     [provider]  amLODipine (NORVASC) 5 MG tablet Take 5 mg by mouth daily.    [provider]  ASPERCREME LIDOCAINE EX Apply 1 application topically daily as needed (pain).    [provider]  cholecalciferol (VITAMIN D3) 25 MCG (1000 UT) tablet Take 2,000 Units by mouth daily.    [provider]  cyclobenzaprine (FLEXERIL) 10 MG tablet Take 1 tablet by mouth 3 (three) times daily as needed. 01/01/19   [provider]  gabapentin (NEURONTIN) 300 MG capsule 300 mg qday, 300 mg  qPM, 600 mg qhs Patient not taking: Reported on 08/19/2018 07/14/18   [name], MD  Homeopathic Products ([place] EX) Apply 1 application topically daily as needed (pain).    [provider]  HYDROcodone-acetaminophen (NORCO/VICODIN) 5-325 MG tablet Take 1-2 tablets by mouth every 4 (four) hours as needed for moderate pain (pain score 4-6). 08/26/18   Duanne Guess, PA-C  levothyroxine (SYNTHROID, LEVOTHROID) 88 MCG tablet Take 88 mcg by mouth daily before breakfast.     [provider]  Magnesium 250 MG TABS Take 250 mg by mouth 2 (two) times daily.    [provider]  magnesium oxide (MAG-OX) 400 MG tablet Take 250 mg by mouth daily.    [provider]  meloxicam (MOBIC) 15 MG tablet Take 15 mg by mouth.    [provider]  Menthol, Topical Analgesic, (BIOFREEZE EX) Apply 1 application topically daily as needed (pain).    [provider]  Menthol, Topical Analgesic, ([place] ARTHRITIS EX) Apply 1 application topically daily as needed (pain).    [provider]  mometasone (NASONEX) 50 MCG/ACT nasal spray Place 2 sprays into the nose daily as needed (allergies).     [provider]  Naphazoline-Pheniramine (OPCON-A OP) Place 1 drop into both eyes daily as needed (allergies).    [provider]  olmesartan (BENICAR) 40 MG tablet Take 40 mg by mouth daily. 06/24/17   [provider]  omeprazole (PRILOSEC) 20 MG capsule Take 20 mg by mouth daily.    [provider]  ondansetron (ZOFRAN-ODT) 4 MG disintegrating tablet Take 1 tablet (4 mg total) by mouth every 8 (eight) hours as needed. 02/04/19   Fisher, Linden Dolin, PA-C  predniSONE (STERAPRED UNI-PAK 21 TAB) 10 MG (21) TBPK tablet Take 6 pills on day one then decrease by 1 pill each day Patient not taking: Reported on 06/28/2019 02/04/19   Versie Starks, PA-C  traZODone (DESYREL) 50 MG tablet Take 50 mg by mouth at bedtime. 02/01/19 02/01/20   [provider]     Allergies Celebrex [celecoxib], Codeine, Etodolac, Hydrochlorothiazide, Oxycodone, Pravastatin, Relafen [nabumetone], and Tramadol  Family History  Problem Relation Age of Onset  . Breast cancer Neg Hx     Social History Social History   Tobacco Use  . Smoking status: Former Research scientist (life sciences)  . Smokeless tobacco: Never Used  . Tobacco comment: quit 30 years ago  Substance Use Topics  . Alcohol use: Yes    Comment: very little  . Drug use: No    Review of Systems  Constitutional: No fever/chills Eyes: No visual changes.  ENT: No sore throat. Cardiovascular: As above Respiratory: As above Gastrointestinal: No abdominal pain.  No nausea, no vomiting.   Genitourinary: Negative for dysuria. Musculoskeletal: Negative for back pain. Skin: Negative for rash. Neurological: Negative for headaches or weakness   ____________________________________________   PHYSICAL EXAM:  VITAL SIGNS: ED Triage Vitals  Enc Vitals Group     BP 09/17/19 0738 132/85     Pulse Rate 09/17/19 0738 94  Resp 09/17/19 0738 16     Temp 09/17/19 0738 98.9 F (37.2 C)     Temp Source 09/17/19 0754 Oral     SpO2 09/17/19 0738 97 %     Weight 09/17/19 0738 93 kg (205 lb)     Height 09/17/19 0738 1.702 m (5\' 7" )     Head Circumference --      Peak Flow --      Pain Score 09/17/19 0755 0     Pain Loc --      Pain Edu? --      Excl. in [place]? --     Constitutional: Alert and oriented. No acute distress.  Eyes: Conjunctivae are normal.  Nose: No congestion/rhinnorhea. Mouth/Throat: Mucous membranes are moist.   Neck:  Painless ROM Cardiovascular: Normal rate, regular rhythm. Grossly normal heart sounds.  Good peripheral circulation.  Equal pulses bilaterally Respiratory: Normal respiratory effort.  No retractions. Lungs CTAB. Gastrointestinal: Soft and nontender. No distention.    Musculoskeletal: No lower extremity tenderness nor edema.  Warm and well  perfused Neurologic:  Normal speech and language. No gross focal neurologic deficits are appreciated.  Skin:  Skin is warm, dry and intact. No rash noted. Psychiatric: Mood and affect are normal. Speech and behavior are normal.  ____________________________________________   LABS (all labs ordered are listed, but only abnormal results are displayed)  Labs Reviewed  BASIC METABOLIC PANEL - Abnormal; Notable for the following components:      Result Value   Glucose, Bld 101 (*)    Calcium 8.8 (*)    All other components within normal limits  CBC  TROPONIN I (HIGH SENSITIVITY)   ____________________________________________  EKG  ED ECG REPORT I, Lavonia Drafts, the attending physician, personally viewed and interpreted this ECG.  Date: 09/17/2019  Rhythm: normal sinus rhythm QRS Axis: normal Intervals: normal ST/T Wave abnormalities: normal Narrative Interpretation: no evidence of acute ischemia  ____________________________________________  RADIOLOGY  Chest x-ray viewed by me is unremarkable, clear lungs ____________________________________________   PROCEDURES  Procedure(s) performed: yes  .1-3 Lead EKG Interpretation Performed by: Lavonia Drafts, MD Authorized by: Lavonia Drafts, MD     Interpretation: normal     ECG rate:  80   ECG rate assessment: normal     Rhythm: sinus rhythm     Ectopy: none     Conduction: normal       Critical Care performed: No ____________________________________________   INITIAL IMPRESSION / ASSESSMENT AND PLAN / ED COURSE  Pertinent labs & imaging results that were available during my care of the patient were reviewed by me and considered in my medical decision making (see chart for details).  Patient presents with intermittent lightheadedness which seems to have been ongoing for some time.  There is no discernible pattern per the patient, it can happen in the morning and in the evening.  Differential includes  medication side effect, low blood pressure, hypoglycemia, arrhythmia, less likely ACS given no chest pain.  Currently feels well, EKG is reassuring.  We will check electrolytes, blood counts, cardiac enzymes, chest x-ray to evaluate heart size and to rule out pneumonia.  Will place on the cardiac monitor to evaluate for arrhythmia  No evidence of arrhythmia on monitor  Electrolytes, CBC, cardiac enzymes normal.  Chest x-ray is unremarkable.  I have referred the patient to cardiology for further evaluation for intermittent episodes of near syncope.  Recommend rest, increased hydration       FINAL CLINICAL IMPRESSION(S) / ED DIAGNOSES  Final diagnoses:  Near syncope        Note:  This document was prepared using Systems analyst and may include unintentional dictation errors.   Lavonia Drafts, MD 09/17/19 1310

## 2019-09-17 NOTE — ED Notes (Signed)
NAD noted at time of D/C. Pt taken to lobby via wheelchair by [employer], RN. Pt denies comments/concerns regarding D/C instructions. Pt states understanding regarding f/u with cardiologist. Verbal consent for D/C obtained by Meagan, RN.

## 2019-09-27 ENCOUNTER — Ambulatory Visit
Admission: RE | Admit: 2019-09-27 | Discharge: 2019-09-27 | Disposition: A | Discharge status: Home / Self Care | Payer: Medicare Other | Source: Ambulatory Visit | Attending: Internal Medicine | Admitting: Internal Medicine

## 2019-09-27 DIAGNOSIS — Z1231 Encounter for screening mammogram for malignant neoplasm of breast: Secondary | ICD-10-CM | POA: Diagnosis present

## 2019-10-05 ENCOUNTER — Other Ambulatory Visit: Payer: Self-pay

## 2019-10-05 ENCOUNTER — Ambulatory Visit: Payer: Medicare Other | Attending: Surgery | Admitting: Occupational Therapy

## 2019-10-05 DIAGNOSIS — R600 Localized edema: Secondary | ICD-10-CM

## 2019-10-05 NOTE — Therapy (Signed)
[place] [facility] [address], [place], [address] Phone: [phone]   Fax:  [phone]  Occupational Therapy Treatment  Patient Details  Name: Erika Crawford MRN: [id number] Date of Birth: 15-Jul-1940 Referring Provider (OT): Dr Roland Rack   Encounter Date: 10/05/2019  OT End of Session - 10/05/19 1322    Visit Number  5    Number of Visits  5    Date for OT Re-Evaluation  10/05/19    OT Start Time  1301    OT Stop Time  1316    OT Time Calculation (min)  15 min    Activity Tolerance  Patient tolerated treatment well    Behavior During Therapy  [facility] for tasks assessed/performed       Past Medical History:  Diagnosis Date  . Arthritis   . Complication of anesthesia    dizziness  . Hypertension   . Hypothyroidism   . Sleep apnea    cpap  . Thyroid disease     Past Surgical History:  Procedure Laterality Date  . ABDOMINAL HYSTERECTOMY    . BACK SURGERY     lower middle kyphoplasty  . COLONOSCOPY WITH PROPOFOL N/A 09/21/2015   Procedure: COLONOSCOPY WITH PROPOFOL;  Surgeon: Hulen Luster, MD;  Location: [facility] ENDOSCOPY;  Service: Gastroenterology;  Laterality: N/A;  . JOINT REPLACEMENT     03/19/2016- left knee replacement  . REVERSE SHOULDER ARTHROPLASTY Right 08/25/2018   Procedure: REVERSE SHOULDER ARTHROPLASTY, RIGHT;  Surgeon: Corky Mull, MD;  Location: [facility] ORS;  Service: Orthopedics;  Laterality: Right;  . 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   Procedure: TOTAL KNEE ARTHROPLASTY;  Surgeon: Corky Mull, MD;  Location: [facility] ORS;  Service: Orthopedics;  Laterality: Left;    There were no vitals filed for this visit.  Subjective Assessment - 10/05/19 1320    Subjective   I wore the compression glove only at night time like you told me - did not wear anything on my arm or the massage- I think my arm looks about the same now than my other one - did get 2 shots in my L  shoulder - feels better    Pertinent History  Pt had March 2020 R reverse shoulder replacement - pt report had some swelling in the arm since then - just do not want to get better- in wrist to elbow - no pain - Dr Roland Rack refer to OT for eval    Patient Stated Goals  Want to know what is causing it and get the swelling down    Currently in Pain?  No/denies          LYMPHEDEMA/ONCOLOGY QUESTIONNAIRE - 10/05/19 1307      Right Upper Extremity Lymphedema   10 cm Proximal to Olecranon Process  34.2 cm    Olecranon Process  27.5 cm    15 cm Proximal to Ulnar Styloid Process  24.3 cm    10 cm Proximal to Ulnar Styloid Process  21 cm    Just Proximal to Ulnar Styloid Process  17.2 cm    Across Hand at [employer]  18.2 cm    At [place] of 2nd Digit  5.8 cm    At [facility] of Thumb  5.8 cm    Other             Measurements came down in R UE at all levels from [facility] - increase only by 0.8 at wrist -  but pt is R hand dominant - back to prior level - was last month year out from R shoulder surgery   circumference assess - see flowsheet and comparison   Replace last time pt's isotoner glove for R hand /wrist - pt to wear it now only as needed             OT Education - 10/05/19 1321    Education Details  discharge instructions    Person(s) Educated  Patient    Methods  Explanation;Demonstration;Tactile cues;Verbal cues    Comprehension  Verbal cues required;Returned demonstration;Verbalized understanding          OT Long Term Goals - 10/05/19 1324      OT LONG TERM GOAL #1   Title  Pt R UE/wrist and forearm circumference decrease by 1 cm    Baseline  Pt at baseline compare to L UE - increase at wrist 0.8 cm - but pt is R hand dominant    Status  Achieved            Plan - 10/05/19 1322    Clinical Impression Statement  Pt's R UE circumference now about the same than L one - only increase 0.8 at wrist -but pt is R hand dominant - pt to only wear isotoner glove at night time as needed - but R UE  decongested great after being somewhat enlarge after her shoulder surgery - pt back to baseline and discharge from OT services    OT Occupational Profile and History  Problem Focused Assessment - Including review of records relating to presenting problem    Occupational performance deficits (Please refer to evaluation for details):  ADL's    Body Structure / Function / Physical Skills  ADL;Edema    Rehab Potential  Fair    Clinical Decision Making  Limited treatment options, no task modification necessary    Comorbidities Affecting Occupational Performance:  May have comorbidities impacting occupational performance    Modification or Assistance to Complete Evaluation   No modification of tasks or assist necessary to complete eval    OT Treatment/Interventions  Self-care/ADL training;Patient/family education;Compression bandaging;Manual Therapy    Consulted and Agree with Plan of Care  Patient       Patient will benefit from skilled therapeutic intervention in order to improve the following deficits and impairments:   Body Structure / Function / Physical Skills: ADL, Edema       Visit Diagnosis: Localized edema    Problem List Patient Active Problem List   Diagnosis Date Noted  . 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 Chronic left-sided low back pain with left-sided sciatica 06/22/2018  . Vitamin B 12 deficiency 01/02/2018  . Chronic anemia 09/06/2017  . Chronic constipation 06/08/2017  . Lumbar radiculopathy 05/22/2017  . Spinal stenosis of lumbosacral region 03/19/2017  . 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 Acute bilateral low back pain without sciatica 02/04/2017  . Dysuria 12/13/2016  . Prediabetes 11/05/2016  . Acquired hypothyroidism 08/24/2016  . Myofascial pain syndrome 08/24/2016  . Essential hypertension 08/24/2016  . Osteopenia of multiple sites 08/24/2016  . Lumbar degenerative disc disease 08/24/2016  .  Bilateral hydronephrosis 07/15/2016  . Rotator cuff tendinitis, left 06/21/2016  . 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 Rotator cuff tendinitis, right 11/27/2015  . 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  . Injury of tendon of long head of right biceps 11/27/2015  .  Incomplete tear of right rotator cuff 11/27/2015  . Obstructive sleep apnea on CPAP 07/13/2015  . Osteoarthritis 11/04/2013  . Disorder of uterus 04/29/1995    Rosalyn Gess  OTR/l,CLT 10/05/2019, 1:25 PM  [place] [facility] [address], [place], [address] Phone: [phone]   Fax:  [phone]  Name: Erika Crawford MRN: [id number] Date of Birth: January 28, 1941

## 2019-11-16 ENCOUNTER — Ambulatory Visit: Payer: Medicare Other

## 2019-11-22 ENCOUNTER — Ambulatory Visit: Payer: Medicare Other

## 2019-11-22 ENCOUNTER — Other Ambulatory Visit: Payer: Self-pay

## 2019-11-22 ENCOUNTER — Ambulatory Visit: Payer: Medicare Other | Attending: Surgery

## 2019-11-22 DIAGNOSIS — R2681 Unsteadiness on feet: Secondary | ICD-10-CM | POA: Insufficient documentation

## 2019-11-22 DIAGNOSIS — M6281 Muscle weakness (generalized): Secondary | ICD-10-CM

## 2019-11-22 DIAGNOSIS — R600 Localized edema: Secondary | ICD-10-CM | POA: Insufficient documentation

## 2019-11-22 NOTE — Patient Instructions (Addendum)
Access Code: [email or web address] URL: [email or web address] Date: 11/22/2019 Prepared by: Roxana Hires  Exercises Seated March with Resistance - 2 x daily - 7 x weekly - 2 sets - 10 reps - 3s hold Seated Hip Abduction with Resistance - 2 x daily - 7 x weekly - 2 sets - 10 reps - 3s hold Mini Squat with Counter Support - 2 x daily - 7 x weekly - 2 sets - 10 reps

## 2019-11-22 NOTE — Therapy (Signed)
[place] MAIN [facility] SERVICES [address] [place], [place], [address] Phone: [phone]   Fax:  [phone]  Physical Therapy Evaluation  Patient Details  Name: Erika Crawford MRN: [id number] Date of Birth: 1941/05/14 Referring Provider (PT): Dr. Melrose Nakayama   Encounter Date: 11/22/2019  PT End of Session - 11/22/19 1510    Visit Number  1    Number of Visits  17    Date for PT Re-Evaluation  01/17/20    Authorization Type  eval: 11/22/19, FOTO completed    PT Start Time  1515    PT Stop Time  1615    PT Time Calculation (min)  60 min    Equipment Utilized During Treatment  Gait belt    Activity Tolerance  Patient tolerated treatment well    Behavior During Therapy  [facility] for tasks assessed/performed       Past Medical History:  Diagnosis Date  . Arthritis   . Complication of anesthesia    dizziness  . Hypertension   . Hypothyroidism   . Sleep apnea    cpap  . Thyroid disease     Past Surgical History:  Procedure Laterality Date  . ABDOMINAL HYSTERECTOMY    . BACK SURGERY     lower middle kyphoplasty  . COLONOSCOPY WITH PROPOFOL N/A 09/21/2015   Procedure: COLONOSCOPY WITH PROPOFOL;  Surgeon: Hulen Luster, MD;  Location: [facility] ENDOSCOPY;  Service: Gastroenterology;  Laterality: N/A;  . JOINT REPLACEMENT     03/19/2016- left knee replacement  . REVERSE SHOULDER ARTHROPLASTY Right 08/25/2018   Procedure: REVERSE SHOULDER ARTHROPLASTY, RIGHT;  Surgeon: Corky Mull, MD;  Location: [facility] ORS;  Service: Orthopedics;  Laterality: Right;  . 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   Procedure: TOTAL KNEE ARTHROPLASTY;  Surgeon: Corky Mull, MD;  Location: [facility] ORS;  Service: Orthopedics;  Laterality: Left;    There were no vitals filed for this visit.   Subjective Assessment - 11/22/19 1453    Subjective  Unsteadiness    Pertinent History  Pt states that she fell at least 6 weeks ago onto bilateral knees and bilateral hands. She has been  having bilateral thigh/hip pain since that time. She reports that before she fell she was having some difficulty with her balance but it has worsened since her fall. She was initially having some dizziness but states that it has improved since she stopped the gabapentin. Frequency of dizziness is currently approximately once every couple months. She feels like she is about to "pass out" and then it goes away. She has not blacked out. Symptoms come on when she is sitting still. No known aggravating factors. "It never occurs because of motion." Denies any true vertigo. She saw cardiology and had a Holter monitored which per cardiology note showed nsr with average rate of 90. There were rare pvcs and frequent pacs (3.16%). There were several funs of svt, longest of which was 29 beats. No pauses. No symptoms reported. She had a brain MRI 09/02/19 which showed no acute or reversible finding. Mild for age chronic small-vessel change of the hemispheric white matter. No evidence of advanced atrophy. No abnormality seen to explain the clinical presentation. Denies history of headaches or migraines. Denies chest pain. Reports intermittent numbness in the R hand. No focal weakness reported in BUE/BLE.    Limitations  Walking    Diagnostic tests  See history    Patient Stated Goals  Improve balance    Currently in  Pain?  No/denies   Increases to 8-9/10 when painful   Pain Location  Hip    Pain Orientation  Right;Left    Pain Descriptors / Indicators  Aching    Pain Type  Chronic pain   Worsened since the fall   Pain Onset  More than a month ago    Pain Frequency  Intermittent           SUBJECTIVE Chief complaint: Onset: Pt states that she fell at least 6 weeks ago onto bilateral knees and bilateral hands. She has been having bilateral thigh/hip pain since that time. She reports that before she fell she was having some difficulty with her balance but it has worsened since her fall. She was initially having  some dizziness but states that it has improved since she stopped the gabapentin. Frequency of dizziness is currently approximately once every couple months. She feels like she is about to "pass out" and then it goes away. She has not blacked out. Symptoms come on when she is sitting still. No known aggravating factors. "It never occurs because of motion." Denies any true vertigo. She saw cardiology and had a Holter monitored which per cardiology note showed nsr with average rate of 90. There were rare pvcs and frequent pacs (3.16%). There were several funs of svt, longest of which was 29 beats. No pauses. No symptoms reported. She had a brain MRI 09/02/19 which showed no acute or reversible finding. Mild for age chronic small-vessel change of the hemispheric white matter. No evidence of advanced atrophy. No abnormality seen to explain the clinical presentation. Denies history of headaches or migraines. Denies chest pain. Reports intermittent numbness in the R hand. No focal weakness reported in BUE/BLE.  Prior history of physical therapy for balance: None, previously seen PT s/p R shoulder and L knee surgery; Follow-up appointment with MD: Pt cannot remember Red flags (bowel/bladder changes, saddle paresthesia, personal history of cancer, chills/fever, night sweats, nausea/vomiting, unrelenting pain) Negative  OBJECTIVE  MUSCULOSKELETAL: Tremor: Absent Bulk: Normal Tone: Normal, no clonus  Posture Forward head and rounded shoulders with flattened low back;  Gait Narrow gait width, Decreased speed and step length  Strength R/L 5/5 Hip flexion 4+/4+ Hip abduction (sitting) 4+/4+ Hip adduction (sitting) 5/5 Knee extension 5/5 Knee flexion 5/5 Ankle Plantarflexion 5/5 Ankle Dorsiflexion UE grossly WFL and symmetrical with respect to strength, she does have some mild limitation in R shoulder flexion AROM;   NEUROLOGICAL:  Mental Status Patient is oriented to person, place and time.   Recent memory is intact.  Remote memory is intact.  Attention span and concentration are intact.  Expressive speech is intact.  Patient's fund of knowledge is within normal limits for educational level.  Cranial Nerves Deferred  Sensation Grossly intact to light touch bilateral UEs/LEs as determined by testing dermatomes C2-T2/L2-S2 respectively Proprioception and hot/cold testing deferred on this date  Reflexes Deferred  Coordination/Cerebellar Finger to Nose: WNL Heel to Shin: WNL Rapid alternating movements: WNL Finger Opposition: WNL Pronator Drift: Mildly positive RUE  FUNCTIONAL OUTCOME MEASURES   Results Comments  BERG 47/56 Fall risk, in need of intervention  TUG 16.1 seconds Fall risk, in need of intervention  5TSTS 13.7 seconds   10 Meter Gait Speed Self-selected: 14.9s = 0.67 m/s; Fastest: 10.7s = 0.93 m/s Below normative values for full community ambulation  ABC Scale 35.6%   FOTO 33 Predicted improvement to 52    POSTURAL CONTROL TESTS   Modified Clinical Test of Sensory Interaction  for Balance    (CTSIB):  CONDITION TIME SWAY  Eyes open, firm surface 30 seconds 1+  Eyes closed, firm surface 30 seconds 3+  Eyes open, foam surface 30 seconds 2+  Eyes closed, foam surface 4 seconds 4+    OCULOMOTOR / VESTIBULAR TESTING: Deferred  BPPV TESTS: Deferred     Objective measurements completed on examination: See above findings.        TREATMENT   Ther-ex  Seated march with green tband resistance 2 x 10 Seated hip abduction with green tband resistance 2 x 10 Mini squat in // bars with BUE support 2 x 10; Pt provided written HEP with instructions about how to perform at home;       PT Education - 11/22/19 1612    Education Details  Plan of care and HEP    Person(s) Educated  Patient    Methods  Explanation;Demonstration;Handout    Comprehension  Verbalized understanding;Returned demonstration       PT Short Term Goals - 11/22/19  1627      PT SHORT TERM GOAL #1   Title  Pt will be independent with HEP in order to improve strength and balance in order to decrease fall risk and improve function at home.    Time  4    Period  Weeks    Status  New    Target Date  12/20/19        PT Long Term Goals - 11/22/19 1628      PT LONG TERM GOAL #1   Title  Pt will improve BERG by at least 3 points in order to demonstrate clinically significant improvement in balance.    Baseline  11/22/19: 47/56    Time  8    Period  Weeks    Status  New    Target Date  01/17/20      PT LONG TERM GOAL #2   Title  Pt will improve ABC by at least 13% in order to demonstrate clinically significant improvement in balance confidence.    Baseline  11/22/19: 35.6%    Time  8    Period  Weeks    Status  New    Target Date  01/17/20      PT LONG TERM GOAL #3   Title  Pt will decrease TUG to below 14 seconds/decrease in order to demonstrate decreased fall risk.    Baseline  11/22/19: 16.1s    Time  8    Period  Weeks    Status  New    Target Date  01/17/20             Plan - 11/22/19 1601    Clinical Impression Statement  Pt is a pleasant 79 year-old female referred for dizziness and difficulty with balance. She had a recent fall and has noticed worsening of her balance since that time. She reports that dizziness has mostly resolved. PT examination reveals deficits in balance as identified by BERG of 47/56 and TUG of 16.1s. She also has low balance confidence scoring 35.6% on ABC. Pt presents with deficits in strength, gait and balance. She will benefit from skilled PT services to address these deficits and decrease risk for future falls.    Personal Factors and Comorbidities  Age;Comorbidity 3+;Past/Current Experience    Comorbidities  HTN, OA, OSA, hypothyroidism    Examination-Activity Limitations  Squat;Stand;Stairs;Transfers    Examination-Participation Restrictions  Church;Community Activity;Cleaning    Stability/Clinical  Decision Making  Evolving/Moderate complexity  Clinical Decision Making  Moderate    Rehab Potential  Good    PT Frequency  2x / week    PT Duration  8 weeks    PT Treatment/Interventions  ADLs/Self Care Home Management;Aquatic Therapy;Canalith Repostioning;Cryotherapy;Electrical Stimulation;Iontophoresis 4mg /ml Dexamethasone;Moist Heat;Traction;Ultrasound;DME Instruction;Gait training;Functional mobility training;Stair training;Therapeutic activities;Therapeutic exercise;Balance training;Neuromuscular re-education;Patient/family education;Manual techniques;Passive range of motion;Dry needling;Vestibular;Spinal Manipulations;Joint Manipulations    PT Next Visit Plan  Review HEP, strength and balance exercises    PT Home Exercise Plan  Medbridge Access Code: [email or web address] (seated clams with tband, seated marching with tband, standing mini squats)    Consulted and Agree with Plan of Care  Patient       Patient will benefit from skilled therapeutic intervention in order to improve the following deficits and impairments:  Abnormal gait, Decreased balance, Decreased strength, Pain  Visit Diagnosis: Unsteadiness on feet - Plan: PT plan of care cert/re-cert  Muscle weakness (generalized) - Plan: PT plan of care cert/re-cert     Problem List Patient Active Problem List   Diagnosis Date Noted  . 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 Chronic left-sided low back pain with left-sided sciatica 06/22/2018  . Vitamin B 12 deficiency 01/02/2018  . Chronic anemia 09/06/2017  . Chronic constipation 06/08/2017  . Lumbar radiculopathy 05/22/2017  . Spinal stenosis of lumbosacral region 03/19/2017  . 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 Acute bilateral low back pain without sciatica 02/04/2017  . Dysuria 12/13/2016  . Prediabetes 11/05/2016  . Acquired hypothyroidism 08/24/2016  . Myofascial pain syndrome 08/24/2016  . Essential hypertension  08/24/2016  . Osteopenia of multiple sites 08/24/2016  . Lumbar degenerative disc disease 08/24/2016  . Bilateral hydronephrosis 07/15/2016  . Rotator cuff tendinitis, left 06/21/2016  . 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 Rotator cuff tendinitis, right 11/27/2015  . 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  . Injury of tendon of long head of right biceps 11/27/2015  . Incomplete tear of right rotator cuff 11/27/2015  . Obstructive sleep apnea on CPAP 07/13/2015  . Osteoarthritis 11/04/2013  . Disorder of uterus 04/29/1995   Phillips Grout PT, DPT, GCS  [place] Ku[place] 11/22/2019, 4:34 PM  [place] MAIN [facility] SERVICES [address] [place], [place], [address] Phone: [phone]   Fax:  [phone]  Name: Erika Crawford MRN: [id number] Date of Birth: [birth year]

## 2019-11-24 ENCOUNTER — Ambulatory Visit: Payer: Medicare Other

## 2019-11-30 ENCOUNTER — Ambulatory Visit: Payer: Medicare Other | Admitting: Physical Therapy

## 2019-11-30 ENCOUNTER — Encounter: Payer: Self-pay | Admitting: Physical Therapy

## 2019-11-30 ENCOUNTER — Other Ambulatory Visit: Payer: Self-pay

## 2019-11-30 ENCOUNTER — Ambulatory Visit: Payer: Medicare Other

## 2019-11-30 DIAGNOSIS — R2681 Unsteadiness on feet: Secondary | ICD-10-CM | POA: Diagnosis not present

## 2019-11-30 DIAGNOSIS — R600 Localized edema: Secondary | ICD-10-CM

## 2019-11-30 DIAGNOSIS — M6281 Muscle weakness (generalized): Secondary | ICD-10-CM

## 2019-11-30 NOTE — Therapy (Signed)
[place] MAIN [facility] SERVICES [address] [place], [place], [address] Phone: [phone]   Fax:  [phone]  Physical Therapy Treatment  Patient Details  Name: Erika Crawford MRN: [id number] Date of Birth: 10/14/1940 Referring Provider (PT): Dr. Melrose Nakayama   Encounter Date: 11/30/2019   PT End of Session - 11/30/19 1356    Visit Number 2    Number of Visits 17    Date for PT Re-Evaluation 01/17/20    Authorization Type eval: 11/22/19, FOTO completed    PT Start Time 0145    PT Stop Time 0225    PT Time Calculation (min) 40 min    Equipment Utilized During Treatment Gait belt    Activity Tolerance Patient tolerated treatment well    Behavior During Therapy [facility] for tasks assessed/performed           Past Medical History:  Diagnosis Date   Arthritis    Complication of anesthesia    dizziness   Hypertension    Hypothyroidism    Sleep apnea    cpap   Thyroid disease     Past Surgical History:  Procedure Laterality Date   ABDOMINAL HYSTERECTOMY     BACK SURGERY     lower middle kyphoplasty   COLONOSCOPY WITH PROPOFOL N/A 09/21/2015   Procedure: COLONOSCOPY WITH PROPOFOL;  Surgeon: Hulen Luster, MD;  Location: [facility] ENDOSCOPY;  Service: Gastroenterology;  Laterality: N/A;   JOINT REPLACEMENT     03/19/2016- left knee replacement   REVERSE SHOULDER ARTHROPLASTY Right 08/25/2018   Procedure: REVERSE SHOULDER ARTHROPLASTY, RIGHT;  Surgeon: Corky Mull, MD;  Location: [facility] ORS;  Service: Orthopedics;  Laterality: Right;   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   Procedure: TOTAL KNEE ARTHROPLASTY;  Surgeon: Corky Mull, MD;  Location: [facility] ORS;  Service: Orthopedics;  Laterality: Left;    There were no vitals filed for this visit.   Subjective Assessment - 11/30/19 1353    Subjective Patient is having 9/10 pain B hips l> R.    Pertinent History Pt states that she fell at least 6 weeks ago onto bilateral knees and bilateral  hands. She has been having bilateral thigh/hip pain since that time. She reports that before she fell she was having some difficulty with her balance but it has worsened since her fall. She was initially having some dizziness but states that it has improved since she stopped the gabapentin. Frequency of dizziness is currently approximately once every couple months. She feels like she is about to "pass out" and then it goes away. She has not blacked out. Symptoms come on when she is sitting still. No known aggravating factors. "It never occurs because of motion." Denies any true vertigo. She saw cardiology and had a Holter monitored which per cardiology note showed nsr with average rate of 90. There were rare pvcs and frequent pacs (3.16%). There were several funs of svt, longest of which was 29 beats. No pauses. No symptoms reported. She had a brain MRI 09/02/19 which showed no acute or reversible finding. Mild for age chronic small-vessel change of the hemispheric white matter. No evidence of advanced atrophy. No abnormality seen to explain the clinical presentation. Denies history of headaches or migraines. Denies chest pain. Reports intermittent numbness in the R hand. No focal weakness reported in BUE/BLE.    Limitations Walking    Diagnostic tests See history    Patient Stated Goals Improve balance    Currently in Pain? Yes  Pain Score 9     Pain Location Hip    Pain Orientation Right;Left    Pain Descriptors / Indicators Aching    Pain Type Chronic pain    Pain Onset More than a month ago    Pain Frequency Constant    Aggravating Factors  standing    Pain Relieving Factors medicine, rest    Effect of Pain on Daily Activities difficutl to activities             Treatment: Ther-ex  Nu-step  x 5 mins  Hip flexion marches  x 10 bilateral Hip abduction x 10 bilateral Hip extension  x 10 bilateral Lunges to step stool x 15 BLE Heel raises x 15 x 2 sets Step ups to 6-inch stool x 20   Tapping from blue foam to 6 inch stool x 10 verbal cues to complete slow and tap heel.  BUE CGA Standing on foam and tapping balloon to mirror feet apart, feet together, feet modified tandem Side stepping in parallel bars without UE support x 10  HEP addition: Corner balance exercises with feet together Patient needs occasional verbal cueing to improve posture and cueing to correctly perform exercises slowly, holding at end of range to increase motor firing of desired muscle to encourage fatigue.                          PT Education - 11/30/19 1355    Education Details HEP    Person(s) Educated Patient    Methods Explanation    Comprehension Verbalized understanding;Returned demonstration            PT Short Term Goals - 11/22/19 1627      PT SHORT TERM GOAL #1   Title Pt will be independent with HEP in order to improve strength and balance in order to decrease fall risk and improve function at home.    Time 4    Period Weeks    Status New    Target Date 12/20/19             PT Long Term Goals - 11/22/19 1628      PT LONG TERM GOAL #1   Title Pt will improve BERG by at least 3 points in order to demonstrate clinically significant improvement in balance.    Baseline 11/22/19: 47/56    Time 8    Period Weeks    Status New    Target Date 01/17/20      PT LONG TERM GOAL #2   Title Pt will improve ABC by at least 13% in order to demonstrate clinically significant improvement in balance confidence.    Baseline 11/22/19: 35.6%    Time 8    Period Weeks    Status New    Target Date 01/17/20      PT LONG TERM GOAL #3   Title Pt will decrease TUG to below 14 seconds/decrease in order to demonstrate decreased fall risk.    Baseline 11/22/19: 16.1s    Time 8    Period Weeks    Status New    Target Date 01/17/20                 Plan - 11/30/19 1356    Clinical Impression Statement Pt performs balance and exercises  in BLE for deficits in   strength, and mobility   Pt was able to perform beginning  exercises today without an increase in pain or  discomfort to further strengthen LE and core musculature in an effort to further progress overall functional mobility and stability with movement.  Pt would continue to benefit from skilled services in order to further strengthen B LE's , as well as further improve mobility.   Personal Factors and Comorbidities Age;Comorbidity 3+;Past/Current Experience    Comorbidities HTN, OA, OSA, hypothyroidism    Examination-Activity Limitations Squat;Stand;Stairs;Transfers    Examination-Participation Restrictions Church;Community Activity;Cleaning    Stability/Clinical Decision Making Evolving/Moderate complexity    Rehab Potential Good    PT Frequency 2x / week    PT Duration 8 weeks    PT Treatment/Interventions ADLs/Self Care Home Management;Aquatic Therapy;Canalith Repostioning;Cryotherapy;Electrical Stimulation;Iontophoresis 4mg /ml Dexamethasone;Moist Heat;Traction;Ultrasound;DME Instruction;Gait training;Functional mobility training;Stair training;Therapeutic activities;Therapeutic exercise;Balance training;Neuromuscular re-education;Patient/family education;Manual techniques;Passive range of motion;Dry needling;Vestibular;Spinal Manipulations;Joint Manipulations    PT Next Visit Plan Review HEP, strength and balance exercises    PT Home Exercise Plan Medbridge Access Code: [email or web address] (seated clams with tband, seated marching with tband, standing mini squats)    Consulted and Agree with Plan of Care Patient           Patient will benefit from skilled therapeutic intervention in order to improve the following deficits and impairments:  Abnormal gait, Decreased balance, Decreased strength, Pain  Visit Diagnosis: Unsteadiness on feet  Muscle weakness (generalized)  Localized edema     Problem List Patient Active Problem List   Diagnosis Date Noted   Status post reverse total shoulder  replacement, right 08/25/2018   Chronic left-sided low back pain with left-sided sciatica 06/22/2018   Vitamin B 12 deficiency 01/02/2018   Chronic anemia 09/06/2017   Chronic constipation 06/08/2017   Lumbar radiculopathy 05/22/2017   Spinal stenosis of lumbosacral region 03/19/2017   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 03/10/2017   Acute bilateral low back pain without sciatica 02/04/2017   Dysuria 12/13/2016   Prediabetes 11/05/2016   Acquired hypothyroidism 08/24/2016   Myofascial pain syndrome 08/24/2016   Essential hypertension 08/24/2016   Osteopenia of multiple sites 08/24/2016   Lumbar degenerative disc disease 08/24/2016   Bilateral hydronephrosis 07/15/2016   Rotator cuff tendinitis, left 06/21/2016   Status post total knee replacement using cement, 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   Rotator cuff tendinitis, right 11/27/2015   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   Injury of tendon of long head of right biceps 11/27/2015   Incomplete tear of right rotator cuff 11/27/2015   Obstructive sleep apnea on CPAP 07/13/2015   Osteoarthritis 11/04/2013   Disorder of uterus 04/29/1995    Alanson Puls, PT DPT 11/30/2019, 1:58 PM  [place] [address] [place], [place], [address] Phone: [phone]   Fax:  [phone]  Name: QUINNIE BARCELO MRN: [id number] Date of Birth: 10/26/1940

## 2019-12-02 ENCOUNTER — Encounter: Payer: Self-pay | Admitting: Physical Therapy

## 2019-12-02 ENCOUNTER — Other Ambulatory Visit: Payer: Self-pay

## 2019-12-02 ENCOUNTER — Ambulatory Visit: Payer: Medicare Other | Admitting: Physical Therapy

## 2019-12-02 ENCOUNTER — Ambulatory Visit: Payer: Medicare Other

## 2019-12-02 DIAGNOSIS — M6281 Muscle weakness (generalized): Secondary | ICD-10-CM

## 2019-12-02 DIAGNOSIS — R2681 Unsteadiness on feet: Secondary | ICD-10-CM | POA: Diagnosis not present

## 2019-12-02 DIAGNOSIS — R600 Localized edema: Secondary | ICD-10-CM

## 2019-12-02 NOTE — Therapy (Signed)
[place] MAIN [facility] SERVICES [address] [place], [place], [address] Phone: [phone]   Fax:  [phone]  Physical Therapy Treatment  Patient Details  Name: Erika Crawford MRN: [id number] Date of Birth: 09-Nov-1940 Referring Provider (PT): Dr. Melrose Nakayama   Encounter Date: 12/02/2019   PT End of Session - 12/02/19 1353    Visit Number 3    Number of Visits 17    Date for PT Re-Evaluation 01/17/20    Authorization Type eval: 11/22/19, FOTO completed    PT Start Time 0145    PT Stop Time 0225    PT Time Calculation (min) 40 min    Equipment Utilized During Treatment Gait belt    Activity Tolerance Patient tolerated treatment well    Behavior During Therapy [facility] for tasks assessed/performed           Past Medical History:  Diagnosis Date  . Arthritis   . Complication of anesthesia    dizziness  . Hypertension   . Hypothyroidism   . Sleep apnea    cpap  . Thyroid disease     Past Surgical History:  Procedure Laterality Date  . ABDOMINAL HYSTERECTOMY    . BACK SURGERY     lower middle kyphoplasty  . COLONOSCOPY WITH PROPOFOL N/A 09/21/2015   Procedure: COLONOSCOPY WITH PROPOFOL;  Surgeon: Hulen Luster, MD;  Location: [facility];  Service: Gastroenterology;  Laterality: N/A;  . JOINT REPLACEMENT     03/19/2016- left knee replacement  . REVERSE SHOULDER ARTHROPLASTY Right 08/25/2018   Procedure: REVERSE SHOULDER ARTHROPLASTY, RIGHT;  Surgeon: Corky Mull, MD;  Location: [facility] ORS;  Service: Orthopedics;  Laterality: Right;  . 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   Procedure: TOTAL KNEE ARTHROPLASTY;  Surgeon: Corky Mull, MD;  Location: [facility] ORS;  Service: Orthopedics;  Laterality: Left;    There were no vitals filed for this visit.   Subjective Assessment - 12/02/19 1352    Subjective Patient is having 9/10 pain B hips l> R.    Pertinent History Pt states that she fell at least 6 weeks ago onto bilateral knees and bilateral  hands. She has been having bilateral thigh/hip pain since that time. She reports that before she fell she was having some difficulty with her balance but it has worsened since her fall. She was initially having some dizziness but states that it has improved since she stopped the gabapentin. Frequency of dizziness is currently approximately once every couple months. She feels like she is about to "pass out" and then it goes away. She has not blacked out. Symptoms come on when she is sitting still. No known aggravating factors. "It never occurs because of motion." Denies any true vertigo. She saw cardiology and had a Holter monitored which per cardiology note showed nsr with average rate of 90. There were rare pvcs and frequent pacs (3.16%). There were several funs of svt, longest of which was 29 beats. No pauses. No symptoms reported. She had a brain MRI 09/02/19 which showed no acute or reversible finding. Mild for age chronic small-vessel change of the hemispheric white matter. No evidence of advanced atrophy. No abnormality seen to explain the clinical presentation. Denies history of headaches or migraines. Denies chest pain. Reports intermittent numbness in the R hand. No focal weakness reported in BUE/BLE.    Limitations Walking    Diagnostic tests See history    Patient Stated Goals Improve balance    Currently in Pain? Yes  Pain Score 9     Pain Location Leg    Pain Orientation Left    Pain Descriptors / Indicators Aching    Pain Type Chronic pain    Pain Onset More than a month ago           Treatment: Neuromuscular Training: Stool: staggered stance, head turns side/side, up/down x 5 reps each, each foot in front; VCs for proper technique and positioning for each exercise  Hurdle: Forward and backward stepping over hurdle x15 each direction, VCs to take big enough steps and to try to increase speed to work on coordination  Side stepping over hurdle 15x each direction  Blue  Foam: Side stepping x10 on blue balance CGA for safety, VCs for taking a big enough step  Airex pad trunk rotation x2 min, CGA for safety, demonstrated difficulty with keeping arms extended and full rotation with head turn Airex pad, balloon tapping to mirror x2 min, supervision for safety with varying directions and speed of balloon, VCs for utilizing both hands and minimizing UE support  BOSU: Lunge to BOSU ball x 10 , cues for going slow and to control the speed  Matrix: Fwd/bwd gait with 22. 5 lbs and CGA, cues for posture and stepping strategies, occasional LOB Side stepping left and right and CGA with cues to slow movement    Pt educated throughout session about proper posture and technique with exercises. Improved exercise technique, movement at target joints, use of target muscles after min to mod verbal, visual, tactile cues. CGA and Min to mod verbal cues used throughout with increased in postural sway and LOB most seen with narrow base of support and while on uneven surfaces.                        PT Education - 12/02/19 1353    Education Details HEP    Person(s) Educated Patient    Methods Explanation    Comprehension Verbalized understanding;Returned demonstration;Verbal cues required;Tactile cues required;Need further instruction            PT Short Term Goals - 11/22/19 1627      PT SHORT TERM GOAL #1   Title Pt will be independent with HEP in order to improve strength and balance in order to decrease fall risk and improve function at home.    Time 4    Period Weeks    Status New    Target Date 12/20/19             PT Long Term Goals - 11/22/19 1628      PT LONG TERM GOAL #1   Title Pt will improve BERG by at least 3 points in order to demonstrate clinically significant improvement in balance.    Baseline 11/22/19: 47/56    Time 8    Period Weeks    Status New    Target Date 01/17/20      PT LONG TERM GOAL #2   Title Pt will  improve ABC by at least 13% in order to demonstrate clinically significant improvement in balance confidence.    Baseline 11/22/19: 35.6%    Time 8    Period Weeks    Status New    Target Date 01/17/20      PT LONG TERM GOAL #3   Title Pt will decrease TUG to below 14 seconds/decrease in order to demonstrate decreased fall risk.    Baseline 11/22/19: 16.1s    Time 8  Period Weeks    Status New    Target Date 01/17/20                 Plan - 12/02/19 1354    Clinical Impression Statement Patient instructed in intermediate strengthening and balance exercise.  Patient requires min Vcs for correct exercise technique including to improve LE control with standing exercise. Patient demonstrates better quad control with SLS tasks with rail assist. Patient would benefit from additional skilled PT intervention to improve balance/gait safety and reduce fall risk.    Personal Factors and Comorbidities Age;Comorbidity 3+;Past/Current Experience    Comorbidities HTN, OA, OSA, hypothyroidism    Examination-Activity Limitations Squat;Stand;Stairs;Transfers    Examination-Participation Restrictions Church;Community Activity;Cleaning    Stability/Clinical Decision Making Evolving/Moderate complexity    Rehab Potential Good    PT Frequency 2x / week    PT Duration 8 weeks    PT Treatment/Interventions ADLs/Self Care Home Management;Aquatic Therapy;Canalith Repostioning;Cryotherapy;Electrical Stimulation;Iontophoresis 4mg /ml Dexamethasone;Moist Heat;Traction;Ultrasound;DME Instruction;Gait training;Functional mobility training;Stair training;Therapeutic activities;Therapeutic exercise;Balance training;Neuromuscular re-education;Patient/family education;Manual techniques;Passive range of motion;Dry needling;Vestibular;Spinal Manipulations;Joint Manipulations    PT Next Visit Plan Review HEP, strength and balance exercises    PT Home Exercise Plan Medbridge Access Code: [email or web address] (seated clams with tband,  seated marching with tband, standing mini squats)    Consulted and Agree with Plan of Care Patient           Patient will benefit from skilled therapeutic intervention in order to improve the following deficits and impairments:  Abnormal gait, Decreased balance, Decreased strength, Pain  Visit Diagnosis: Unsteadiness on feet  Muscle weakness (generalized)  Localized edema     Problem List Patient Active Problem List   Diagnosis Date Noted  . 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 Chronic left-sided low back pain with left-sided sciatica 06/22/2018  . Vitamin B 12 deficiency 01/02/2018  . Chronic anemia 09/06/2017  . Chronic constipation 06/08/2017  . Lumbar radiculopathy 05/22/2017  . Spinal stenosis of lumbosacral region 03/19/2017  . 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 Acute bilateral low back pain without sciatica 02/04/2017  . Dysuria 12/13/2016  . Prediabetes 11/05/2016  . Acquired hypothyroidism 08/24/2016  . Myofascial pain syndrome 08/24/2016  . Essential hypertension 08/24/2016  . Osteopenia of multiple sites 08/24/2016  . Lumbar degenerative disc disease 08/24/2016  . Bilateral hydronephrosis 07/15/2016  . Rotator cuff tendinitis, left 06/21/2016  . 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 Rotator cuff tendinitis, right 11/27/2015  . 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  . Injury of tendon of long head of right biceps 11/27/2015  . Incomplete tear of right rotator cuff 11/27/2015  . Obstructive sleep apnea on CPAP 07/13/2015  . Osteoarthritis 11/04/2013  . Disorder of uterus 04/29/1995    Alanson Puls, [place] DPT 12/02/2019, 1:55 PM  [place] MAIN [facility] SERVICES [address] [place], [place], [address] Phone: [phone]   Fax:  [phone]  Name: Erika Crawford MRN: [id number] Date of Birth:  1941-05-11

## 2019-12-07 ENCOUNTER — Ambulatory Visit: Payer: Medicare Other | Admitting: Physical Therapy

## 2019-12-08 ENCOUNTER — Ambulatory Visit: Payer: Medicare Other

## 2019-12-09 ENCOUNTER — Ambulatory Visit: Payer: Medicare Other | Admitting: Physical Therapy

## 2019-12-09 ENCOUNTER — Encounter: Payer: Self-pay | Admitting: Physical Therapy

## 2019-12-09 ENCOUNTER — Other Ambulatory Visit: Payer: Self-pay

## 2019-12-09 DIAGNOSIS — R600 Localized edema: Secondary | ICD-10-CM

## 2019-12-09 DIAGNOSIS — M6281 Muscle weakness (generalized): Secondary | ICD-10-CM

## 2019-12-09 DIAGNOSIS — R2681 Unsteadiness on feet: Secondary | ICD-10-CM | POA: Diagnosis not present

## 2019-12-09 NOTE — Therapy (Signed)
[place] MAIN [facility] SERVICES [address] [place], [place], [address] Phone: [phone]   Fax:  [phone]  Physical Therapy Treatment  Patient Details  Name: Erika Crawford MRN: [id number] Date of Birth: 1940/07/22 Referring Provider (PT): Dr. Melrose Nakayama   Encounter Date: 12/09/2019   PT End of Session - 12/09/19 1350    Visit Number 4    Number of Visits 17    Date for PT Re-Evaluation 01/17/20    Authorization Type eval: 11/22/19, FOTO completed    PT Start Time 0145    PT Stop Time 0225    PT Time Calculation (min) 40 min    Equipment Utilized During Treatment Gait belt    Activity Tolerance Patient tolerated treatment well    Behavior During Therapy [facility] for tasks assessed/performed           Past Medical History:  Diagnosis Date  . Arthritis   . Complication of anesthesia    dizziness  . Hypertension   . Hypothyroidism   . Sleep apnea    cpap  . Thyroid disease     Past Surgical History:  Procedure Laterality Date  . ABDOMINAL HYSTERECTOMY    . BACK SURGERY     lower middle kyphoplasty  . COLONOSCOPY WITH PROPOFOL N/A 09/21/2015   Procedure: COLONOSCOPY WITH PROPOFOL;  Surgeon: Hulen Luster, MD;  Location: [facility] END[facility]COPY;  Service: Gastroenterology;  Laterality: N/A;  . JOINT REPLACEMENT     03/19/2016- left knee replacement  . REVERSE SHOULDER ARTHROPLASTY Right 08/25/2018   Procedure: REVERSE SHOULDER ARTHROPLASTY, RIGHT;  Surgeon: Corky Mull, MD;  Location: [facility] ORS;  Service: Orthopedics;  Laterality: Right;  . 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   Procedure: TOTAL KNEE ARTHROPLASTY;  Surgeon: Corky Mull, MD;  Location: [facility] ORS;  Service: Orthopedics;  Laterality: Left;    There were no vitals filed for this visit.   Subjective Assessment - 12/09/19 1349    Subjective Patient is having 8/10 pain B hips l> R.    Pertinent History Pt states that she fell at least 6 weeks ago onto bilateral knees and bilateral  hands. She has been having bilateral thigh/hip pain since that time. She reports that before she fell she was having some difficulty with her balance but it has worsened since her fall. She was initially having some dizziness but states that it has improved since she stopped the gabapentin. Frequency of dizziness is currently approximately once every couple months. She feels like she is about to "pass out" and then it goes away. She has not blacked out. Symptoms come on when she is sitting still. No known aggravating factors. "It never occurs because of motion." Denies any true vertigo. She saw cardiology and had a Holter monitored which per cardiology note showed nsr with average rate of 90. There were rare pvcs and frequent pacs (3.16%). There were several funs of svt, longest of which was 29 beats. No pauses. No symptoms reported. She had a brain MRI 09/02/19 which showed no acute or reversible finding. Mild for age chronic small-vessel change of the hemispheric white matter. No evidence of advanced atrophy. No abnormality seen to explain the clinical presentation. Denies history of headaches or migraines. Denies chest pain. Reports intermittent numbness in the R hand. No focal weakness reported in BUE/BLE.    Limitations Walking    Diagnostic tests See history    Patient Stated Goals Improve balance    Currently in Pain? Yes  Pain Score 8     Pain Location Hip    Pain Orientation Right;Left    Pain Descriptors / Indicators Aching    Pain Type Chronic pain    Pain Onset More than a month ago    Aggravating Factors  standing    Pain Relieving Factors medicine, rest    Effect of Pain on Daily Activities hard to do activities           Treatment: Neuromuscular Training: Stool: staggered stance, head turns side/side, up/down x 5 reps each, each foot in front; VCs for proper technique and positioning for each exercise staggered stance, trunk rotation with thera ball VC to keep UE straight and  turn head with trunk  1/2 foam: Lateral weight shift x10 reps each direction, no UE support, CGA for safety with VCs to tap in each direction, controlling the speed Anterior/Posterior weight shift x10 reps, no UE support, CGA for safety with VCs to utilize ankles to shift weight over toes and back to heels  Hurdle: Forward and backward stepping over hurdle x15 each direction, VCs to take big enough steps and to try to increase speed to work on coordination  Side stepping over hurdle 15x each direction  Blue Foam: Side stepping x10 on blue balance CGA for safety, VCs for taking a big enough step  Airex pad trunk rotation x2 min, CGA for safety, demonstrated difficulty with keeping arms extended and full rotation with head turn  BOSU: Lunge to BOSU ball x 10 , cues for going slow and to control the speed  Ankle stretches: Gastroc/ soleus stretch bLE x 30 sec x 2  Hamstring stretch x 30 sec x 3 BLE   Pt educated throughout session about proper posture and technique with exercises. Improved exercise technique, movement at target joints, use of target muscles after min to mod verbal, visual, tactile cues. CGA and Min to mod verbal cues used throughout with increased in postural sway and LOB most seen with narrow base of support and while on uneven surfaces.                         PT Education - 12/09/19 1350    Education Details HEP    Person(s) Educated Patient    Methods Explanation    Comprehension Verbalized understanding;Returned demonstration;Verbal cues required;Tactile cues required;Need further instruction            PT Short Term Goals - 11/22/19 1627      PT SHORT TERM GOAL #1   Title Pt will be independent with HEP in order to improve strength and balance in order to decrease fall risk and improve function at home.    Time 4    Period Weeks    Status New    Target Date 12/20/19             PT Long Term Goals - 11/22/19 1628      PT LONG  TERM GOAL #1   Title Pt will improve BERG by at least 3 points in order to demonstrate clinically significant improvement in balance.    Baseline 11/22/19: 47/56    Time 8    Period Weeks    Status New    Target Date 01/17/20      PT LONG TERM GOAL #2   Title Pt will improve ABC by at least 13% in order to demonstrate clinically significant improvement in balance confidence.    Baseline 11/22/19: 35.6%  Time 8    Period Weeks    Status New    Target Date 01/17/20      PT LONG TERM GOAL #3   Title Pt will decrease TUG to below 14 seconds/decrease in order to demonstrate decreased fall risk.    Baseline 11/22/19: 16.1s    Time 8    Period Weeks    Status New    Target Date 01/17/20                 Plan - 12/09/19 1350    Clinical Impression Statement Patient instructed in intermediate balance challenges. Patient required min-mod VCs for correct positioning; Patient had increased difficulty with dynamic steps and dual task movements especially unsupported. Patient would benefit from additional skilled PT intervention to improve strength, balance.   Personal Factors and Comorbidities Age;Comorbidity 3+;Past/Current Experience    Comorbidities HTN, OA, OSA, hypothyroidism    Examination-Activity Limitations Squat;Stand;Stairs;Transfers    Examination-Participation Restrictions Church;Community Activity;Cleaning    Stability/Clinical Decision Making Evolving/Moderate complexity    Rehab Potential Good    PT Frequency 2x / week    PT Duration 8 weeks    PT Treatment/Interventions ADLs/Self Care Home Management;Aquatic Therapy;Canalith Repostioning;Cryotherapy;Electrical Stimulation;Iontophoresis 4mg /ml Dexamethasone;Moist Heat;Traction;Ultrasound;DME Instruction;Gait training;Functional mobility training;Stair training;Therapeutic activities;Therapeutic exercise;Balance training;Neuromuscular re-education;Patient/family education;Manual techniques;Passive range of motion;Dry  needling;Vestibular;Spinal Manipulations;Joint Manipulations    PT Next Visit Plan Review HEP, strength and balance exercises    PT Home Exercise Plan Medbridge Access Code: [email or web address] (seated clams with tband, seated marching with tband, standing mini squats)    Consulted and Agree with Plan of Care Patient           Patient will benefit from skilled therapeutic intervention in order to improve the following deficits and impairments:  Abnormal gait, Decreased balance, Decreased strength, Pain  Visit Diagnosis: Unsteadiness on feet  Muscle weakness (generalized)  Localized edema     Problem List Patient Active Problem List   Diagnosis Date Noted  . 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 Chronic left-sided low back pain with left-sided sciatica 06/22/2018  . Vitamin B 12 deficiency 01/02/2018  . Chronic anemia 09/06/2017  . Chronic constipation 06/08/2017  . Lumbar radiculopathy 05/22/2017  . Spinal stenosis of lumbosacral region 03/19/2017  . 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 Acute bilateral low back pain without sciatica 02/04/2017  . Dysuria 12/13/2016  . Prediabetes 11/05/2016  . Acquired hypothyroidism 08/24/2016  . Myofascial pain syndrome 08/24/2016  . Essential hypertension 08/24/2016  . Osteopenia of multiple sites 08/24/2016  . Lumbar degenerative disc disease 08/24/2016  . Bilateral hydronephrosis 07/15/2016  . Rotator cuff tendinitis, left 06/21/2016  . 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 Rotator cuff tendinitis, right 11/27/2015  . 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  . Injury of tendon of long head of right biceps 11/27/2015  . Incomplete tear of right rotator cuff 11/27/2015  . Obstructive sleep apnea on CPAP 07/13/2015  . Osteoarthritis 11/04/2013  . Disorder of uterus 04/29/1995    Alanson Puls, [place] DPT 12/09/2019, 1:52 PM  [place] MAIN [facility] SERVICES [address] [place], [place], [address] Phone: [phone]   Fax:  [phone]  Name: Erika Crawford MRN: [id number] Date of Birth: 10-Dec-1940

## 2019-12-14 ENCOUNTER — Ambulatory Visit: Payer: Medicare Other

## 2019-12-14 ENCOUNTER — Encounter: Payer: Self-pay | Admitting: Physical Therapy

## 2019-12-14 ENCOUNTER — Other Ambulatory Visit: Payer: Self-pay

## 2019-12-14 ENCOUNTER — Ambulatory Visit: Payer: Medicare Other | Admitting: Physical Therapy

## 2019-12-14 DIAGNOSIS — R2681 Unsteadiness on feet: Secondary | ICD-10-CM | POA: Diagnosis not present

## 2019-12-14 DIAGNOSIS — R600 Localized edema: Secondary | ICD-10-CM

## 2019-12-14 DIAGNOSIS — M6281 Muscle weakness (generalized): Secondary | ICD-10-CM

## 2019-12-14 NOTE — Therapy (Signed)
[place] MAIN [facility] SERVICES [address] [place], [place], [address] Phone: [phone]   Fax:  [phone]  Physical Therapy Treatment  Patient Details  Name: Erika Crawford MRN: [id number] Date of Birth: 1940-08-07 Referring Provider (PT): Dr. Melrose Nakayama   Encounter Date: 12/14/2019   PT End of Session - 12/14/19 1415    Visit Number 5    Number of Visits 17    Date for PT Re-Evaluation 01/17/20    Authorization Type eval: 11/22/19, FOTO completed    PT Start Time 0145    PT Stop Time 0227    PT Time Calculation (min) 42 min    Equipment Utilized During Treatment Gait belt    Activity Tolerance Patient tolerated treatment well    Behavior During Therapy [facility] for tasks assessed/performed           Past Medical History:  Diagnosis Date  . Arthritis   . Complication of anesthesia    dizziness  . Hypertension   . Hypothyroidism   . Sleep apnea    cpap  . Thyroid disease     Past Surgical History:  Procedure Laterality Date  . ABDOMINAL HYSTERECTOMY    . BACK SURGERY     lower middle kyphoplasty  . COLONOSCOPY WITH PROPOFOL N/A 09/21/2015   Procedure: COLONOSCOPY WITH PROPOFOL;  Surgeon: Hulen Luster, MD;  Location: [facility] ENDOSCOPY;  Service: Gastroenterology;  Laterality: N/A;  . JOINT REPLACEMENT     03/19/2016- left knee replacement  . REVERSE SHOULDER ARTHROPLASTY Right 08/25/2018   Procedure: REVERSE SHOULDER ARTHROPLASTY, RIGHT;  Surgeon: Corky Mull, MD;  Location: [facility] ORS;  Service: Orthopedics;  Laterality: Right;  . 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   Procedure: TOTAL KNEE ARTHROPLASTY;  Surgeon: Corky Mull, MD;  Location: [facility] ORS;  Service: Orthopedics;  Laterality: Left;    There were no vitals filed for this visit.   Subjective Assessment - 12/14/19 1413    Subjective Patient is having 6/10 pain B hips l> R.    Pertinent History Pt states that she fell at least 6 weeks ago onto bilateral knees and bilateral  hands. She has been having bilateral thigh/hip pain since that time. She reports that before she fell she was having some difficulty with her balance but it has worsened since her fall. She was initially having some dizziness but states that it has improved since she stopped the gabapentin. Frequency of dizziness is currently approximately once every couple months. She feels like she is about to "pass out" and then it goes away. She has not blacked out. Symptoms come on when she is sitting still. No known aggravating factors. "It never occurs because of motion." Denies any true vertigo. She saw cardiology and had a Holter monitored which per cardiology note showed nsr with average rate of 90. There were rare pvcs and frequent pacs (3.16%). There were several funs of svt, longest of which was 29 beats. No pauses. No symptoms reported. She had a brain MRI 09/02/19 which showed no acute or reversible finding. Mild for age chronic small-vessel change of the hemispheric white matter. No evidence of advanced atrophy. No abnormality seen to explain the clinical presentation. Denies history of headaches or migraines. Denies chest pain. Reports intermittent numbness in the R hand. No focal weakness reported in BUE/BLE.    Limitations Walking    Diagnostic tests See history    Patient Stated Goals Improve balance    Currently in Pain? Yes  Pain Score 6     Pain Location Hip    Pain Descriptors / Indicators Aching    Pain Type Chronic pain    Pain Radiating Towards hips    Pain Onset More than a month ago    Aggravating Factors  walking    Pain Relieving Factors medicine and rest    Effect of Pain on Daily Activities slow to do activities             Treatment: Gait training without AD up to front door 120 feet, x 4  up and down an incline 100 feet with CGA Reviewed gastro/soleus stretch x 30 sec x 3 BLE Hamstring stretching BLE 30 sec x 3  Lunges to Bosu ball x 15 BLE Foam to 6 inch step x 20  Foam  tapping to TM x 20 , CGA  Pt educated throughout session about proper posture and technique with exercises. Improved exercise technique, movement at target joints, use of target muscles after min to mod verbal, visual, tactile cues.                          PT Education - 12/14/19 1415    Education Details HEP    Person(s) Educated Patient    Methods Explanation    Comprehension Returned demonstration;Verbal cues required;Tactile cues required;Need further instruction            PT Short Term Goals - 11/22/19 1627      PT SHORT TERM GOAL #1   Title Pt will be independent with HEP in order to improve strength and balance in order to decrease fall risk and improve function at home.    Time 4    Period Weeks    Status New    Target Date 12/20/19             PT Long Term Goals - 11/22/19 1628      PT LONG TERM GOAL #1   Title Pt will improve BERG by at least 3 points in order to demonstrate clinically significant improvement in balance.    Baseline 11/22/19: 47/56    Time 8    Period Weeks    Status New    Target Date 01/17/20      PT LONG TERM GOAL #2   Title Pt will improve ABC by at least 13% in order to demonstrate clinically significant improvement in balance confidence.    Baseline 11/22/19: 35.6%    Time 8    Period Weeks    Status New    Target Date 01/17/20      PT LONG TERM GOAL #3   Title Pt will decrease TUG to below 14 seconds/decrease in order to demonstrate decreased fall risk.    Baseline 11/22/19: 16.1s    Time 8    Period Weeks    Status New    Target Date 01/17/20                 Plan - 12/14/19 1416    Clinical Impression Statement Continuous verbal cues and tactile cues needed to correct form with exercises and for posture correction. Patient required min verbal cues to perform exercises for posture and control and required verbal and tactile cues during all activities.  Patient is able to perform strengthening exercises  with reports of minimal hip pain. Patient will benefit from further skilled therapy to return to prior level of function.   Personal Factors and  Comorbidities Age;Comorbidity 3+;Past/Current Experience    Comorbidities HTN, OA, OSA, hypothyroidism    Examination-Activity Limitations Squat;Stand;Stairs;Transfers    Examination-Participation Restrictions Church;Community Activity;Cleaning    Stability/Clinical Decision Making Evolving/Moderate complexity    Rehab Potential Good    PT Frequency 2x / week    PT Duration 8 weeks    PT Treatment/Interventions ADLs/Self Care Home Management;Aquatic Therapy;Canalith Repostioning;Cryotherapy;Electrical Stimulation;Iontophoresis 4mg /ml Dexamethasone;Moist Heat;Traction;Ultrasound;DME Instruction;Gait training;Functional mobility training;Stair training;Therapeutic activities;Therapeutic exercise;Balance training;Neuromuscular re-education;Patient/family education;Manual techniques;Passive range of motion;Dry needling;Vestibular;Spinal Manipulations;Joint Manipulations    PT Next Visit Plan Review HEP, strength and balance exercises    PT Home Exercise Plan Medbridge Access Code: [email or web address] (seated clams with tband, seated marching with tband, standing mini squats)    Consulted and Agree with Plan of Care Patient           Patient will benefit from skilled therapeutic intervention in order to improve the following deficits and impairments:  Abnormal gait, Decreased balance, Decreased strength, Pain  Visit Diagnosis: Unsteadiness on feet  Muscle weakness (generalized)  Localized edema     Problem List Patient Active Problem List   Diagnosis Date Noted  . 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 Chronic left-sided low back pain with left-sided sciatica 06/22/2018  . Vitamin B 12 deficiency 01/02/2018  . Chronic anemia 09/06/2017  . Chronic constipation 06/08/2017  . Lumbar radiculopathy 05/22/2017  . Spinal stenosis  of lumbosacral region 03/19/2017  . 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 Acute bilateral low back pain without sciatica 02/04/2017  . Dysuria 12/13/2016  . Prediabetes 11/05/2016  . Acquired hypothyroidism 08/24/2016  . Myofascial pain syndrome 08/24/2016  . Essential hypertension 08/24/2016  . Osteopenia of multiple sites 08/24/2016  . Lumbar degenerative disc disease 08/24/2016  . Bilateral hydronephrosis 07/15/2016  . Rotator cuff tendinitis, left 06/21/2016  . 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 Rotator cuff tendinitis, right 11/27/2015  . 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  . Injury of tendon of long head of right biceps 11/27/2015  . Incomplete tear of right rotator cuff 11/27/2015  . Obstructive sleep apnea on CPAP 07/13/2015  . Osteoarthritis 11/04/2013  . Disorder of uterus 04/29/1995    Alanson Puls, [place] DPT 12/14/2019, 2:25 PM  [place] MAIN [facility] SERVICES [address] [place], [place], [address] Phone: [phone]   Fax:  [phone]  Name: REILY TRELOAR MRN: [id number] Date of Birth: Aug 26, 1940

## 2019-12-16 ENCOUNTER — Ambulatory Visit: Payer: Medicare Other

## 2019-12-21 ENCOUNTER — Other Ambulatory Visit: Payer: Self-pay

## 2019-12-21 ENCOUNTER — Ambulatory Visit: Payer: Medicare Other | Attending: Surgery

## 2019-12-21 DIAGNOSIS — R600 Localized edema: Secondary | ICD-10-CM | POA: Diagnosis present

## 2019-12-21 DIAGNOSIS — M6281 Muscle weakness (generalized): Secondary | ICD-10-CM | POA: Diagnosis present

## 2019-12-21 DIAGNOSIS — R2681 Unsteadiness on feet: Secondary | ICD-10-CM | POA: Diagnosis present

## 2019-12-21 NOTE — Therapy (Signed)
[place] MAIN [facility] SERVICES [address] [place], [place], [address] Phone: [phone]   Fax:  [phone]  Physical Therapy Treatment  Patient Details  Name: Erika Crawford MRN: [id number] Date of Birth: August 23, 1940 Referring Provider (PT): Dr. Melrose Nakayama   Encounter Date: 12/21/2019   PT End of Session - 12/21/19 1343    Visit Number 6    Number of Visits 17    Date for PT Re-Evaluation 01/17/20    Authorization Type eval: 11/22/19, FOTO completed    PT Start Time 1335    PT Stop Time 1415    PT Time Calculation (min) 40 min    Equipment Utilized During Treatment Gait belt    Activity Tolerance Patient tolerated treatment well;No increased pain    Behavior During Therapy WFL for tasks assessed/performed           Past Medical History:  Diagnosis Date  . Arthritis   . Complication of anesthesia    dizziness  . Hypertension   . Hypothyroidism   . Sleep apnea    cpap  . Thyroid disease     Past Surgical History:  Procedure Laterality Date  . ABDOMINAL HYSTERECTOMY    . BACK SURGERY     lower middle kyphoplasty  . COLONOSCOPY WITH PROPOFOL N/A 09/21/2015   Procedure: COLONOSCOPY WITH PROPOFOL;  Surgeon: Hulen Luster, MD;  Location: [facility] ENDOSCOPY;  Service: Gastroenterology;  Laterality: N/A;  . JOINT REPLACEMENT     03/19/2016- left knee replacement  . REVERSE SHOULDER ARTHROPLASTY Right 08/25/2018   Procedure: REVERSE SHOULDER ARTHROPLASTY, RIGHT;  Surgeon: Corky Mull, MD;  Location: [facility] ORS;  Service: Orthopedics;  Laterality: Right;  . 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   Procedure: TOTAL KNEE ARTHROPLASTY;  Surgeon: Corky Mull, MD;  Location: [facility] ORS;  Service: Orthopedics;  Laterality: Left;    There were no vitals filed for this visit.   Subjective Assessment - 12/21/19 1341    Subjective Pt doing ok. Low key holiday. Feels some improvement with balance, but still has limited confidence.    Pertinent History Pt  reports sustaining a fall beginning of April 2021, landed on bilateral knees and hands. Has since had bilateral thigh/hip pain. Prior to event, had difficulty with balance but subsequently worse. History of dizziness resolved stopping gabapentin. Pt reports dizziness is rare, once every 6-8 weeks, feels like she is about to "pass out" and then it goes away. Pt denies LOC. Symptoms come on whilst sitting. No known aggravating factors. "It never occurs because of motion." Denies any true vertigo. She saw cardiology and had a Holter monitored which per cardiology, study offered no remarkable findings. Report mentioned infrequent PVC, several runs of SVT, longest of which was 29 beats. No pauses. No symptoms reported. She had a brain MRI 09/02/19 with "no abnormality seen to explain the clinical presentation." Denies history of HA, CP. Reports intermittent numbness in the R hand. Denies any focal weakness.    Currently in Pain? No/denies           INTERVENTION THIS DATE: -Cable resisted walking 12.5lb 2x Left, 2x Right, 2x FWD, 2x retro (could use more resistance FWD and back)  - Gait assessment: left compensated trendelenburg with trunk lean left over left foot in mid stance, appears to have more structural valgus on Left at knee (this knee has been replaced)  -Left glute medius, minimus sustained release 8x30sec -Left hip long axis distraction 1x60sec c belt -four square  stepping for accuracy and speed challenge, x10, minGuard assist provided.  *pt reports a favorable response to manual therapy directed to the left hip, less pain sitting and with short distance AMB       PT Short Term Goals - 11/22/19 1627      PT SHORT TERM GOAL #1   Title Pt will be independent with HEP in order to improve strength and balance in order to decrease fall risk and improve function at home.    Time 4    Period Weeks    Status New    Target Date 12/20/19             PT Long Term Goals - 11/22/19 1628       PT LONG TERM GOAL #1   Title Pt will improve BERG by at least 3 points in order to demonstrate clinically significant improvement in balance.    Baseline 11/22/19: 47/56    Time 8    Period Weeks    Status New    Target Date 01/17/20      PT LONG TERM GOAL #2   Title Pt will improve ABC by at least 13% in order to demonstrate clinically significant improvement in balance confidence.    Baseline 11/22/19: 35.6%    Time 8    Period Weeks    Status New    Target Date 01/17/20      PT LONG TERM GOAL #3   Title Pt will decrease TUG to below 14 seconds/decrease in order to demonstrate decreased fall risk.    Baseline 11/22/19: 16.1s    Time 8    Period Weeks    Status New    Target Date 01/17/20                 Plan - 12/21/19 1351    Clinical Impression Statement Pt able to complete entire session as planned with rest breaks provided as needed. Pt maintains high level of focus and motivation. Pt still having difficulty with Left hip pain, waistline from anteromedial to center of left back. Extensive verbal, visual, and tactile cues are provided for most accurate form possible. Author provides minGuard assist when needed for balance interventions. Overall pt continues to make steady progress toward treatment goals.    Personal Factors and Comorbidities Age;Comorbidity 3+;Past/Current Experience    Comorbidities HTN, OA, OSA, hypothyroidism    Examination-Activity Limitations Squat;Stand;Stairs;Transfers    Examination-Participation Restrictions Church;Community Activity;Cleaning    Stability/Clinical Decision Making Evolving/Moderate complexity    Clinical Decision Making Moderate    Rehab Potential Good    PT Frequency 2x / week    PT Duration 8 weeks    PT Treatment/Interventions ADLs/Self Care Home Management;Aquatic Therapy;Canalith Repostioning;Cryotherapy;Electrical Stimulation;Iontophoresis 4mg /ml Dexamethasone;Moist Heat;Traction;Ultrasound;DME Instruction;Gait  training;Functional mobility training;Stair training;Therapeutic activities;Therapeutic exercise;Balance training;Neuromuscular re-education;Patient/family education;Manual techniques;Passive range of motion;Dry needling;Vestibular;Spinal Manipulations;Joint Manipulations    PT Next Visit Plan Review HEP, strength and balance exercises    PT Home Exercise Plan Medbridge Access Code: [email or web address] (seated clams with tband, seated marching with tband, standing mini squats)    Consulted and Agree with Plan of Care Patient           Patient will benefit from skilled therapeutic intervention in order to improve the following deficits and impairments:  Abnormal gait, Decreased balance, Decreased strength, Pain  Visit Diagnosis: Unsteadiness on feet  Muscle weakness (generalized)  Localized edema     Problem List Patient Active Problem List   Diagnosis Date Noted  .  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 Chronic left-sided low back pain with left-sided sciatica 06/22/2018  . Vitamin B 12 deficiency 01/02/2018  . Chronic anemia 09/06/2017  . Chronic constipation 06/08/2017  . Lumbar radiculopathy 05/22/2017  . Spinal stenosis of lumbosacral region 03/19/2017  . 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 Acute bilateral low back pain without sciatica 02/04/2017  . Dysuria 12/13/2016  . Prediabetes 11/05/2016  . Acquired hypothyroidism 08/24/2016  . Myofascial pain syndrome 08/24/2016  . Essential hypertension 08/24/2016  . Osteopenia of multiple sites 08/24/2016  . Lumbar degenerative disc disease 08/24/2016  . Bilateral hydronephrosis 07/15/2016  . Rotator cuff tendinitis, left 06/21/2016  . 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 Rotator cuff tendinitis, right 11/27/2015  . 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  . Injury of tendon of long head of right biceps 11/27/2015  . Incomplete tear of  right rotator cuff 11/27/2015  . Obstructive sleep apnea on CPAP 07/13/2015  . Osteoarthritis 11/04/2013  . Disorder of uterus 04/29/1995   2:18 PM, 12/21/19 Etta Grandchild, PT, DPT Physical Therapist - [place] [facility] 12/21/2019, 1:53 PM  [place] MAIN [facility] SERVICES [address] [place], [place], [address] Phone: [phone]   Fax:  [phone]  Name: DESHAE DICKISON MRN: [id number] Date of Birth: [birth year]

## 2019-12-23 ENCOUNTER — Ambulatory Visit: Payer: Medicare Other | Admitting: Physical Therapy

## 2019-12-28 ENCOUNTER — Encounter: Payer: Self-pay | Admitting: Physical Therapy

## 2019-12-28 ENCOUNTER — Ambulatory Visit: Payer: Medicare Other

## 2019-12-28 ENCOUNTER — Other Ambulatory Visit: Payer: Self-pay

## 2019-12-28 ENCOUNTER — Ambulatory Visit: Payer: Medicare Other | Admitting: Physical Therapy

## 2019-12-28 DIAGNOSIS — R2681 Unsteadiness on feet: Secondary | ICD-10-CM | POA: Diagnosis not present

## 2019-12-28 DIAGNOSIS — R600 Localized edema: Secondary | ICD-10-CM

## 2019-12-28 DIAGNOSIS — M6281 Muscle weakness (generalized): Secondary | ICD-10-CM

## 2019-12-28 NOTE — Therapy (Signed)
[place] MAIN [facility] SERVICES [address] [place], [place], [address] Phone: [phone]   Fax:  [phone]  Physical Therapy Treatment  Patient Details  Name: Erika Crawford MRN: [id number] Date of Birth: [birth year] Referring Provider (PT): Dr. Melrose Nakayama   Encounter Date: 12/28/2019   PT End of Session - 12/28/19 1337    Visit Number 7    Number of Visits 17    Date for PT Re-Evaluation 01/17/20    Authorization Type eval: 11/22/19, FOTO completed    Equipment Utilized During Treatment Gait belt    Activity Tolerance Patient tolerated treatment well;No increased pain    Behavior During Therapy WFL for tasks assessed/performed           Past Medical History:  Diagnosis Date  . Arthritis   . Complication of anesthesia    dizziness  . Hypertension   . Hypothyroidism   . Sleep apnea    cpap  . Thyroid disease     Past Surgical History:  Procedure Laterality Date  . ABDOMINAL HYSTERECTOMY    . BACK SURGERY     lower middle kyphoplasty  . COLONOSCOPY WITH PROPOFOL N/A 09/21/2015   Procedure: COLONOSCOPY WITH PROPOFOL;  Surgeon: Hulen Luster, MD;  Location: [facility] ENDOSCOPY;  Service: Gastroenterology;  Laterality: N/A;  . JOINT REPLACEMENT     03/19/2016- left knee replacement  . REVERSE SHOULDER ARTHROPLASTY Right 08/25/2018   Procedure: REVERSE SHOULDER ARTHROPLASTY, RIGHT;  Surgeon: Corky Mull, MD;  Location: [facility] ORS;  Service: Orthopedics;  Laterality: Right;  . 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   Procedure: TOTAL KNEE ARTHROPLASTY;  Surgeon: Corky Mull, MD;  Location: [facility] ORS;  Service: Orthopedics;  Laterality: Left;    There were no vitals filed for this visit.   Subjective Assessment - 12/28/19 1335    Subjective Pt doing ok. Feels some improvement with balance, but still has limited confidence.    Pertinent History Pt reports sustaining a fall beginning of April 2021, landed on bilateral knees and hands. Has since  had bilateral thigh/hip pain. Prior to event, had difficulty with balance but subsequently worse. History of dizziness resolved stopping gabapentin. Pt reports dizziness is rare, once every 6-8 weeks, feels like she is about to "pass out" and then it goes away. Pt denies LOC. Symptoms come on whilst sitting. No known aggravating factors. "It never occurs because of motion." Denies any true vertigo. She saw cardiology and had a Holter monitored which per cardiology, study offered no remarkable findings. Report mentioned infrequent PVC, several runs of SVT, longest of which was 29 beats. No pauses. No symptoms reported. She had a brain MRI 09/02/19 with "no abnormality seen to explain the clinical presentation." Denies history of HA, CP. Reports intermittent numbness in the R hand. Denies any focal weakness.    Limitations Walking    Diagnostic tests See history    Patient Stated Goals Improve balance    Currently in Pain? Yes    Pain Score 8     Pain Location Hip    Pain Orientation Right;Left    Pain Descriptors / Indicators Aching    Pain Type Chronic pain    Pain Onset More than a month ago    Pain Frequency Constant    Aggravating Factors  walking    Pain Relieving Factors medicine and rest    Effect of Pain on Daily Activities slow to do activities  Neuromuscular Re-education  Hurdle  fwd/bwd, side to side x 20 each direction Tandem on 1/2 foam  with UE support x 20 standing on foam and fwd tapes to stepping stones without UE support x 20 lateral stepping from  foam to stepping stones with minimal  UE support x 20 Heel/toe raises without UE support 3s hold x 10 each 1/2 foam roll balance with flat side up 30s x 2 reps 1/2 foam roll balance with flat side down 30s x 2 reps Lateral side steps from foam to 6 inch stool left and right x 15 Backwards stepping from foam to 6 inch stool x 15  Modified tandem and balloon taps x 20  Patient performed with instruction, verbal cues,  tactile cues of therapist: goal: increase tissue extensibility, promote proper posture, improve mobility                          PT Education - 12/28/19 1337    Education Details HEP    Person(s) Educated Patient    Methods Explanation    Comprehension Returned demonstration;Verbal cues required;Tactile cues required;Need further instruction            PT Short Term Goals - 11/22/19 1627      PT SHORT TERM GOAL #1   Title Pt will be independent with HEP in order to improve strength and balance in order to decrease fall risk and improve function at home.    Time 4    Period Weeks    Status New    Target Date 12/20/19             PT Long Term Goals - 11/22/19 1628      PT LONG TERM GOAL #1   Title Pt will improve BERG by at least 3 points in order to demonstrate clinically significant improvement in balance.    Baseline 11/22/19: 47/56    Time 8    Period Weeks    Status New    Target Date 01/17/20      PT LONG TERM GOAL #2   Title Pt will improve ABC by at least 13% in order to demonstrate clinically significant improvement in balance confidence.    Baseline 11/22/19: 35.6%    Time 8    Period Weeks    Status New    Target Date 01/17/20      PT LONG TERM GOAL #3   Title Pt will decrease TUG to below 14 seconds/decrease in order to demonstrate decreased fall risk.    Baseline 11/22/19: 16.1s    Time 8    Period Weeks    Status New    Target Date 01/17/20                 Plan - 12/28/19 1337    Clinical Impression Statement Pt requires direction and verbal cues for correct performance of gait and strengthening exercises. Patient was instructed  side stepping, backwards ambulation and gait speed variations without AD and CGA. Patient was instructed in weight shifting activities for standing and gait, and  will continue to benefit from skilled PT to improve mobility and dynamic balance.    Personal Factors and Comorbidities Age;Comorbidity  3+;Past/Current Experience    Comorbidities HTN, OA, OSA, hypothyroidism    Examination-Activity Limitations Squat;Stand;Stairs;Transfers    Examination-Participation Restrictions Church;Community Activity;Cleaning    Stability/Clinical Decision Making Evolving/Moderate complexity    Rehab Potential Good    PT Frequency 2x / week  PT Duration 8 weeks    PT Treatment/Interventions ADLs/Self Care Home Management;Aquatic Therapy;Canalith Repostioning;Cryotherapy;Electrical Stimulation;Iontophoresis 4mg /ml Dexamethasone;Moist Heat;Traction;Ultrasound;DME Instruction;Gait training;Functional mobility training;Stair training;Therapeutic activities;Therapeutic exercise;Balance training;Neuromuscular re-education;Patient/family education;Manual techniques;Passive range of motion;Dry needling;Vestibular;Spinal Manipulations;Joint Manipulations    PT Next Visit Plan Review HEP, strength and balance exercises    PT Home Exercise Plan Medbridge Access Code: [email or web address] (seated clams with tband, seated marching with tband, standing mini squats)    Consulted and Agree with Plan of Care Patient           Patient will benefit from skilled therapeutic intervention in order to improve the following deficits and impairments:  Abnormal gait, Decreased balance, Decreased strength, Pain  Visit Diagnosis: Unsteadiness on feet  Muscle weakness (generalized)  Localized edema     Problem List Patient Active Problem List   Diagnosis Date Noted  . 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 Chronic left-sided low back pain with left-sided sciatica 06/22/2018  . Vitamin B 12 deficiency 01/02/2018  . Chronic anemia 09/06/2017  . Chronic constipation 06/08/2017  . Lumbar radiculopathy 05/22/2017  . Spinal stenosis of lumbosacral region 03/19/2017  . 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 Acute bilateral low back pain without sciatica 02/04/2017  . Dysuria  12/13/2016  . Prediabetes 11/05/2016  . Acquired hypothyroidism 08/24/2016  . Myofascial pain syndrome 08/24/2016  . Essential hypertension 08/24/2016  . Osteopenia of multiple sites 08/24/2016  . Lumbar degenerative disc disease 08/24/2016  . Bilateral hydronephrosis 07/15/2016  . Rotator cuff tendinitis, left 06/21/2016  . 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 Rotator cuff tendinitis, right 11/27/2015  . 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  . Injury of tendon of long head of right biceps 11/27/2015  . Incomplete tear of right rotator cuff 11/27/2015  . Obstructive sleep apnea on CPAP 07/13/2015  . Osteoarthritis 11/04/2013  . Disorder of uterus 04/29/1995    Alanson Puls, [place] DPT 12/28/2019, 1:38 PM  [place] MAIN [facility] SERVICES [address] [place], [place], [address] Phone: [phone]   Fax:  [phone]  Name: Erika Crawford MRN: [id number] Date of Birth: April 24, 1941

## 2019-12-30 ENCOUNTER — Ambulatory Visit: Payer: Medicare Other

## 2019-12-30 ENCOUNTER — Ambulatory Visit: Payer: Medicare Other | Admitting: Physical Therapy

## 2020-01-04 ENCOUNTER — Encounter: Payer: Self-pay | Admitting: Physical Therapy

## 2020-01-04 ENCOUNTER — Ambulatory Visit: Payer: Medicare Other

## 2020-01-04 ENCOUNTER — Other Ambulatory Visit: Payer: Self-pay

## 2020-01-04 DIAGNOSIS — R2681 Unsteadiness on feet: Secondary | ICD-10-CM | POA: Diagnosis not present

## 2020-01-04 DIAGNOSIS — M6281 Muscle weakness (generalized): Secondary | ICD-10-CM

## 2020-01-04 DIAGNOSIS — R600 Localized edema: Secondary | ICD-10-CM

## 2020-01-04 NOTE — Therapy (Signed)
[place] MAIN [facility] SERVICES [address] [place], [place], [address] Phone: [phone]   Fax:  [phone]  Physical Therapy Treatment  Patient Details  Name: Erika Crawford MRN: [id number] Date of Birth: January 19, 1941 Referring Provider (PT): Dr. Melrose Nakayama   Encounter Date: 01/04/2020   PT End of Session - 01/04/20 1400    Visit Number 8    Number of Visits 17    Date for PT Re-Evaluation 01/17/20    Authorization Type eval: 11/22/19, FOTO completed    PT Start Time 1400    PT Stop Time 1440    PT Time Calculation (min) 40 min    Equipment Utilized During Treatment Gait belt    Activity Tolerance Patient tolerated treatment well;No increased pain    Behavior During Therapy WFL for tasks assessed/performed           Past Medical History:  Diagnosis Date  . Arthritis   . Complication of anesthesia    dizziness  . Hypertension   . Hypothyroidism   . Sleep apnea    cpap  . Thyroid disease     Past Surgical History:  Procedure Laterality Date  . ABDOMINAL HYSTERECTOMY    . BACK SURGERY     lower middle kyphoplasty  . COLONOSCOPY WITH PROPOFOL N/A 09/21/2015   Procedure: COLONOSCOPY WITH PROPOFOL;  Surgeon: Hulen Luster, MD;  Location: [facility] ENDOSCOPY;  Service: Gastroenterology;  Laterality: N/A;  . JOINT REPLACEMENT     03/19/2016- left knee replacement  . REVERSE SHOULDER ARTHROPLASTY Right 08/25/2018   Procedure: REVERSE SHOULDER ARTHROPLASTY, RIGHT;  Surgeon: Corky Mull, MD;  Location: [facility] ORS;  Service: Orthopedics;  Laterality: Right;  . 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   Procedure: TOTAL KNEE ARTHROPLASTY;  Surgeon: Corky Mull, MD;  Location: [facility] ORS;  Service: Orthopedics;  Laterality: Left;    There were no vitals filed for this visit.   Subjective Assessment - 01/04/20 1403    Subjective Patient stated she is doing okay. no falls since her fall in April.    Pertinent History Pt reports sustaining a fall  beginning of April 2021, landed on bilateral knees and hands. Has since had bilateral thigh/hip pain. Prior to event, had difficulty with balance but subsequently worse. History of dizziness resolved stopping gabapentin. Pt reports dizziness is rare, once every 6-8 weeks, feels like she is about to "pass out" and then it goes away. Pt denies LOC. Symptoms come on whilst sitting. No known aggravating factors. "It never occurs because of motion." Denies any true vertigo. She saw cardiology and had a Holter monitored which per cardiology, study offered no remarkable findings. Report mentioned infrequent PVC, several runs of SVT, longest of which was 29 beats. No pauses. No symptoms reported. She had a brain MRI 09/02/19 with "no abnormality seen to explain the clinical presentation." Denies history of HA, CP. Reports intermittent numbness in the R hand. Denies any focal weakness.    Limitations Walking    Currently in Pain? Yes    Pain Score 8     Pain Location Leg    Pain Orientation Right;Left    Pain Descriptors / Indicators Aching    Pain Type Chronic pain    Pain Onset More than a month ago            Neuromuscular Re-education: CGA throughout Hurdle  fwd/bwd,  x10 ea side, intermittent single UE support, increased need with fatigue side to side over two hurdles  x 20 each direction no UE support Tandem on 1/2 foam  with UE support 2x 30 sec ea foot no UE support Heel/toe raises without UE support 3s hold x 10 each 1/2 foam roll balance with flat side up 30s x 2 reps heel toe touches no Ue support 1/2 foam roll balance with flat side up static holds with one finger support 2 x30sec  1/2 foam roll balance with flat side up static holds feet together 2 x 30sec reps one finger support 1/2 foam roll balance with flat side down feet together 30s x 2 reps no UE support Cable resisted walking 12.5lb 2x Left, 2x Right, 2x FWD, 2x retro (could use more resistance FWD and back)    Patient performed  with instruction, verbal cues, tactile cues of therapist: goal: increase tissue extensibility, promote proper posture, improve mobility  Pt response/clinical impression: Pt reported decreased LE pain at end of session, stated that she felt better than when she started. Pt most challenged by activities that promoted single leg stance due to L knee pain/decreased confidence. The patient would benefit from further skilled PT intervention to continue to progress towards goals.       PT Education - 01/04/20 1359    Education Details therex    Person(s) Educated Patient    Methods Explanation    Comprehension Verbalized understanding;Returned demonstration;Verbal cues required;Tactile cues required;Need further instruction            PT Short Term Goals - 11/22/19 1627      PT SHORT TERM GOAL #1   Title Pt will be independent with HEP in order to improve strength and balance in order to decrease fall risk and improve function at home.    Time 4    Period Weeks    Status New    Target Date 12/20/19             PT Long Term Goals - 11/22/19 1628      PT LONG TERM GOAL #1   Title Pt will improve BERG by at least 3 points in order to demonstrate clinically significant improvement in balance.    Baseline 11/22/19: 47/56    Time 8    Period Weeks    Status New    Target Date 01/17/20      PT LONG TERM GOAL #2   Title Pt will improve ABC by at least 13% in order to demonstrate clinically significant improvement in balance confidence.    Baseline 11/22/19: 35.6%    Time 8    Period Weeks    Status New    Target Date 01/17/20      PT LONG TERM GOAL #3   Title Pt will decrease TUG to below 14 seconds/decrease in order to demonstrate decreased fall risk.    Baseline 11/22/19: 16.1s    Time 8    Period Weeks    Status New    Target Date 01/17/20                 Plan - 01/04/20 1359    Clinical Impression Statement Pt reported decreased LE pain at end of session, stated that  she felt better than when she started. Pt most challenged by activities that promoted single leg stance due to L knee pain/decreased confidence. The patient would benefit from further skilled PT intervention to continue to progress towards goals.    Personal Factors and Comorbidities Age;Comorbidity 3+;Past/Current Experience    Comorbidities HTN, OA, OSA, hypothyroidism  Examination-Activity Limitations Squat;Stand;Stairs;Transfers    Examination-Participation Restrictions Church;Community Activity;Cleaning    Stability/Clinical Decision Making Evolving/Moderate complexity    Rehab Potential Good    PT Frequency 2x / week    PT Duration 8 weeks    PT Treatment/Interventions ADLs/Self Care Home Management;Aquatic Therapy;Canalith Repostioning;Cryotherapy;Electrical Stimulation;Iontophoresis 4mg /ml Dexamethasone;Moist Heat;Traction;Ultrasound;DME Instruction;Gait training;Functional mobility training;Stair training;Therapeutic activities;Therapeutic exercise;Balance training;Neuromuscular re-education;Patient/family education;Manual techniques;Passive range of motion;Dry needling;Vestibular;Spinal Manipulations;Joint Manipulations    PT Next Visit Plan Review HEP, strength and balance exercises    PT Home Exercise Plan Medbridge Access Code: [email or web address] (seated clams with tband, seated marching with tband, standing mini squats)    Consulted and Agree with Plan of Care Patient           Patient will benefit from skilled therapeutic intervention in order to improve the following deficits and impairments:  Abnormal gait, Decreased balance, Decreased strength, Pain  Visit Diagnosis: Unsteadiness on feet  Muscle weakness (generalized)  Localized edema     Problem List Patient Active Problem List   Diagnosis Date Noted  . 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 Chronic left-sided low back pain with left-sided sciatica 06/22/2018  . Vitamin B 12 deficiency  01/02/2018  . Chronic anemia 09/06/2017  . Chronic constipation 06/08/2017  . Lumbar radiculopathy 05/22/2017  . Spinal stenosis of lumbosacral region 03/19/2017  . 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 Acute bilateral low back pain without sciatica 02/04/2017  . Dysuria 12/13/2016  . Prediabetes 11/05/2016  . Acquired hypothyroidism 08/24/2016  . Myofascial pain syndrome 08/24/2016  . Essential hypertension 08/24/2016  . Osteopenia of multiple sites 08/24/2016  . Lumbar degenerative disc disease 08/24/2016  . Bilateral hydronephrosis 07/15/2016  . Rotator cuff tendinitis, left 06/21/2016  . 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 Rotator cuff tendinitis, right 11/27/2015  . 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  . Injury of tendon of long head of right biceps 11/27/2015  . Incomplete tear of right rotator cuff 11/27/2015  . Obstructive sleep apnea on CPAP 07/13/2015  . Osteoarthritis 11/04/2013  . Disorder of uterus 04/29/1995    Lieutenant Diego PT, DPT 2:55 PM,01/04/20   [place] MAIN [facility] SERVICES [address] [place], [place], [address] Phone: [phone]   Fax:  [phone]  Name: Erika Crawford MRN: [id number] Date of Birth: [birth year]

## 2020-01-06 ENCOUNTER — Ambulatory Visit: Payer: Medicare Other

## 2020-01-11 ENCOUNTER — Other Ambulatory Visit: Payer: Self-pay

## 2020-01-11 ENCOUNTER — Encounter: Payer: Self-pay | Admitting: Physical Therapy

## 2020-01-11 ENCOUNTER — Ambulatory Visit: Payer: Medicare Other | Admitting: Physical Therapy

## 2020-01-11 ENCOUNTER — Ambulatory Visit: Payer: Medicare Other

## 2020-01-11 DIAGNOSIS — R2681 Unsteadiness on feet: Secondary | ICD-10-CM | POA: Diagnosis not present

## 2020-01-11 DIAGNOSIS — M6281 Muscle weakness (generalized): Secondary | ICD-10-CM

## 2020-01-11 DIAGNOSIS — R600 Localized edema: Secondary | ICD-10-CM

## 2020-01-11 NOTE — Therapy (Signed)
[place] MAIN [facility] SERVICES [address] [place], [place], [address] Phone: [phone]   Fax:  [phone]  Physical Therapy Treatment  Patient Details  Name: Erika Crawford MRN: [id number] Date of Birth: 1941/05/26 Referring Provider (PT): Dr. Melrose Nakayama   Encounter Date: 01/11/2020   PT End of Session - 01/11/20 1136    Visit Number 9    Number of Visits 17    Date for PT Re-Evaluation 01/17/20    Authorization Type eval: 11/22/19, FOTO completed    PT Start Time 1145    PT Stop Time 1225    PT Time Calculation (min) 40 min    Equipment Utilized During Treatment Gait belt    Activity Tolerance Patient tolerated treatment well;No increased pain    Behavior During Therapy WFL for tasks assessed/performed           Past Medical History:  Diagnosis Date  . Arthritis   . Complication of anesthesia    dizziness  . Hypertension   . Hypothyroidism   . Sleep apnea    cpap  . Thyroid disease     Past Surgical History:  Procedure Laterality Date  . ABDOMINAL HYSTERECTOMY    . BACK SURGERY     lower middle kyphoplasty  . COLONOSCOPY WITH PROPOFOL N/A 09/21/2015   Procedure: COLONOSCOPY WITH PROPOFOL;  Surgeon: Hulen Luster, MD;  Location: [facility] ENDOSCOPY;  Service: Gastroenterology;  Laterality: N/A;  . JOINT REPLACEMENT     03/19/2016- left knee replacement  . REVERSE SHOULDER ARTHROPLASTY Right 08/25/2018   Procedure: REVERSE SHOULDER ARTHROPLASTY, RIGHT;  Surgeon: Corky Mull, MD;  Location: [facility] ORS;  Service: Orthopedics;  Laterality: Right;  . 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   Procedure: TOTAL KNEE ARTHROPLASTY;  Surgeon: Corky Mull, MD;  Location: [facility] ORS;  Service: Orthopedics;  Laterality: Left;    There were no vitals filed for this visit.   Subjective Assessment - 01/11/20 1113    Subjective Patient stated she is doing okay. no falls since her fall in April.    Pertinent History Pt reports sustaining a fall  beginning of April 2021, landed on bilateral knees and hands. Has since had bilateral thigh/hip pain. Prior to event, had difficulty with balance but subsequently worse. History of dizziness resolved stopping gabapentin. Pt reports dizziness is rare, once every 6-8 weeks, feels like she is about to "pass out" and then it goes away. Pt denies LOC. Symptoms come on whilst sitting. No known aggravating factors. "It never occurs because of motion." Denies any true vertigo. She saw cardiology and had a Holter monitored which per cardiology, study offered no remarkable findings. Report mentioned infrequent PVC, several runs of SVT, longest of which was 29 beats. No pauses. No symptoms reported. She had a brain MRI 09/02/19 with "no abnormality seen to explain the clinical presentation." Denies history of HA, CP. Reports intermittent numbness in the R hand. Denies any focal weakness.    Limitations Walking    Diagnostic tests See history    Patient Stated Goals Improve balance    Currently in Pain? Yes    Pain Score 8     Pain Location Leg    Pain Orientation Right    Pain Descriptors / Indicators Aching    Pain Onset More than a month ago    Aggravating Factors  walking    Pain Relieving Factors medicine    Effect of Pain on Daily Activities slow to do ADL's  Ther-ex  Octane fitness x 5 mins  Hip flexion marches with 2# ankle weights x 10 bilateral Hip abduction with 2# x 10 bilateral Hip extension with 2# x 10 bilateral Lunges to BOSU ball x 15 BLE Heel raises x 15 x 2 sets High marching x 20 BLE Step ups to 6-inch stool x 20  Eccentric step downs from 3 inch stool x 10 verbal cues to complete slow and tap heel.  BUE CGA Leg press 40 lbs x 20 x 3   Patient needs occasional verbal cueing to improve posture and cueing to correctly perform exercises slowly, holding at end of range to increase motor firing of desired muscle to encourage fatigue.                            PT Education - 01/11/20 1115    Education Details HEP    Person(s) Educated Patient    Methods Explanation    Comprehension Verbalized understanding            PT Short Term Goals - 11/22/19 1627      PT SHORT TERM GOAL #1   Title Pt will be independent with HEP in order to improve strength and balance in order to decrease fall risk and improve function at home.    Time 4    Period Weeks    Status New    Target Date 12/20/19             PT Long Term Goals - 11/22/19 1628      PT LONG TERM GOAL #1   Title Pt will improve BERG by at least 3 points in order to demonstrate clinically significant improvement in balance.    Baseline 11/22/19: 47/56    Time 8    Period Weeks    Status New    Target Date 01/17/20      PT LONG TERM GOAL #2   Title Pt will improve ABC by at least 13% in order to demonstrate clinically significant improvement in balance confidence.    Baseline 11/22/19: 35.6%    Time 8    Period Weeks    Status New    Target Date 01/17/20      PT LONG TERM GOAL #3   Title Pt will decrease TUG to below 14 seconds/decrease in order to demonstrate decreased fall risk.    Baseline 11/22/19: 16.1s    Time 8    Period Weeks    Status New    Target Date 01/17/20                 Plan - 01/11/20 1128    Clinical Impression Statement Pt requires direction and verbal cues for correct performance of balance and strengthening exercises. Patient demonstrates weakness in BLE and performs open and closed chain exercises with no reports of increased pain. Patient is having arthritis pain that is limiting her ability to be active. Pt was able to perform all exercises with min assist and VC for technique Pt encouraged continuing HEP .Follow-up as scheduled.   Personal Factors and Comorbidities Age;Comorbidity 3+;Past/Current Experience    Comorbidities HTN, OA, OSA, hypothyroidism    Examination-Activity Limitations  Squat;Stand;Stairs;Transfers    Examination-Participation Restrictions Church;Community Activity;Cleaning    Stability/Clinical Decision Making Evolving/Moderate complexity    Rehab Potential Good    PT Frequency 2x / week    PT Duration 8 weeks    PT Treatment/Interventions ADLs/Self Care Home  Management;Aquatic Therapy;Canalith Repostioning;Cryotherapy;Electrical Stimulation;Iontophoresis 4mg /ml Dexamethasone;Moist Heat;Traction;Ultrasound;DME Instruction;Gait training;Functional mobility training;Stair training;Therapeutic activities;Therapeutic exercise;Balance training;Neuromuscular re-education;Patient/family education;Manual techniques;Passive range of motion;Dry needling;Vestibular;Spinal Manipulations;Joint Manipulations    PT Next Visit Plan Review HEP, strength and balance exercises    PT Home Exercise Plan Medbridge Access Code: [email or web address] (seated clams with tband, seated marching with tband, standing mini squats)    Consulted and Agree with Plan of Care Patient           Patient will benefit from skilled therapeutic intervention in order to improve the following deficits and impairments:  Abnormal gait, Decreased balance, Decreased strength, Pain  Visit Diagnosis: Unsteadiness on feet  Muscle weakness (generalized)  Localized edema     Problem List Patient Active Problem List   Diagnosis Date Noted  . 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 Chronic left-sided low back pain with left-sided sciatica 06/22/2018  . Vitamin B 12 deficiency 01/02/2018  . Chronic anemia 09/06/2017  . Chronic constipation 06/08/2017  . Lumbar radiculopathy 05/22/2017  . Spinal stenosis of lumbosacral region 03/19/2017  . 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 Acute bilateral low back pain without sciatica 02/04/2017  . Dysuria 12/13/2016  . Prediabetes 11/05/2016  . Acquired hypothyroidism 08/24/2016  . Myofascial pain syndrome  08/24/2016  . Essential hypertension 08/24/2016  . Osteopenia of multiple sites 08/24/2016  . Lumbar degenerative disc disease 08/24/2016  . Bilateral hydronephrosis 07/15/2016  . Rotator cuff tendinitis, left 06/21/2016  . 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 Rotator cuff tendinitis, right 11/27/2015  . 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  . Injury of tendon of long head of right biceps 11/27/2015  . Incomplete tear of right rotator cuff 11/27/2015  . Obstructive sleep apnea on CPAP 07/13/2015  . Osteoarthritis 11/04/2013  . Disorder of uterus 04/29/1995    Alanson Puls, [place] DPT 01/11/2020, 11:38 AM  [place] MAIN [facility] SERVICES [address] [place], [place], [address] Phone: [phone]   Fax:  [phone]  Name: Erika Crawford MRN: [id number] Date of Birth: Aug 28, 1940

## 2020-01-13 ENCOUNTER — Ambulatory Visit: Payer: Medicare Other

## 2020-01-18 ENCOUNTER — Ambulatory Visit: Payer: Medicare Other | Attending: Surgery | Admitting: Physical Therapy

## 2020-01-18 ENCOUNTER — Encounter: Payer: Self-pay | Admitting: Physical Therapy

## 2020-01-18 ENCOUNTER — Other Ambulatory Visit: Payer: Self-pay

## 2020-01-18 DIAGNOSIS — M6281 Muscle weakness (generalized): Secondary | ICD-10-CM | POA: Diagnosis present

## 2020-01-18 DIAGNOSIS — R600 Localized edema: Secondary | ICD-10-CM | POA: Insufficient documentation

## 2020-01-18 DIAGNOSIS — R2681 Unsteadiness on feet: Secondary | ICD-10-CM | POA: Diagnosis present

## 2020-01-18 NOTE — Therapy (Addendum)
[place] MAIN [facility] SERVICES [address] [place], [place], [address] Phone: [phone]   Fax:  [phone]  Physical Therapy Treatment Physical Therapy Progress Note   Dates of reporting period 11/22/19  to 01/18/20  Patient Details  Name: Erika Crawford MRN: [id number] Date of Birth: 04-07-1941 Referring Provider (PT): Dr. Melrose Nakayama   Encounter Date: 01/18/2020   PT End of Session - 01/18/20 1434    Visit Number 10    Number of Visits 17    Date for PT Re-Evaluation 01/17/20    Authorization Type eval: 11/22/19, FOTO completed    PT Start Time 0230    PT Stop Time 0315    PT Time Calculation (min) 45 min    Equipment Utilized During Treatment Gait belt    Activity Tolerance Patient tolerated treatment well;No increased pain    Behavior During Therapy WFL for tasks assessed/performed           Past Medical History:  Diagnosis Date  . Arthritis   . Complication of anesthesia    dizziness  . Hypertension   . Hypothyroidism   . Sleep apnea    cpap  . Thyroid disease     Past Surgical History:  Procedure Laterality Date  . ABDOMINAL HYSTERECTOMY    . BACK SURGERY     lower middle kyphoplasty  . COLONOSCOPY WITH PROPOFOL N/A 09/21/2015   Procedure: COLONOSCOPY WITH PROPOFOL;  Surgeon: Hulen Luster, MD;  Location: [facility] ENDOSCOPY;  Service: Gastroenterology;  Laterality: N/A;  . JOINT REPLACEMENT     03/19/2016- left knee replacement  . REVERSE SHOULDER ARTHROPLASTY Right 08/25/2018   Procedure: REVERSE SHOULDER ARTHROPLASTY, RIGHT;  Surgeon: Corky Mull, MD;  Location: [facility] ORS;  Service: Orthopedics;  Laterality: Right;  . 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   Procedure: TOTAL KNEE ARTHROPLASTY;  Surgeon: Corky Mull, MD;  Location: [facility] ORS;  Service: Orthopedics;  Laterality: Left;    There were no vitals filed for this visit.   Subjective Assessment - 01/18/20 1432    Subjective Patient stated she is doing not so great.  she is having high pain.    Pertinent History Pt reports sustaining a fall beginning of April 2021, landed on bilateral knees and hands. Has since had bilateral thigh/hip pain. Prior to event, had difficulty with balance but subsequently worse. History of dizziness resolved stopping gabapentin. Pt reports dizziness is rare, once every 6-8 weeks, feels like she is about to "pass out" and then it goes away. Pt denies LOC. Symptoms come on whilst sitting. No known aggravating factors. "It never occurs because of motion." Denies any true vertigo. She saw cardiology and had a Holter monitored which per cardiology, study offered no remarkable findings. Report mentioned infrequent PVC, several runs of SVT, longest of which was 29 beats. No pauses. No symptoms reported. She had a brain MRI 09/02/19 with "no abnormality seen to explain the clinical presentation." Denies history of HA, CP. Reports intermittent numbness in the R hand. Denies any focal weakness.    Limitations Walking    Diagnostic tests See history    Patient Stated Goals Improve balance    Currently in Pain? Yes    Pain Score 9     Pain Location Leg    Pain Orientation Right    Pain Descriptors / Indicators Aching    Pain Type Chronic pain    Pain Onset More than a month ago    Aggravating Factors  walking  Pain Relieving Factors unknown    Effect of Pain on Daily Activities difficult              [facility] PT Assessment - 01/18/20 0001      Berg Balance Test   Sit to Stand Able to stand without using hands and stabilize independently    Standing Unsupported Able to stand safely 2 minutes    Sitting with Back Unsupported but Feet Supported on Floor or Stool Able to sit safely and securely 2 minutes    Stand to Sit Sits safely with minimal use of hands    Transfers Able to transfer safely, minor use of hands    Standing Unsupported with Eyes Closed Able to stand 10 seconds safely    Standing Unsupported with Feet Together Able to  place feet together independently and stand 1 minute safely    From Standing, Reach Forward with Outstretched Arm Can reach confidently >25 cm (10")    From Standing Position, Pick up Object from Floor Able to pick up shoe, needs supervision    From Standing Position, Turn to Look Behind Over each Shoulder Looks behind from both sides and weight shifts well    Turn 360 Degrees Able to turn 360 degrees safely in 4 seconds or less    Standing Unsupported, Alternately Place Feet on Step/Stool Able to stand independently and safely and complete 8 steps in 20 seconds    Standing Unsupported, One Foot in Front Able to plae foot ahead of the other independently and hold 30 seconds    Standing on One Leg Able to lift leg independently and hold equal to or more than 3 seconds    Total Score 52          Treatment Outcome measures performed including TUG, Berg, ABC and foto .  Patient performed with instruction, verbal cues, tactile cues of therapist: goal: increase tissue extensibility, promote proper posture, improve mobility:                        PT Education - 01/18/20 1434    Education Details HEP    Person(s) Educated Patient    Methods Explanation    Comprehension Verbalized understanding;Need further instruction            PT Short Term Goals - 01/18/20 1435      PT SHORT TERM GOAL #1   Title Pt will be independent with HEP in order to improve strength and balance in order to decrease fall risk and improve function at home.    Time 4    Period Weeks    Status Partially Met    Target Date 12/20/19             PT Long Term Goals - 01/18/20 1435      PT LONG TERM GOAL #1   Title Pt will improve BERG by at least 3 points in order to demonstrate clinically significant improvement in balance.    Baseline 11/22/19: 47/56, 01/18/20= 52/56    Time 8    Period Weeks    Status Partially Met    Target Date 03/13/20      PT LONG TERM GOAL #2   Title Pt will  improve ABC by at least 13% in order to demonstrate clinically significant improvement in balance confidence.    Baseline 11/22/19: 35.6%, 01/18/20=    Time 8    Period Weeks    Status Partially Met  Target Date 03/13/20      PT LONG TERM GOAL #3   Title Pt will decrease TUG to below 14 seconds/decrease in order to demonstrate decreased fall risk.    Baseline 11/22/19: 16.1s, 01/18/20=14 sec,10.26 sec    Time 8    Period Weeks    Status Partially Met    Target Date 03/13/20      PT LONG TERM GOAL #4   Title Patient will improve with FOTO score to demonstrate improved functional mobility    Time 8    Period Weeks    Status New    Target Date 03/13/20                 Plan - 01/18/20 1434    Clinical Impression Statement Patient's condition has the potential to improve in response to therapy. Maximum improvement is yet to be obtained. The anticipated improvement is attainable and reasonable in a generally predictable time.  Patient reports that her functional mobility depends on how much pain she has during the day. Her outcome measures improved and goals were reviewed and progressed. Patient will continue to benefit from skilled PT to improve mobility.    Personal Factors and Comorbidities Age;Comorbidity 3+;Past/Current Experience    Comorbidities HTN, OA, OSA, hypothyroidism    Examination-Activity Limitations Squat;Stand;Stairs;Transfers    Examination-Participation Restrictions Church;Community Activity;Cleaning    Stability/Clinical Decision Making Evolving/Moderate complexity    Rehab Potential Good    PT Frequency 2x / week    PT Duration 8 weeks    PT Treatment/Interventions ADLs/Self Care Home Management;Aquatic Therapy;Canalith Repostioning;Cryotherapy;Electrical Stimulation;Iontophoresis 38m/ml Dexamethasone;Moist Heat;Traction;Ultrasound;DME Instruction;Gait training;Functional mobility training;Stair training;Therapeutic activities;Therapeutic exercise;Balance  training;Neuromuscular re-education;Patient/family education;Manual techniques;Passive range of motion;Dry needling;Vestibular;Spinal Manipulations;Joint Manipulations    PT Next Visit Plan Review HEP, strength and balance exercises    PT Home Exercise Plan Medbridge Access Code: [email or web address](seated clams with tband, seated marching with tband, standing mini squats)    Consulted and Agree with Plan of Care Patient           Patient will benefit from skilled therapeutic intervention in order to improve the following deficits and impairments:  Abnormal gait, Decreased balance, Decreased strength, Pain  Visit Diagnosis: Unsteadiness on feet - Plan: PT plan of care cert/re-cert  Muscle weakness (generalized) - Plan: PT plan of care cert/re-cert  Localized edema - Plan: PT plan of care cert/re-cert     Problem List Patient Active Problem List   Diagnosis Date Noted  . 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 Chronic left-sided low back pain with left-sided sciatica 06/22/2018  . Vitamin B 12 deficiency 01/02/2018  . Chronic anemia 09/06/2017  . Chronic constipation 06/08/2017  . Lumbar radiculopathy 05/22/2017  . Spinal stenosis of lumbosacral region 03/19/2017  . 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 Acute bilateral low back pain without sciatica 02/04/2017  . Dysuria 12/13/2016  . Prediabetes 11/05/2016  . Acquired hypothyroidism 08/24/2016  . Myofascial pain syndrome 08/24/2016  . Essential hypertension 08/24/2016  . Osteopenia of multiple sites 08/24/2016  . Lumbar degenerative disc disease 08/24/2016  . Bilateral hydronephrosis 07/15/2016  . Rotator cuff tendinitis, left 06/21/2016  . 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 Rotator cuff tendinitis, right 11/27/2015  . 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  . Injury of tendon of long head of right biceps 11/27/2015  . Incomplete  tear of right rotator cuff 11/27/2015  . Obstructive sleep apnea on CPAP  07/13/2015  . Osteoarthritis 11/04/2013  . Disorder of uterus 04/29/1995    Alanson Puls, [place] DPT 01/18/2020, 3:14 PM  [place] MAIN [facility] SERVICES [address] [place], [place], [address] Phone: [phone]   Fax:  [phone]  Name: Erika Crawford MRN: [id number] Date of Birth: 1940-09-27

## 2020-01-18 NOTE — Addendum Note (Signed)
Addended by: Alanson Puls on: 01/18/2020 03:14 PM   Modules accepted: Orders

## 2020-01-24 ENCOUNTER — Ambulatory Visit: Payer: Medicare Other | Admitting: Physical Therapy

## 2020-01-25 ENCOUNTER — Ambulatory Visit: Payer: Medicare Other | Admitting: Physical Therapy

## 2020-01-27 ENCOUNTER — Other Ambulatory Visit: Payer: Self-pay

## 2020-01-27 ENCOUNTER — Ambulatory Visit: Payer: Medicare Other | Admitting: Physical Therapy

## 2020-01-27 DIAGNOSIS — R2681 Unsteadiness on feet: Secondary | ICD-10-CM

## 2020-01-27 DIAGNOSIS — M6281 Muscle weakness (generalized): Secondary | ICD-10-CM

## 2020-01-27 NOTE — Therapy (Signed)
[place] MAIN [facility] SERVICES [address] [place], [place], [address] Phone: [phone]   Fax:  [phone]  Physical Therapy Treatment  Patient Details  Name: Erika Crawford MRN: [id number] Date of Birth: 1940-09-15 Referring Provider (PT): Dr. Melrose Nakayama   Encounter Date: 01/27/2020   PT End of Session - 01/27/20 1111    Visit Number 11    Number of Visits 25    Date for PT Re-Evaluation 03/13/20    Authorization Type eval: 11/22/19, FOTO completed    PT Start Time 1102    PT Stop Time 1145    PT Time Calculation (min) 43 min    Equipment Utilized During Treatment Gait belt    Activity Tolerance Patient tolerated treatment well;No increased pain    Behavior During Therapy WFL for tasks assessed/performed           Past Medical History:  Diagnosis Date   Arthritis    Complication of anesthesia    dizziness   Hypertension    Hypothyroidism    Sleep apnea    cpap   Thyroid disease     Past Surgical History:  Procedure Laterality Date   ABDOMINAL HYSTERECTOMY     BACK SURGERY     lower middle kyphoplasty   COLONOSCOPY WITH PROPOFOL N/A 09/21/2015   Procedure: COLONOSCOPY WITH PROPOFOL;  Surgeon: Hulen Luster, MD;  Location: [facility] ENDOSCOPY;  Service: Gastroenterology;  Laterality: N/A;   JOINT REPLACEMENT     03/19/2016- left knee replacement   REVERSE SHOULDER ARTHROPLASTY Right 08/25/2018   Procedure: REVERSE SHOULDER ARTHROPLASTY, RIGHT;  Surgeon: Corky Mull, MD;  Location: [facility] ORS;  Service: Orthopedics;  Laterality: Right;   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   Procedure: TOTAL KNEE ARTHROPLASTY;  Surgeon: Corky Mull, MD;  Location: [facility] ORS;  Service: Orthopedics;  Laterality: Left;    There were no vitals filed for this visit.   Subjective Assessment - 01/27/20 1110    Subjective Patient reports increased pain in shoulders/legs; She states, "I am hurting all over." She denies any new falls;     Pertinent History Pt reports sustaining a fall beginning of April 2021, landed on bilateral knees and hands. Has since had bilateral thigh/hip pain. Prior to event, had difficulty with balance but subsequently worse. History of dizziness resolved stopping gabapentin. Pt reports dizziness is rare, once every 6-8 weeks, feels like she is about to "pass out" and then it goes away. Pt denies LOC. Symptoms come on whilst sitting. No known aggravating factors. "It never occurs because of motion." Denies any true vertigo. She saw cardiology and had a Holter monitored which per cardiology, study offered no remarkable findings. Report mentioned infrequent PVC, several runs of SVT, longest of which was 29 beats. No pauses. No symptoms reported. She had a brain MRI 09/02/19 with no abnormality seen to explain the clinical presentation. Denies history of HA, CP. Reports intermittent numbness in the R hand. Denies any focal weakness.    Limitations Walking    Diagnostic tests See history    Patient Stated Goals Improve balance    Currently in Pain? Yes    Pain Score 8     Pain Location Other (Comment)   all over   Pain Descriptors / Indicators Aching    Pain Type Chronic pain    Pain Onset More than a month ago    Pain Frequency Constant    Aggravating Factors  walking    Pain Relieving  Factors heat/rest    Effect of Pain on Daily Activities decreased activity tolerance;                       Ther-ex Patient hooklying with moist heat to low back and cervical paraspinals With 2# ankle weight: -SAQ x20 reps each LE;  -gluteal squeeze 2 sec hold x20 reps;  -marches x20 reps each LE;  -heel slides x20 reps each LE -SLR hip flexion x10 reps each LE  Patient required min verbal/tactile cues for correct exercise technique including cues to slow down LE movement and increase ROM for better tolerance;   Sidelying:  Red tband clamshells x20 reps each LE;  Patient required min A for proper  positioning and min VCs for correct technique for hip strengthening;    Standing in parallel bars with 2# ankle weights: -side stepping x10 feet x3 laps each direction;  -heel raises x15 reps  -hip flexion march x15 reps;     Patient needs occasional verbal cueing to improve posture and cueing to correctly perform exercises slowly, holding at end of range to increase motor firing of desired muscle to encourage fatigue.  She reports less pain in body following exercise. She states, "The heat and lying down really did help today. "                  PT Education - 01/27/20 1111    Education Details LE strengthening, HEP    Person(s) Educated Patient    Methods Explanation;Verbal cues    Comprehension Returned demonstration;Verbal cues required;Verbalized understanding;Need further instruction            PT Short Term Goals - 01/18/20 1435      PT SHORT TERM GOAL #1   Title Pt will be independent with HEP in order to improve strength and balance in order to decrease fall risk and improve function at home.    Time 4    Period Weeks    Status Partially Met    Target Date 12/20/19             PT Long Term Goals - 01/18/20 1435      PT LONG TERM GOAL #1   Title Pt will improve BERG by at least 3 points in order to demonstrate clinically significant improvement in balance.    Baseline 11/22/19: 47/56, 01/18/20= 52/56    Time 8    Period Weeks    Status Partially Met    Target Date 03/13/20      PT LONG TERM GOAL #2   Title Pt will improve ABC by at least 13% in order to demonstrate clinically significant improvement in balance confidence.    Baseline 11/22/19: 35.6%, 01/18/20=    Time 8    Period Weeks    Status Partially Met    Target Date 03/13/20      PT LONG TERM GOAL #3   Title Pt will decrease TUG to below 14 seconds/decrease in order to demonstrate decreased fall risk.    Baseline 11/22/19: 16.1s, 01/18/20=14 sec,10.26 sec    Time 8    Period Weeks    Status  Partially Met    Target Date 03/13/20      PT LONG TERM GOAL #4   Title Patient will improve with FOTO score to demonstrate improved functional mobility    Time 8    Period Weeks    Status New    Target Date 03/13/20  Plan - 01/27/20 1125    Clinical Impression Statement Patient motivated and participated well within session. She reports increased pain throughout her body today. Therefore she was instructed in advanced LE strengthening exercise in hooklying/sidelying for better tolerance. PT applied moist heat for better comfort. Patient does require min VCs for proper exercise technique/positioning for optimal muscle activation. She would benefit from additional skilled PT intervention to improve strength, balance and mobility;    Personal Factors and Comorbidities Age;Comorbidity 3+;Past/Current Experience    Comorbidities HTN, OA, OSA, hypothyroidism    Examination-Activity Limitations Squat;Stand;Stairs;Transfers    Examination-Participation Restrictions Church;Community Activity;Cleaning    Stability/Clinical Decision Making Evolving/Moderate complexity    Rehab Potential Good    PT Frequency 2x / week    PT Duration 8 weeks    PT Treatment/Interventions ADLs/Self Care Home Management;Aquatic Therapy;Canalith Repostioning;Cryotherapy;Electrical Stimulation;Iontophoresis 2m/ml Dexamethasone;Moist Heat;Traction;Ultrasound;DME Instruction;Gait training;Functional mobility training;Stair training;Therapeutic activities;Therapeutic exercise;Balance training;Neuromuscular re-education;Patient/family education;Manual techniques;Passive range of motion;Dry needling;Vestibular;Spinal Manipulations;Joint Manipulations    PT Next Visit Plan Review HEP, strength and balance exercises    PT Home Exercise Plan Medbridge Access Code: [email or web address](seated clams with tband, seated marching with tband, standing mini squats)    Consulted and Agree with Plan of Care Patient            Patient will benefit from skilled therapeutic intervention in order to improve the following deficits and impairments:  Abnormal gait, Decreased balance, Decreased strength, Pain  Visit Diagnosis: Unsteadiness on feet  Muscle weakness (generalized)     Problem List Patient Active Problem List   Diagnosis Date Noted   Status post reverse total shoulder replacement, right 08/25/2018   Chronic left-sided low back pain with left-sided sciatica 06/22/2018   Vitamin B 12 deficiency 01/02/2018   Chronic anemia 09/06/2017   Chronic constipation 06/08/2017   Lumbar radiculopathy 05/22/2017   Spinal stenosis of lumbosacral region 03/19/2017   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 03/10/2017   Acute bilateral low back pain without sciatica 02/04/2017   Dysuria 12/13/2016   Prediabetes 11/05/2016   Acquired hypothyroidism 08/24/2016   Myofascial pain syndrome 08/24/2016   Essential hypertension 08/24/2016   Osteopenia of multiple sites 08/24/2016   Lumbar degenerative disc disease 08/24/2016   Bilateral hydronephrosis 07/15/2016   Rotator cuff tendinitis, left 06/21/2016   Status post total knee replacement using cement, 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   Rotator cuff tendinitis, right 11/27/2015   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   Injury of tendon of long head of right biceps 11/27/2015   Incomplete tear of right rotator cuff 11/27/2015   Obstructive sleep apnea on CPAP 07/13/2015   Osteoarthritis 11/04/2013   Disorder of uterus 04/29/1995    Arlayne Liggins,Jahni PT, DPT 01/27/2020, 11:45 AM  [place] [address]Phone: [phone]  Fax:  [phone] Name: Erika MIFSUDMRN: [id number] of Birth: [birth year]

## 2020-01-31 ENCOUNTER — Ambulatory Visit: Payer: Medicare Other | Admitting: Physical Therapy

## 2020-02-02 ENCOUNTER — Ambulatory Visit: Payer: Medicare Other | Admitting: Physical Therapy

## 2020-02-02 ENCOUNTER — Encounter: Payer: Self-pay | Admitting: Physical Therapy

## 2020-02-02 ENCOUNTER — Other Ambulatory Visit: Payer: Self-pay

## 2020-02-02 DIAGNOSIS — R2681 Unsteadiness on feet: Secondary | ICD-10-CM

## 2020-02-02 DIAGNOSIS — M6281 Muscle weakness (generalized): Secondary | ICD-10-CM

## 2020-02-02 NOTE — Therapy (Signed)
[place] MAIN [facility] SERVICES [address] [place], [place], [address] Phone: [phone]   Fax:  [phone]  Physical Therapy Treatment  Patient Details  Name: Erika Crawford MRN: [id number] Date of Birth: 02-Oct-1940 Referring Provider (PT): Dr. Melrose Nakayama   Encounter Date: 02/02/2020   PT End of Session - 02/02/20 1405    Visit Number 12    Number of Visits 25    Date for PT Re-Evaluation 03/13/20    Authorization Type eval: 11/22/19, FOTO completed    PT Start Time 1350    PT Stop Time 1435    PT Time Calculation (min) 45 min    Equipment Utilized During Treatment Gait belt    Activity Tolerance Patient tolerated treatment well;No increased pain    Behavior During Therapy WFL for tasks assessed/performed           Past Medical History:  Diagnosis Date  . Arthritis   . Complication of anesthesia    dizziness  . Hypertension   . Hypothyroidism   . Sleep apnea    cpap  . Thyroid disease     Past Surgical History:  Procedure Laterality Date  . ABDOMINAL HYSTERECTOMY    . BACK SURGERY     lower middle kyphoplasty  . COLONOSCOPY WITH PROPOFOL N/A 09/21/2015   Procedure: COLONOSCOPY WITH PROPOFOL;  Surgeon: Hulen Luster, MD;  Location: [facility] ENDOSCOPY;  Service: Gastroenterology;  Laterality: N/A;  . JOINT REPLACEMENT     03/19/2016- left knee replacement  . REVERSE SHOULDER ARTHROPLASTY Right 08/25/2018   Procedure: REVERSE SHOULDER ARTHROPLASTY, RIGHT;  Surgeon: Corky Mull, MD;  Location: [facility] ORS;  Service: Orthopedics;  Laterality: Right;  . 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   Procedure: TOTAL KNEE ARTHROPLASTY;  Surgeon: Corky Mull, MD;  Location: [facility] ORS;  Service: Orthopedics;  Laterality: Left;    There were no vitals filed for this visit.   Subjective Assessment - 02/02/20 1403    Subjective Patient reports feeling a little better today. She reports having pain in left thigh but states she tried theraworks and  that has helped.    Pertinent History Pt reports sustaining a fall beginning of April 2021, landed on bilateral knees and hands. Has since had bilateral thigh/hip pain. Prior to event, had difficulty with balance but subsequently worse. History of dizziness resolved stopping gabapentin. Pt reports dizziness is rare, once every 6-8 weeks, feels like she is about to "pass out" and then it goes away. Pt denies LOC. Symptoms come on whilst sitting. No known aggravating factors. "It never occurs because of motion." Denies any true vertigo. She saw cardiology and had a Holter monitored which per cardiology, study offered no remarkable findings. Report mentioned infrequent PVC, several runs of SVT, longest of which was 29 beats. No pauses. No symptoms reported. She had a brain MRI 09/02/19 with "no abnormality seen to explain the clinical presentation." Denies history of HA, CP. Reports intermittent numbness in the R hand. Denies any focal weakness.    Limitations Walking    Diagnostic tests See history    Patient Stated Goals Improve balance    Currently in Pain? Yes    Pain Score 6     Pain Location Leg    Pain Orientation Left    Pain Descriptors / Indicators Aching;Sore    Pain Type Chronic pain    Pain Onset More than a month ago    Pain Frequency Intermittent    Aggravating Factors  walking    Pain Relieving Factors heat/rest    Effect of Pain on Daily Activities decreased activity tolerance;    Multiple Pain Sites No            TREATMENT:   Ther-ex Patient hooklying with moist heat to low back and cervical paraspinals With 2# ankle weight: -SAQ x20 reps each LE;  -gluteal squeeze 2 sec hold x20 reps;  -marches x20 reps RLE only, LLE deferred due to new exacerbation of pain;  -heel slides x20 reps each LE -SLR hip flexion x10 reps each LE  Patient required min verbal/tactile cues for correct exercise technique including cues to slow down LE movement and increase ROM for better  tolerance;   Sidelying:  Red tband clamshells x20 reps each LE;  Patient required min A for proper positioning and min VCs for correct technique for hip strengthening;   While in right sidelying, PT performed gentle LLE quad stretch passive 30 sec hold x1 rep;   Seated with rolling stick to LLE anterior thigh x1 min;   Standing in parallel bars with red tband around BLE above knees -side stepping x10 feet x3 laps each direction;  -hip extension x15 reps each LE Required min VCs for erect posture and to increase AROM for better strengthening;   Stepping over orange hurdles #2   Forward reciprocal x10 reps  Side stepping x5 reps each direction Patient required intermittent HHA for balance with stepping over hurdles with min VCs to increase step length for better foot clearance; She was also instructed to increase heel strike for better safety;   Patient needs occasional verbal cueing to improve posture and cueing to correctly perform exercises slowly, holding at end of range to increase motor firing of desired muscle to encourage fatigue. She reports less pain in body following exercise. She states, "I feel a lot better than when I got here "                    PT Education - 02/02/20 1404    Education Details LE strengthening, HEP    Person(s) Educated Patient    Methods Explanation;Verbal cues    Comprehension Verbalized understanding;Returned demonstration;Verbal cues required;Need further instruction            PT Short Term Goals - 01/18/20 1435      PT SHORT TERM GOAL #1   Title Pt will be independent with HEP in order to improve strength and balance in order to decrease fall risk and improve function at home.    Time 4    Period Weeks    Status Partially Met    Target Date 12/20/19             PT Long Term Goals - 01/18/20 1435      PT LONG TERM GOAL #1   Title Pt will improve BERG by at least 3 points in order to demonstrate clinically  significant improvement in balance.    Baseline 11/22/19: 47/56, 01/18/20= 52/56    Time 8    Period Weeks    Status Partially Met    Target Date 03/13/20      PT LONG TERM GOAL #2   Title Pt will improve ABC by at least 13% in order to demonstrate clinically significant improvement in balance confidence.    Baseline 11/22/19: 35.6%, 01/18/20=    Time 8    Period Weeks    Status Partially Met    Target Date 03/13/20  PT LONG TERM GOAL #3   Title Pt will decrease TUG to below 14 seconds/decrease in order to demonstrate decreased fall risk.    Baseline 11/22/19: 16.1s, 01/18/20=14 sec,10.26 sec    Time 8    Period Weeks    Status Partially Met    Target Date 03/13/20      PT LONG TERM GOAL #4   Title Patient will improve with FOTO score to demonstrate improved functional mobility    Time 8    Period Weeks    Status New    Target Date 03/13/20                 Plan - 02/02/20 1501    Clinical Impression Statement Patient motivated and participated well within session. She was instructed in advanced LE strengthening exercise. She tolerated LLE stretches well and reports less pain following heat and soft tissue massage. patient was able to progress with strengthening exercise in parallel bars this session with increased repetition/resistance. She does require min VCs for correct exercise technique for optimal muscle strengthening. She tolerated session well reporting less pain at end of session. She would benefit from additional skilled PT Intervention to improve strength, balance and mobility;    Personal Factors and Comorbidities Age;Comorbidity 3+;Past/Current Experience    Comorbidities HTN, OA, OSA, hypothyroidism    Examination-Activity Limitations Squat;Stand;Stairs;Transfers    Examination-Participation Restrictions Church;Community Activity;Cleaning    Stability/Clinical Decision Making Evolving/Moderate complexity    Rehab Potential Good    PT Frequency 2x / week    PT  Duration 8 weeks    PT Treatment/Interventions ADLs/Self Care Home Management;Aquatic Therapy;Canalith Repostioning;Cryotherapy;Electrical Stimulation;Iontophoresis 39m/ml Dexamethasone;Moist Heat;Traction;Ultrasound;DME Instruction;Gait training;Functional mobility training;Stair training;Therapeutic activities;Therapeutic exercise;Balance training;Neuromuscular re-education;Patient/family education;Manual techniques;Passive range of motion;Dry needling;Vestibular;Spinal Manipulations;Joint Manipulations    PT Next Visit Plan Review HEP, strength and balance exercises    PT Home Exercise Plan Medbridge Access Code: [email or web address](seated clams with tband, seated marching with tband, standing mini squats)    Consulted and Agree with Plan of Care Patient           Patient will benefit from skilled therapeutic intervention in order to improve the following deficits and impairments:  Abnormal gait, Decreased balance, Decreased strength, Pain  Visit Diagnosis: Unsteadiness on feet  Muscle weakness (generalized)     Problem List Patient Active Problem List   Diagnosis Date Noted  . 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 Chronic left-sided low back pain with left-sided sciatica 06/22/2018  . Vitamin B 12 deficiency 01/02/2018  . Chronic anemia 09/06/2017  . Chronic constipation 06/08/2017  . Lumbar radiculopathy 05/22/2017  . Spinal stenosis of lumbosacral region 03/19/2017  . 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 Acute bilateral low back pain without sciatica 02/04/2017  . Dysuria 12/13/2016  . Prediabetes 11/05/2016  . Acquired hypothyroidism 08/24/2016  . Myofascial pain syndrome 08/24/2016  . Essential hypertension 08/24/2016  . Osteopenia of multiple sites 08/24/2016  . Lumbar degenerative disc disease 08/24/2016  . Bilateral hydronephrosis 07/15/2016  . Rotator cuff tendinitis, left 06/21/2016  . Status post total knee  replacement using cement, 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  . Rotator cuff tendinitis, right 11/27/2015  . 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  . Injury of tendon of long head of right biceps 11/27/2015  . Incomplete tear of right rotator cuff 11/27/2015  . Obstructive sleep apnea on CPAP 07/13/2015  . Osteoarthritis 11/04/2013  . Disorder of uterus 04/29/1995    Cono Gebhard,Saraann PT,  DPT 02/02/2020, 3:03 PM  [place] MAIN [facility] SERVICES [address] [place], [place], [address] Phone: [phone]   Fax:  [phone]  Name: Erika Crawford MRN: [id number] Date of Birth: [birth year]

## 2020-02-07 ENCOUNTER — Other Ambulatory Visit: Payer: Self-pay

## 2020-02-07 ENCOUNTER — Ambulatory Visit: Payer: Medicare Other | Admitting: Physical Therapy

## 2020-02-07 ENCOUNTER — Encounter: Payer: Self-pay | Admitting: Physical Therapy

## 2020-02-07 DIAGNOSIS — R2681 Unsteadiness on feet: Secondary | ICD-10-CM

## 2020-02-07 DIAGNOSIS — R600 Localized edema: Secondary | ICD-10-CM

## 2020-02-07 DIAGNOSIS — M6281 Muscle weakness (generalized): Secondary | ICD-10-CM

## 2020-02-07 NOTE — Therapy (Signed)
[place] MAIN [facility] SERVICES [address] [place], [place], [address] Phone: [phone]   Fax:  [phone]  Physical Therapy Treatment  Patient Details  Name: Erika Crawford MRN: [id number] Date of Birth: [birth year] Referring Provider (PT): Dr. Melrose Nakayama   Encounter Date: 02/07/2020   PT End of Session - 02/07/20 1316    Visit Number 13    Number of Visits 25    Date for PT Re-Evaluation 03/13/20    Authorization Type eval: 11/22/19, FOTO completed    PT Start Time 1300    PT Stop Time 1345    PT Time Calculation (min) 45 min    Equipment Utilized During Treatment Gait belt    Activity Tolerance Patient tolerated treatment well;No increased pain    Behavior During Therapy WFL for tasks assessed/performed           Past Medical History:  Diagnosis Date  . Arthritis   . Complication of anesthesia    dizziness  . Hypertension   . Hypothyroidism   . Sleep apnea    cpap  . Thyroid disease     Past Surgical History:  Procedure Laterality Date  . ABDOMINAL HYSTERECTOMY    . BACK SURGERY     lower middle kyphoplasty  . COLONOSCOPY WITH PROPOFOL N/A 09/21/2015   Procedure: COLONOSCOPY WITH PROPOFOL;  Surgeon: Hulen Luster, MD;  Location: [facility] ENDOSCOPY;  Service: Gastroenterology;  Laterality: N/A;  . JOINT REPLACEMENT     03/19/2016- left knee replacement  . REVERSE SHOULDER ARTHROPLASTY Right 08/25/2018   Procedure: REVERSE SHOULDER ARTHROPLASTY, RIGHT;  Surgeon: Corky Mull, MD;  Location: [facility] ORS;  Service: Orthopedics;  Laterality: Right;  . 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   Procedure: TOTAL KNEE ARTHROPLASTY;  Surgeon: Corky Mull, MD;  Location: [facility] ORS;  Service: Orthopedics;  Laterality: Left;    There were no vitals filed for this visit.   Subjective Assessment - 02/07/20 1314    Subjective She reports having pain in left low back.    Pertinent History Pt reports sustaining a fall beginning of April 2021,  landed on bilateral knees and hands. Has since had bilateral thigh/hip pain. Prior to event, had difficulty with balance but subsequently worse. History of dizziness resolved stopping gabapentin. Pt reports dizziness is rare, once every 6-8 weeks, feels like she is about to "pass out" and then it goes away. Pt denies LOC. Symptoms come on whilst sitting. No known aggravating factors. "It never occurs because of motion." Denies any true vertigo. She saw cardiology and had a Holter monitored which per cardiology, study offered no remarkable findings. Report mentioned infrequent PVC, several runs of SVT, longest of which was 29 beats. No pauses. No symptoms reported. She had a brain MRI 09/02/19 with "no abnormality seen to explain the clinical presentation." Denies history of HA, CP. Reports intermittent numbness in the R hand. Denies any focal weakness.    Limitations Walking    Diagnostic tests See history    Patient Stated Goals Improve balance    Currently in Pain? Yes    Pain Score 9     Pain Location Back    Pain Orientation Left    Pain Descriptors / Indicators Aching    Pain Type Chronic pain    Pain Radiating Towards hips    Pain Onset More than a month ago    Aggravating Factors  walking    Pain Relieving Factors heat    Effect of  Pain on Daily Activities poor tolerance for activities           Therapeutic exercise:  Nu-step x 5 mins , LE only due to right shoulder hurting Supine: Glut sets x 5 sec x 20  SLR x 20 BLE, 3 sec hold Hookling marching x 15  Hooklying abd/ER x 15  Bridging x 15 SAQ x 15 BLE Hip abd/add x 15, BLE Heel slides x 15, BLE Trunk rotation left and right x 20  Patient performed with instruction, verbal cues, tactile cues of therapist: goal: increase tissue extensibility, promote proper posture, improve mobility                              PT Education - 02/07/20 1316    Education Details HEP    Person(s) Educated Patient     Methods Explanation    Comprehension Verbalized understanding            PT Short Term Goals - 01/18/20 1435      PT SHORT TERM GOAL #1   Title Pt will be independent with HEP in order to improve strength and balance in order to decrease fall risk and improve function at home.    Time 4    Period Weeks    Status Partially Met    Target Date 12/20/19             PT Long Term Goals - 01/18/20 1435      PT LONG TERM GOAL #1   Title Pt will improve BERG by at least 3 points in order to demonstrate clinically significant improvement in balance.    Baseline 11/22/19: 47/56, 01/18/20= 52/56    Time 8    Period Weeks    Status Partially Met    Target Date 03/13/20      PT LONG TERM GOAL #2   Title Pt will improve ABC by at least 13% in order to demonstrate clinically significant improvement in balance confidence.    Baseline 11/22/19: 35.6%, 01/18/20=    Time 8    Period Weeks    Status Partially Met    Target Date 03/13/20      PT LONG TERM GOAL #3   Title Pt will decrease TUG to below 14 seconds/decrease in order to demonstrate decreased fall risk.    Baseline 11/22/19: 16.1s, 01/18/20=14 sec,10.26 sec    Time 8    Period Weeks    Status Partially Met    Target Date 03/13/20      PT LONG TERM GOAL #4   Title Patient will improve with FOTO score to demonstrate improved functional mobility    Time 8    Period Weeks    Status New    Target Date 03/13/20                 Plan - 02/07/20 1317    Clinical Impression Statement  Pt was able to perform all exercises today with CGA.Marland Kitchen Pt was able to perform all  strength exercises, without pain behaviors.  Pt requires verbal, visual and tactile cues during exercise in order to complete tasks with proper form and technique.  Pt would continue to benefit from skilled PT services in order to further strengthen LE's, improve static and dynamic balance, and improve coordination in order to increase functional mobility and decrease risk  of falls   Personal Factors and Comorbidities Age;Comorbidity 3+;Past/Current Experience    Comorbidities  HTN, OA, OSA, hypothyroidism    Examination-Activity Limitations Squat;Stand;Stairs;Transfers    Examination-Participation Restrictions Church;Community Activity;Cleaning    Stability/Clinical Decision Making Evolving/Moderate complexity    Rehab Potential Good    PT Frequency 2x / week    PT Duration 8 weeks    PT Treatment/Interventions ADLs/Self Care Home Management;Aquatic Therapy;Canalith Repostioning;Cryotherapy;Electrical Stimulation;Iontophoresis 43m/ml Dexamethasone;Moist Heat;Traction;Ultrasound;DME Instruction;Gait training;Functional mobility training;Stair training;Therapeutic activities;Therapeutic exercise;Balance training;Neuromuscular re-education;Patient/family education;Manual techniques;Passive range of motion;Dry needling;Vestibular;Spinal Manipulations;Joint Manipulations    PT Next Visit Plan Review HEP, strength and balance exercises    PT Home Exercise Plan Medbridge Access Code: [email or web address](seated clams with tband, seated marching with tband, standing mini squats)    Consulted and Agree with Plan of Care Patient           Patient will benefit from skilled therapeutic intervention in order to improve the following deficits and impairments:  Abnormal gait, Decreased balance, Decreased strength, Pain  Visit Diagnosis: Unsteadiness on feet  Muscle weakness (generalized)  Localized edema     Problem List Patient Active Problem List   Diagnosis Date Noted  . 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 Chronic left-sided low back pain with left-sided sciatica 06/22/2018  . Vitamin B 12 deficiency 01/02/2018  . Chronic anemia 09/06/2017  . Chronic constipation 06/08/2017  . Lumbar radiculopathy 05/22/2017  . Spinal stenosis of lumbosacral region 03/19/2017  . 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 Acute  bilateral low back pain without sciatica 02/04/2017  . Dysuria 12/13/2016  . Prediabetes 11/05/2016  . Acquired hypothyroidism 08/24/2016  . Myofascial pain syndrome 08/24/2016  . Essential hypertension 08/24/2016  . Osteopenia of multiple sites 08/24/2016  . Lumbar degenerative disc disease 08/24/2016  . Bilateral hydronephrosis 07/15/2016  . Rotator cuff tendinitis, left 06/21/2016  . 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 Rotator cuff tendinitis, right 11/27/2015  . 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  . Injury of tendon of long head of right biceps 11/27/2015  . Incomplete tear of right rotator cuff 11/27/2015  . Obstructive sleep apnea on CPAP 07/13/2015  . Osteoarthritis 11/04/2013  . Disorder of uterus 04/29/1995    MAlanson Puls PVirginiaDPT 02/07/2020, 1:17 PM  [place] [facility] [address] [address]Phone: [phone]  Fax:  [phone] Name: Erika COLTRAINMRN: [id number] of Birth: 611-14-1942

## 2020-02-08 ENCOUNTER — Ambulatory Visit: Payer: Medicare Other | Admitting: Physical Therapy

## 2020-02-09 ENCOUNTER — Ambulatory Visit: Payer: Medicare Other | Admitting: Physical Therapy

## 2020-02-10 ENCOUNTER — Ambulatory Visit: Payer: Medicare Other | Admitting: Physical Therapy

## 2020-02-10 ENCOUNTER — Encounter: Payer: Self-pay | Admitting: Physical Therapy

## 2020-02-10 ENCOUNTER — Other Ambulatory Visit: Payer: Self-pay

## 2020-02-10 DIAGNOSIS — R2681 Unsteadiness on feet: Secondary | ICD-10-CM

## 2020-02-10 DIAGNOSIS — M6281 Muscle weakness (generalized): Secondary | ICD-10-CM

## 2020-02-10 DIAGNOSIS — R600 Localized edema: Secondary | ICD-10-CM

## 2020-02-10 NOTE — Therapy (Signed)
[place] MAIN [facility] SERVICES [address] [place], [place], [address] Phone: [phone]   Fax:  [phone]  Physical Therapy Treatment  Patient Details  Name: Erika Crawford MRN: [id number] Date of Birth: 01-Dec-1940 Referring Provider (PT): Dr. Melrose Nakayama   Encounter Date: 02/10/2020   PT End of Session - 02/10/20 1142    Visit Number 14    Number of Visits 25    Date for PT Re-Evaluation 03/13/20    Authorization Type eval: 11/22/19, FOTO completed    PT Start Time 1140    PT Stop Time 1220    PT Time Calculation (min) 40 min    Equipment Utilized During Treatment Gait belt    Activity Tolerance Patient tolerated treatment well;No increased pain    Behavior During Therapy WFL for tasks assessed/performed           Past Medical History:  Diagnosis Date  . Arthritis   . Complication of anesthesia    dizziness  . Hypertension   . Hypothyroidism   . Sleep apnea    cpap  . Thyroid disease     Past Surgical History:  Procedure Laterality Date  . ABDOMINAL HYSTERECTOMY    . BACK SURGERY     lower middle kyphoplasty  . COLONOSCOPY WITH PROPOFOL N/A 09/21/2015   Procedure: COLONOSCOPY WITH PROPOFOL;  Surgeon: Hulen Luster, MD;  Location: [facility] ENDOSCOPY;  Service: Gastroenterology;  Laterality: N/A;  . JOINT REPLACEMENT     03/19/2016- left knee replacement  . REVERSE SHOULDER ARTHROPLASTY Right 08/25/2018   Procedure: REVERSE SHOULDER ARTHROPLASTY, RIGHT;  Surgeon: Corky Mull, MD;  Location: [facility] ORS;  Service: Orthopedics;  Laterality: Right;  . 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   Procedure: TOTAL KNEE ARTHROPLASTY;  Surgeon: Corky Mull, MD;  Location: [facility] ORS;  Service: Orthopedics;  Laterality: Left;    There were no vitals filed for this visit.   Subjective Assessment - 02/10/20 1141    Subjective She reports having pain in left low back.    Pertinent History Pt reports sustaining a fall beginning of April 2021,  landed on bilateral knees and hands. Has since had bilateral thigh/hip pain. Prior to event, had difficulty with balance but subsequently worse. History of dizziness resolved stopping gabapentin. Pt reports dizziness is rare, once every 6-8 weeks, feels like she is about to "pass out" and then it goes away. Pt denies LOC. Symptoms come on whilst sitting. No known aggravating factors. "It never occurs because of motion." Denies any true vertigo. She saw cardiology and had a Holter monitored which per cardiology, study offered no remarkable findings. Report mentioned infrequent PVC, several runs of SVT, longest of which was 29 beats. No pauses. No symptoms reported. She had a brain MRI 09/02/19 with "no abnormality seen to explain the clinical presentation." Denies history of HA, CP. Reports intermittent numbness in the R hand. Denies any focal weakness.    Limitations Walking    Diagnostic tests See history    Patient Stated Goals Improve balance    Currently in Pain? Yes    Pain Score 6     Pain Orientation Left    Pain Descriptors / Indicators Aching    Pain Type Chronic pain    Pain Radiating Towards hips    Pain Onset More than a month ago    Pain Frequency Constant    Aggravating Factors  walking    Pain Relieving Factors heat    Effect of  Pain on Daily Activities poor tolerance to activity             Therapeutic exercise:  Nu-step x 5 mins , LE only due to right shoulder hurting Supine: Glut sets x 5 sec x 20  SLR x 20 BLE, 3 sec hold Hookling marching x 15 , with TA hold Hooklying abd/ER x 15  Bridging x 15 SAQ x 15 BLE Hip abd/add x 15, BLE Heel slides x 15, BLE Trunk rotation left and right x 20  Patient performed with instruction, verbal cues, tactile cues of therapist: goal:increase tissue extensibility, promote proper posture, improve mobility                         PT Education - 02/10/20 1141    Education Details HEP    Person(s) Educated  Patient    Methods Explanation    Comprehension Verbalized understanding            PT Short Term Goals - 01/18/20 1435      PT SHORT TERM GOAL #1   Title Pt will be independent with HEP in order to improve strength and balance in order to decrease fall risk and improve function at home.    Time 4    Period Weeks    Status Partially Met    Target Date 12/20/19             PT Long Term Goals - 01/18/20 1435      PT LONG TERM GOAL #1   Title Pt will improve BERG by at least 3 points in order to demonstrate clinically significant improvement in balance.    Baseline 11/22/19: 47/56, 01/18/20= 52/56    Time 8    Period Weeks    Status Partially Met    Target Date 03/13/20      PT LONG TERM GOAL #2   Title Pt will improve ABC by at least 13% in order to demonstrate clinically significant improvement in balance confidence.    Baseline 11/22/19: 35.6%, 01/18/20=    Time 8    Period Weeks    Status Partially Met    Target Date 03/13/20      PT LONG TERM GOAL #3   Title Pt will decrease TUG to below 14 seconds/decrease in order to demonstrate decreased fall risk.    Baseline 11/22/19: 16.1s, 01/18/20=14 sec,10.26 sec    Time 8    Period Weeks    Status Partially Met    Target Date 03/13/20      PT LONG TERM GOAL #4   Title Patient will improve with FOTO score to demonstrate improved functional mobility    Time 8    Period Weeks    Status New    Target Date 03/13/20                 Plan - 02/10/20 1142    Clinical Impression Statement Patient has 7/10 L low back pain and is able to perform gentle AROM exercises for LE and trunk strengthening. She has less back pain following treatment. She will continue to benefit from skilled PT to improve mobility and strength.    Personal Factors and Comorbidities Age;Comorbidity 3+;Past/Current Experience    Comorbidities HTN, OA, OSA, hypothyroidism    Examination-Activity Limitations Squat;Stand;Stairs;Transfers     Examination-Participation Restrictions Church;Community Activity;Cleaning    Stability/Clinical Decision Making Evolving/Moderate complexity    Rehab Potential Good    PT Frequency 2x /  week    PT Duration 8 weeks    PT Treatment/Interventions ADLs/Self Care Home Management;Aquatic Therapy;Canalith Repostioning;Cryotherapy;Electrical Stimulation;Iontophoresis 76m/ml Dexamethasone;Moist Heat;Traction;Ultrasound;DME Instruction;Gait training;Functional mobility training;Stair training;Therapeutic activities;Therapeutic exercise;Balance training;Neuromuscular re-education;Patient/family education;Manual techniques;Passive range of motion;Dry needling;Vestibular;Spinal Manipulations;Joint Manipulations    PT Next Visit Plan Review HEP, strength and balance exercises    PT Home Exercise Plan Medbridge Access Code: [email or web address](seated clams with tband, seated marching with tband, standing mini squats)    Consulted and Agree with Plan of Care Patient           Patient will benefit from skilled therapeutic intervention in order to improve the following deficits and impairments:  Abnormal gait, Decreased balance, Decreased strength, Pain  Visit Diagnosis: Unsteadiness on feet  Muscle weakness (generalized)  Localized edema     Problem List Patient Active Problem List   Diagnosis Date Noted  . 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 Chronic left-sided low back pain with left-sided sciatica 06/22/2018  . Vitamin B 12 deficiency 01/02/2018  . Chronic anemia 09/06/2017  . Chronic constipation 06/08/2017  . Lumbar radiculopathy 05/22/2017  . Spinal stenosis of lumbosacral region 03/19/2017  . 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 Acute bilateral low back pain without sciatica 02/04/2017  . Dysuria 12/13/2016  . Prediabetes 11/05/2016  . Acquired hypothyroidism 08/24/2016  . Myofascial pain syndrome 08/24/2016  . Essential hypertension  08/24/2016  . Osteopenia of multiple sites 08/24/2016  . Lumbar degenerative disc disease 08/24/2016  . Bilateral hydronephrosis 07/15/2016  . Rotator cuff tendinitis, left 06/21/2016  . 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 Rotator cuff tendinitis, right 11/27/2015  . 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  . Injury of tendon of long head of right biceps 11/27/2015  . Incomplete tear of right rotator cuff 11/27/2015  . Obstructive sleep apnea on CPAP 07/13/2015  . Osteoarthritis 11/04/2013  . Disorder of uterus 04/29/1995    MAlanson Puls PVirginiaDPT 02/10/2020, 11:43 AM  [place]MAIN [facility] - BSERVICES [address] [address]Phone: [phone]  Fax:  [phone] Name: Erika ROEHMMRN: [id number] of Birth: [birth year]

## 2020-02-14 ENCOUNTER — Ambulatory Visit: Payer: Medicare Other | Admitting: Physical Therapy

## 2020-02-15 ENCOUNTER — Encounter: Payer: Self-pay | Admitting: Physical Therapy

## 2020-02-15 ENCOUNTER — Ambulatory Visit: Payer: Medicare Other | Admitting: Physical Therapy

## 2020-02-15 ENCOUNTER — Other Ambulatory Visit: Payer: Self-pay

## 2020-02-15 DIAGNOSIS — M6281 Muscle weakness (generalized): Secondary | ICD-10-CM

## 2020-02-15 DIAGNOSIS — R600 Localized edema: Secondary | ICD-10-CM

## 2020-02-15 DIAGNOSIS — R2681 Unsteadiness on feet: Secondary | ICD-10-CM

## 2020-02-15 NOTE — Therapy (Signed)
[place] MAIN [facility] SERVICES [address] [place], [place], [place] Phone: [phone]   Fax:  [phone]  Physical Therapy Treatment  Patient Details  Name: Erika Crawford MRN: [id number] Date of Birth: 03/08/1941 Referring Provider (PT): Dr. Melrose Nakayama   Encounter Date: 02/15/2020   PT End of Session - 02/15/20 1456    Visit Number 15    Number of Visits 25    Date for PT Re-Evaluation 03/13/20    Authorization Type eval: 11/22/19, FOTO completed    PT Start Time 1140    PT Stop Time 1220    PT Time Calculation (min) 40 min    Equipment Utilized During Treatment Gait belt    Activity Tolerance Patient tolerated treatment well;No increased pain    Behavior During Therapy WFL for tasks assessed/performed           Past Medical History:  Diagnosis Date  . Arthritis   . Complication of anesthesia    dizziness  . Hypertension   . Hypothyroidism   . Sleep apnea    cpap  . Thyroid disease     Past Surgical History:  Procedure Laterality Date  . ABDOMINAL HYSTERECTOMY    . BACK SURGERY     lower middle kyphoplasty  . COLONOSCOPY WITH PROPOFOL N/A 09/21/2015   Procedure: COLONOSCOPY WITH PROPOFOL;  Surgeon: Hulen Luster, MD;  Location: [facility] ENDOSCOPY;  Service: Gastroenterology;  Laterality: N/A;  . JOINT REPLACEMENT     03/19/2016- left knee replacement  . REVERSE SHOULDER ARTHROPLASTY Right 08/25/2018   Procedure: REVERSE SHOULDER ARTHROPLASTY, RIGHT;  Surgeon: Corky Mull, MD;  Location: [facility] ORS;  Service: Orthopedics;  Laterality: Right;  . 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   Procedure: TOTAL KNEE ARTHROPLASTY;  Surgeon: Corky Mull, MD;  Location: [facility] ORS;  Service: Orthopedics;  Laterality: Left;    There were no vitals filed for this visit.   Subjective Assessment - 02/15/20 1451    Subjective She reports having pain in left low back.    Pertinent History Pt reports sustaining a fall beginning of April 2021,  landed on bilateral knees and hands. Has since had bilateral thigh/hip pain. Prior to event, had difficulty with balance but subsequently worse. History of dizziness resolved stopping gabapentin. Pt reports dizziness is rare, once every 6-8 weeks, feels like she is about to "pass out" and then it goes away. Pt denies LOC. Symptoms come on whilst sitting. No known aggravating factors. "It never occurs because of motion." Denies any true vertigo. She saw cardiology and had a Holter monitored which per cardiology, study offered no remarkable findings. Report mentioned infrequent PVC, several runs of SVT, longest of which was 29 beats. No pauses. No symptoms reported. She had a brain MRI 09/02/19 with "no abnormality seen to explain the clinical presentation." Denies history of HA, CP. Reports intermittent numbness in the R hand. Denies any focal weakness.    Limitations Walking    Diagnostic tests See history    Patient Stated Goals Improve balance    Currently in Pain? Yes    Pain Score 5     Pain Location Back    Pain Onset More than a month ago           Therapeutic exercise:  Nu-step  x 5 mins L 4  Supine: Trunk rotation left and right x 15  Hookling marching x 15  Hooklying abd/ER x 15  Bridging x 15 SAQ x 15 BLE  Hip abd/add x 15, BLE Heel slides x 15, BLE Sit to stand x 5  Sidelying: Hip abd x 15 , BLE  Patient performed with instruction, verbal cues, tactile cues of therapist: goal: increase tissue extensibility, promote proper posture, improve mobility                              PT Education - 02/15/20 1452    Education Details HEP    Person(s) Educated Patient    Methods Explanation    Comprehension Verbalized understanding            PT Short Term Goals - 01/18/20 1435      PT SHORT TERM GOAL #1   Title Pt will be independent with HEP in order to improve strength and balance in order to decrease fall risk and improve function at home.     Time 4    Period Weeks    Status Partially Met    Target Date 12/20/19             PT Long Term Goals - 01/18/20 1435      PT LONG TERM GOAL #1   Title Pt will improve BERG by at least 3 points in order to demonstrate clinically significant improvement in balance.    Baseline 11/22/19: 47/56, 01/18/20= 52/56    Time 8    Period Weeks    Status Partially Met    Target Date 03/13/20      PT LONG TERM GOAL #2   Title Pt will improve ABC by at least 13% in order to demonstrate clinically significant improvement in balance confidence.    Baseline 11/22/19: 35.6%, 01/18/20=    Time 8    Period Weeks    Status Partially Met    Target Date 03/13/20      PT LONG TERM GOAL #3   Title Pt will decrease TUG to below 14 seconds/decrease in order to demonstrate decreased fall risk.    Baseline 11/22/19: 16.1s, 01/18/20=14 sec,10.26 sec    Time 8    Period Weeks    Status Partially Met    Target Date 03/13/20      PT LONG TERM GOAL #4   Title Patient will improve with FOTO score to demonstrate improved functional mobility    Time 8    Period Weeks    Status New    Target Date 03/13/20                 Plan - 02/15/20 1457    Clinical Impression Statement  Pt was able to perform all exercises today with CGA.Marland Kitchen Pt was able to perform all  strength exercises, demonstrating improvements in LE strength and stability.  .  Pt requires verbal, visual and tactile cues during exercise in order to complete tasks with proper form and technique.  Pt would continue to benefit from skilled PT services in order to further strengthen LE's, improve static and dynamic balance, and improve coordination in order to increase functional mobility and decrease risk of falls   Personal Factors and Comorbidities Age;Comorbidity 3+;Past/Current Experience    Comorbidities HTN, OA, OSA, hypothyroidism    Examination-Activity Limitations Squat;Stand;Stairs;Transfers    Examination-Participation Restrictions  Church;Community Activity;Cleaning    Stability/Clinical Decision Making Evolving/Moderate complexity    Rehab Potential Good    PT Frequency 2x / week    PT Duration 8 weeks    PT Treatment/Interventions ADLs/Self Care Home  Management;Aquatic Therapy;Canalith Repostioning;Cryotherapy;Electrical Stimulation;Iontophoresis 57m/ml Dexamethasone;Moist Heat;Traction;Ultrasound;DME Instruction;Gait training;Functional mobility training;Stair training;Therapeutic activities;Therapeutic exercise;Balance training;Neuromuscular re-education;Patient/family education;Manual techniques;Passive range of motion;Dry needling;Vestibular;Spinal Manipulations;Joint Manipulations    PT Next Visit Plan Review HEP, strength and balance exercises    PT Home Exercise Plan Medbridge Access Code: [email or web address](seated clams with tband, seated marching with tband, standing mini squats)    Consulted and Agree with Plan of Care Patient           Patient will benefit from skilled therapeutic intervention in order to improve the following deficits and impairments:  Abnormal gait, Decreased balance, Decreased strength, Pain  Visit Diagnosis: Unsteadiness on feet  Muscle weakness (generalized)  Localized edema     Problem List Patient Active Problem List   Diagnosis Date Noted  . 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 Chronic left-sided low back pain with left-sided sciatica 06/22/2018  . Vitamin B 12 deficiency 01/02/2018  . Chronic anemia 09/06/2017  . Chronic constipation 06/08/2017  . Lumbar radiculopathy 05/22/2017  . Spinal stenosis of lumbosacral region 03/19/2017  . 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 Acute bilateral low back pain without sciatica 02/04/2017  . Dysuria 12/13/2016  . Prediabetes 11/05/2016  . Acquired hypothyroidism 08/24/2016  . Myofascial pain syndrome 08/24/2016  . Essential hypertension 08/24/2016  . Osteopenia of multiple  sites 08/24/2016  . Lumbar degenerative disc disease 08/24/2016  . Bilateral hydronephrosis 07/15/2016  . Rotator cuff tendinitis, left 06/21/2016  . 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 Rotator cuff tendinitis, right 11/27/2015  . 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  . Injury of tendon of long head of right biceps 11/27/2015  . Incomplete tear of right rotator cuff 11/27/2015  . Obstructive sleep apnea on CPAP 07/13/2015  . Osteoarthritis 11/04/2013  . Disorder of uterus 04/29/1995    [place] PVirginiaDPT 02/15/2020, 2:58 PM  [place] [facility] [address] [address]Phone: [phone]  Fax:  [phone] Name: MNOVIE MAGGIOMRN: [id number] of Birth: [birth year]

## 2020-02-16 ENCOUNTER — Ambulatory Visit: Payer: Medicare Other | Admitting: Physical Therapy

## 2020-02-17 ENCOUNTER — Ambulatory Visit: Payer: Medicare Other | Admitting: Physical Therapy

## 2020-02-23 ENCOUNTER — Ambulatory Visit: Payer: Medicare Other | Admitting: Physical Therapy

## 2020-02-29 ENCOUNTER — Ambulatory Visit: Payer: Medicare Other | Attending: Surgery

## 2020-02-29 ENCOUNTER — Other Ambulatory Visit: Payer: Self-pay

## 2020-02-29 DIAGNOSIS — M6281 Muscle weakness (generalized): Secondary | ICD-10-CM | POA: Diagnosis present

## 2020-02-29 DIAGNOSIS — R600 Localized edema: Secondary | ICD-10-CM | POA: Diagnosis present

## 2020-02-29 DIAGNOSIS — R2681 Unsteadiness on feet: Secondary | ICD-10-CM

## 2020-02-29 NOTE — Therapy (Signed)
[place] [facility] REH[facility] SERVICES [address] [place], [place], [address] Phone: [phone]   Fax:  [phone]  Physical Therapy Treatment  Patient Details  Name: [place] MRN: [id number] Date of Birth: 07/30/1940 Referring Provider (PT): Dr. Potter   Encounter Date: 02/29/2020   PT End of Session - 02/29/20 1438    Visit Number 16    Number of Visits 25    Date for PT Re-Evaluation 03/13/20    Authorization Type eval: 11/22/19, FOTO completed    PT Start Time 1431    PT Stop Time 1515    PT Time Calculation (min) 44 min    Equipment Utilized During Treatment Gait belt    Activity Tolerance Patient tolerated treatment well;No increased pain    Behavior During Therapy WFL for tasks assessed/performed           Past Medical History:  Diagnosis Date  . Arthritis   . Complication of anesthesia    dizziness  . Hypertension   . Hypothyroidism   . Sleep apnea    cpap  . Thyroid disease     Past Surgical History:  Procedure Laterality Date  . ABDOMINAL HYSTERECTOMY    . BACK SURGERY     lower middle kyphoplasty  . COLONOSCOPY WITH PROPOFOL N/A 09/21/2015   Procedure: COLONOSCOPY WITH PROPOFOL;  Surgeon: Paul Y Oh, MD;  Location: [facility] ENDOSCOPY;  Service: Gastroenterology;  Laterality: N/A;  . JOINT REPLACEMENT     03/19/2016- left knee replacement  . REVERSE SHOULDER ARTHROPLASTY Right 08/25/2018   Procedure: REVERSE SHOULDER ARTHROPLASTY, RIGHT;  Surgeon: Poggi, John J, MD;  Location: [facility] ORS;  Service: Orthopedics;  Laterality: Right;  . 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   Procedure: TOTAL KNEE ARTHROPLASTY;  Surgeon: John J Poggi, MD;  Location: [facility] ORS;  Service: Orthopedics;  Laterality: Left;    There were no vitals filed for this visit.   Subjective Assessment - 02/29/20 1419    Subjective Patient reports she is doing all right today.  She continues with her chronic low back pain which she rates as an 8 out of  10 upon arrival.  No specific questions or concerns.    Pertinent History Pt reports sustaining a fall beginning of April 2021, landed on bilateral knees and hands. Has since had bilateral thigh/hip pain. Prior to event, had difficulty with balance but subsequently worse. History of dizziness resolved stopping gabapentin. Pt reports dizziness is rare, once every 6-8 weeks, feels like she is about to "pass out" and then it goes away. Pt denies LOC. Symptoms come on whilst sitting. No known aggravating factors. "It never occurs because of motion." Denies any true vertigo. She saw cardiology and had a Holter monitored which per cardiology, study offered no remarkable findings. Report mentioned infrequent PVC, several runs of SVT, longest of which was 29 beats. No pauses. No symptoms reported. She had a brain MRI 09/02/19 with "no abnormality seen to explain the clinical presentation." Denies history of HA, CP. Reports intermittent numbness in the R hand. Denies any focal weakness.    Limitations Walking    Diagnostic tests See history    Patient Stated Goals Improve balance    Currently in Pain? Yes    Pain Score 8     Pain Location Back    Pain Orientation --   central   Pain Descriptors / Indicators Aching    Pain Type Chronic pain    Pain Onset More than a   month ago    Pain Frequency Constant    Multiple Pain Sites No              TREATMENT   Ther-ex  Nustep L2-3 x 5 mins for warm-up during history, BLE only (4 minutes unbilled); Sit to stand without upper extremity support with Airex pad under feet 2x5;  2.5# ankle weightwith BUE support: Hip flexion marches x 20 BLE Hip abduction x 20 BLE; Hip extension x 20 BLE; HS curls x 20 BLE;   Neuromuscular Re-education  Airex NBOS eyes open/eyes closed x30 seconds each without upper extremity support; Airex NBOS eyes open with horizontal and vertical head turns x30 seconds each without extremity support; Airex alternating 5 inch step  taps without upper extremity support x10 bilateral; Staggered stance balance with rear foot on Airex in front foot on 5 inch step without upper extremity support x 30s each; Alternating 5 inch step ups/downs from Airex pad without upper sextremity support x 5 leading with each lower extremity;   Patient performed with instruction, verbal cues, tactile cues of therapist: goal:increase tissue extensibility, promote proper posture, improve mobility    Pt was able to perform all exercises today with CGA. She was able to perform all  strength exercises, demonstrating improvements in LE strength and stability.  Notable fatigue with sit to stands and Airex pad under feet.  Pt requires verbal, visual and tactile cues during exercise in order to complete tasks with proper form and technique.  Pt would continue to benefit from skilled PT services in order to further strengthen LE's, improve static and dynamic balance, and improve coordination in order to increase functional mobility and decrease risk of falls.                      PT Short Term Goals - 01/18/20 1435      PT SHORT TERM GOAL #1   Title Pt will be independent with HEP in order to improve strength and balance in order to decrease fall risk and improve function at home.    Time 4    Period Weeks    Status Partially Met    Target Date 12/20/19             PT Long Term Goals - 01/18/20 1435      PT LONG TERM GOAL #1   Title Pt will improve BERG by at least 3 points in order to demonstrate clinically significant improvement in balance.    Baseline 11/22/19: 47/56, 01/18/20= 52/56    Time 8    Period Weeks    Status Partially Met    Target Date 03/13/20      PT LONG TERM GOAL #2   Title Pt will improve ABC by at least 13% in order to demonstrate clinically significant improvement in balance confidence.    Baseline 11/22/19: 35.6%, 01/18/20=    Time 8    Period Weeks    Status Partially Met    Target Date 03/13/20       PT LONG TERM GOAL #3   Title Pt will decrease TUG to below 14 seconds/decrease in order to demonstrate decreased fall risk.    Baseline 11/22/19: 16.1s, 01/18/20=14 sec,10.26 sec    Time 8    Period Weeks    Status Partially Met    Target Date 03/13/20      PT LONG TERM GOAL #4   Title Patient will improve with FOTO score to demonstrate improved functional   mobility    Time 8    Period Weeks    Status New    Target Date 03/13/20                 Plan - 02/29/20 1438    Clinical Impression Statement Pt was able to perform all exercises today with CGA. She was able to perform all  strength exercises, demonstrating improvements in LE strength and stability.  Notable fatigue with sit to stands and Airex pad under feet.  Pt requires verbal, visual and tactile cues during exercise in order to complete tasks with proper form and technique.  Pt would continue to benefit from skilled PT services in order to further strengthen LE's, improve static and dynamic balance, and improve coordination in order to increase functional mobility and decrease risk of falls.    Personal Factors and Comorbidities Age;Comorbidity 3+;Past/Current Experience    Comorbidities HTN, OA, OSA, hypothyroidism    Examination-Activity Limitations Squat;Stand;Stairs;Transfers    Examination-Participation Restrictions Church;Community Activity;Cleaning    Stability/Clinical Decision Making Evolving/Moderate complexity    Rehab Potential Good    PT Frequency 2x / week    PT Duration 8 weeks    PT Treatment/Interventions ADLs/Self Care Home Management;Aquatic Therapy;Canalith Repostioning;Cryotherapy;Electrical Stimulation;Iontophoresis 4mg/ml Dexamethasone;Moist Heat;Traction;Ultrasound;DME Instruction;Gait training;Functional mobility training;Stair training;Therapeutic activities;Therapeutic exercise;Balance training;Neuromuscular re-education;Patient/family education;Manual techniques;Passive range of motion;Dry  needling;Vestibular;Spinal Manipulations;Joint Manipulations    PT Next Visit Plan Review HEP, strength and balance exercises    PT Home Exercise Plan Medbridge Access Code: [email or web address] (seated clams with tband, seated marching with tband, standing mini squats)    Consulted and Agree with Plan of Care Patient           Patient will benefit from skilled therapeutic intervention in order to improve the following deficits and impairments:  Abnormal gait, Decreased balance, Decreased strength, Pain  Visit Diagnosis: Unsteadiness on feet  Muscle weakness (generalized)     Problem List Patient Active Problem List   Diagnosis Date Noted  . 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 Chronic left-sided low back pain with left-sided sciatica 06/22/2018  . Vitamin B 12 deficiency 01/02/2018  . Chronic anemia 09/06/2017  . Chronic constipation 06/08/2017  . Lumbar radiculopathy 05/22/2017  . Spinal stenosis of lumbosacral region 03/19/2017  . 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 Acute bilateral low back pain without sciatica 02/04/2017  . Dysuria 12/13/2016  . Prediabetes 11/05/2016  . Acquired hypothyroidism 08/24/2016  . Myofascial pain syndrome 08/24/2016  . Essential hypertension 08/24/2016  . Osteopenia of multiple sites 08/24/2016  . Lumbar degenerative disc disease 08/24/2016  . Bilateral hydronephrosis 07/15/2016  . Rotator cuff tendinitis, left 06/21/2016  . 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 Rotator cuff tendinitis, right 11/27/2015  . 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  . Injury of tendon of long head of right biceps 11/27/2015  . Incomplete tear of right rotator cuff 11/27/2015  . Obstructive sleep apnea on CPAP 07/13/2015  . Osteoarthritis 11/04/2013  . Disorder of uterus 04/29/1995   Jason D Huprich PT, DPT, GCS  Huprich,Jason 03/01/2020, 12:05 PM  [facility] [address] [place], [place], [address] Phone: [phone]   Fax:  [phone]  Name: E[place] MRN: [id number] Date of Birth: 02/28/1941   

## 2020-03-02 ENCOUNTER — Ambulatory Visit: Payer: Medicare Other | Admitting: Physical Therapy

## 2020-03-02 ENCOUNTER — Encounter: Payer: Self-pay | Admitting: Physical Therapy

## 2020-03-02 ENCOUNTER — Other Ambulatory Visit: Payer: Self-pay

## 2020-03-02 DIAGNOSIS — R600 Localized edema: Secondary | ICD-10-CM

## 2020-03-02 DIAGNOSIS — R2681 Unsteadiness on feet: Secondary | ICD-10-CM | POA: Diagnosis not present

## 2020-03-02 DIAGNOSIS — M6281 Muscle weakness (generalized): Secondary | ICD-10-CM

## 2020-03-02 NOTE — Therapy (Signed)
[place] MAIN [facility] SERVICES [address] [place], [place], [address] Phone: [phone]   Fax:  [phone]  Physical Therapy Treatment  Patient Details  Name: Erika Crawford MRN: [id number] Date of Birth: [birth year] Referring Provider (PT): Dr. Melrose Nakayama   Encounter Date: 03/02/2020   PT End of Session - 03/02/20 1539    Visit Number 17    Number of Visits 25    Date for PT Re-Evaluation 03/13/20    Authorization Type eval: 11/22/19, FOTO completed    PT Start Time 1430    PT Stop Time 1515    PT Time Calculation (min) 45 min    Equipment Utilized During Treatment Gait belt    Activity Tolerance Patient tolerated treatment well;No increased pain    Behavior During Therapy WFL for tasks assessed/performed           Past Medical History:  Diagnosis Date  . Arthritis   . Complication of anesthesia    dizziness  . Hypertension   . Hypothyroidism   . Sleep apnea    cpap  . Thyroid disease     Past Surgical History:  Procedure Laterality Date  . ABDOMINAL HYSTERECTOMY    . BACK SURGERY     lower middle kyphoplasty  . COLONOSCOPY WITH PROPOFOL N/A 09/21/2015   Procedure: COLONOSCOPY WITH PROPOFOL;  Surgeon: Hulen Luster, MD;  Location: [facility] ENDOSCOPY;  Service: Gastroenterology;  Laterality: N/A;  . JOINT REPLACEMENT     03/19/2016- left knee replacement  . REVERSE SHOULDER ARTHROPLASTY Right 08/25/2018   Procedure: REVERSE SHOULDER ARTHROPLASTY, RIGHT;  Surgeon: Corky Mull, MD;  Location: [facility] ORS;  Service: Orthopedics;  Laterality: Right;  . 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   Procedure: TOTAL KNEE ARTHROPLASTY;  Surgeon: Corky Mull, MD;  Location: [facility] ORS;  Service: Orthopedics;  Laterality: Left;    There were no vitals filed for this visit.   Subjective Assessment - 03/02/20 1416    Subjective Patient reports she is doing all right today.  She continues with her chronic low back pain which she rates as an 8 out of  10 upon arrival.  No specific questions or concerns.    Pertinent History Pt reports sustaining a fall beginning of April 2021, landed on bilateral knees and hands. Has since had bilateral thigh/hip pain. Prior to event, had difficulty with balance but subsequently worse. History of dizziness resolved stopping gabapentin. Pt reports dizziness is rare, once every 6-8 weeks, feels like she is about to "pass out" and then it goes away. Pt denies LOC. Symptoms come on whilst sitting. No known aggravating factors. "It never occurs because of motion." Denies any true vertigo. She saw cardiology and had a Holter monitored which per cardiology, study offered no remarkable findings. Report mentioned infrequent PVC, several runs of SVT, longest of which was 29 beats. No pauses. No symptoms reported. She had a brain MRI 09/02/19 with "no abnormality seen to explain the clinical presentation." Denies history of HA, CP. Reports intermittent numbness in the R hand. Denies any focal weakness.    Limitations Walking    Diagnostic tests See history    Patient Stated Goals Improve balance    Pain Onset More than a month ago          Treatment: Nu-step x 5 mins L 1 LE only Neuromuscular Re-education  1/2 foam  fwd/bwd, side to side x 20 each direction Tandem gait on floor without UE support x 2  lengths Side stepping on floor without UE support x 2 lengths Heel/toe raises without UE support 3s hold x 10 each 1/2 foam roll balance with flat side up 30s x 2 reps 1/2 foam roll balance with flat side down 30s x 2 reps Hurdle stepping fwd/bwd, side to side Lateral side steps from foam to 6 inch stool left and right x 15 Backwards stepping from foam to 6 inch stool x 15  Ladder stepping fwd/bwd, side to side  x 10 ,cues for posture and stepping strategies, occasional LOB       Pt educated throughout session about proper posture and technique with exercises. Improved exercise technique, movement at target joints, use  of target muscles after min to mod verbal, visual, tactile cues. CGA and Min to mod verbal cues used throughout with increased in postural sway and LOB most seen with narrow base of support and while on uneven surfaces. Continues to have balance deficits typical with diagnosis.                         PT Education - 03/02/20 1523    Education Details balance exercises    Person(s) Educated Patient    Methods Explanation    Comprehension Verbalized understanding;Tactile cues required            PT Short Term Goals - 01/18/20 1435      PT SHORT TERM GOAL #1   Title Pt will be independent with HEP in order to improve strength and balance in order to decrease fall risk and improve function at home.    Time 4    Period Weeks    Status Partially Met    Target Date 12/20/19             PT Long Term Goals - 01/18/20 1435      PT LONG TERM GOAL #1   Title Pt will improve BERG by at least 3 points in order to demonstrate clinically significant improvement in balance.    Baseline 11/22/19: 47/56, 01/18/20= 52/56    Time 8    Period Weeks    Status Partially Met    Target Date 03/13/20      PT LONG TERM GOAL #2   Title Pt will improve ABC by at least 13% in order to demonstrate clinically significant improvement in balance confidence.    Baseline 11/22/19: 35.6%, 01/18/20=    Time 8    Period Weeks    Status Partially Met    Target Date 03/13/20      PT LONG TERM GOAL #3   Title Pt will decrease TUG to below 14 seconds/decrease in order to demonstrate decreased fall risk.    Baseline 11/22/19: 16.1s, 01/18/20=14 sec,10.26 sec    Time 8    Period Weeks    Status Partially Met    Target Date 03/13/20      PT LONG TERM GOAL #4   Title Patient will improve with FOTO score to demonstrate improved functional mobility    Time 8    Period Weeks    Status New    Target Date 03/13/20                 Plan - 03/02/20 1542    Clinical Impression Statement  Patient instructed in intermediate strengthening and balance exercise.  Patient requires min Vcs for correct exercise technique including to improve LE control with standing exercise. Patient demonstrates better ankle control with  SLS tasks with rail assist. Patient would benefit from additional skilled PT intervention to improve balance/gait safety and reduce fall risk.   Personal Factors and Comorbidities Age;Comorbidity 3+;Past/Current Experience    Comorbidities HTN, OA, OSA, hypothyroidism    Examination-Activity Limitations Squat;Stand;Stairs;Transfers    Examination-Participation Restrictions Church;Community Activity;Cleaning    Stability/Clinical Decision Making Evolving/Moderate complexity    Rehab Potential Good    PT Frequency 2x / week    PT Duration 8 weeks    PT Treatment/Interventions ADLs/Self Care Home Management;Aquatic Therapy;Canalith Repostioning;Cryotherapy;Electrical Stimulation;Iontophoresis 108m/ml Dexamethasone;Moist Heat;Traction;Ultrasound;DME Instruction;Gait training;Functional mobility training;Stair training;Therapeutic activities;Therapeutic exercise;Balance training;Neuromuscular re-education;Patient/family education;Manual techniques;Passive range of motion;Dry needling;Vestibular;Spinal Manipulations;Joint Manipulations    PT Next Visit Plan Review HEP, strength and balance exercises    PT Home Exercise Plan Medbridge Access Code: [email or web address](seated clams with tband, seated marching with tband, standing mini squats)    Consulted and Agree with Plan of Care Patient           Patient will benefit from skilled therapeutic intervention in order to improve the following deficits and impairments:  Abnormal gait, Decreased balance, Decreased strength, Pain  Visit Diagnosis: Unsteadiness on feet  Muscle weakness (generalized)  Localized edema     Problem List Patient Active Problem List   Diagnosis Date Noted  . Status post reverse total shoulder  replacement, right 08/25/2018  . Chronic left-sided low back pain with left-sided sciatica 06/22/2018  . Vitamin B 12 deficiency 01/02/2018  . Chronic anemia 09/06/2017  . Chronic constipation 06/08/2017  . Lumbar radiculopathy 05/22/2017  . Spinal stenosis of lumbosacral region 03/19/2017  . 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 Acute bilateral low back pain without sciatica 02/04/2017  . Dysuria 12/13/2016  . Prediabetes 11/05/2016  . Acquired hypothyroidism 08/24/2016  . Myofascial pain syndrome 08/24/2016  . Essential hypertension 08/24/2016  . Osteopenia of multiple sites 08/24/2016  . Lumbar degenerative disc disease 08/24/2016  . Bilateral hydronephrosis 07/15/2016  . Rotator cuff tendinitis, left 06/21/2016  . 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 Rotator cuff tendinitis, right 11/27/2015  . 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  . Injury of tendon of long head of right biceps 11/27/2015  . Incomplete tear of right rotator cuff 11/27/2015  . Obstructive sleep apnea on CPAP 07/13/2015  . Osteoarthritis 11/04/2013  . Disorder of uterus 04/29/1995    MAlanson Puls PVirginiaDPT 03/02/2020, 3:43 PM  CL[facility]rellMAIN [facility]SERV[facility] [address] [place] [address]Phone: [phone]  Fax:  [phone] Name: MMICKAYLA TROUTENMRN: [id number] of Birth: [birth year]

## 2020-03-07 ENCOUNTER — Ambulatory Visit: Payer: Medicare Other

## 2020-03-09 ENCOUNTER — Other Ambulatory Visit: Payer: Self-pay

## 2020-03-09 ENCOUNTER — Ambulatory Visit: Payer: Medicare Other | Admitting: Physical Therapy

## 2020-03-09 ENCOUNTER — Encounter: Payer: Self-pay | Admitting: Physical Therapy

## 2020-03-09 DIAGNOSIS — R2681 Unsteadiness on feet: Secondary | ICD-10-CM

## 2020-03-09 DIAGNOSIS — M6281 Muscle weakness (generalized): Secondary | ICD-10-CM

## 2020-03-09 DIAGNOSIS — R600 Localized edema: Secondary | ICD-10-CM

## 2020-03-09 NOTE — Therapy (Signed)
[place] MAIN [facility] SERVICES [address] [place], [place], [address] Phone: [phone]   Fax:  [phone]  Physical Therapy Treatment  Patient Details  Name: Erika Crawford MRN: [id number] Date of Birth: September 05, 1940 Referring Provider (PT): Dr. Melrose Nakayama   Encounter Date: 03/09/2020   PT End of Session - 03/09/20 1437    Visit Number 18    Number of Visits 25    Date for PT Re-Evaluation 03/13/20    Authorization Type eval: 11/22/19, FOTO completed    PT Start Time 1430    PT Stop Time 1515    PT Time Calculation (min) 45 min    Equipment Utilized During Treatment Gait belt    Activity Tolerance Patient tolerated treatment well;No increased pain    Behavior During Therapy WFL for tasks assessed/performed           Past Medical History:  Diagnosis Date  . Arthritis   . Complication of anesthesia    dizziness  . Hypertension   . Hypothyroidism   . Sleep apnea    cpap  . Thyroid disease     Past Surgical History:  Procedure Laterality Date  . ABDOMINAL HYSTERECTOMY    . BACK SURGERY     lower middle kyphoplasty  . COLONOSCOPY WITH PROPOFOL N/A 09/21/2015   Procedure: COLONOSCOPY WITH PROPOFOL;  Surgeon: Hulen Luster, MD;  Location: [facility] ENDOSCOPY;  Service: Gastroenterology;  Laterality: N/A;  . JOINT REPLACEMENT     03/19/2016- left knee replacement  . REVERSE SHOULDER ARTHROPLASTY Right 08/25/2018   Procedure: REVERSE SHOULDER ARTHROPLASTY, RIGHT;  Surgeon: Corky Mull, MD;  Location: [facility] ORS;  Service: Orthopedics;  Laterality: Right;  . 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   Procedure: TOTAL KNEE ARTHROPLASTY;  Surgeon: Corky Mull, MD;  Location: [facility] ORS;  Service: Orthopedics;  Laterality: Left;    There were no vitals filed for this visit.   Subjective Assessment - 03/09/20 1435    Subjective Patient reports she is having high paint today. Pain is in her groin R 8/10, 7/10 left side.    Pertinent History Pt  reports sustaining a fall beginning of April 2021, landed on bilateral knees and hands. Has since had bilateral thigh/hip pain. Prior to event, had difficulty with balance but subsequently worse. History of dizziness resolved stopping gabapentin. Pt reports dizziness is rare, once every 6-8 weeks, feels like she is about to "pass out" and then it goes away. Pt denies LOC. Symptoms come on whilst sitting. No known aggravating factors. "It never occurs because of motion." Denies any true vertigo. She saw cardiology and had a Holter monitored which per cardiology, study offered no remarkable findings. Report mentioned infrequent PVC, several runs of SVT, longest of which was 29 beats. No pauses. No symptoms reported. She had a brain MRI 09/02/19 with "no abnormality seen to explain the clinical presentation." Denies history of HA, CP. Reports intermittent numbness in the R hand. Denies any focal weakness.    Limitations Walking    Diagnostic tests See history    Patient Stated Goals Improve balance    Currently in Pain? Yes    Pain Score 8     Pain Location Groin    Pain Orientation Left;Right    Pain Onset More than a month ago    Pain Frequency Constant    Aggravating Factors  walking    Pain Relieving Factors heat    Effect of Pain on Daily Activities poor tolerance  to activity           Therapeutic exercise: Nu-step  x 5 mins  Seated : LAQ x 15 x 2 Marching x 15 x 2  Hip abd/ER with RTB x 15 x 2  Patient performed with instruction, verbal cues, tactile cues of therapist: goal: increase tissue extensibility, promote proper posture, improve mobility                              PT Education - 03/09/20 1437    Education Details HEP    Person(s) Educated Patient    Methods Explanation    Comprehension Verbalized understanding            PT Short Term Goals - 01/18/20 1435      PT SHORT TERM GOAL #1   Title Pt will be independent with HEP in order to  improve strength and balance in order to decrease fall risk and improve function at home.    Time 4    Period Weeks    Status Partially Met    Target Date 12/20/19             PT Long Term Goals - 01/18/20 1435      PT LONG TERM GOAL #1   Title Pt will improve BERG by at least 3 points in order to demonstrate clinically significant improvement in balance.    Baseline 11/22/19: 47/56, 01/18/20= 52/56    Time 8    Period Weeks    Status Partially Met    Target Date 03/13/20      PT LONG TERM GOAL #2   Title Pt will improve ABC by at least 13% in order to demonstrate clinically significant improvement in balance confidence.    Baseline 11/22/19: 35.6%, 01/18/20=    Time 8    Period Weeks    Status Partially Met    Target Date 03/13/20      PT LONG TERM GOAL #3   Title Pt will decrease TUG to below 14 seconds/decrease in order to demonstrate decreased fall risk.    Baseline 11/22/19: 16.1s, 01/18/20=14 sec,10.26 sec    Time 8    Period Weeks    Status Partially Met    Target Date 03/13/20      PT LONG TERM GOAL #4   Title Patient will improve with FOTO score to demonstrate improved functional mobility    Time 8    Period Weeks    Status New    Target Date 03/13/20                 Plan - 03/09/20 1437    Clinical Impression Statement  Pt was able to perform all exercises today with CGA.Marland Kitchen Pt has high pain today and needs to do seated  exercises with low resistance..  Pt requires verbal, visual and tactile cues during exercise in order to complete tasks with proper form and technique. Pt would continue to benefit from skilled PT services in order to further strengthen LE's, improve static and dynamic balance, and improve coordination in order to increase functional mobility and decrease risk of falls   Personal Factors and Comorbidities Age;Comorbidity 3+;Past/Current Experience    Comorbidities HTN, OA, OSA, hypothyroidism    Examination-Activity Limitations  Squat;Stand;Stairs;Transfers    Examination-Participation Restrictions Church;Community Activity;Cleaning    Stability/Clinical Decision Making Evolving/Moderate complexity    Rehab Potential Good    PT Frequency 2x / week  PT Duration 8 weeks    PT Treatment/Interventions ADLs/Self Care Home Management;Aquatic Therapy;Canalith Repostioning;Cryotherapy;Electrical Stimulation;Iontophoresis 25m/ml Dexamethasone;Moist Heat;Traction;Ultrasound;DME Instruction;Gait training;Functional mobility training;Stair training;Therapeutic activities;Therapeutic exercise;Balance training;Neuromuscular re-education;Patient/family education;Manual techniques;Passive range of motion;Dry needling;Vestibular;Spinal Manipulations;Joint Manipulations    PT Next Visit Plan Review HEP, strength and balance exercises    PT Home Exercise Plan Medbridge Access Code: [email or web address](seated clams with tband, seated marching with tband, standing mini squats)    Consulted and Agree with Plan of Care Patient           Patient will benefit from skilled therapeutic intervention in order to improve the following deficits and impairments:  Abnormal gait, Decreased balance, Decreased strength, Pain  Visit Diagnosis: Unsteadiness on feet  Muscle weakness (generalized)  Localized edema     Problem List Patient Active Problem List   Diagnosis Date Noted  . 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 Chronic left-sided low back pain with left-sided sciatica 06/22/2018  . Vitamin B 12 deficiency 01/02/2018  . Chronic anemia 09/06/2017  . Chronic constipation 06/08/2017  . Lumbar radiculopathy 05/22/2017  . Spinal stenosis of lumbosacral region 03/19/2017  . 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 Acute bilateral low back pain without sciatica 02/04/2017  . Dysuria 12/13/2016  . Prediabetes 11/05/2016  . Acquired hypothyroidism 08/24/2016  . Myofascial pain syndrome  08/24/2016  . Essential hypertension 08/24/2016  . Osteopenia of multiple sites 08/24/2016  . Lumbar degenerative disc disease 08/24/2016  . Bilateral hydronephrosis 07/15/2016  . Rotator cuff tendinitis, left 06/21/2016  . 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 Rotator cuff tendinitis, right 11/27/2015  . 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  . Injury of tendon of long head of right biceps 11/27/2015  . Incomplete tear of right rotator cuff 11/27/2015  . Obstructive sleep apnea on CPAP 07/13/2015  . Osteoarthritis 11/04/2013  . Disorder of uterus 04/29/1995    MAlanson Puls PVirginiaDPT 03/09/2020, 2:39 PM  CMount VernonMAIN [facility] HospitalSERVICES [address] [address]Phone: [phone]  Fax:  [phone] Name: MKATHRINE RIEVESMRN: [id number] of Birth: [birth year]

## 2020-03-14 ENCOUNTER — Encounter: Payer: Self-pay | Admitting: Physical Therapy

## 2020-03-14 ENCOUNTER — Other Ambulatory Visit: Payer: Self-pay

## 2020-03-14 ENCOUNTER — Ambulatory Visit: Payer: Medicare Other | Admitting: Physical Therapy

## 2020-03-14 DIAGNOSIS — M6281 Muscle weakness (generalized): Secondary | ICD-10-CM

## 2020-03-14 DIAGNOSIS — R600 Localized edema: Secondary | ICD-10-CM

## 2020-03-14 DIAGNOSIS — R2681 Unsteadiness on feet: Secondary | ICD-10-CM | POA: Diagnosis not present

## 2020-03-14 NOTE — Therapy (Signed)
[place] MAIN [facility] SERVICES [address] [place], [place], [address] Phone: [phone]   Fax:  [phone]  Physical Therapy Treatment / Discharge summary  Patient Details  Name: Erika Crawford MRN: [id number] Date of Birth: Aug 12, 1940 Referring Provider (PT): Dr. Melrose Nakayama   Encounter Date: 03/14/2020   PT End of Session - 03/14/20 1437    Visit Number 18    Number of Visits 25    Date for PT Re-Evaluation 03/13/20    Authorization Type eval: 11/22/19, FOTO completed    PT Start Time 1430    PT Stop Time 1515    PT Time Calculation (min) 45 min    Equipment Utilized During Treatment Gait belt    Activity Tolerance Patient tolerated treatment well;No increased pain    Behavior During Therapy WFL for tasks assessed/performed           Past Medical History:  Diagnosis Date  . Arthritis   . Complication of anesthesia    dizziness  . Hypertension   . Hypothyroidism   . Sleep apnea    cpap  . Thyroid disease     Past Surgical History:  Procedure Laterality Date  . ABDOMINAL HYSTERECTOMY    . BACK SURGERY     lower middle kyphoplasty  . COLONOSCOPY WITH PROPOFOL N/A 09/21/2015   Procedure: COLONOSCOPY WITH PROPOFOL;  Surgeon: Hulen Luster, MD;  Location: [facility] ENDOSCOPY;  Service: Gastroenterology;  Laterality: N/A;  . JOINT REPLACEMENT     03/19/2016- left knee replacement  . REVERSE SHOULDER ARTHROPLASTY Right 08/25/2018   Procedure: REVERSE SHOULDER ARTHROPLASTY, RIGHT;  Surgeon: Corky Mull, MD;  Location: [facility] ORS;  Service: Orthopedics;  Laterality: Right;  . 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   Procedure: TOTAL KNEE ARTHROPLASTY;  Surgeon: Corky Mull, MD;  Location: [facility] ORS;  Service: Orthopedics;  Laterality: Left;    There were no vitals filed for this visit.   Subjective Assessment - 03/14/20 1436    Subjective Patient reports she is having high paint today. Pain is in her groin R 8/10, 7/10 left side.     Pertinent History Pt reports sustaining a fall beginning of April 2021, landed on bilateral knees and hands. Has since had bilateral thigh/hip pain. Prior to event, had difficulty with balance but subsequently worse. History of dizziness resolved stopping gabapentin. Pt reports dizziness is rare, once every 6-8 weeks, feels like she is about to "pass out" and then it goes away. Pt denies LOC. Symptoms come on whilst sitting. No known aggravating factors. "It never occurs because of motion." Denies any true vertigo. She saw cardiology and had a Holter monitored which per cardiology, study offered no remarkable findings. Report mentioned infrequent PVC, several runs of SVT, longest of which was 29 beats. No pauses. No symptoms reported. She had a brain MRI 09/02/19 with "no abnormality seen to explain the clinical presentation." Denies history of HA, CP. Reports intermittent numbness in the R hand. Denies any focal weakness.    Limitations Walking    Diagnostic tests See history    Patient Stated Goals Improve balance    Currently in Pain? Yes    Pain Score 7     Pain Location Leg    Pain Orientation Right;Left    Pain Descriptors / Indicators Aching    Pain Onset More than a month ago              [facility] PT Assessment - 03/14/20 0001  Berg Balance Test   Sit to Stand Able to stand without using hands and stabilize independently    Standing Unsupported Able to stand safely 2 minutes    Sitting with Back Unsupported but Feet Supported on Floor or Stool Able to sit safely and securely 2 minutes    Stand to Sit Sits safely with minimal use of hands    Transfers Able to transfer safely, minor use of hands    Standing Unsupported with Eyes Closed Able to stand 10 seconds safely    Standing Unsupported with Feet Together Able to place feet together independently and stand 1 minute safely    From Standing, Reach Forward with Outstretched Arm Can reach confidently >25 cm (10")    From Standing  Position, Pick up Object from Floor Able to pick up shoe, needs supervision    From Standing Position, Turn to Look Behind Over each Shoulder Looks behind from both sides and weight shifts well    Turn 360 Degrees Able to turn 360 degrees safely in 4 seconds or less    Standing Unsupported, Alternately Place Feet on Step/Stool Able to stand independently and safely and complete 8 steps in 20 seconds    Standing Unsupported, One Foot in Front Able to plae foot ahead of the other independently and hold 30 seconds    Standing on One Leg Able to lift leg independently and hold equal to or more than 3 seconds    Total Score 52            Treatment: Reviewed stretches for hamstring and gastroc/soleus x 30 sec BLE x 3 sets REviewed goals and outcome measures Patient performed with instruction, verbal cues, tactile cues of therapist: goal: increase tissue extensibility, promote proper posture, improve mobility                       PT Education - 03/14/20 1436    Education Details HEP    Person(s) Educated Patient    Methods Explanation    Comprehension Verbalized understanding            PT Short Term Goals - 01/18/20 1435      PT SHORT TERM GOAL #1   Title Pt will be independent with HEP in order to improve strength and balance in order to decrease fall risk and improve function at home.    Time 4    Period Weeks    Status Partially Met    Target Date 12/20/19             PT Long Term Goals - 03/14/20 1438      PT LONG TERM GOAL #1   Title Pt will improve BERG by at least 3 points in order to demonstrate clinically significant improvement in balance.    Baseline 11/22/19: 47/56, 01/18/20= 52/56, 03/14/20=52    Time 8    Period Weeks    Status Partially Met    Target Date 05/09/20      PT LONG TERM GOAL #2   Title Pt will improve ABC by at least 13% in order to demonstrate clinically significant improvement in balance confidence.    Baseline 11/22/19: 35.6%,  03/14/20=28.755    Time 8    Period Weeks    Status Partially Met    Target Date 05/09/20      PT LONG TERM GOAL #3   Title Pt will decrease TUG to below 14 seconds/decrease in order to demonstrate decreased fall risk.  Baseline 6/7= 16 sec, 03/14/20=12.58    Time 8    Period Weeks    Status Partially Met    Target Date 05/09/20      PT LONG TERM GOAL #4   Title Patient will improve with FOTO score to demonstrate improved functional mobility    Baseline 03/14/20=40.27    Time 8    Period Weeks    Status Partially Met    Target Date 05/09/20                 Plan - 03/14/20 1504    Clinical Impression Statement Patient's condition has the potential to improve in response to therapy. Maximum improvement is yet to be obtained. The anticipated improvement is attainable and reasonable in a generally predictable time.  Patient reports that her functional mobility depends on much pain  she is having during the day. Her outcome measures stayed the same and his goals were reviewed.. Patient will be discharged from skilled PT due to lack of progress that is related to overall pain levels.    Personal Factors and Comorbidities Age;Comorbidity 3+;Past/Current Experience    Comorbidities HTN, OA, OSA, hypothyroidism    Examination-Activity Limitations Squat;Stand;Stairs;Transfers    Examination-Participation Restrictions Church;Community Activity;Cleaning    Stability/Clinical Decision Making Evolving/Moderate complexity    Rehab Potential Good    PT Frequency 2x / week    PT Duration 8 weeks    PT Treatment/Interventions ADLs/Self Care Home Management;Aquatic Therapy;Canalith Repostioning;Cryotherapy;Electrical Stimulation;Iontophoresis 30m/ml Dexamethasone;Moist Heat;Traction;Ultrasound;DME Instruction;Gait training;Functional mobility training;Stair training;Therapeutic activities;Therapeutic exercise;Balance training;Neuromuscular re-education;Patient/family education;Manual  techniques;Passive range of motion;Dry needling;Vestibular;Spinal Manipulations;Joint Manipulations    PT Next Visit Plan Review HEP, strength and balance exercises    PT Home Exercise Plan Medbridge Access Code: [email or web address](seated clams with tband, seated marching with tband, standing mini squats)    Consulted and Agree with Plan of Care Patient           Patient will benefit from skilled therapeutic intervention in order to improve the following deficits and impairments:  Abnormal gait, Decreased balance, Decreased strength, Pain  Visit Diagnosis: Unsteadiness on feet  Muscle weakness (generalized)  Localized edema     Problem List Patient Active Problem List   Diagnosis Date Noted  . 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 Chronic left-sided low back pain with left-sided sciatica 06/22/2018  . Vitamin B 12 deficiency 01/02/2018  . Chronic anemia 09/06/2017  . Chronic constipation 06/08/2017  . Lumbar radiculopathy 05/22/2017  . Spinal stenosis of lumbosacral region 03/19/2017  . 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 Acute bilateral low back pain without sciatica 02/04/2017  . Dysuria 12/13/2016  . Prediabetes 11/05/2016  . Acquired hypothyroidism 08/24/2016  . Myofascial pain syndrome 08/24/2016  . Essential hypertension 08/24/2016  . Osteopenia of multiple sites 08/24/2016  . Lumbar degenerative disc disease 08/24/2016  . Bilateral hydronephrosis 07/15/2016  . Rotator cuff tendinitis, left 06/21/2016  . 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 Rotator cuff tendinitis, right 11/27/2015  . 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  . Injury of tendon of long head of right biceps 11/27/2015  . Incomplete tear of right rotator cuff 11/27/2015  . Obstructive sleep apnea on CPAP 07/13/2015  . Osteoarthritis 11/04/2013  . Disorder of uterus 04/29/1995    MAlanson Puls PVirginia DPT 03/14/2020, 3:10 PM  [place] [facility] [address] [address]Phone:  [phone]   Fax:  [phone]  Name: Erika Crawford MRN: [id number] Date of Birth: 17-Jun-1941

## 2020-03-14 NOTE — Patient Instructions (Signed)
Soleus Stretch    Stand with right foot back, both knees bent. Keeping heel on floor, turned slightly out, lean into wall until stretch is felt in lower calf. Hold _30___ seconds. . Do __1__ sessions per day.  http://orth.exer.us/25   Copyright  VHI. All rights reserved.  Gastroc Stretch - Standing    Stand with uninvolved leg straight, heel on floor, toes pointed slightly inward. Lean into wall until stretch is felt in calf. Hold for _30__ seconds. Repeat on involved leg. Repeat __3_ times. Do _1__ times per day.  Copyright  VHI. All rights reserved.  Hamstring Stretch (Standing)    Standing, place one heel on chair or bench. Use one or both hands on thigh for support. Keeping torso straight, lean forward slowly until a stretch is felt in back of same thigh. Hold _30___ seconds. Repeat with other leg.  Copyright  VHI. All rights reserved.

## 2020-03-16 ENCOUNTER — Ambulatory Visit: Payer: Medicare Other | Admitting: Physical Therapy

## 2020-03-20 ENCOUNTER — Ambulatory Visit: Payer: Medicare Other | Admitting: Physical Therapy

## 2020-03-22 ENCOUNTER — Ambulatory Visit: Payer: Medicare Other | Admitting: Physical Therapy

## 2020-03-28 ENCOUNTER — Ambulatory Visit: Payer: Medicare Other

## 2020-03-29 ENCOUNTER — Other Ambulatory Visit: Payer: Self-pay | Admitting: Student

## 2020-03-29 DIAGNOSIS — M84352A Stress fracture, left femur, initial encounter for fracture: Secondary | ICD-10-CM

## 2020-04-05 ENCOUNTER — Ambulatory Visit: Payer: Medicare Other | Admitting: Physical Therapy

## 2020-04-10 ENCOUNTER — Ambulatory Visit: Payer: Medicare Other | Admitting: Physical Therapy

## 2020-04-12 ENCOUNTER — Ambulatory Visit: Payer: Medicare Other | Admitting: Physical Therapy

## 2020-04-13 ENCOUNTER — Other Ambulatory Visit: Payer: Self-pay

## 2020-04-13 ENCOUNTER — Ambulatory Visit
Admission: RE | Admit: 2020-04-13 | Discharge: 2020-04-13 | Disposition: A | Discharge status: Home / Self Care | Payer: Medicare Other | Source: Ambulatory Visit | Attending: Student | Admitting: Student

## 2020-04-13 DIAGNOSIS — M84352A Stress fracture, left femur, initial encounter for fracture: Secondary | ICD-10-CM | POA: Diagnosis present

## 2020-04-20 ENCOUNTER — Other Ambulatory Visit
Admission: RE | Admit: 2020-04-20 | Discharge: 2020-04-20 | Disposition: A | Discharge status: Home / Self Care | Payer: Medicare Other | Source: Ambulatory Visit | Attending: Gastroenterology | Admitting: Gastroenterology

## 2020-04-20 ENCOUNTER — Other Ambulatory Visit: Payer: Self-pay

## 2020-04-20 DIAGNOSIS — Z20822 Contact with and (suspected) exposure to covid-19: Secondary | ICD-10-CM | POA: Insufficient documentation

## 2020-04-20 DIAGNOSIS — Z01818 Encounter for other preprocedural examination: Secondary | ICD-10-CM | POA: Diagnosis present

## 2020-04-20 LAB — SARS CORONAVIRUS 2 (TAT 6-24 HRS): SARS Coronavirus 2: NEGATIVE

## 2020-04-24 ENCOUNTER — Ambulatory Visit: Payer: Medicare Other | Admitting: Certified Registered Nurse Anesthetist

## 2020-04-24 ENCOUNTER — Ambulatory Visit
Admission: RE | Admit: 2020-04-24 | Discharge: 2020-04-24 | Disposition: A | Discharge status: Home / Self Care | Payer: Medicare Other | Attending: Gastroenterology | Admitting: Gastroenterology

## 2020-04-24 ENCOUNTER — Encounter
Admission: RE | Disposition: A | Discharge status: Home / Self Care | Payer: Self-pay | Source: Home / Self Care | Attending: Gastroenterology

## 2020-04-24 ENCOUNTER — Encounter: Payer: Self-pay | Admitting: *Deleted

## 2020-04-24 DIAGNOSIS — Z79899 Other long term (current) drug therapy: Secondary | ICD-10-CM | POA: Insufficient documentation

## 2020-04-24 DIAGNOSIS — Z888 Allergy status to other drugs, medicaments and biological substances status: Secondary | ICD-10-CM | POA: Insufficient documentation

## 2020-04-24 DIAGNOSIS — D120 Benign neoplasm of cecum: Secondary | ICD-10-CM | POA: Diagnosis not present

## 2020-04-24 DIAGNOSIS — K5900 Constipation, unspecified: Secondary | ICD-10-CM | POA: Diagnosis not present

## 2020-04-24 DIAGNOSIS — Z885 Allergy status to narcotic agent status: Secondary | ICD-10-CM | POA: Insufficient documentation

## 2020-04-24 DIAGNOSIS — K640 First degree hemorrhoids: Secondary | ICD-10-CM | POA: Diagnosis not present

## 2020-04-24 DIAGNOSIS — Z791 Long term (current) use of non-steroidal anti-inflammatories (NSAID): Secondary | ICD-10-CM | POA: Diagnosis not present

## 2020-04-24 DIAGNOSIS — Z886 Allergy status to analgesic agent status: Secondary | ICD-10-CM | POA: Diagnosis not present

## 2020-04-24 DIAGNOSIS — Z79890 Hormone replacement therapy (postmenopausal): Secondary | ICD-10-CM | POA: Diagnosis not present

## 2020-04-24 DIAGNOSIS — R933 Abnormal findings on diagnostic imaging of other parts of digestive tract: Secondary | ICD-10-CM | POA: Insufficient documentation

## 2020-04-24 DIAGNOSIS — Z7983 Long term (current) use of bisphosphonates: Secondary | ICD-10-CM | POA: Diagnosis not present

## 2020-04-24 DIAGNOSIS — D123 Benign neoplasm of transverse colon: Secondary | ICD-10-CM | POA: Diagnosis not present

## 2020-04-24 HISTORY — PX: COLONOSCOPY WITH PROPOFOL: SHX5780

## 2020-04-24 SURGERY — COLONOSCOPY WITH PROPOFOL
Anesthesia: General

## 2020-04-24 MED ORDER — PROPOFOL 10 MG/ML IV BOLUS
INTRAVENOUS | Status: DC | PRN
  Administered 2020-04-24: 80 mg via INTRAVENOUS

## 2020-04-24 MED ORDER — PROPOFOL 500 MG/50ML IV EMUL
INTRAVENOUS | Status: AC
  Filled 2020-04-24: qty 50

## 2020-04-24 MED ORDER — SODIUM CHLORIDE 0.9 % IV SOLN
INTRAVENOUS | Status: DC
  Administered 2020-04-24: 1000 mL via INTRAVENOUS

## 2020-04-24 MED ORDER — LIDOCAINE HCL (PF) 1 % IJ SOLN
INTRAMUSCULAR | Status: AC
  Filled 2020-04-24: qty 2

## 2020-04-24 MED ORDER — PROPOFOL 500 MG/50ML IV EMUL
INTRAVENOUS | Status: DC | PRN
  Administered 2020-04-24: 130 ug/kg/min via INTRAVENOUS

## 2020-04-24 MED ORDER — LIDOCAINE HCL (CARDIAC) PF 100 MG/5ML IV SOSY
PREFILLED_SYRINGE | INTRAVENOUS | Status: DC | PRN
  Administered 2020-04-24: 50 mg via INTRAVENOUS

## 2020-04-24 MED ORDER — LIDOCAINE HCL (PF) 2 % IJ SOLN
INTRAMUSCULAR | Status: AC
  Filled 2020-04-24: qty 5

## 2020-04-24 NOTE — Op Note (Signed)
[facility] Gastroenterology Patient Name: Erika Crawford Procedure Date: 04/24/2020 10:08 AM MRN: [id number] Account #: [id number] Date of Birth: 10/09/1940 Admit Type: Outpatient Age: 79 Room: [facility] ENDO ROOM 3 Gender: Female Note Status: Finalized Procedure:             Colonoscopy Indications:           Abnormal MRI of the GI tract Providers:             Andrey Farmer MD, MD Referring MD:          Gladstone Lighter, MD (Referring MD) Medicines:             Monitored Anesthesia Care Complications:         No immediate complications. Estimated blood loss:                         Minimal. Procedure:             Pre-Anesthesia Assessment:                        - Prior to the procedure, a History and Physical was                         performed, and patient medications and allergies were                         reviewed. The patient is competent. The risks and                         benefits of the procedure and the sedation options and                         risks were discussed with the patient. All questions                         were answered and informed consent was obtained.                         Patient identification and proposed procedure were                         verified by the physician, the nurse, the anesthetist                         and the technician in the endoscopy suite. Mental                         Status Examination: alert and oriented. Airway                         Examination: normal oropharyngeal airway and neck                         mobility. Respiratory Examination: clear to                         auscultation. CV Examination: normal. Prophylactic  Antibiotics: The patient does not require prophylactic                         antibiotics. Prior Anticoagulants: The patient has                         taken no previous anticoagulant or antiplatelet                         agents. ASA Grade  Assessment: II - A patient with mild                         systemic disease. After reviewing the risks and                         benefits, the patient was deemed in satisfactory                         condition to undergo the procedure. The anesthesia                         plan was to use monitored anesthesia care (MAC).                         Immediately prior to administration of medications,                         the patient was re-assessed for adequacy to receive                         sedatives. The heart rate, respiratory rate, oxygen                         saturations, blood pressure, adequacy of pulmonary                         ventilation, and response to care were monitored                         throughout the procedure. The physical status of the                         patient was re-assessed after the procedure.                        After obtaining informed consent, the colonoscope was                         passed under direct vision. Throughout the procedure,                         the patient's blood pressure, pulse, and oxygen                         saturations were monitored continuously. The                         Colonoscope was introduced through the anus and  advanced to the the cecum, identified by appendiceal                         orifice and ileocecal valve. The colonoscopy was                         performed without difficulty. The patient tolerated                         the procedure well. The quality of the bowel                         preparation was good. Findings:      The perianal and digital rectal examinations were normal.      Two sessile polyps were found in the cecum. The polyps were 3 to 4 mm in       size. These polyps were removed with a cold snare. Resection and       retrieval were complete. Estimated blood loss was minimal.      Two sessile polyps were found in the hepatic flexure. The polyps were  3       to 8 mm in size. These polyps were removed with a cold snare. Resection       and retrieval were complete. Estimated blood loss was minimal.      Bleeding internal hemorrhoids were found during retroflexion. The       hemorrhoids were Grade I (internal hemorrhoids that do not prolapse).      The exam was otherwise without abnormality on direct and retroflexion       views. Impression:            - Two 3 to 4 mm polyps in the cecum, removed with a                         cold snare. Resected and retrieved.                        - Two 3 to 8 mm polyps at the hepatic flexure, removed                         with a cold snare. Resected and retrieved.                        - Bleeding internal hemorrhoids.                        - The examination was otherwise normal on direct and                         retroflexion views. Recommendation:        - Discharge patient to home.                        - Resume previous diet.                        - Continue present medications.                        - Await pathology results.                        -  Repeat colonoscopy for surveillance based on                         pathology results.                        - Return to referring physician as previously                         scheduled. Procedure Code(s):     --- Professional ---                        [phone], Colonoscopy, flexible; with removal of                         tumor(s), polyp(s), or other lesion(s) by snare                         technique Diagnosis Code(s):     --- Professional ---                        K63.5, Polyp of colon                        K64.0, First degree hemorrhoids                        R93.3, Abnormal findings on diagnostic imaging of                         other parts of digestive tract CPT copyright 2019 American Medical Association. All rights reserved. The codes documented in this report are preliminary and upon coder review may  be revised to  meet current compliance requirements. Andrey Farmer, MD Andrey Farmer MD, MD 04/24/2020 10:43:46 AM Number of Addenda: 0 Note Initiated On: 04/24/2020 10:08 AM Scope Withdrawal Time: 0 hours 8 minutes 57 seconds  Total Procedure Duration: 0 hours 16 minutes 51 seconds  Estimated Blood Loss:  Estimated blood loss was minimal.      [facility]

## 2020-04-24 NOTE — H&P (Signed)
Outpatient short stay form Pre-procedure 04/24/2020 10:13 AM Raylene Miyamoto MD, MPH  Primary Physician: Dr. Tressia Miners  Reason for visit:  Abnormal imaging  History of present illness:   79 y/o lady with history of polyps here for colonoscopy due to MRI showing rectal thickening. She has issues with severe constipation but states her bowel prep went well. No family history of GI malignancies. No blood thinners. States she had hysterectomy years ago.    Current Facility-Administered Medications:  .  0.9 %  sodium chloride infusion, , Intravenous, Continuous, Elazar Argabright, Hilton Cork, MD, Last Rate: 20 mL/hr at 04/24/20 0937, 1,000 mL at 04/24/20 0937 .  lidocaine (PF) (XYLOCAINE) 1 % injection, , , ,   Medications Prior to Admission  Medication Sig Dispense Refill Last Dose  . Acetaminophen (TYLENOL PO) Take 500 mg by mouth 2 (two) times daily.     Marland Kitchen diltiazem (CARDIZEM CD) 120 MG 24 hr capsule Take 120 mg by mouth daily.   04/24/2020 at 0700  . linaclotide (LINZESS) 145 MCG CAPS capsule Take 145 mcg by mouth daily.     Marland Kitchen senna (SENOKOT) 8.6 MG tablet Take 1 tablet by mouth 2 (two) times daily as needed for constipation.     . vitamin B-12 (CYANOCOBALAMIN) 1000 MCG tablet Take 1,000 mcg by mouth daily.     . Acetaminophen 325 MG CAPS Take 1,000 mg by mouth as needed.     Marland Kitchen alendronate (FOSAMAX) 70 MG tablet Take 70 mg by mouth every Wednesday. Take with a full glass of water on an empty stomach.      Marland Kitchen amLODipine (NORVASC) 5 MG tablet Take 5 mg by mouth daily.   04/24/2020 at 0700  . ASPERCREME LIDOCAINE EX Apply 1 application topically daily as needed (pain).     . cholecalciferol (VITAMIN D3) 25 MCG (1000 UT) tablet Take 2,000 Units by mouth daily.     . cyclobenzaprine (FLEXERIL) 10 MG tablet Take 1 tablet by mouth 3 (three) times daily as needed.     . gabapentin (NEURONTIN) 300 MG capsule 300 mg qday, 300 mg qPM, 600 mg qhs (Patient not taking: Reported on 08/19/2018) 60 capsule 2   .  Homeopathic Products (THERAWORX RELIEF EX) Apply 1 application topically daily as needed (pain).     Marland Kitchen HYDROcodone-acetaminophen (NORCO/VICODIN) 5-325 MG tablet Take 1-2 tablets by mouth every 4 (four) hours as needed for moderate pain (pain score 4-6). (Patient taking differently: Take 1 tablet by mouth every 8 (eight) hours as needed for moderate pain (pain score 4-6). ) 30 tablet 0   . levothyroxine (SYNTHROID, LEVOTHROID) 88 MCG tablet Take 88 mcg by mouth daily before breakfast.    04/24/2020 at 0700  . Magnesium 250 MG TABS Take 250 mg by mouth 2 (two) times daily.     . magnesium oxide (MAG-OX) 400 MG tablet Take 250 mg by mouth daily.     . meloxicam (MOBIC) 15 MG tablet Take 15 mg by mouth.     . Menthol, Topical Analgesic, (BIOFREEZE EX) Apply 1 application topically daily as needed (pain).     . Menthol, Topical Analgesic, (TWO [place]LD GOATS ARTHRITIS EX) Apply 1 application topically daily as needed (pain).     . mometasone (NASONEX) 50 MCG/ACT nasal spray Place 2 sprays into the nose daily as needed (allergies).      . Naphazoline-Pheniramine (OPCON-A OP) Place 1 drop into both eyes daily as needed (allergies).     . olmesartan (BENICAR) 40 MG tablet Take  40 mg by mouth daily.     Marland Kitchen omeprazole (PRILOSEC) 20 MG capsule Take 20 mg by mouth daily.     . ondansetron (ZOFRAN-ODT) 4 MG disintegrating tablet Take 1 tablet (4 mg total) by mouth every 8 (eight) hours as needed. 20 tablet 0   . predniSONE (STERAPRED UNI-PAK 21 TAB) 10 MG (21) TBPK tablet Take 6 pills on day one then decrease by 1 pill each day (Patient not taking: Reported on 06/28/2019) 21 tablet 0   . traZODone (DESYREL) 50 MG tablet Take 50 mg by mouth at bedtime.        Allergies  Allergen Reactions  . Zanaflex [Tizanidine]   . Celebrex [Celecoxib] Rash  . Codeine Nausea And Vomiting  . Etodolac Nausea And Vomiting  . Hydrochlorothiazide     Unknown reaction  . Oxycodone Nausea And Vomiting  . Pravastatin     Unknown  reaction  . Relafen [Nabumetone]     Unknown reaction   . Tramadol Nausea And Vomiting     Past Medical History:  Diagnosis Date  . Arthritis   . Complication of anesthesia    dizziness  . Hypertension   . Hypothyroidism   . Sleep apnea    cpap  . Thyroid disease     Review of systems:  Otherwise negative.    Physical Exam  Gen: Alert, oriented. Appears stated age.  HEENT:  PERRLA. Lungs: No respiratory distress CV: RRR Abd: soft, benign, no masses. Ext: No edema.    Planned procedures: Proceed with colonoscopy. The patient understands the nature of the planned procedure, indications, risks, alternatives and potential complications including but not limited to bleeding, infection, perforation, damage to internal organs and possible oversedation/side effects from anesthesia. The patient agrees and gives consent to proceed.  Please refer to procedure notes for findings, recommendations and patient disposition/instructions.     Raylene Miyamoto MD, MPH Gastroenterology 04/24/2020  10:13 AM

## 2020-04-24 NOTE — Anesthesia Preprocedure Evaluation (Signed)
Anesthesia Evaluation  Patient identified by MRN, date of birth, ID band Patient awake    Reviewed: Allergy & Precautions, NPO status , Patient's Chart, lab work & pertinent test results  History of Anesthesia Complications Negative for: history of anesthetic complications  Airway Mallampati: II  TM Distance: >3 FB Neck ROM: Full    Dental  (+) Partial Upper, Dental Advidsory Given   Pulmonary neg shortness of breath, sleep apnea and Continuous Positive Airway Pressure Ventilation , neg COPD, neg recent URI, former smoker,    breath sounds clear to auscultation- rhonchi (-) wheezing      Cardiovascular hypertension, Pt. on medications (-) angina(-) CAD, (-) Past MI, (-) Cardiac Stents and (-) CABG  Rhythm:Regular Rate:Normal - Systolic murmurs and - Diastolic murmurs    Neuro/Psych neg Seizures negative psych ROS   GI/Hepatic negative GI ROS, Neg liver ROS,   Endo/Other  neg diabetesHypothyroidism   Renal/GU Renal disease     Musculoskeletal  (+) Arthritis ,   Abdominal (+) + obese,   Peds  Hematology  (+) Blood dyscrasia, anemia ,   Anesthesia Other Findings Past Medical History: No date: Arthritis No date: Complication of anesthesia     Comment:  dizziness No date: Hypertension No date: Hypothyroidism No date: Sleep apnea     Comment:  cpap No date: Thyroid disease   Reproductive/Obstetrics                             Anesthesia Physical  Anesthesia Plan  ASA: II  Anesthesia Plan: General   Post-op Pain Management:  Regional for Post-op pain   Induction: Intravenous  PONV Risk Score and Plan: 3 and Propofol infusion and TIVA  Airway Management Planned: Natural Airway and Nasal Cannula  Additional Equipment:   Intra-op Plan:   Post-operative Plan:   Informed Consent: I have reviewed the patients History and Physical, chart, labs and discussed the procedure including  the risks, benefits and alternatives for the proposed anesthesia with the patient or authorized representative who has indicated his/her understanding and acceptance.     Dental advisory given  Plan Discussed with: CRNA and Anesthesiologist  Anesthesia Plan Comments:         Anesthesia Quick Evaluation

## 2020-04-24 NOTE — Transfer of Care (Signed)
Immediate Anesthesia Transfer of Care Note  Patient: Erika Crawford  Procedure(s) Performed: COLONOSCOPY WITH PROPOFOL (N/A )  Patient Location: PACU and Endoscopy Unit  Anesthesia Type:General  Level of Consciousness: drowsy  Airway & Oxygen Therapy: Patient Spontanous Breathing  Post-op Assessment: Report given to RN and Post -op Vital signs reviewed and stable  Post vital signs: Reviewed and stable  Last Vitals:  Vitals Value Taken Time  BP 120/76 04/24/20 1044  Temp    Pulse 89 04/24/20 1044  Resp 22 04/24/20 1044  SpO2 100 % 04/24/20 1044  Vitals shown include unvalidated device data.  Last Pain:  Vitals:   04/24/20 1044  TempSrc:   PainSc: 0-No pain         Complications: No complications documented.

## 2020-04-24 NOTE — Interval H&P Note (Signed)
History and Physical Interval Note:  04/24/2020 10:15 AM  Erika Crawford  has presented today for surgery, with the diagnosis of CONSTPATION ABN MRI PELVIS.  The various methods of treatment have been discussed with the patient and family. After consideration of risks, benefits and other options for treatment, the patient has consented to  Procedure(s): COLONOSCOPY WITH PROPOFOL (N/A) as a surgical intervention.  The patient's history has been reviewed, patient examined, no change in status, stable for surgery.  I have reviewed the patient's chart and labs.  Questions were answered to the patient's satisfaction.     Lesly Rubenstein  Ok to proceed with colonoscopy

## 2020-04-24 NOTE — Anesthesia Postprocedure Evaluation (Signed)
Anesthesia Post Note  Patient: Erika Crawford  Procedure(s) Performed: COLONOSCOPY WITH PROPOFOL (N/A )  Patient location during evaluation: Endoscopy Anesthesia Type: General Level of consciousness: awake and alert Pain management: pain level controlled Vital Signs Assessment: post-procedure vital signs reviewed and stable Respiratory status: spontaneous breathing, nonlabored ventilation, respiratory function stable and patient connected to nasal cannula oxygen Cardiovascular status: blood pressure returned to baseline and stable Postop Assessment: no apparent nausea or vomiting Anesthetic complications: no   No complications documented.   Last Vitals:  Vitals:   04/24/20 1055 04/24/20 1110  BP: (!) 142/70 138/69  Pulse: 83 72  Resp: 15 18  Temp:    SpO2: (!) 89% 99%    Last Pain:  Vitals:   04/24/20 1110  TempSrc:   PainSc: 0-No pain                 Martha Clan

## 2020-04-25 LAB — SURGICAL PATHOLOGY

## 2020-04-26 ENCOUNTER — Encounter: Payer: Self-pay | Admitting: Gastroenterology

## 2020-08-21 ENCOUNTER — Other Ambulatory Visit: Payer: Self-pay | Admitting: Internal Medicine

## 2020-08-21 DIAGNOSIS — Z1231 Encounter for screening mammogram for malignant neoplasm of breast: Secondary | ICD-10-CM

## 2020-09-28 ENCOUNTER — Other Ambulatory Visit: Payer: Self-pay

## 2020-09-28 ENCOUNTER — Ambulatory Visit
Admission: RE | Admit: 2020-09-28 | Discharge: 2020-09-28 | Disposition: A | Discharge status: Home / Self Care | Payer: Medicare Other | Source: Ambulatory Visit | Attending: Internal Medicine | Admitting: Internal Medicine

## 2020-09-28 DIAGNOSIS — Z1231 Encounter for screening mammogram for malignant neoplasm of breast: Secondary | ICD-10-CM | POA: Insufficient documentation

## 2020-11-21 ENCOUNTER — Ambulatory Visit: Payer: Medicare Other

## 2020-12-15 ENCOUNTER — Telehealth: Payer: Self-pay | Admitting: Student in an Organized Health Care Education/Training Program

## 2020-12-15 NOTE — Telephone Encounter (Signed)
Patient lvmail asking for appt. Last visit was January of 2021  I tried to return call, no answer, lvmail asking patient to call back on Tues.

## 2021-01-18 ENCOUNTER — Telehealth: Payer: Self-pay | Admitting: *Deleted

## 2021-01-18 ENCOUNTER — Other Ambulatory Visit: Payer: Self-pay

## 2021-01-18 ENCOUNTER — Ambulatory Visit
Payer: Medicare Other | Attending: Student in an Organized Health Care Education/Training Program | Admitting: Student in an Organized Health Care Education/Training Program

## 2021-01-18 DIAGNOSIS — M5416 Radiculopathy, lumbar region: Secondary | ICD-10-CM

## 2021-01-18 NOTE — Progress Notes (Signed)
I attempted to call the patient however no response. Voicemail left instructing patient to call front desk office at [phone] to reschedule appointment. -Dr Amiliana Foutz  

## 2021-01-18 NOTE — Telephone Encounter (Signed)
Attempted to call for pre appointment review of allergies/meds. Message left. 

## 2021-01-23 ENCOUNTER — Encounter: Payer: Self-pay | Admitting: Student in an Organized Health Care Education/Training Program

## 2021-01-23 ENCOUNTER — Ambulatory Visit
Payer: Medicare Other | Attending: Student in an Organized Health Care Education/Training Program | Admitting: Student in an Organized Health Care Education/Training Program

## 2021-01-23 ENCOUNTER — Other Ambulatory Visit: Payer: Self-pay

## 2021-01-23 VITALS — BP 136/70 | HR 84 | Temp 97.4°F | Resp 18 | Ht 67.0 in | Wt 205.0 lb

## 2021-01-23 DIAGNOSIS — M48061 Spinal stenosis, lumbar region without neurogenic claudication: Secondary | ICD-10-CM | POA: Insufficient documentation

## 2021-01-23 DIAGNOSIS — G894 Chronic pain syndrome: Secondary | ICD-10-CM | POA: Insufficient documentation

## 2021-01-23 DIAGNOSIS — M5416 Radiculopathy, lumbar region: Secondary | ICD-10-CM | POA: Insufficient documentation

## 2021-01-23 DIAGNOSIS — M5136 Other intervertebral disc degeneration, lumbar region: Secondary | ICD-10-CM | POA: Diagnosis present

## 2021-01-23 NOTE — Progress Notes (Signed)
PROVIDER NOTE: Information contained herein reflects review and annotations entered in association with encounter. Interpretation of such information and data should be left to medically-trained personnel. Information provided to patient can be located elsewhere in the medical record under "Patient Instructions". Document created using STT-dictation technology, any transcriptional errors that may result from process are unintentional.    Patient: Erika Crawford  Service Category: E/M  Provider: [name], MD  DOB: May 23, 1941  DOS: 01/23/2021  Specialty: Interventional Pain Management  MRN: [id number]  Setting: Ambulatory outpatient  PCP: Gladstone Lighter, MD  Type: Established Patient    Referring Provider: Gladstone Lighter, MD  Location: Office  Delivery: Face-to-face     HPI  [name], a 80 y.o. year old female, is here today because of her Lumbar radiculopathy [M54.16]. [name] primary complain today is Back Pain (low) Last encounter: My last encounter with her was on 06/29/19 Pertinent problems: [name] has Lumbar radiculopathy; Spinal stenosis of lumbosacral region; Lumbar degenerative disc disease; and Chronic left-sided low back pain with left-sided sciatica on their pertinent problem list. Pain Assessment: Severity of Chronic pain is reported as a 7 /10. Location: Back Lower/denies. Onset: More than a month ago. Quality: Aching. Timing: Intermittent. Modifying factor(s): rest. Vitals:  height is $RemoveB'5\' 7"'ktEdDFcb$  (1.702 m) and weight is 205 lb (93 kg). Her temperature is 97.4 F (36.3 C) (abnormal). Her blood pressure is 136/70 and her pulse is 84. Her respiration is 18 and oxygen saturation is 100%.   Reason for encounter: worsening of previously known (established) problem    [name] presents today with worsening low back pain that radiates into her left hip and left posterior lateral thigh in a dermatomal distribution secondary to lumbar radicular pain.  Her previous  lumbar epidural steroid injection was performed 05/19/2019 which was a left L5-S1.  This injection helped decrease her left lumbar radicular pain by approximately 65% and also improved her ability to ambulate.  Given increased pain that has not been responsive to medications and stretches, recommend repeat lumbar epidural steroid injection.  Risks and benefits reviewed and patient would like to proceed.  ROS  Constitutional: Denies any fever or chills Gastrointestinal: No reported hemesis, hematochezia, vomiting, or acute GI distress Musculoskeletal:  low back and increased left hip and leg pain Neurological: No reported episodes of acute onset apraxia, aphasia, dysarthria, agnosia, amnesia, paralysis, loss of coordination, or loss of consciousness  Medication Review  Acetaminophen, HYDROcodone-acetaminophen, Homeopathic Products, Lidocaine, Magnesium, Menthol (Topical Analgesic), Naphazoline-Pheniramine, alendronate, amLODipine, cholecalciferol, cyanocobalamin, cyclobenzaprine, diltiazem, fluticasone, gabapentin, levothyroxine, linaclotide, magnesium gluconate, magnesium oxide, meloxicam, mometasone, olmesartan, omeprazole, ondansetron, predniSONE, senna, senna-docusate, traZODone, and vitamin B-12  History Review  Allergy: [name] is allergic to zanaflex [tizanidine], celebrex [celecoxib], codeine, etodolac, hydrochlorothiazide, oxycodone, pravastatin, relafen [nabumetone], and tramadol. Drug: [name]  reports no history of drug use. Alcohol:  reports current alcohol use. Tobacco:  reports that she has quit smoking. She has never used smokeless tobacco. Social: [name]  reports that she has quit smoking. She has never used smokeless tobacco. She reports current alcohol use. She reports that she does not use drugs. Medical:  has a past medical history of Arthritis, Complication of anesthesia, Hypertension, Hypothyroidism, Sleep apnea, and Thyroid disease. Surgical: 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 Family: family history is  not on file.  Laboratory Chemistry Profile   Renal Lab Results  Component Value Date   BUN 15 09/17/2019   CREATININE 0.67 09/17/2019   GFRAA >60 09/17/2019   GFRNONAA >60 09/17/2019    Hepatic Lab Results  Component Value Date   AST 16 01/19/2014   ALT 25 01/19/2014   ALBUMIN 3.6 01/19/2014   ALKPHOS 62 01/19/2014    Electrolytes Lab Results  Component Value Date   NA 141 09/17/2019   K 3.5 09/17/2019   CL 107 09/17/2019   CALCIUM 8.8 (L) 09/17/2019    Bone No results found for: VD25OH, VD125OH2TOT, RW4315QM0, QQ7619JK9, 25OHVITD1, 25OHVITD2, 25OHVITD3, TESTOFREE, TESTOSTERONE  Inflammation (CRP: Acute Phase) (ESR: Chronic Phase) No results found for: CRP, ESRSEDRATE, LATICACIDVEN       Note: Above Lab results reviewed.  Recent Imaging Review  MM 3D SCREEN BREAST BILATERAL CLINICAL DATA:  Screening.  EXAM: DIGITAL SCREENING BILATERAL MAMMOGRAM WITH TOMOSYNTHESIS AND CAD  TECHNIQUE: Bilateral screening digital craniocaudal and mediolateral oblique mammograms were obtained. Bilateral screening digital breast tomosynthesis was performed. The images were evaluated with computer-aided detection.  COMPARISON:  Previous exam(s).  ACR Breast Density Category b: There are scattered areas of fibroglandular density.  FINDINGS: There are no findings suspicious for malignancy. The images were evaluated with computer-aided detection.  IMPRESSION: No mammographic evidence of malignancy. A result letter of this screening mammogram will be mailed directly to the patient.  RECOMMENDATION: Screening mammogram in one year. (Code:SM-B-01Y)  BI-RADS CATEGORY  1:  Negative.  Electronically Signed   By: Abelardo Diesel M.D.   On: 09/29/2020 16:03 Note: Reviewed        Physical Exam  General appearance: Well nourished, well developed, and well hydrated. In no apparent acute distress Mental status: Alert, oriented x 3 (person, place, & time)       Respiratory: No evidence of acute respiratory distress Eyes: PERLA Vitals: BP 136/70 (BP Location: Left Arm, Patient Position: Sitting, Cuff Size: Normal)   Pulse 84   Temp (!) 97.4 F (36.3 C)   Resp 18   Ht $R'5\' 7"'mF$  (1.702 m)   Wt 205 lb (93 kg)   SpO2 100%   BMI 32.11 kg/m  BMI: Estimated body mass index is 32.11 kg/m as calculated from the following:   Height as of this encounter: $RemoveBeforeD'5\' 7"'uzleYOxOMHYDKe$  (1.702 m).   Weight as of this encounter: 205 lb (93 kg). Ideal: Ideal body weight: 61.6 kg (135 lb 12.9 oz) Adjusted ideal body weight: 74.2 kg (163 lb 7.7 oz)  Lumbar Spine Area Exam  Skin & Axial Inspection: No masses, redness, or swelling Alignment: Symmetrical Functional ROM: Decreased ROM       Stability: No instability detected Muscle Tone/Strength: Functionally intact. No obvious neuro-muscular anomalies detected. Sensory (Neurological): Dermatomal pain pattern Palpation: No palpable anomalies       Provocative Tests: Hyperextension/rotation test: deferred today       Lumbar quadrant test (Kemp's test): (+) on the left for foraminal stenosis  Gait & Posture Assessment  Ambulation: Unassisted Gait: Antalgic gait (limping) Posture: Difficulty standing up straight, due to pain  Lower Extremity Exam    Side: Right lower extremity  Side: Left lower extremity  Stability: No instability observed          Stability: No instability observed          Skin & Extremity Inspection: Skin color, temperature, and hair growth are WNL. No peripheral edema or cyanosis. No masses, redness, swelling, asymmetry, or associated  skin lesions. No contractures.  Skin & Extremity Inspection: Skin color, temperature, and hair  growth are WNL. No peripheral edema or cyanosis. No masses, redness, swelling, asymmetry, or associated skin lesions. No contractures.  Functional ROM: Unrestricted ROM                  Functional ROM: Decreased ROM                  Muscle Tone/Strength: Functionally intact. No obvious neuro-muscular anomalies detected.  Muscle Tone/Strength: Functionally intact. No obvious neuro-muscular anomalies detected.  Sensory (Neurological): Unimpaired        Sensory (Neurological): Dermatomal pain pattern        DTR: Patellar: deferred today Achilles: deferred today Plantar: deferred today  DTR: Patellar: deferred today Achilles: deferred today Plantar: deferred today  Palpation: No palpable anomalies  Palpation: No palpable anomalies    Assessment   Status Diagnosis  Having a Flare-up Having a Flare-up Controlled 1. Lumbar radiculopathy   2. Lumbar foraminal stenosis   3. Lumbar degenerative disc disease   4. Chronic pain syndrome      Updated Problems: Problem  Chronic Left-Sided Low Back Pain With Left-Sided Sciatica  Lumbar Radiculopathy  Spinal Stenosis of Lumbosacral Region  Lumbar Degenerative Disc Disease    Plan of Care    [name] has a current medication list which includes the following long-term medication(s): diltiazem, gabapentin, levothyroxine, linaclotide, mometasone, olmesartan, omeprazole, diltiazem, gabapentin, levothyroxine, olmesartan, and trazodone.  Lumbar radicular pain flare that has been responsive to previous lumbar epidural steroid injection.  Previous 1 performed 05/19/2019 that provided low back and leg pain relief.  Plan to repeat  Orders:  Orders Placed This Encounter  Procedures   Lumbar Epidural Injection    Standing Status:   Future    Standing Expiration Date:   02/23/2021    Scheduling Instructions:     Procedure: Interlaminar Lumbar Epidural Steroid injection (LESI)            Left L5/ S1    Order Specific Question:   Where  will this procedure be performed?    Answer:   [facility]   Follow-up plan:   Return in about 1 week (around 01/30/2021) for Left L5/S1 ESI , without sedation.     Previous lumbar epidurals have been very helpful in managing her lumbar radicular pain every 3 to every 4 months previously done on 01/04/2019.  status post SI joint injection 04/05/2019-not effective, repeat ESI prn.       Recent Visits Date Type Provider Dept  01/18/21 Telemedicine [name], MD [facility]  Showing recent visits within past 90 days and meeting all other requirements Today's Visits Date Type Provider Dept  01/23/21 Office Visit [name], MD [facility]  Showing today's visits and meeting all other requirements Future Appointments No visits were found meeting these conditions. Showing future appointments within next 90 days and meeting all other requirements I discussed the assessment and treatment plan with the patient. The patient was provided an opportunity to ask questions and all were answered. The patient agreed with the plan and demonstrated an understanding of the instructions.  Patient advised to call back or seek an in-person evaluation if the symptoms or condition worsens.  Duration of encounter: 30 minutes.  Note by: [name], MD Date: 01/23/2021; Time: 11:22 AM

## 2021-02-07 ENCOUNTER — Other Ambulatory Visit: Payer: Self-pay

## 2021-02-07 ENCOUNTER — Ambulatory Visit (HOSPITAL_BASED_OUTPATIENT_CLINIC_OR_DEPARTMENT_OTHER): Payer: Medicare Other | Admitting: Student in an Organized Health Care Education/Training Program

## 2021-02-07 ENCOUNTER — Ambulatory Visit
Admission: RE | Admit: 2021-02-07 | Discharge: 2021-02-07 | Disposition: A | Discharge status: Home / Self Care | Payer: Medicare Other | Source: Ambulatory Visit | Attending: Student in an Organized Health Care Education/Training Program | Admitting: Student in an Organized Health Care Education/Training Program

## 2021-02-07 ENCOUNTER — Encounter: Payer: Self-pay | Admitting: Student in an Organized Health Care Education/Training Program

## 2021-02-07 DIAGNOSIS — G894 Chronic pain syndrome: Secondary | ICD-10-CM | POA: Insufficient documentation

## 2021-02-07 DIAGNOSIS — M48061 Spinal stenosis, lumbar region without neurogenic claudication: Secondary | ICD-10-CM

## 2021-02-07 DIAGNOSIS — M5416 Radiculopathy, lumbar region: Secondary | ICD-10-CM

## 2021-02-07 MED ORDER — SODIUM CHLORIDE (PF) 0.9 % IJ SOLN
INTRAMUSCULAR | Status: AC
  Filled 2021-02-07: qty 10

## 2021-02-07 MED ORDER — DEXAMETHASONE SODIUM PHOSPHATE 10 MG/ML IJ SOLN
10.0000 mg | Freq: Once | INTRAMUSCULAR | Status: AC
  Administered 2021-02-07: 10 mg

## 2021-02-07 MED ORDER — SODIUM CHLORIDE 0.9% FLUSH
2.0000 mL | Freq: Once | INTRAVENOUS | Status: AC
  Administered 2021-02-07: 2 mL

## 2021-02-07 MED ORDER — LIDOCAINE HCL 2 % IJ SOLN
20.0000 mL | Freq: Once | INTRAMUSCULAR | Status: AC
  Administered 2021-02-07: 400 mg

## 2021-02-07 MED ORDER — ROPIVACAINE HCL 2 MG/ML IJ SOLN
2.0000 mL | Freq: Once | INTRAMUSCULAR | Status: AC
  Administered 2021-02-07: 2 mL via EPIDURAL

## 2021-02-07 MED ORDER — IOHEXOL 180 MG/ML  SOLN
INTRAMUSCULAR | Status: AC
  Filled 2021-02-07: qty 20

## 2021-02-07 MED ORDER — DEXAMETHASONE SODIUM PHOSPHATE 10 MG/ML IJ SOLN
INTRAMUSCULAR | Status: AC
  Filled 2021-02-07: qty 1

## 2021-02-07 MED ORDER — IOHEXOL 180 MG/ML  SOLN
10.0000 mL | Freq: Once | INTRAMUSCULAR | Status: AC
  Administered 2021-02-07: 10 mL via EPIDURAL

## 2021-02-07 MED ORDER — ROPIVACAINE HCL 2 MG/ML IJ SOLN
INTRAMUSCULAR | Status: AC
  Filled 2021-02-07: qty 20

## 2021-02-07 MED ORDER — LIDOCAINE HCL (PF) 2 % IJ SOLN
INTRAMUSCULAR | Status: AC
  Filled 2021-02-07: qty 20

## 2021-02-07 NOTE — Progress Notes (Signed)
Patient's Name: Erika Crawford  MRN: [id number]  Referring Provider: [name], MD  DOB: 1940-11-07  PCP: Gladstone Lighter, MD  DOS: 02/07/2021  Note by: [name], MD  Service setting: Ambulatory outpatient  Specialty: Interventional Pain Management  Patient type: Established  Location: [facility] (AMB) Pain Management Facility  Visit type: Interventional Procedure   Primary Reason for Visit: Interventional Pain Management Treatment. CC: Back Pain (low) L>R Procedure:       Anesthesia, Analgesia, Anxiolysis:  Type: Therapeutic Inter-Laminar Epidural Steroid Injection (previously done 05/19/2019) Region: Lumbar Level: L5-S1 Level. Laterality: Left-Sided         Type: Local Anesthesia Indication(s): Analgesia         Route: Infiltration ([place]/IM) IV Access: Declined Sedation: Declined  Local Anesthetic: Lidocaine 1-2%   Indications: 1. Lumbar radiculopathy   2. Lumbar foraminal stenosis   3. Chronic pain syndrome    Pain Score: Pre-procedure: 3/10 Post-procedure: 0-No pain/10  Pre-op Assessment:  Erika Crawford is a 80 y.o. (year old), female patient, seen today for interventional treatment. 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 Erika Crawford has a current medication list which includes the following prescription(s): acetaminophen, amlodipine, cholecalciferol, cyanocobalamin, cyclobenzaprine, diltiazem, fluticasone, gabapentin, hydrocodone-acetaminophen, levothyroxine, linaclotide, menthol (topical analgesic), mometasone, olmesartan, omeprazole, senna, vitamin b-12, acetaminophen, alendronate, lidocaine, diltiazem, gabapentin, homeopathic products, levothyroxine, magnesium, magnesium gluconate, magnesium oxide, meloxicam, menthol (topical  analgesic), naphazoline-pheniramine, olmesartan, ondansetron, prednisone, senna-docusate, and trazodone. Her primarily concern today is the Back Pain (low)  Initial Vital Signs:  Pulse Rate: 91 Temp: (!) 97.2 F (36.2 C) Resp: 16 BP: 129/61 SpO2: 100 %  BMI: Estimated body mass index is 32.42 kg/m as calculated from the following:   Height as of this encounter: '5\' 7"'$  (1.702 m).   Weight as of this encounter: 207 lb (93.9 kg).  Risk Assessment: Allergies: Reviewed. She is allergic to zanaflex [tizanidine], celebrex [celecoxib], codeine, etodolac, hydrochlorothiazide, oxycodone, pravastatin, relafen [nabumetone], and tramadol.  Allergy Precautions: None required Coagulopathies: Reviewed. None identified.  Blood-thinner therapy: None at this time Active Infection(s): Reviewed. None identified. Erika Crawford is afebrile  Site Confirmation: Erika Crawford was asked to confirm the procedure and laterality before marking the site Procedure checklist: Completed Consent: Before the procedure and under the influence of no sedative(s), amnesic(s), or anxiolytics, the patient was informed of the treatment options, risks and possible complications. To fulfill our ethical and legal obligations, as recommended by the American Medical Association's Code of Ethics, I have informed the patient of my clinical impression; the nature and purpose of the treatment or procedure; the risks, benefits, and possible complications of the intervention; the alternatives, including doing nothing; the risk(s) and benefit(s) of the alternative treatment(s) or procedure(s); and the risk(s) and benefit(s) of doing nothing. The patient was provided information about the general risks and possible complications associated with the procedure. These may include, but are not limited to: failure to achieve desired goals, infection, bleeding, organ or nerve damage, allergic reactions, paralysis, and death. In addition, the patient was  informed of those risks and complications associated to Spine-related procedures, such as failure to decrease pain; infection (i.e.: Meningitis, epidural or intraspinal abscess); bleeding (i.e.: epidural hematoma, subarachnoid hemorrhage, or any other type of intraspinal or peri-dural bleeding); organ or nerve damage (i.e.: Any type of peripheral nerve, nerve root, or spinal cord injury) with  subsequent damage to sensory, motor, and/or autonomic systems, resulting in permanent pain, numbness, and/or weakness of one or several areas of the body; allergic reactions; (i.e.: anaphylactic reaction); and/or death. Furthermore, the patient was informed of those risks and complications associated with the medications. These include, but are not limited to: allergic reactions (i.e.: anaphylactic or anaphylactoid reaction(s)); adrenal axis suppression; blood sugar elevation that in diabetics may result in ketoacidosis or comma; water retention that in patients with history of congestive heart failure may result in shortness of breath, pulmonary edema, and decompensation with resultant heart failure; weight gain; swelling or edema; medication-induced neural toxicity; particulate matter embolism and blood vessel occlusion with resultant organ, and/or nervous system infarction; and/or aseptic necrosis of one or more joints. Finally, the patient was informed that Medicine is not an exact science; therefore, there is also the possibility of unforeseen or unpredictable risks and/or possible complications that may result in a catastrophic outcome. The patient indicated having understood very clearly. We have given the patient no guarantees and we have made no promises. Enough time was given to the patient to ask questions, all of which were answered to the patient's satisfaction. Erika Crawford has indicated that she wanted to continue with the procedure. Attestation: I, the ordering provider, attest that I have discussed with the  patient the benefits, risks, side-effects, alternatives, likelihood of achieving goals, and potential problems during recovery for the procedure that I have provided informed consent. Date  Time:   Pre-Procedure Preparation:  Monitoring: As per clinic protocol. Respiration, ETCO2, SpO2, BP, heart rate and rhythm monitor placed and checked for adequate function Safety Precautions: Patient was assessed for positional comfort and pressure points before starting the procedure. Time-out: I initiated and conducted the "Time-out" before starting the procedure, as per protocol. The patient was asked to participate by confirming the accuracy of the "Time Out" information. Verification of the correct person, site, and procedure were performed and confirmed by me, the nursing staff, and the patient. "Time-out" conducted as per Joint Commission's Universal Protocol (UP.01.01.01). Time: 1242  Description of Procedure:       Position: Prone with head of the table was raised to facilitate breathing. Target Area: The interlaminar space, initially targeting the lower laminar border of the superior vertebral body. Approach: Paramedial approach. Area Prepped: Entire Posterior Lumbar Region Prepping solution: ChloraPrep (2% chlorhexidine gluconate and 70% isopropyl alcohol) Safety Precautions: Aspiration looking for blood return was conducted prior to all injections. At no point did we inject any substances, as a needle was being advanced. No attempts were made at seeking any paresthesias. Safe injection practices and needle disposal techniques used. Medications properly checked for expiration dates. SDV (single dose vial) medications used. Description of the Procedure: Protocol guidelines were followed. The procedure needle was introduced through the skin, ipsilateral to the reported pain, and advanced to the target area. Bone was contacted and the needle walked caudad, until the lamina was cleared. The epidural space  was identified using "loss-of-resistance technique" with 2-3 ml of PF-NaCl (0.9% NSS), in a 5cc LOR glass syringe. Vitals:   02/07/21 1228 02/07/21 1240 02/07/21 1250  BP: 129/61 (!) 160/70 (!) 162/71  Pulse: 91 88 87  Resp:  16 18  Temp: (!) 97.2 F (36.2 C)    SpO2: 100% 99% 100%  Weight: 207 lb (93.9 kg)    Height: '5\' 7"'$  (1.702 m)      Start Time: 1242 hrs. End Time: 1247 hrs. Materials:  Needle(s) Type: Epidural needle Gauge:  17G Length: 5-in Medication(s): Please see orders for medications and dosing details. 6CC solution made of 3 cc of preservative-free saline, 2 cc of 0.2% ropivacaine, 1 cc of Decadron 10 mg per cc Imaging Guidance (Spinal):  Type of Imaging Technique: Fluoroscopy Guidance (Spinal) Indication(s): Assistance in needle guidance and placement for procedures requiring needle placement in or near specific anatomical locations not easily accessible without such assistance. Exposure Time: Please see nurses notes. Contrast: Before injecting any contrast, we confirmed that the patient did not have an allergy to iodine, shellfish, or radiological contrast. Once satisfactory needle placement was completed at the desired level, radiological contrast was injected. Contrast injected under live fluoroscopy. No contrast complications. See chart for type and volume of contrast used. Fluoroscopic Guidance: I was personally present during the use of fluoroscopy. "Tunnel Vision Technique" used to obtain the best possible view of the target area. Parallax error corrected before commencing the procedure. "Direction-depth-direction" technique used to introduce the needle under continuous pulsed fluoroscopy. Once target was reached, antero-posterior, oblique, and lateral fluoroscopic projection used confirm needle placement in all planes. Images permanently stored in EMR. Interpretation: I personally interpreted the imaging intraoperatively. Adequate needle placement confirmed in multiple  planes. Appropriate spread of contrast into desired area was observed. No evidence of afferent or efferent intravascular uptake. No intrathecal or subarachnoid spread observed. Permanent images saved into the patient's record.  Post-operative Assessment:  Post-procedure Vital Signs:  Pulse Rate: 87 Temp: (!) 97.2 F (36.2 C) Resp: 18 BP: (!) 162/71 SpO2: 100 %  EBL: None  Complications: No immediate post-treatment complications observed by team, or reported by patient.  Note: The patient tolerated the entire procedure well. A repeat set of vitals were taken after the procedure and the patient was kept under observation following institutional policy, for this type of procedure. Post-procedural neurological assessment was performed, showing return to baseline, prior to discharge. The patient was provided with post-procedure discharge instructions, including a section on how to identify potential problems. Should any problems arise concerning this procedure, the patient was given instructions to immediately contact us, at any time, without hesitation. In any case, we plan to contact the patient by telephone for a follow-up status report regarding this interventional procedure.  Comments:  No additional relevant information.  5 out of 5 strength bilateral lower extremity: Plantar flexion, dorsiflexion, knee flexion, knee extension.  Plan of Care   Imaging Orders         DG PAIN CLINIC C-ARM 1-60 MIN NO REPORT     Procedure Orders    No procedure(s) ordered today    Medications ordered for procedure: Meds ordered this encounter  Medications   iohexol (OMNIPAQUE) 180 MG/ML injection 10 mL    Must be Myelogram-compatible. If not available, you may substitute with a water-soluble, non-ionic, hypoallergenic, myelogram-compatible radiological contrast medium.   lidocaine (XYLOCAINE) 2 % (with pres) injection 400 mg   dexamethasone (DECADRON) injection 10 mg   ropivacaine (PF) 2 mg/mL  (0.2%) (NAROPIN) injection 2 mL   sodium chloride flush (NS) 0.9 % injection 2 mL    Medications administered: We administered iohexol, lidocaine, dexamethasone, ropivacaine (PF) 2 mg/mL (0.2%), and sodium chloride flush.  See the medical record for exact dosing, route, and time of administration.  New Prescriptions   No medications on file   Disposition: Discharge home  Discharge Date & Time: 02/07/2021;   hrs.   Physician-requested Follow-up: Return in about 5 weeks (around 03/14/2021) for Post Procedure Evaluation, virtual.  Future Appointments  Date Time Provider [place]  03/07/2021  3:40 PM [name], MD [facility] None    Primary Care Physician: Gladstone Lighter, MD Location: [facility] Outpatient Pain Management Facility Note by: [name], MD Date: 02/07/2021; Time: 1:43 PM  Disclaimer:  Medicine is not an exact science. The only guarantee in medicine is that nothing is guaranteed. It is important to note that the decision to proceed with this intervention was based on the information collected from the patient. The Data and conclusions were drawn from the patient's questionnaire, the interview, and the physical examination. Because the information was provided in large part by the patient, it cannot be guaranteed that it has not been purposely or unconsciously manipulated. Every effort has been made to obtain as much relevant data as possible for this evaluation. It is important to note that the conclusions that lead to this procedure are derived in large part from the available data. Always take into account that the treatment will also be dependent on availability of resources and existing treatment guidelines, considered by other Pain Management Practitioners as being common knowledge and practice, at the time of the intervention. For Medico-Legal purposes, it is also important to point out that variation in procedural techniques and pharmacological choices are the  acceptable norm. The indications, contraindications, technique, and results of the above procedure should only be interpreted and judged by a Board-Certified Interventional Pain Specialist with extensive familiarity and expertise in the same exact procedure and technique.

## 2021-02-07 NOTE — Patient Instructions (Signed)

## 2021-02-08 ENCOUNTER — Telehealth: Payer: Self-pay | Admitting: *Deleted

## 2021-02-08 NOTE — Telephone Encounter (Signed)
No problems post procedure. 

## 2021-03-07 ENCOUNTER — Ambulatory Visit
Payer: Medicare Other | Attending: Student in an Organized Health Care Education/Training Program | Admitting: Student in an Organized Health Care Education/Training Program

## 2021-03-07 ENCOUNTER — Other Ambulatory Visit: Payer: Self-pay

## 2021-03-07 DIAGNOSIS — G894 Chronic pain syndrome: Secondary | ICD-10-CM

## 2021-03-07 DIAGNOSIS — M48061 Spinal stenosis, lumbar region without neurogenic claudication: Secondary | ICD-10-CM | POA: Diagnosis not present

## 2021-03-07 DIAGNOSIS — M5416 Radiculopathy, lumbar region: Secondary | ICD-10-CM

## 2021-03-07 NOTE — Progress Notes (Signed)
Patient: Erika Crawford  Service Category: E/M  Provider: [name], MD  DOB: Jul 10, 1940  DOS: 03/07/2021  Location: Office  MRN: [id number]  Setting: Ambulatory outpatient  Referring Provider: Gladstone Lighter, MD  Type: Established Patient  Specialty: Interventional Pain Management  PCP: Gladstone Lighter, MD  Location: Remote location  Delivery: TeleHealth     Virtual Encounter - Pain Management PROVIDER NOTE: Information contained herein reflects review and annotations entered in association with encounter. Interpretation of such information and data should be left to medically-trained personnel. Information provided to patient can be located elsewhere in the medical record under "Patient Instructions". Document created using STT-dictation technology, any transcriptional errors that may result from process are unintentional.    Contact & Pharmacy Preferred: [phone] Home: [phone] (home) Mobile: [phone] (mobile) E-mail: No e-mail address on record  [facility] [address], [place] [address] Phone: [phone] Fax: [phone]   Pre-screening  Erika Crawford offered "in-person" vs "virtual" encounter. She indicated preferring virtual for this encounter.   Reason COVID-19*  Social distancing based on CDC and AMA recommendations.   I contacted Erika Crawford on 03/07/2021 via telephone.      I clearly identified myself as [name], MD. I verified that I was speaking with the correct person using two identifiers (Name: Erika Crawford, and date of birth: Nov 14, 1940).  Consent I sought verbal advanced consent from Erika Crawford for virtual visit interactions. I informed Erika Crawford of possible security and privacy concerns, risks, and limitations associated with providing "not-in-person" medical evaluation and management services. I also informed Erika Crawford of the availability  of "in-person" appointments. Finally, I informed her that there would be a charge for the virtual visit and that she could be  personally, fully or partially, financially responsible for it. Erika Crawford expressed understanding and agreed to proceed.   Historic Elements   Erika Crawford is a 80 y.o. year old, female patient evaluated today after our last contact on 02/07/2021. Erika Crawford  has a past medical history of Arthritis, Complication of anesthesia, Hypertension, Hypothyroidism, Sleep apnea, and Thyroid disease. 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 Erika Crawford has a current medication list which includes the following prescription(s): acetaminophen, amlodipine, cholecalciferol, cyanocobalamin, cyclobenzaprine, diltiazem, fluticasone, gabapentin, hydrocodone-acetaminophen, levothyroxine, linaclotide, menthol (topical analgesic), mometasone, olmesartan, omeprazole, senna, trazodone, vitamin b-12, acetaminophen, alendronate, lidocaine, diltiazem, gabapentin, homeopathic products, hydrocodone-acetaminophen, levothyroxine, magnesium, magnesium gluconate, magnesium oxide, meloxicam, menthol (topical analgesic), naphazoline-pheniramine, olmesartan, ondansetron, prednisone, and senna-docusate. She  reports that she has quit smoking. She has never used smokeless tobacco. She reports current alcohol use. She reports that she does not use drugs. Erika Crawford is allergic to zanaflex [tizanidine], celebrex [celecoxib], codeine, etodolac, hydrochlorothiazide, oxycodone, pravastatin, relafen [nabumetone], and tramadol.   HPI  Today, she is being contacted for a post-procedure assessment.   Post-Procedure Evaluation  Procedure (02/07/2021):   Type: Therapeutic  Inter-Laminar Epidural Steroid Injection (previously done 05/19/2019) Region: Lumbar Level: L5-S1 Level. Laterality: Left-Sided         Anxiolysis: Please see nurses note.  Effectiveness during initial hour after procedure (Ultra-Short Term Relief): 100 %   Local anesthetic used: Long-acting (4-6 hours) Effectiveness: Defined as any analgesic benefit obtained  secondary to the administration of local anesthetics. This carries significant diagnostic value as to the etiological location, or anatomical origin, of the pain. Duration of benefit is expected to coincide with the duration of the local anesthetic used.  Effectiveness during initial 4-6 hours after procedure (Short-Term Relief): 100 %   Long-term benefit: Defined as any relief past the pharmacologic duration of the local anesthetics.  Effectiveness past the initial 6 hours after procedure (Long-Term Relief): 85 %   Benefits, current: Defined as benefit present at the time of this evaluation.   Analgesia:  85%-90% Function: Erika Crawford reports improvement in function ROM: Somewhat improved   Laboratory Chemistry Profile   Renal Lab Results  Component Value Date   BUN 15 09/17/2019   CREATININE 0.67 09/17/2019   GFRAA >60 09/17/2019   GFRNONAA >60 09/17/2019    Hepatic Lab Results  Component Value Date   AST 16 01/19/2014   ALT 25 01/19/2014   ALBUMIN 3.6 01/19/2014   ALKPHOS 62 01/19/2014    Electrolytes Lab Results  Component Value Date   NA 141 09/17/2019   K 3.5 09/17/2019   CL 107 09/17/2019   CALCIUM 8.8 (L) 09/17/2019    Bone No results found for: VD25OH, VD125OH2TOT, ZH2992EQ6, ST4196QI2, 25OHVITD1, 25OHVITD2, 25OHVITD3, TESTOFREE, TESTOSTERONE  Inflammation (CRP: Acute Phase) (ESR: Chronic Phase) No results found for: CRP, ESRSEDRATE, LATICACIDVEN       Note: Above Lab results reviewed.    Assessment  The primary encounter diagnosis was Lumbar radiculopathy. Diagnoses of Lumbar foraminal stenosis and  Chronic pain syndrome were also pertinent to this visit.  Plan of Care   Erika Crawford states that she is doing exceptionally well after her previous lumbar epidural steroid injection.  Endorses approximately 90% pain relief and improvement in range of motion and ability to do ADLs.  Instructed patient that we can repeat lumbar epidural steroid injection anytime after November 24 should she have increased lumbar radicular pain at that time.  Patient endorsed understanding.  She is overall pleased with how she is doing as well.  As needed order for lumbar epidural steroid injection in place.  Can release patient calls for lumbar epidural steroid injection after November 24.  Follow-up plan:   Return if symptoms worsen or fail to improve.     Previous lumbar epidurals have been very helpful in managing her lumbar radicular pain every 3 to every 4 months previously done on 01/04/2019.  status post SI joint injection 04/05/2019-not effective, repeat ESI prn.        Recent Visits Date Type Provider Dept  02/07/21 Procedure visit [name], MD [facility]  01/23/21 Office Visit [name], MD [facility]  01/18/21 Telemedicine [name], MD [facility]  Showing recent visits within past 90 days and meeting all other requirements Today's Visits Date Type Provider Dept  03/07/21 Telemedicine [name], MD [facility]  Showing today's visits and meeting all other requirements Future Appointments No visits were found meeting these conditions. Showing future appointments within next 90 days and meeting all other requirements I discussed the assessment and treatment plan with the patient. The patient was provided an opportunity to ask questions and all were answered. The patient agreed with the plan and demonstrated an understanding of the instructions.  Patient advised to call back or seek an in-person evaluation if the symptoms or condition  worsens.  Duration of encounter: 40mnutes.  Note by: BGillis Santa MD Date: 03/07/2021; Time: 3:12 PM

## 2021-05-05 ENCOUNTER — Emergency Department: Payer: Medicare Other

## 2021-05-05 ENCOUNTER — Other Ambulatory Visit: Payer: Self-pay

## 2021-05-05 ENCOUNTER — Emergency Department
Admission: EM | Admit: 2021-05-05 | Discharge: 2021-05-05 | Disposition: A | Discharge status: Home / Self Care | Payer: Medicare Other | Attending: Emergency Medicine | Admitting: Emergency Medicine

## 2021-05-05 DIAGNOSIS — I10 Essential (primary) hypertension: Secondary | ICD-10-CM | POA: Insufficient documentation

## 2021-05-05 DIAGNOSIS — Z96652 Presence of left artificial knee joint: Secondary | ICD-10-CM | POA: Diagnosis not present

## 2021-05-05 DIAGNOSIS — Z79899 Other long term (current) drug therapy: Secondary | ICD-10-CM | POA: Insufficient documentation

## 2021-05-05 DIAGNOSIS — R42 Dizziness and giddiness: Secondary | ICD-10-CM | POA: Diagnosis present

## 2021-05-05 DIAGNOSIS — Z87891 Personal history of nicotine dependence: Secondary | ICD-10-CM | POA: Diagnosis not present

## 2021-05-05 DIAGNOSIS — Z7951 Long term (current) use of inhaled steroids: Secondary | ICD-10-CM | POA: Diagnosis not present

## 2021-05-05 DIAGNOSIS — R519 Headache, unspecified: Secondary | ICD-10-CM | POA: Diagnosis not present

## 2021-05-05 DIAGNOSIS — R55 Syncope and collapse: Secondary | ICD-10-CM | POA: Diagnosis not present

## 2021-05-05 DIAGNOSIS — Z96611 Presence of right artificial shoulder joint: Secondary | ICD-10-CM | POA: Diagnosis not present

## 2021-05-05 DIAGNOSIS — E039 Hypothyroidism, unspecified: Secondary | ICD-10-CM | POA: Insufficient documentation

## 2021-05-05 LAB — CBC WITH DIFFERENTIAL/PLATELET
Abs Immature Granulocytes: 0.09 10*3/uL — ABNORMAL HIGH (ref 0.00–0.07)
Basophils Absolute: 0 10*3/uL (ref 0.0–0.1)
Basophils Relative: 0 %
Eosinophils Absolute: 0 10*3/uL (ref 0.0–0.5)
Eosinophils Relative: 0 %
HCT: 37.9 % (ref 36.0–46.0)
Hemoglobin: 12.8 g/dL (ref 12.0–15.0)
Immature Granulocytes: 1 %
Lymphocytes Relative: 28 %
Lymphs Abs: 2.7 10*3/uL (ref 0.7–4.0)
MCH: 33.4 pg (ref 26.0–34.0)
MCHC: 33.8 g/dL (ref 30.0–36.0)
MCV: 99 fL (ref 80.0–100.0)
Monocytes Absolute: 0.9 10*3/uL (ref 0.1–1.0)
Monocytes Relative: 9 %
Neutro Abs: 6.1 10*3/uL (ref 1.7–7.7)
Neutrophils Relative %: 62 %
Platelets: 195 10*3/uL (ref 150–400)
RBC: 3.83 MIL/uL — ABNORMAL LOW (ref 3.87–5.11)
RDW: 13.8 % (ref 11.5–15.5)
WBC: 9.9 10*3/uL (ref 4.0–10.5)
nRBC: 0 % (ref 0.0–0.2)

## 2021-05-05 LAB — BASIC METABOLIC PANEL
Anion gap: 6 (ref 5–15)
BUN: 26 mg/dL — ABNORMAL HIGH (ref 8–23)
CO2: 25 mmol/L (ref 22–32)
Calcium: 9.1 mg/dL (ref 8.9–10.3)
Chloride: 107 mmol/L (ref 98–111)
Creatinine, Ser: 0.78 mg/dL (ref 0.44–1.00)
GFR, Estimated: >60 mL/min (ref >60)
Glucose, Bld: 109 mg/dL — ABNORMAL HIGH (ref 70–99)
Potassium: 3.7 mmol/L (ref 3.5–5.1)
Sodium: 138 mmol/L (ref 135–145)

## 2021-05-05 NOTE — ED Notes (Signed)
Patient transported to CT 

## 2021-05-05 NOTE — ED Triage Notes (Signed)
Pt to ED for HTN since Wednesday. Has been taking meds as prescribed. C/o headache since Wednesday.

## 2021-05-05 NOTE — ED Provider Notes (Signed)
[facility] Emergency Department Provider Note   ____________________________________________   Event Date/Time   First MD Initiated Contact with Patient 05/05/21 1540     (approximate)  I have reviewed the triage vital signs and the nursing notes.   HISTORY  Chief Complaint Hypertension    HPI Erika Crawford is a 80 y.o. female with past medical history of hypertension, hypothyroidism, and chronic back pain who presents to the ED complaining of elevated blood pressure.  Patient reports that she was returning home 3 days ago but when she got to her front door she started to feel very lightheaded and ended up falling into her bush.  She states she was caught by the bush and did not hit her head, although she believes she may have lost consciousness for a couple of seconds.  She states she has been dealing with a headache since then and has noticed that her blood pressure has been running high.  She denies any associated neck pain, vision changes, speech changes, numbness, or weakness.  She has not had any chest pain or shortness of breath.  She reports being compliant with her usual blood pressure medications.        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   Acute bilateral low back pain without sciatica 02/04/2017   Dysuria 12/13/2016   Prediabetes 11/05/2016   Acquired  hypothyroidism 08/24/2016   Myofascial pain syndrome 08/24/2016   Essential hypertension 08/24/2016   Osteopenia of multiple sites 08/24/2016   Lumbar degenerative disc disease 08/24/2016   Bilateral hydronephrosis 07/15/2016   Rotator cuff tendinitis, left 06/21/2016   Status post total knee replacement using cement, 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   Rotator cuff tendinitis, right 11/27/2015   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   Injury of tendon of long head of right biceps 11/27/2015   Incomplete tear of right rotator cuff 11/27/2015   Obstructive sleep apnea on CPAP 07/13/2015   Osteoarthritis 11/04/2013   Disorder of uterus 04/29/1995    Past Surgical History:  Procedure Laterality Date   ABDOMINAL HYSTERECTOMY     BACK SURGERY     lower middle kyphoplasty   COLONOSCOPY WITH PROPOFOL N/A 09/21/2015   Procedure: COLONOSCOPY WITH PROPOFOL;  Surgeon: Hulen Luster, MD;  Location: [facility] ENDOSCOPY;  Service: Gastroenterology;  Laterality: N/A;   COLONOSCOPY WITH PROPOFOL N/A 04/24/2020   Procedure: COLONOSCOPY WITH PROPOFOL;  Surgeon: Lesly Rubenstein, MD;  Location: [facility] ENDOSCOPY;  Service: Endoscopy;  Laterality: N/A;   JOINT REPLACEMENT     03/19/2016- left knee replacement   REVERSE SHOULDER ARTHROPLASTY Right 08/25/2018   Procedure: REVERSE SHOULDER ARTHROPLASTY, RIGHT;  Surgeon: Corky Mull, MD;  Location: [facility] ORS;  Service: Orthopedics;  Laterality: Right;   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   Procedure: TOTAL KNEE ARTHROPLASTY;  Surgeon: Corky Mull, MD;  Location: [facility] ORS;  Service: Orthopedics;  Laterality: Left;    Prior to Admission medications   Medication Sig Start Date End Date Taking? Authorizing Provider  Acetaminophen (TYLENOL PO) Take 500 mg by mouth 2 (two) times daily.    [provider]  Acetaminophen 325 MG CAPS Take 1,000 mg by mouth as needed. Patient not taking: No sig reported    [provider]  alendronate (FOSAMAX) 70 MG  tablet Take 70 mg by mouth every Wednesday. Take with a full glass of water on an empty stomach.  Patient not taking: No sig reported    [provider]  amLODipine (NORVASC) 5 MG tablet Take 5 mg by mouth daily.    [provider]  ASPERCREME LIDOCAINE EX Apply 1 application topically daily as needed (pain). Patient not taking: No sig reported    [provider]  cholecalciferol (VITAMIN D3) 25 MCG (1000 UT) tablet Take 2,000 Units by mouth daily.    [provider]  cyanocobalamin 1000 MCG tablet Take by mouth.    [provider]  cyclobenzaprine (FLEXERIL) 10 MG tablet Take 1 tablet by mouth 3 (three) times daily as needed. 01/01/19   [provider]  diltiazem (CARDIZEM CD) 120 MG 24 hr capsule Take 120 mg by mouth daily.    [provider]  diltiazem (TIAZAC) 120 MG 24 hr capsule TAKE 1 CAPSULE(120 MG) BY MOUTH EVERY DAY Patient not taking: No sig reported 01/15/21   [provider]  fluticasone (FLONASE) 50 MCG/ACT nasal spray SHAKE LIQUID AND USE 2 SPRAYS IN EACH NOSTRIL EVERY DAY 11/20/20   [provider]  gabapentin (NEURONTIN) 100 MG capsule 100 mg twice daily for one week, then increase to 200 mg twice daily for one week, then 300mg  twice a day. Patient not taking: No sig reported 07/24/20   [provider]  gabapentin (NEURONTIN) 300 MG capsule 300 mg qday, 300 mg qPM, 600 mg qhs 07/14/18   [name], MD  Homeopathic Products [facility] RELIEF EX) Apply 1 application topically daily as needed (pain). Patient not taking: No sig reported    [provider]  HYDROcodone-acetaminophen (NORCO/VICODIN) 5-325 MG tablet Take 1-2 tablets by mouth every 4 (four) hours as needed for moderate pain (pain score 4-6). Patient taking differently: Take 1 tablet by mouth every 8 (eight) hours as needed for moderate pain (pain score 4-6). 08/26/18   Duanne Guess, PA-C  HYDROcodone-acetaminophen  (NORCO/VICODIN) 5-325 MG tablet Take 1 tablet by mouth daily as needed. Patient not taking: Reported on 03/06/2021 02/21/21   [provider]  levothyroxine (SYNTHROID) 88 MCG tablet Take by mouth. Patient not taking: No sig reported 01/17/21 04/17/21  [provider]  levothyroxine (SYNTHROID, LEVOTHROID) 88 MCG tablet Take 88 mcg by mouth daily before breakfast.     [provider]  linaclotide (LINZESS) 145 MCG CAPS capsule Take 145 mcg by mouth daily.    [provider]  Magnesium 250 MG TABS Take 250 mg by mouth 2 (two) times daily. Patient not taking: No sig reported    [provider]  magnesium gluconate 54mg /79ml syringe Take by mouth. Patient not taking: No sig reported    [provider]  magnesium oxide (MAG-OX) 400 MG tablet Take 250 mg by mouth daily. Patient not taking: No sig reported    [provider]  meloxicam (MOBIC) 15 MG tablet Take 15 mg by mouth. Patient not taking: No sig reported    [provider]  Menthol, Topical  Analgesic, (BIOFREEZE EX) Apply 1 application topically daily as needed (pain).    [provider]  Menthol, Topical Analgesic, ([place] ARTHRITIS EX) Apply 1 application topically daily as needed (pain). Patient not taking: No sig reported    [provider]  mometasone (NASONEX) 50 MCG/ACT nasal spray Place 2 sprays into the nose daily as needed (allergies).     [provider]  Naphazoline-Pheniramine (OPCON-A OP) Place 1 drop into both eyes daily as needed (allergies). Patient not taking: No sig reported    [provider]  olmesartan (BENICAR) 20 MG tablet Take by mouth. 03/06/20 03/06/21  [provider]  olmesartan (BENICAR) 40 MG tablet Take 40 mg by mouth daily. Patient not taking: No sig reported 06/24/17   [provider]  omeprazole (PRILOSEC) 20 MG capsule Take 20 mg by mouth daily.    [provider]  ondansetron  (ZOFRAN-ODT) 4 MG disintegrating tablet Take 1 tablet (4 mg total) by mouth every 8 (eight) hours as needed. Patient not taking: No sig reported 02/04/19   Versie Starks, PA-C  predniSONE (STERAPRED UNI-PAK 21 TAB) 10 MG (21) TBPK tablet Take 6 pills on day one then decrease by 1 pill each day Patient not taking: No sig reported 02/04/19   Versie Starks, PA-C  senna (SENOKOT) 8.6 MG tablet Take 1 tablet by mouth 2 (two) times daily as needed for constipation.    [provider]  senna-docusate (SENOKOT-S) 8.6-50 MG tablet Take by mouth. Patient not taking: No sig reported    [provider]  traZODone (DESYREL) 50 MG tablet Take 50 mg by mouth at bedtime. 02/01/19 03/06/21  [provider]  vitamin B-12 (CYANOCOBALAMIN) 1000 MCG tablet Take 1,000 mcg by mouth daily.    [provider]    Allergies Zanaflex [tizanidine], Celebrex [celecoxib], Codeine, Etodolac, Hydrochlorothiazide, Oxycodone, Pravastatin, Relafen [nabumetone], and Tramadol  Family History  Problem Relation Age of Onset   Breast cancer Neg Hx     Social History Social History   Tobacco Use   Smoking status: Former   Smokeless tobacco: Never   Tobacco comments:    quit 30 years ago  Vaping Use   Vaping Use: Never used  Substance Use Topics   Alcohol use: Yes    Comment: very little   Drug use: No    Review of Systems  Constitutional: No fever/chills Eyes: No visual changes. ENT: No sore throat. Cardiovascular: Denies chest pain.  Positive for lightheadedness and syncope. Respiratory: Denies shortness of breath. Gastrointestinal: No abdominal pain.  No nausea, no vomiting.  No diarrhea.  No constipation. Genitourinary: Negative for dysuria. Musculoskeletal: Negative for back pain. Skin: Negative for rash. Neurological: Positive for headache, negative for focal weakness or numbness.  ____________________________________________   PHYSICAL EXAM:  VITAL SIGNS: ED  Triage Vitals  Enc Vitals Group     BP 05/05/21 1449 (!) 156/102     Pulse Rate 05/05/21 1449 90     Resp 05/05/21 1449 20     Temp 05/05/21 1449 97.8 F (36.6 C)     Temp Source 05/05/21 1449 Oral     SpO2 05/05/21 1449 96 %     Weight 05/05/21 1450 206 lb (93.4 kg)     Height 05/05/21 1450 5\' 7"  (1.702 m)     Head Circumference --      Peak Flow --      Pain Score 05/05/21 1450 5     Pain Loc --  Pain Edu? --      Excl. in [place]? --     Constitutional: Alert and oriented. Eyes: Conjunctivae are normal.  Pupils equal, round, and reactive to light bilaterally. Head: Atraumatic. Nose: No congestion/rhinnorhea. Mouth/Throat: Mucous membranes are moist. Neck: Normal ROM Cardiovascular: Normal rate, regular rhythm. Grossly normal heart sounds.  2+ radial pulses bilaterally. Respiratory: Normal respiratory effort.  No retractions. Lungs CTAB. Gastrointestinal: Soft and nontender. No distention. Genitourinary: deferred Musculoskeletal: No lower extremity tenderness nor edema. Neurologic:  Normal speech and language. No gross focal neurologic deficits are appreciated. Skin:  Skin is warm, dry and intact. No rash noted. Psychiatric: Mood and affect are normal. Speech and behavior are normal.  ____________________________________________   LABS (all labs ordered are listed, but only abnormal results are displayed)  Labs Reviewed  CBC WITH DIFFERENTIAL/PLATELET - Abnormal; Notable for the following components:      Result Value   RBC 3.83 (*)    Abs Immature Granulocytes 0.09 (*)    All other components within normal limits  BASIC METABOLIC PANEL - Abnormal; Notable for the following components:   Glucose, Bld 109 (*)    BUN 26 (*)    All other components within normal limits   ____________________________________________  EKG  ED ECG REPORT I, Blake Divine, the attending physician, personally viewed and interpreted this ECG.   Date: 05/05/2021  EKG Time: 16:04   Rate: 73  Rhythm: normal sinus rhythm, occasional PVC noted, unifocal  Axis: Normal  Intervals:none  ST&T Change: None   PROCEDURES  Procedure(s) performed (including Critical Care):  Procedures   ____________________________________________   INITIAL IMPRESSION / ASSESSMENT AND PLAN / ED COURSE      80 year old female with possible history of hypertension, hypothyroidism, and chronic back pain who presents to the ED complaining of headache and elevated blood pressure after having a brief syncopal episode 3 days ago resulting in her falling into a bush.  There are no signs of significant trauma to her head or neck area and she has no bony tenderness to her extremities, and no tenderness noted to chest or abdomen.  Given her ongoing headache following the fall, we will check CT head, but she has no focal neurologic deficits.  We will screen EKG for arrhythmia or ischemia, briefly observe on cardiac monitor.  Also check basic labs for anemia or electrolyte abnormality.  No obvious hypertensive emergency at this time, plan to reassess following work-up.  CT head is negative for acute process, labs are also unremarkable.  Patient's blood pressure seems with normalized without intervention.  Low suspicion for cardiac etiology of her syncopal episode at this time.  She is appropriate for discharge home with PCP follow-up for syncopal episode as well as recheck of blood pressure.  She was counseled to return to the ED for new worsening symptoms, patient agrees with plan.      ____________________________________________   FINAL CLINICAL IMPRESSION(S) / ED DIAGNOSES  Final diagnoses:  Acute nonintractable headache, unspecified headache type  Syncope, unspecified syncope type  Hypertension, unspecified type     ED Discharge Orders     None        Note:  This document was prepared using Dragon voice recognition software and may include unintentional dictation errors.     Blake Divine, MD 05/05/21 1723

## 2021-05-05 NOTE — ED Notes (Signed)
See triage note. Pt ambulatory to room. Pt stating has been having issues with her BP and a HA since Wednesday. Pt in NAD at this time

## 2021-08-23 ENCOUNTER — Other Ambulatory Visit: Payer: Self-pay | Admitting: Internal Medicine

## 2021-08-23 DIAGNOSIS — Z1231 Encounter for screening mammogram for malignant neoplasm of breast: Secondary | ICD-10-CM

## 2021-09-07 ENCOUNTER — Encounter: Payer: Self-pay | Admitting: Student in an Organized Health Care Education/Training Program

## 2021-09-10 ENCOUNTER — Other Ambulatory Visit: Payer: Self-pay

## 2021-09-10 ENCOUNTER — Ambulatory Visit
Payer: Medicare Other | Attending: Student in an Organized Health Care Education/Training Program | Admitting: Student in an Organized Health Care Education/Training Program

## 2021-09-10 ENCOUNTER — Encounter: Payer: Self-pay | Admitting: Student in an Organized Health Care Education/Training Program

## 2021-09-10 DIAGNOSIS — M5416 Radiculopathy, lumbar region: Secondary | ICD-10-CM

## 2021-09-10 NOTE — Progress Notes (Signed)
Acute on chronic pain has resolved ?No appointment needed ?

## 2021-10-02 ENCOUNTER — Ambulatory Visit
Admission: RE | Admit: 2021-10-02 | Discharge: 2021-10-02 | Disposition: A | Discharge status: Home / Self Care | Payer: Medicare Other | Source: Ambulatory Visit | Attending: Internal Medicine | Admitting: Internal Medicine

## 2021-10-02 DIAGNOSIS — Z1231 Encounter for screening mammogram for malignant neoplasm of breast: Secondary | ICD-10-CM | POA: Diagnosis present

## 2021-12-28 ENCOUNTER — Other Ambulatory Visit: Payer: Self-pay | Admitting: Student

## 2021-12-28 DIAGNOSIS — M84352S Stress fracture, left femur, sequela: Secondary | ICD-10-CM

## 2021-12-28 DIAGNOSIS — M79604 Pain in right leg: Secondary | ICD-10-CM

## 2022-01-07 ENCOUNTER — Encounter: Payer: Self-pay | Admitting: Student in an Organized Health Care Education/Training Program

## 2022-01-07 ENCOUNTER — Ambulatory Visit
Payer: Medicare Other | Attending: Student in an Organized Health Care Education/Training Program | Admitting: Student in an Organized Health Care Education/Training Program

## 2022-01-07 VITALS — BP 134/68 | HR 84 | Temp 97.1°F | Resp 16 | Ht 67.0 in | Wt 210.0 lb

## 2022-01-07 DIAGNOSIS — M5416 Radiculopathy, lumbar region: Secondary | ICD-10-CM | POA: Diagnosis present

## 2022-01-07 DIAGNOSIS — M5136 Other intervertebral disc degeneration, lumbar region: Secondary | ICD-10-CM | POA: Insufficient documentation

## 2022-01-07 DIAGNOSIS — G894 Chronic pain syndrome: Secondary | ICD-10-CM | POA: Diagnosis present

## 2022-01-07 DIAGNOSIS — M48061 Spinal stenosis, lumbar region without neurogenic claudication: Secondary | ICD-10-CM

## 2022-01-07 DIAGNOSIS — M51369 Other intervertebral disc degeneration, lumbar region without mention of lumbar back pain or lower extremity pain: Secondary | ICD-10-CM

## 2022-01-07 NOTE — Progress Notes (Signed)
PROVIDER NOTE: Information contained herein reflects review and annotations entered in association with encounter. Interpretation of such information and data should be left to medically-trained personnel. Information provided to patient can be located elsewhere in the medical record under "Patient Instructions". Document created using STT-dictation technology, any transcriptional errors that may result from process are unintentional.    Patient: Erika Crawford  Service Category: E/M  Provider: [name], MD  DOB: September 26, 1940  DOS: 01/07/2022  Specialty: Interventional Pain Management  MRN: [id number]  Setting: Ambulatory outpatient  PCP: Erika Lighter, MD  Type: Established Patient    Referring Provider: Gladstone Lighter, MD  Location: Office  Delivery: Face-to-face     HPI  [name], a 81 y.o. year old female, is here today because of her Lumbar radiculopathy [M54.16]. [name] primary complain today is Leg Pain (Back of thighs, bilat)  Last encounter: My last encounter with her was on 03/07/21 Pertinent problems: [name] has Lumbar radiculopathy; Spinal stenosis of lumbosacral region; Lumbar degenerative disc disease; and Chronic left-sided low back pain with left-sided sciatica on their pertinent problem list. Pain Assessment: Severity of Chronic pain is reported as a 0-No pain ("is a 10 in the am")/10. Location: Leg Right, Left (thighs)/denies. Onset: More than a month ago. Quality: Aching. Timing: Intermittent ("only in the mornings"). Modifying factor(s): topicals, hot showers. Vitals:  height is '5\' 7"'  (1.702 m) and weight is 210 lb (95.3 kg). Her temporal temperature is 97.1 F (36.2 C) (abnormal). Her blood pressure is 134/68 and her pulse is 84. Her respiration is 16 and oxygen saturation is 99%.   Reason for encounter: worsening of previously known (established) problem    [name] presents today with overall worsening low back pain that radiates into her  left>right hip and left>right posterior lateral thigh in a dermatomal distribution secondary to lumbar radicular pain.  She states that today is not as bad as her previous days.  Her previous lumbar epidural steroid injection was performed 02/07/21  which was a left L5-S1.  This injection helped decrease her left lumbar radicular pain by approximately 75% for approx 7 months and also improved her ability to ambulate.  Given increased pain that has not been responsive to medications and stretches, recommend repeat lumbar epidural steroid injection.  Risks and benefits reviewed and patient would like to proceed.  ROS  Constitutional: Denies any fever or chills Gastrointestinal: No reported hemesis, hematochezia, vomiting, or acute GI distress Musculoskeletal:  low back and increased left>right hip and leg pain Neurological: No reported episodes of acute onset apraxia, aphasia, dysarthria, agnosia, amnesia, paralysis, loss of coordination, or loss of consciousness  Medication Review  Acetaminophen, HYDROcodone-acetaminophen, Homeopathic Products, Lidocaine, Magnesium, Menthol (Topical Analgesic), Naphazoline-Pheniramine, alendronate, amLODipine, cholecalciferol, cyanocobalamin, cyclobenzaprine, diltiazem, fluticasone, gabapentin, levothyroxine, linaclotide, magnesium gluconate, magnesium oxide, meloxicam, mometasone, olmesartan, omeprazole, ondansetron, pramipexole, predniSONE, senna, senna-docusate, traZODone, and vitamin B-12  History Review  Allergy: [name] is allergic to zanaflex [tizanidine], celebrex [celecoxib], codeine, etodolac, hydrochlorothiazide, oxycodone, pravastatin, relafen [nabumetone], and tramadol. Drug: [name]  reports no history of drug use. Alcohol:  reports current alcohol use. Tobacco:  reports that she has quit smoking. She has never used smokeless tobacco. Social: [name]  reports that she has quit smoking. She has never used smokeless tobacco. She reports current  alcohol use. She reports that she does not use drugs. Medical:  has a past medical history of Arthritis, Complication of anesthesia, Hypertension, Hypothyroidism, Sleep apnea, and Thyroid disease. Surgical: [name]  has a past surgical history that includes Abdominal hysterectomy; Colonoscopy with propofol (N/A, 09/21/2015); Back surgery; Total knee arthroplasty (Left, 03/21/2016); Joint replacement; Reverse shoulder arthroplasty (Right, 08/25/2018); and Colonoscopy with propofol (N/A, 04/24/2020). Family: family history is not on file.  Laboratory Chemistry Profile   Renal Lab Results  Component Value Date   BUN 26 (H) 05/05/2021   CREATININE 0.78 05/05/2021   GFRAA >60 09/17/2019   GFRNONAA >60 05/05/2021    Hepatic Lab Results  Component Value Date   AST 16 01/19/2014   ALT 25 01/19/2014   ALBUMIN 3.6 01/19/2014   ALKPHOS 62 01/19/2014    Electrolytes Lab Results  Component Value Date   NA 138 05/05/2021   K 3.7 05/05/2021   CL 107 05/05/2021   CALCIUM 9.1 05/05/2021    Bone No results found for: "VD25OH", "VD125OH2TOT", "VO5929WK4", "QK8638TR7", "25OHVITD1", "25OHVITD2", "25OHVITD3", "TESTOFREE", "TESTOSTERONE"  Inflammation (CRP: Acute Phase) (ESR: Chronic Phase) No results found for: "CRP", "ESRSEDRATE", "LATICACIDVEN"       Note: Above Lab results reviewed.  Recent Imaging Review  MM 3D SCREEN BREAST BILATERAL CLINICAL DATA:  Screening.  EXAM: DIGITAL SCREENING BILATERAL MAMMOGRAM WITH TOMOSYNTHESIS AND CAD  TECHNIQUE: Bilateral screening digital craniocaudal and mediolateral oblique mammograms were obtained. Bilateral screening digital breast tomosynthesis was performed. The images were evaluated with computer-aided detection.  COMPARISON:  Previous exam(s).  ACR Breast Density Category b: There are scattered areas of fibroglandular density.  FINDINGS: There are no findings suspicious for malignancy.  IMPRESSION: No mammographic evidence of malignancy.  A result letter of this screening mammogram will be mailed directly to the patient.  RECOMMENDATION: Screening mammogram in one year. (Code:SM-B-01Y)  BI-RADS CATEGORY  1: Negative.  Electronically Signed   By: Fidela Salisbury M.D.   On: 10/02/2021 16:27  Note: Reviewed        Physical Exam  General appearance: Well nourished, well developed, and well hydrated. In no apparent acute distress Mental status: Alert, oriented x 3 (person, place, & time)       Respiratory: No evidence of acute respiratory distress Eyes: PERLA Vitals: BP 134/68   Pulse 84   Temp (!) 97.1 F (36.2 C) (Temporal)   Resp 16   Ht '5\' 7"'  (1.702 m)   Wt 210 lb (95.3 kg)   SpO2 99%   BMI 32.89 kg/m  BMI: Estimated body mass index is 32.89 kg/m as calculated from the following:   Height as of this encounter: '5\' 7"'  (1.702 m).   Weight as of this encounter: 210 lb (95.3 kg). Ideal: Ideal body weight: 61.6 kg (135 lb 12.9 oz) Adjusted ideal body weight: 75.1 kg (165 lb 7.7 oz)  Lumbar Spine Area Exam  Skin & Axial Inspection: No masses, redness, or swelling Alignment: Symmetrical Functional ROM: Decreased ROM       Stability: No instability detected Muscle Tone/Strength: Functionally intact. No obvious neuro-muscular anomalies detected. Sensory (Neurological): Dermatomal pain pattern Palpation: No palpable anomalies       Provocative Tests: Hyperextension/rotation test: deferred today       Lumbar quadrant test (Kemp's test): (+) on the left for foraminal stenosis  Gait & Posture Assessment  Ambulation: Unassisted Gait: Antalgic gait (limping) Posture: Difficulty standing up straight, due to pain  Lower Extremity Exam    Side: Right lower extremity  Side: Left lower extremity  Stability: No instability observed          Stability: No instability observed          Skin &  Extremity Inspection: Skin color, temperature, and hair growth are WNL. No peripheral edema or cyanosis. No masses, redness,  swelling, asymmetry, or associated skin lesions. No contractures.  Skin & Extremity Inspection: Skin color, temperature, and hair growth are WNL. No peripheral edema or cyanosis. No masses, redness, swelling, asymmetry, or associated skin lesions. No contractures.  Functional ROM: Unrestricted ROM                  Functional ROM: Decreased ROM                  Muscle Tone/Strength: Functionally intact. No obvious neuro-muscular anomalies detected.  Muscle Tone/Strength: Functionally intact. No obvious neuro-muscular anomalies detected.  Sensory (Neurological): Unimpaired        Sensory (Neurological): Dermatomal pain pattern        DTR: Patellar: deferred today Achilles: deferred today Plantar: deferred today  DTR: Patellar: deferred today Achilles: deferred today Plantar: deferred today  Palpation: No palpable anomalies  Palpation: No palpable anomalies    Assessment   Status Diagnosis  Having a Flare-up Having a Flare-up Controlled 1. Lumbar radiculopathy   2. Lumbar foraminal stenosis   3. Lumbar degenerative disc disease   4. Chronic pain syndrome      Updated Problems: No problems updated.   Plan of Care    [name] has a current medication list which includes the following long-term medication(s): diltiazem, diltiazem, gabapentin, gabapentin, levothyroxine, levothyroxine, linaclotide, mometasone, olmesartan, olmesartan, omeprazole, pramipexole, and trazodone.  Orders:  Orders Placed This Encounter  Procedures   Lumbar Epidural Injection    Standing Status:   Future    Standing Expiration Date:   02/07/2022    Scheduling Instructions:     Procedure: Interlaminar Lumbar Epidural Steroid injection (LESI)       L5/S1     Laterality: Midline     Sedation: without     Timeframe: ASAA    Order Specific Question:   Where will this procedure be performed?    Answer:   [facility]   Follow-up plan:   Return in about 2 weeks (around 01/21/2022) for  Lumbar (L4/5 or L5/S1) ESI , in clinic [facility].    Recent Visits No visits were found meeting these conditions. Showing recent visits within past 90 days and meeting all other requirements Today's Visits Date Type Provider Dept  01/07/22 Office Visit Erika Santa, MD [facility]  Showing today's visits and meeting all other requirements Future Appointments No visits were found meeting these conditions. Showing future appointments within next 90 days and meeting all other requirements  I discussed the assessment and treatment plan with the patient. The patient was provided an opportunity to ask questions and all were answered. The patient agreed with the plan and demonstrated an understanding of the instructions.  Patient advised to call back or seek an in-person evaluation if the symptoms or condition worsens.  Duration of encounter: 30 minutes.  Note by: Erika Santa, MD Date: 01/07/2022; Time: 2:00 PM

## 2022-01-07 NOTE — Progress Notes (Signed)
Safety precautions to be maintained throughout the outpatient stay will include: orient to surroundings, keep bed in low position, maintain call bell within reach at all times, provide assistance with transfer out of bed and ambulation.  

## 2022-01-10 ENCOUNTER — Ambulatory Visit: Payer: Medicare Other

## 2022-01-23 ENCOUNTER — Ambulatory Visit
Admission: RE | Admit: 2022-01-23 | Discharge: 2022-01-23 | Disposition: A | Discharge status: Home / Self Care | Payer: Medicare Other | Source: Ambulatory Visit | Attending: Student in an Organized Health Care Education/Training Program | Admitting: Student in an Organized Health Care Education/Training Program

## 2022-01-23 ENCOUNTER — Encounter: Payer: Self-pay | Admitting: Student in an Organized Health Care Education/Training Program

## 2022-01-23 ENCOUNTER — Ambulatory Visit
Payer: Medicare Other | Attending: Student in an Organized Health Care Education/Training Program | Admitting: Student in an Organized Health Care Education/Training Program

## 2022-01-23 DIAGNOSIS — M5416 Radiculopathy, lumbar region: Secondary | ICD-10-CM

## 2022-01-23 DIAGNOSIS — G894 Chronic pain syndrome: Secondary | ICD-10-CM | POA: Diagnosis present

## 2022-01-23 DIAGNOSIS — M48061 Spinal stenosis, lumbar region without neurogenic claudication: Secondary | ICD-10-CM | POA: Diagnosis present

## 2022-01-23 MED ORDER — SODIUM CHLORIDE (PF) 0.9 % IJ SOLN
INTRAMUSCULAR | Status: AC
  Filled 2022-01-23: qty 10

## 2022-01-23 MED ORDER — IOHEXOL 180 MG/ML  SOLN
10.0000 mL | Freq: Once | INTRAMUSCULAR | Status: AC
  Administered 2022-01-23: 10 mL via EPIDURAL
  Filled 2022-01-23: qty 20

## 2022-01-23 MED ORDER — DEXAMETHASONE SODIUM PHOSPHATE 10 MG/ML IJ SOLN
10.0000 mg | Freq: Once | INTRAMUSCULAR | Status: AC
  Administered 2022-01-23: 10 mg

## 2022-01-23 MED ORDER — ROPIVACAINE HCL 2 MG/ML IJ SOLN
INTRAMUSCULAR | Status: AC
  Filled 2022-01-23: qty 20

## 2022-01-23 MED ORDER — ROPIVACAINE HCL 2 MG/ML IJ SOLN
2.0000 mL | Freq: Once | INTRAMUSCULAR | Status: AC
  Administered 2022-01-23: 2 mL via EPIDURAL

## 2022-01-23 MED ORDER — SODIUM CHLORIDE 0.9% FLUSH
2.0000 mL | Freq: Once | INTRAVENOUS | Status: AC
  Administered 2022-01-23: 2 mL

## 2022-01-23 MED ORDER — DEXAMETHASONE SODIUM PHOSPHATE 10 MG/ML IJ SOLN
INTRAMUSCULAR | Status: AC
  Filled 2022-01-23: qty 1

## 2022-01-23 MED ORDER — LIDOCAINE HCL 2 % IJ SOLN
20.0000 mL | Freq: Once | INTRAMUSCULAR | Status: AC
  Administered 2022-01-23: 100 mg

## 2022-01-23 NOTE — Progress Notes (Signed)
Patient's Name: Erika Crawford  MRN: [id number]  Referring Provider: [name], MD  DOB: 09/08/1940  PCP: Gladstone Lighter, MD  DOS: 01/23/2022  Note by: [name], MD  Service setting: Ambulatory outpatient  Specialty: Interventional Pain Management  Patient type: Established  Location: [facility] (AMB) Pain Management Facility  Visit type: Interventional Procedure   Primary Reason for Visit: Interventional Pain Management Treatment. CC: Leg Pain (bilateral)  Procedure:       Anesthesia, Analgesia, Anxiolysis:  Type: Therapeutic Inter-Laminar Epidural Steroid Injection (previously done 02/07/21 Region: Lumbar Level: L5-S1 Level. Laterality: Left-Sided         Type: Local Anesthesia Indication(s): Analgesia         Route: Infiltration ([place]/IM) IV Access: Declined Sedation: Declined  Local Anesthetic: Lidocaine 1-2%   Indications: 1. Lumbar radiculopathy   2. Lumbar foraminal stenosis   3. Chronic pain syndrome    Pain Score: Pre-procedure: 8/10 Post-procedure:4//10  Pre-op Assessment:  Erika Crawford is a 81 y.o. (year old), female patient, seen today for interventional treatment. 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 Erika Crawford has a current medication list which includes the following prescription(s): acetaminophen, amlodipine, cholecalciferol, cyanocobalamin, cyclobenzaprine, diltiazem, fluticasone, gabapentin, hydrocodone-acetaminophen, hydrocodone-acetaminophen, levothyroxine, linaclotide, menthol (topical analgesic), mometasone, omeprazole, pramipexole, senna, cyanocobalamin, acetaminophen, alendronate, lidocaine, diltiazem, gabapentin, homeopathic products, levothyroxine, magnesium, magnesium gluconate, magnesium oxide, meloxicam,  menthol (topical analgesic), naphazoline-pheniramine, olmesartan, olmesartan, ondansetron, prednisone, senna-docusate, and trazodone. Her primarily concern today is the Leg Pain (bilateral)   Initial Vital Signs:  Pulse Rate: 91 Temp: (!) 97.3 F (36.3 C) Resp: 18 BP: (!) 161/82 SpO2: 98 %  BMI: Estimated body mass index is 31.95 kg/m as calculated from the following:   Height as of this encounter: '5\' 7"'$  (1.702 m).   Weight as of this encounter: 204 lb (92.5 kg).  Risk Assessment: Allergies: Reviewed. She is allergic to zanaflex [tizanidine], celebrex [celecoxib], codeine, etodolac, hydrochlorothiazide, oxycodone, pravastatin, relafen [nabumetone], and tramadol.  Allergy Precautions: None required Coagulopathies: Reviewed. None identified.  Blood-thinner therapy: None at this time Active Infection(s): Reviewed. None identified. Erika Crawford is afebrile  Site Confirmation: Erika Crawford was asked to confirm the procedure and laterality before marking the site Procedure checklist: Completed Consent: Before the procedure and under the influence of no sedative(s), amnesic(s), or anxiolytics, the patient was informed of the treatment options, risks and possible complications. To fulfill our ethical and legal obligations, as recommended by the American Medical Association's Code of Ethics, I have informed the patient of my clinical impression; the nature and purpose of the treatment or procedure; the risks, benefits, and possible complications of the intervention; the alternatives, including doing nothing; the risk(s) and benefit(s) of the alternative treatment(s) or procedure(s); and the risk(s) and benefit(s) of doing nothing. The patient was provided information about the general risks and possible complications associated with the procedure. These may include, but are not limited to: failure to achieve desired goals, infection, bleeding, organ or nerve damage, allergic reactions, paralysis, and  death. In addition, the patient was informed of those risks and complications associated to Spine-related procedures, such as failure to decrease pain; infection (i.e.: Meningitis, epidural or intraspinal abscess); bleeding (i.e.: epidural hematoma, subarachnoid hemorrhage, or any other type of intraspinal or peri-dural bleeding); organ or nerve damage (i.e.: Any type of peripheral nerve, nerve root, or spinal cord injury)  with subsequent damage to sensory, motor, and/or autonomic systems, resulting in permanent pain, numbness, and/or weakness of one or several areas of the body; allergic reactions; (i.e.: anaphylactic reaction); and/or death. Furthermore, the patient was informed of those risks and complications associated with the medications. These include, but are not limited to: allergic reactions (i.e.: anaphylactic or anaphylactoid reaction(s)); adrenal axis suppression; blood sugar elevation that in diabetics may result in ketoacidosis or comma; water retention that in patients with history of congestive heart failure may result in shortness of breath, pulmonary edema, and decompensation with resultant heart failure; weight gain; swelling or edema; medication-induced neural toxicity; particulate matter embolism and blood vessel occlusion with resultant organ, and/or nervous system infarction; and/or aseptic necrosis of one or more joints. Finally, the patient was informed that Medicine is not an exact science; therefore, there is also the possibility of unforeseen or unpredictable risks and/or possible complications that may result in a catastrophic outcome. The patient indicated having understood very clearly. We have given the patient no guarantees and we have made no promises. Enough time was given to the patient to ask questions, all of which were answered to the patient's satisfaction. Erika Crawford has indicated that she wanted to continue with the procedure. Attestation: I, the ordering provider,  attest that I have discussed with the patient the benefits, risks, side-effects, alternatives, likelihood of achieving goals, and potential problems during recovery for the procedure that I have provided informed consent. Date  Time:   Pre-Procedure Preparation:  Monitoring: As per clinic protocol. Respiration, ETCO2, SpO2, BP, heart rate and rhythm monitor placed and checked for adequate function Safety Precautions: Patient was assessed for positional comfort and pressure points before starting the procedure. Time-out: I initiated and conducted the "Time-out" before starting the procedure, as per protocol. The patient was asked to participate by confirming the accuracy of the "Time Out" information. Verification of the correct person, site, and procedure were performed and confirmed by me, the nursing staff, and the patient. "Time-out" conducted as per Joint Commission's Universal Protocol (UP.01.01.01). Time: 1130  Description of Procedure:       Position: Prone with head of the table was raised to facilitate breathing. Target Area: The interlaminar space, initially targeting the lower laminar border of the superior vertebral body. Approach: Paramedial approach. Area Prepped: Entire Posterior Lumbar Region Prepping solution: ChloraPrep (2% chlorhexidine gluconate and 70% isopropyl alcohol) Safety Precautions: Aspiration looking for blood return was conducted prior to all injections. At no point did we inject any substances, as a needle was being advanced. No attempts were made at seeking any paresthesias. Safe injection practices and needle disposal techniques used. Medications properly checked for expiration dates. SDV (single dose vial) medications used. Description of the Procedure: Protocol guidelines were followed. The procedure needle was introduced through the skin, ipsilateral to the reported pain, and advanced to the target area. Bone was contacted and the needle walked caudad, until the  lamina was cleared. The epidural space was identified using "loss-of-resistance technique" with 2-3 ml of PF-NaCl (0.9% NSS), in a 5cc LOR glass syringe. Vitals:   01/23/22 1101 01/23/22 1127 01/23/22 1132  BP: (!) 161/82 (!) 173/88 (!) 183/99  Pulse: 91    Resp: '18 18 18  '$ Temp: (!) 97.3 F (36.3 C)    SpO2: 98% 96% 98%  Weight: 204 lb (92.5 kg)    Height: '5\' 7"'$  (1.702 m)      Start Time: 1130 hrs. End Time: 1134 hrs. Materials:  Needle(s) Type: Epidural  needle Gauge: 17G Length: 5-in Medication(s): Please see orders for medications and dosing details. 6CC solution made of 3 cc of preservative-free saline, 2 cc of 0.2% ropivacaine, 1 cc of Decadron 10 mg per cc Imaging Guidance (Spinal):  Type of Imaging Technique: Fluoroscopy Guidance (Spinal) Indication(s): Assistance in needle guidance and placement for procedures requiring needle placement in or near specific anatomical locations not easily accessible without such assistance. Exposure Time: Please see nurses notes. Contrast: Before injecting any contrast, we confirmed that the patient did not have an allergy to iodine, shellfish, or radiological contrast. Once satisfactory needle placement was completed at the desired level, radiological contrast was injected. Contrast injected under live fluoroscopy. No contrast complications. See chart for type and volume of contrast used. Fluoroscopic Guidance: I was personally present during the use of fluoroscopy. "Tunnel Vision Technique" used to obtain the best possible view of the target area. Parallax error corrected before commencing the procedure. "Direction-depth-direction" technique used to introduce the needle under continuous pulsed fluoroscopy. Once target was reached, antero-posterior, oblique, and lateral fluoroscopic projection used confirm needle placement in all planes. Images permanently stored in EMR. Interpretation: I personally interpreted the imaging intraoperatively. Adequate  needle placement confirmed in multiple planes. Appropriate spread of contrast into desired area was observed. No evidence of afferent or efferent intravascular uptake. No intrathecal or subarachnoid spread observed. Permanent images saved into the patient's record.  Post-operative Assessment:  Post-procedure Vital Signs:  Pulse Rate: 91 Temp: (!) 97.3 F (36.3 C) Resp: 18 BP: (!) 183/99 SpO2: 98 %  EBL: None  Complications: No immediate post-treatment complications observed by team, or reported by patient.  Note: The patient tolerated the entire procedure well. A repeat set of vitals were taken after the procedure and the patient was kept under observation following institutional policy, for this type of procedure. Post-procedural neurological assessment was performed, showing return to baseline, prior to discharge. The patient was provided with post-procedure discharge instructions, including a section on how to identify potential problems. Should any problems arise concerning this procedure, the patient was given instructions to immediately contact us, at any time, without hesitation. In any case, we plan to contact the patient by telephone for a follow-up status report regarding this interventional procedure.  Comments:  No additional relevant information.  5 out of 5 strength bilateral lower extremity: Plantar flexion, dorsiflexion, knee flexion, knee extension.  Plan of Care   Imaging Orders         DG PAIN CLINIC C-ARM 1-60 MIN NO REPORT     Procedure Orders    No procedure(s) ordered today    Medications ordered for procedure: Meds ordered this encounter  Medications   iohexol (OMNIPAQUE) 180 MG/ML injection 10 mL    Must be Myelogram-compatible. If not available, you may substitute with a water-soluble, non-ionic, hypoallergenic, myelogram-compatible radiological contrast medium.   lidocaine (XYLOCAINE) 2 % (with pres) injection 400 mg   ropivacaine (PF) 2 mg/mL (0.2%)  (NAROPIN) injection 2 mL   sodium chloride flush (NS) 0.9 % injection 2 mL   dexamethasone (DECADRON) injection 10 mg    Medications administered: We administered iohexol, lidocaine, ropivacaine (PF) 2 mg/mL (0.2%), sodium chloride flush, and dexamethasone.  See the medical record for exact dosing, route, and time of administration.  New Prescriptions   No medications on file   Disposition: Discharge home  Discharge Date & Time: 01/23/2022; 1150 hrs.   Physician-requested Follow-up: Return in about 5 weeks (around 02/27/2022) for Post Procedure Evaluation, virtual.  Future  Appointments  Date Time Provider [place]  02/27/2022  3:00 PM [name], MD [facility] None    Primary Care Physician: Gladstone Lighter, MD Location: [facility] [place] [facility] Note by: [name], MD Date: 01/23/2022; Time: 12:14 PM  Disclaimer:  Medicine is not an exact science. The only guarantee in medicine is that nothing is guaranteed. It is important to note that the decision to proceed with this intervention was based on the information collected from the patient. The Data and conclusions were drawn from the patient's questionnaire, the interview, and the physical examination. Because the information was provided in large part by the patient, it cannot be guaranteed that it has not been purposely or unconsciously manipulated. Every effort has been made to obtain as much relevant data as possible for this evaluation. It is important to note that the conclusions that lead to this procedure are derived in large part from the available data. Always take into account that the treatment will also be dependent on availability of resources and existing treatment guidelines, considered by other Pain Management Practitioners as being common knowledge and practice, at the time of the intervention. For Medico-Legal purposes, it is also important to point out that variation in procedural techniques  and pharmacological choices are the acceptable norm. The indications, contraindications, technique, and results of the above procedure should only be interpreted and judged by a Board-Certified Interventional Pain Specialist with extensive familiarity and expertise in the same exact procedure and technique.

## 2022-01-23 NOTE — Patient Instructions (Signed)
Pain Management Discharge Instructions  General Discharge Instructions :  If you need to reach your doctor call: Monday-Friday 8:00 am - 4:00 pm at [phone] or toll free [phone].  After clinic hours [phone] to have operator reach doctor.  Bring all of your medication bottles to all your appointments in the pain clinic.  To cancel or reschedule your appointment with Pain Management please remember to call 24 hours in advance to avoid a fee.  Refer to the educational materials which you have been given on: General Risks, I had my Procedure. Discharge Instructions, Post Sedation.  Post Procedure Instructions:  The drugs you were given will stay in your system until tomorrow, so for the next 24 hours you should not drive, make any legal decisions or drink any alcoholic beverages.  You may eat anything you prefer, but it is better to start with liquids then soups and crackers, and gradually work up to solid foods.  Please notify your doctor immediately if you have any unusual bleeding, trouble breathing or pain that is not related to your normal pain.  Depending on the type of procedure that was done, some parts of your body may feel week and/or numb.  This usually clears up by tonight or the next day.  Walk with the use of an assistive device or accompanied by an adult for the 24 hours.  You may use ice on the affected area for the first 24 hours.  Put ice in a Ziploc bag and cover with a towel and place against area 15 minutes on 15 minutes off.  You may switch to heat after 24 hours.Epidural Steroid Injection Patient Information  Description: The epidural space surrounds the nerves as they exit the spinal cord.  In some patients, the nerves can be compressed and inflamed by a bulging disc or a tight spinal canal (spinal stenosis).  By injecting steroids into the epidural space, we can bring irritated nerves into direct contact with a potentially helpful medication.  These  steroids act directly on the irritated nerves and can reduce swelling and inflammation which often leads to decreased pain.  Epidural steroids may be injected anywhere along the spine and from the neck to the low back depending upon the location of your pain.   After numbing the skin with local anesthetic (like Novocaine), a small needle is passed into the epidural space slowly.  You may experience a sensation of pressure while this is being done.  The entire block usually last less than 10 minutes.  Conditions which may be treated by epidural steroids:  Low back and leg pain Neck and arm pain Spinal stenosis Post-laminectomy syndrome Herpes zoster (shingles) pain Pain from compression fractures  Preparation for the injection:  Do not eat any solid food or dairy products within 8 hours of your appointment.  You may drink clear liquids up to 3 hours before appointment.  Clear liquids include water, black coffee, juice or soda.  No milk or cream please. You may take your regular medication, including pain medications, with a sip of water before your appointment  Diabetics should hold regular insulin (if taken separately) and take 1/2 normal NPH dos the morning of the procedure.  Carry some sugar containing items with you to your appointment. A driver must accompany you and be prepared to drive you home after your procedure.  Bring all your current medications with your. An IV may be inserted and sedation may be given at the discretion of the physician.     A blood pressure cuff, EKG and other monitors will often be applied during the procedure.  Some patients may need to have extra oxygen administered for a short period. You will be asked to provide medical information, including your allergies, prior to the procedure.  We must know immediately if you are taking blood thinners (like Coumadin/Warfarin)  Or if you are allergic to IV iodine contrast (dye). We must know if you could possible be  pregnant.  Possible side-effects: Bleeding from needle site Infection (rare, may require surgery) Nerve injury (rare) Numbness & tingling (temporary) Difficulty urinating (rare, temporary) Spinal headache ( a headache worse with upright posture) Light -headedness (temporary) Pain at injection site (several days) Decreased blood pressure (temporary) Weakness in arm/leg (temporary) Pressure sensation in back/neck (temporary)  Call if you experience: Fever/chills associated with headache or increased back/neck pain. Headache worsened by an upright position. New onset weakness or numbness of an extremity below the injection site Hives or difficulty breathing (go to the emergency room) Inflammation or drainage at the infection site Severe back/neck pain Any new symptoms which are concerning to you  Please note:  Although the local anesthetic injected can often make your back or neck feel good for several hours after the injection, the pain will likely return.  It takes 3-7 days for steroids to work in the epidural space.  You may not notice any pain relief for at least that one week.  If effective, we will often do a series of three injections spaced 3-6 weeks apart to maximally decrease your pain.  After the initial series, we generally will wait several months before considering a repeat injection of the same type.  If you have any questions, please call [phone] [facility] 

## 2022-01-23 NOTE — Progress Notes (Signed)
Safety precautions to be maintained throughout the outpatient stay will include: orient to surroundings, keep bed in low position, maintain call bell within reach at all times, provide assistance with transfer out of bed and ambulation.  

## 2022-01-24 ENCOUNTER — Telehealth: Payer: Self-pay | Admitting: *Deleted

## 2022-01-24 NOTE — Telephone Encounter (Signed)
Post procedure call;  patient reports that she is doing well and pain is much improved.

## 2022-02-27 ENCOUNTER — Encounter: Payer: Self-pay | Admitting: Student in an Organized Health Care Education/Training Program

## 2022-02-27 ENCOUNTER — Ambulatory Visit
Payer: Medicare Other | Attending: Student in an Organized Health Care Education/Training Program | Admitting: Student in an Organized Health Care Education/Training Program

## 2022-02-27 DIAGNOSIS — G894 Chronic pain syndrome: Secondary | ICD-10-CM

## 2022-02-27 DIAGNOSIS — M48061 Spinal stenosis, lumbar region without neurogenic claudication: Secondary | ICD-10-CM

## 2022-02-27 DIAGNOSIS — M5416 Radiculopathy, lumbar region: Secondary | ICD-10-CM | POA: Diagnosis not present

## 2022-02-27 NOTE — Progress Notes (Signed)
Patient: Erika Crawford  Service Category: E/M  Provider: Bilal Lateef, MD  DOB: 06/04/1941  DOS: 02/27/2022  Location: Office  MRN: [id number]  Setting: Ambulatory outpatient  Referring Provider: Kalisetti, Radhika, MD  Type: Established Patient  Specialty: Interventional Pain Management  PCP: Kalisetti, Radhika, MD  Location: Remote location  Delivery: TeleHealth     Virtual Encounter - Pain Management PROVIDER NOTE: Information contained herein reflects review and annotations entered in association with encounter. Interpretation of such information and data should be left to medically-trained personnel. Information provided to patient can be located elsewhere in the medical record under "Patient Instructions". Document created using STT-dictation technology, any transcriptional errors that may result from process are unintentional.    Contact & Pharmacy Preferred: [phone] Home: [phone] (home) Mobile: [phone] (mobile) E-mail: No e-mail address on record  [id number] - [place], [place] - [address] [place] [address] Phone: [phone] Fax: [phone]   Pre-screening  Erika Crawford offered "in-person" vs "virtual" encounter. She indicated preferring virtual for this encounter.   Reason COVID-19*  Social distancing based on CDC and AMA recommendations.   I contacted Erika Crawford on 02/27/2022 via telephone.      I clearly identified myself as Bilal Lateef, MD. I verified that I was speaking with the correct person using two identifiers (Name: Erika Crawford, and date of birth: 09/28/1940).  Consent I sought verbal advanced consent from Erika Crawford for virtual visit interactions. I informed Erika Crawford of possible security and privacy concerns, risks, and limitations associated with providing "not-in-person" medical evaluation and management services. I also informed Erika Crawford of the availability  of "in-person" appointments. Finally, I informed her that there would be a charge for the virtual visit and that she could be  personally, fully or partially, financially responsible for it. Erika Crawford expressed understanding and agreed to proceed.   Historic Elements   Erika Crawford is a 81 y.o. year old, female patient evaluated today after our last contact on 01/23/2022. Erika Crawford  has a past medical history of Arthritis, Complication of anesthesia, Hypertension, Hypothyroidism, Sleep apnea, and Thyroid disease. 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 Erika Crawford has a current medication list which includes the following prescription(s): acetaminophen, alendronate, amlodipine, lidocaine, cholecalciferol, cyanocobalamin, cyclobenzaprine, diltiazem, fluticasone, hydrocodone-acetaminophen, hydrocodone-acetaminophen, levothyroxine, magnesium oxide, menthol (topical analgesic), mometasone, naphazoline-pheniramine, olmesartan, omeprazole, pramipexole, senna, senna-docusate, cyanocobalamin, acetaminophen, diltiazem, gabapentin, gabapentin, homeopathic products, levothyroxine, linaclotide, magnesium, magnesium gluconate, meloxicam, menthol (topical analgesic), olmesartan, ondansetron, prednisone, and trazodone. She  reports that she has quit smoking. She has never used smokeless tobacco. She reports current alcohol use. She reports that she does not use drugs. Erika Crawford is allergic to zanaflex [tizanidine], celebrex [celecoxib], codeine, etodolac, hydrochlorothiazide, oxycodone, pravastatin, relafen [nabumetone], and tramadol.   HPI  Today, she is being contacted for a post-procedure assessment.  Follow-up after left L5-S1 ESI.  Unfortunately, the patient  did not receive as much benefit with this epidural steroid injection as she has with previous ones.  Patient receives left L5-S1 epidural injections 1-2 times a year which helps out with her left lumbar radicular pain.  It is unclear why this one was not as effective in the context of her having previous   therapeutic ones over the last 2 to 3 years.  At this point, I recommend repeating left L5-S1 ESI to see if that provides analgesic benefit.  If it does not, recommend exploring SI joint and piriformis pathology or considering repeat imaging.  Patient has not sustained a fall or had any significant change in her medical history.  She is only had 1 epidural this year which was done 01/23/2022.     Laboratory Chemistry Profile   Renal Lab Results  Component Value Date   BUN 26 (H) 05/05/2021   CREATININE 0.78 05/05/2021   GFRAA >60 09/17/2019   GFRNONAA >60 05/05/2021    Hepatic Lab Results  Component Value Date   AST 16 01/19/2014   ALT 25 01/19/2014   ALBUMIN 3.6 01/19/2014   ALKPHOS 62 01/19/2014    Electrolytes Lab Results  Component Value Date   NA 138 05/05/2021   K 3.7 05/05/2021   CL 107 05/05/2021   CALCIUM 9.1 05/05/2021    Bone No results found for: "VD25OH", "VD125OH2TOT", "PT4656CL2", "XN1700FV4", "25OHVITD1", "25OHVITD2", "25OHVITD3", "TESTOFREE", "TESTOSTERONE"  Inflammation (CRP: Acute Phase) (ESR: Chronic Phase) No results found for: "CRP", "ESRSEDRATE", "LATICACIDVEN"       Note: Above Lab results reviewed.   Assessment  The primary encounter diagnosis was Lumbar radiculopathy. Diagnoses of Lumbar foraminal stenosis and Chronic pain syndrome were also pertinent to this visit.  Plan of Care  1. Lumbar radiculopathy - Lumbar Epidural Injection; Future  2. Lumbar foraminal stenosis - Lumbar Epidural Injection; Future  3. Chronic pain syndrome - Lumbar Epidural Injection; Future    Orders:  Orders Placed This Encounter  Procedures   Lumbar Epidural  Injection    Standing Status:   Future    Standing Expiration Date:   05/29/2022    Scheduling Instructions:     Procedure: Interlaminar Lumbar Epidural Steroid injection (LESI)            Laterality: LEFT L5/S1     Sedation:without     Timeframe: ASAA    Order Specific Question:   Where will this procedure be performed?    Answer:   [facility]   Follow-up plan:   Return in about 2 weeks (around 03/13/2022) for Left L5-S1 ESI, in clinic [facility].     Previous lumbar epidurals have been very helpful in managing her lumbar radicular pain every 3 to every 4 months previously done on 01/04/2019.  status post SI joint injection 04/05/2019-not effective, repeat ESI prn.         Recent Visits Date Type Provider Dept  01/23/22 Procedure visit [name], MD [facility]  01/07/22 Office Visit [name], MD [facility]  Showing recent visits within past 90 days and meeting all other requirements Today's Visits Date Type Provider Dept  02/27/22 Office Visit [name], MD [facility]  Showing today's visits and meeting all other requirements Future Appointments No visits were found meeting these conditions. Showing future appointments within next 90 days and meeting all other requirements  I discussed the assessment and treatment plan with the patient. The patient was provided an opportunity to ask questions and all were answered. The patient agreed with the plan and demonstrated an understanding of the instructions.  Patient advised to call back or seek an in-person evaluation if the symptoms or condition worsens.  Duration of encounter: 91mnutes.  Note by: BGillis Santa MD Date: 02/27/2022; Time: 3:22 PM

## 2022-03-04 ENCOUNTER — Ambulatory Visit: Payer: Medicare Other | Admitting: Student in an Organized Health Care Education/Training Program

## 2022-05-28 ENCOUNTER — Emergency Department
Admission: EM | Admit: 2022-05-28 | Discharge: 2022-05-28 | Disposition: A | Discharge status: Home / Self Care | Payer: Medicare Other | Attending: Emergency Medicine | Admitting: Emergency Medicine

## 2022-05-28 ENCOUNTER — Other Ambulatory Visit: Payer: Self-pay

## 2022-05-28 ENCOUNTER — Emergency Department: Payer: Medicare Other

## 2022-05-28 DIAGNOSIS — R8289 Other abnormal findings on cytological and histological examination of urine: Secondary | ICD-10-CM | POA: Insufficient documentation

## 2022-05-28 DIAGNOSIS — R42 Dizziness and giddiness: Secondary | ICD-10-CM | POA: Diagnosis not present

## 2022-05-28 DIAGNOSIS — R2681 Unsteadiness on feet: Secondary | ICD-10-CM | POA: Insufficient documentation

## 2022-05-28 DIAGNOSIS — H9202 Otalgia, left ear: Secondary | ICD-10-CM | POA: Diagnosis not present

## 2022-05-28 DIAGNOSIS — I10 Essential (primary) hypertension: Secondary | ICD-10-CM | POA: Insufficient documentation

## 2022-05-28 DIAGNOSIS — E039 Hypothyroidism, unspecified: Secondary | ICD-10-CM | POA: Insufficient documentation

## 2022-05-28 LAB — CBC WITH DIFFERENTIAL/PLATELET
Abs Immature Granulocytes: 0.02 10*3/uL (ref 0.00–0.07)
Basophils Absolute: 0.1 10*3/uL (ref 0.0–0.1)
Basophils Relative: 1 %
Eosinophils Absolute: 0.1 10*3/uL (ref 0.0–0.5)
Eosinophils Relative: 2 %
HCT: 38.9 % (ref 36.0–46.0)
Hemoglobin: 12.4 g/dL (ref 12.0–15.0)
Immature Granulocytes: 0 %
Lymphocytes Relative: 38 %
Lymphs Abs: 2.5 10*3/uL (ref 0.7–4.0)
MCH: 32 pg (ref 26.0–34.0)
MCHC: 31.9 g/dL (ref 30.0–36.0)
MCV: 100.5 fL — ABNORMAL HIGH (ref 80.0–100.0)
Monocytes Absolute: 0.5 10*3/uL (ref 0.1–1.0)
Monocytes Relative: 7 %
Neutro Abs: 3.3 10*3/uL (ref 1.7–7.7)
Neutrophils Relative %: 52 %
Platelets: 181 10*3/uL (ref 150–400)
RBC: 3.87 MIL/uL (ref 3.87–5.11)
RDW: 14.2 % (ref 11.5–15.5)
WBC: 6.4 10*3/uL (ref 4.0–10.5)
nRBC: 0 % (ref 0.0–0.2)

## 2022-05-28 LAB — URINALYSIS, ROUTINE W REFLEX MICROSCOPIC
Bilirubin Urine: NEGATIVE
Glucose, UA: NEGATIVE mg/dL
Hgb urine dipstick: NEGATIVE
Ketones, ur: NEGATIVE mg/dL
Nitrite: NEGATIVE
Protein, ur: NEGATIVE mg/dL
Specific Gravity, Urine: 1.029 (ref 1.005–1.030)
pH: 5 (ref 5.0–8.0)

## 2022-05-28 LAB — BASIC METABOLIC PANEL
Anion gap: 6 (ref 5–15)
BUN: 20 mg/dL (ref 8–23)
CO2: 23 mmol/L (ref 22–32)
Calcium: 9.1 mg/dL (ref 8.9–10.3)
Chloride: 109 mmol/L (ref 98–111)
Creatinine, Ser: 0.67 mg/dL (ref 0.44–1.00)
GFR, Estimated: >60 mL/min (ref >60)
Glucose, Bld: 96 mg/dL (ref 70–99)
Potassium: 3.8 mmol/L (ref 3.5–5.1)
Sodium: 138 mmol/L (ref 135–145)

## 2022-05-28 NOTE — ED Provider Triage Note (Signed)
  Emergency Medicine Provider Triage Evaluation Note  Erika Crawford , a 81 y.o.female,  was evaluated in triage.  Pt complains of dizziness.  She states that she has been having difficulty walking and feels like she is "drunk".  Symptoms started yesterday morning.  She additionally states that she has had some persistent problems with her ears, particular on the left side over the past few months.  Reports some left-sided headache/discomfort.  She states that she is not feeling symptoms when she is  sitting and at rest.   Review of Systems  Positive: Dizziness, headache Negative: Denies fever, chest pain, vomiting  Physical Exam   Vitals:   05/28/22 1403  BP: 137/68  Pulse: 82  Resp: 18  Temp: 98.3 F (36.8 C)  SpO2: 95%   Gen:   Awake, no distress   Resp:  Normal effort  MSK:   Moves extremities without difficulty  Other:    Medical Decision Making  Given the patient's initial medical screening exam, the following diagnostic evaluation has been ordered. The patient will be placed in the appropriate treatment space, once one is available, to complete the evaluation and treatment. I have discussed the plan of care with the patient and I have advised the patient that an ED physician or mid-level practitioner will reevaluate their condition after the test results have been received, as the results may give them additional insight into the type of treatment they may need.    Diagnostics: Labs, EKG, head CT, UA  Treatments: none immediately   Teodoro Spray, [place] 05/28/22 1410

## 2022-05-28 NOTE — Discharge Instructions (Addendum)
-  Please call the neurologist within these instructions (Dr. Manuella Ghazi) to schedule an appointment for follow-up.  You may additionally follow-up with your primary care provider as needed.  -Return to the emergency department anytime if you begin to experience any new or worsening symptoms.

## 2022-05-28 NOTE — ED Triage Notes (Signed)
Reports was at [facility] and sent here because she started having dizziness and gait problem that started yesterday morning.  ALso reports some issues with her ears over the past few days. Most dizziness is when she stands up and walks.

## 2022-05-28 NOTE — ED Provider Notes (Signed)
[facility] Provider Note    Event Date/Time   First MD Initiated Contact with Patient 05/28/22 1603     (approximate)   History   Chief Complaint Dizziness   HPI Erika Crawford is a 81 y.o. female, history of hypertension, hypothyroidism, obstructive sleep apnea, myofascial pain syndrome, presents to the emergency department for reported dizziness/abnormal gait.  Patient states that yesterday she began to experience difficulty with her gait while walking.  She states that she feels "drunk" with wobbly legs when she walks. She states that she attributes this to her arthritis in her legs. Denies any actual dizziness or vertigo.  In addition, she adds that she has been having some issues with left-sided ear pain for the past few months.  Denies any bleeding or discharge.  Denies any tinnitus or hearing loss.  She states that she is unsure if this is related.  Denies fever/chills, chest pain, shortness of breath, abdominal pain, flank pain, nausea/vomiting, diarrhea, urinary symptoms, weakness, vision changes, hearing changes, numbness/tingling in upper or lower extremities, dizziness/lightheadedness, or rash/lesions.  History Limitations: No limitations.        Physical Exam  Triage Vital Signs: ED Triage Vitals  Enc Vitals Group     BP 05/28/22 1403 137/68     Pulse Rate 05/28/22 1403 82     Resp 05/28/22 1403 18     Temp 05/28/22 1403 98.3 F (36.8 C)     Temp Source 05/28/22 1403 Oral     SpO2 05/28/22 1403 95 %     Weight 05/28/22 1404 204 lb (92.5 kg)     Height 05/28/22 1404 '5\' 7"'$  (1.702 m)     Head Circumference --      Peak Flow --      Pain Score 05/28/22 1404 0     Pain Loc --      Pain Edu? --      Excl. in [place]? --     Most recent vital signs: Vitals:   05/28/22 1403  BP: 137/68  Pulse: 82  Resp: 18  Temp: 98.3 F (36.8 C)  SpO2: 95%    General: Awake, NAD.  Skin: Warm, dry. No rashes or lesions.  Eyes: PERRL. Conjunctivae  normal.  CV: Good peripheral perfusion.  Resp: Normal effort.  Abd: Soft, non-tender. No distention.  Neuro: At baseline. No gross neurological deficits.  Cranial nerves II through XII intact.  5/5 strength in both upper and lower extremities.  Sensation intact in all extremities.  Normal DTRs.  She is able to ambulate well on her own and can walk in a straight line.  HINTS exam unremarkable.  No nystagmus elicited with Dix-Hallpike maneuver. Musculoskeletal: Normal ROM of all extremities.  Focused Exam: Ear exam is unremarkable bilaterally.  Normal TMs.  No erythema or bulging.  No active bleeding or discharge.   Physical Exam    ED Results / Procedures / Treatments  Labs (all labs ordered are listed, but only abnormal results are displayed) Labs Reviewed  CBC WITH DIFFERENTIAL/PLATELET - Abnormal; Notable for the following components:      Result Value   MCV 100.5 (*)    All other components within normal limits  URINALYSIS, ROUTINE W REFLEX MICROSCOPIC - Abnormal; Notable for the following components:   Color, Urine YELLOW (*)    APPearance HAZY (*)    Leukocytes,Ua TRACE (*)    Bacteria, UA RARE (*)    All other components within normal limits  BASIC  METABOLIC PANEL     EKG Sinus rhythm, rate of 81, no ST segment changes, normal QRS, no QT prolongation, no axis deviations.    RADIOLOGY  ED Provider Interpretation: I personally reviewed and interpreted this CT scan, no evidence of acute intracranial abnormalities.  CT Head Wo Contrast  Result Date: 05/28/2022 CLINICAL DATA:  Left-sided headache EXAM: CT HEAD WITHOUT CONTRAST TECHNIQUE: Contiguous axial images were obtained from the base of the skull through the vertex without intravenous contrast. RADIATION DOSE REDUCTION: This exam was performed according to the departmental dose-optimization program which includes automated exposure control, adjustment of the mA and/or kV according to patient size and/or use of  iterative reconstruction technique. COMPARISON:  CT Head 05/05/21 FINDINGS: Brain: No evidence of acute infarction, hemorrhage, hydrocephalus, extra-axial collection or mass lesion/mass effect. Incidentally noted is an enlarged and partially empty sella. Vascular: No hyperdense vessel or unexpected calcification. Skull: Normal. Negative for fracture or focal lesion. Sinuses/Orbits: No acute finding. Trace mucosal thickening in the left sphenoid sinus and bilateral posterior ethmoid air cells. No mastoid effusions. Orbits are otherwise unremarkable. Other: None. IMPRESSION: No specific etiology for headaches identified. Electronically Signed   By: Marin Roberts M.D.   On: 05/28/2022 15:04    PROCEDURES:  Critical Care performed: N/A.  Procedures    MEDICATIONS ORDERED IN ED: Medications - No data to display   IMPRESSION / MDM / Leon / ED COURSE  I reviewed the triage vital signs and the nursing notes.                              Differential diagnosis includes, but is not limited to, BPPV, vestibular migraine, vestibular neuritis, labyrinthitis, Mnire disease, posterior stroke, vertebral artery dissection, arrhythmias  ED Course Patient appears well, vitals within normal limits.  In NAD.  CBC shows no leukocytosis or anemia.  BMP shows no electrolyte abnormalities or AKI.  Urinalysis shows trace leukocytes and rare bacteria.  Negative nitrites.  In the absence of urinary symptoms, unlikely urinary tract infection.  Assessment/Plan Patient presents for dizziness/unsteady gait x 1 day.  On exam, I do not see any neurological deficits or any decrease strength/sensation in her lower extremities.  She is able to ambulate on her own and walk in a straight line, despite reporting some difficulties.Marland Kitchen  Her head CT does not show any acute intracranial abnormalities.  Lab workup is reassuring.  No evidence of arrhythmias or significant infections.  She appears well clinically.   Ear exam is unremarkable, unsure of the etiology of her pain.  Her history is not suggestive of vestibular neuritis, labyrinthitis, or Mnire disease,.  No nystagmus to suggest BPPV.  Very low suspicion for posterior stroke or vertebral artery dissection given her history and physical exam.  I do not believe any further advanced imaging is indicated at this time.  Unsure of the exact etiology of the patient's symptoms, but I have a very low suspicion for any serious life-threatening pathology at this time.  Will provide her with a referral to neurology for further evaluation.  Considered admission for this patient, but given her stable presentation and unremarkable workup, she will likely benefit from admission.  Provided the patient with anticipatory guidance, return precautions, and educational material. Encouraged the patient to return to the emergency department at any time if they begin to experience any new or worsening symptoms. Patient expressed understanding and agreed with the plan.   Patient's presentation  is most consistent with acute complicated illness / injury requiring diagnostic workup.       FINAL CLINICAL IMPRESSION(S) / ED DIAGNOSES   Final diagnoses:  Unsteady gait when walking     Rx / DC Orders   ED Discharge Orders     None        Note:  This document was prepared using Dragon voice recognition software and may include unintentional dictation errors.   Teodoro Spray, [place] 05/28/22 Dorthula Perfect    Naaman Plummer, MD 05/28/22 [phone]

## 2022-05-28 NOTE — ED Triage Notes (Signed)
First Nurse Note:  Pt via [facility] from [facility]. Pt complains of staggering gait since yesterday. Denies any other stroke-like symptoms. Pt also has had an ear issues for the last couple of days.

## 2022-08-23 ENCOUNTER — Other Ambulatory Visit: Payer: Self-pay | Admitting: Internal Medicine

## 2022-08-23 DIAGNOSIS — Z1231 Encounter for screening mammogram for malignant neoplasm of breast: Secondary | ICD-10-CM

## 2022-10-04 ENCOUNTER — Ambulatory Visit
Admission: RE | Admit: 2022-10-04 | Discharge: 2022-10-04 | Disposition: A | Discharge status: Home / Self Care | Payer: 59 | Source: Ambulatory Visit | Attending: Internal Medicine | Admitting: Internal Medicine

## 2022-10-04 DIAGNOSIS — Z1231 Encounter for screening mammogram for malignant neoplasm of breast: Secondary | ICD-10-CM | POA: Diagnosis present

## 2023-02-06 ENCOUNTER — Other Ambulatory Visit: Payer: Self-pay | Admitting: Internal Medicine

## 2023-02-06 DIAGNOSIS — R1314 Dysphagia, pharyngoesophageal phase: Secondary | ICD-10-CM

## 2023-02-11 ENCOUNTER — Ambulatory Visit: Payer: 59

## 2023-02-12 ENCOUNTER — Ambulatory Visit
Admission: RE | Admit: 2023-02-12 | Discharge: 2023-02-12 | Disposition: A | Discharge status: Home / Self Care | Payer: 59 | Source: Ambulatory Visit | Attending: Internal Medicine | Admitting: Internal Medicine

## 2023-02-12 DIAGNOSIS — R1314 Dysphagia, pharyngoesophageal phase: Secondary | ICD-10-CM | POA: Diagnosis present

## 2023-03-04 ENCOUNTER — Encounter: Payer: Self-pay | Admitting: Student in an Organized Health Care Education/Training Program

## 2023-03-04 ENCOUNTER — Ambulatory Visit
Payer: 59 | Attending: Student in an Organized Health Care Education/Training Program | Admitting: Student in an Organized Health Care Education/Training Program

## 2023-03-04 VITALS — BP 166/88 | HR 86 | Temp 97.4°F | Ht 67.0 in | Wt 203.0 lb

## 2023-03-04 DIAGNOSIS — M5416 Radiculopathy, lumbar region: Secondary | ICD-10-CM | POA: Insufficient documentation

## 2023-03-04 DIAGNOSIS — G894 Chronic pain syndrome: Secondary | ICD-10-CM | POA: Insufficient documentation

## 2023-03-04 DIAGNOSIS — M48061 Spinal stenosis, lumbar region without neurogenic claudication: Secondary | ICD-10-CM | POA: Insufficient documentation

## 2023-03-04 NOTE — Patient Instructions (Signed)
Procedure instructions  Do not eat or drink fluids (other than water) for 6 hours before your procedure  No water for 2 hours before your procedure  Take your blood pressure medicine with a sip of water  Arrive 30 minutes before your appointment  Carefully read the "Preparing for your procedure" detailed instructions  If you have questions call us at [phone]  _____________________________________________________________________    ______________________________________________________________________  Preparing for your procedure  Appointments: If you think you may not be able to keep your appointment, call 24-48 hours in advance to cancel. We need time to make it available to others.  During your procedure appointment there will be: No Prescription Refills. No disability issues to discussed. No medication changes or discussions.  Instructions: Food intake: Avoid eating anything solid for at least 8 hours prior to your procedure. Clear liquid intake: You may take clear liquids such as water up to 2 hours prior to your procedure. (No carbonated drinks. No soda.) Transportation: Unless otherwise stated by your physician, bring a driver. Morning Medicines: Except for blood thinners, take all of your other morning medications with a sip of water. Make sure to take your heart and blood pressure medicines. If your blood pressure's lower number is above 100, the case will be rescheduled. Blood thinners: Make sure to stop your blood thinners as instructed.  If you take a blood thinner, but were not instructed to stop it, call our office [phone] and ask to talk to a nurse. Not stopping a blood thinner prior to certain procedures could lead to serious complications. Diabetics on insulin: Notify the staff so that you can be scheduled 1st case in the morning. If your diabetes requires high dose insulin, take only  of your normal insulin dose the morning of the procedure and  notify the staff that you have done so. Preventing infections: Shower with an antibacterial soap the morning of your procedure.  Build-up your immune system: Take 1000 mg of Vitamin C with every meal (3 times a day) the day prior to your procedure. Antibiotics: Inform the nursing staff if you are taking any antibiotics or if you have any conditions that may require antibiotics prior to procedures. (Example: recent joint implants)   Pregnancy: If you are pregnant make sure to notify the nursing staff. Not doing so may result in injury to the fetus, including death.  Sickness: If you have a cold, fever, or any active infections, call and cancel or reschedule your procedure. Receiving steroids while having an infection may result in complications. Arrival: You must be in the facility at least 30 minutes prior to your scheduled procedure. Tardiness: Your scheduled time is also the cutoff time. If you do not arrive at least 15 minutes prior to your procedure, you will be rescheduled.  Children: Do not bring any children with you. Make arrangements to keep them home. Dress appropriately: There is always a possibility that your clothing may get soiled. Avoid long dresses. Valuables: Do not bring any jewelry or valuables.  Reasons to call and reschedule or cancel your procedure: (Following these recommendations will minimize the risk of a serious complication.) Surgeries: Avoid having procedures within 2 weeks of any surgery. (Avoid for 2 weeks before or after any surgery). Flu Shots: Avoid having procedures within 2 weeks of a flu shots or . (Avoid for 2 weeks before or after immunizations). Barium: Avoid having a procedure within 7-10 days after having had a radiological study involving the use of radiological contrast. (  Myelograms, Barium swallow or enema study). Heart attacks: Avoid any elective procedures or surgeries for the initial 6 months after a "Myocardial Infarction" (Heart Attack). Blood  thinners: It is imperative that you stop these medications before procedures. Let us know if you if you take any blood thinner.  Infection: Avoid procedures during or within two weeks of an infection (including chest colds or gastrointestinal problems). Symptoms associated with infections include: Localized redness, fever, chills, night sweats or profuse sweating, burning sensation when voiding, cough, congestion, stuffiness, runny nose, sore throat, diarrhea, nausea, vomiting, cold or Flu symptoms, recent or current infections. It is specially important if the infection is over the area that we intend to treat. Heart and lung problems: Symptoms that may suggest an active cardiopulmonary problem include: cough, chest pain, breathing difficulties or shortness of breath, dizziness, ankle swelling, uncontrolled high or unusually low blood pressure, and/or palpitations. If you are experiencing any of these symptoms, cancel your procedure and contact your primary care physician for an evaluation.  Remember:  Regular Business hours are:  Monday to Thursday 8:00 AM to 4:00 PM  Provider's Schedule: Delano Metz, MD:  Procedure days: Tuesday and Thursday 7:30 AM to 4:00 PM  Edward Jolly, MD:  Procedure days: Monday and Wednesday 7:30 AM to 4:00 PM  Epidural Steroid Injection Patient Information  Description: The epidural space surrounds the nerves as they exit the spinal cord.  In some patients, the nerves can be compressed and inflamed by a bulging disc or a tight spinal canal (spinal stenosis).  By injecting steroids into the epidural space, we can bring irritated nerves into direct contact with a potentially helpful medication.  These steroids act directly on the irritated nerves and can reduce swelling and inflammation which often leads to decreased pain.  Epidural steroids may be injected anywhere along the spine and from the neck to the low back depending upon the location of your pain.   After  numbing the skin with local anesthetic (like Novocaine), a small needle is passed into the epidural space slowly.  You may experience a sensation of pressure while this is being done.  The entire block usually last less than 10 minutes.  Conditions which may be treated by epidural steroids:  Low back and leg pain Neck and arm pain Spinal stenosis Post-laminectomy syndrome Herpes zoster (shingles) pain Pain from compression fractures  Preparation for the injection:  Do not eat any solid food or dairy products within 8 hours of your appointment.  You may drink clear liquids up to 3 hours before appointment.  Clear liquids include water, black coffee, juice or soda.  No milk or cream please. You may take your regular medication, including pain medications, with a sip of water before your appointment  Diabetics should hold regular insulin (if taken separately) and take 1/2 normal NPH dos the morning of the procedure.  Carry some sugar containing items with you to your appointment. A driver must accompany you and be prepared to drive you home after your procedure.  Bring all your current medications with your. An IV may be inserted and sedation may be given at the discretion of the physician.   A blood pressure cuff, EKG and other monitors will often be applied during the procedure.  Some patients may need to have extra oxygen administered for a short period. You will be asked to provide medical information, including your allergies, prior to the procedure.  We must know immediately if you are taking blood thinners (like  Coumadin/Warfarin)  Or if you are allergic to IV iodine contrast (dye). We must know if you could possible be pregnant.  Possible side-effects: Bleeding from needle site Infection (rare, may require surgery) Nerve injury (rare) Numbness & tingling (temporary) Difficulty urinating (rare, temporary) Spinal headache ( a headache worse with upright posture) Light -headedness  (temporary) Pain at injection site (several days) Decreased blood pressure (temporary) Weakness in arm/leg (temporary) Pressure sensation in back/neck (temporary)  Call if you experience: Fever/chills associated with headache or increased back/neck pain. Headache worsened by an upright position. New onset weakness or numbness of an extremity below the injection site Hives or difficulty breathing (go to the emergency room) Inflammation or drainage at the infection site Severe back/neck pain Any new symptoms which are concerning to you  Please note:  Although the local anesthetic injected can often make your back or neck feel good for several hours after the injection, the pain will likely return.  It takes 3-7 days for steroids to work in the epidural space.  You may not notice any pain relief for at least that one week.  If effective, we will often do a series of three injections spaced 3-6 weeks apart to maximally decrease your pain.  After the initial series, we generally will wait several months before considering a repeat injection of the same type.  If you have any questions, please call [phone] [facility]

## 2023-03-04 NOTE — Progress Notes (Signed)
PROVIDER NOTE: Information contained herein reflects review and annotations entered in association with encounter. Interpretation of such information and data should be left to medically-trained personnel. Information provided to patient can be located elsewhere in the medical record under "Patient Instructions". Document created using STT-dictation technology, any transcriptional errors that may result from process are unintentional.    Patient: Erika Crawford  Service Category: E/M  Provider: Edward Jolly, MD  DOB: 03-15-1941  DOS: 03/04/2023  Specialty: Interventional Pain Management  MRN: [id number]  Setting: Ambulatory outpatient  PCP: Enid Baas, MD  Type: Established Patient    Referring Provider: Enid Baas, MD  Location: Office  Delivery: Face-to-face     HPI  Erika Crawford, a 82 y.o. year old female, is here today because of her Lumbar radiculopathy [M54.16]. Ms. Ghattas primary complain today is Back Pain  Last encounter: My last encounter with her was on 01/07/22 Pertinent problems: Ms. Isherwood has Lumbar radiculopathy; Spinal stenosis of lumbosacral region; Lumbar degenerative disc disease; and Chronic left-sided low back pain with left-sided sciatica on their pertinent problem list. Pain Assessment: Severity of Chronic pain is reported as a 4 /10. Location: Back Right, Left/radiates down the back of both legs to knees. Onset: More than a month ago. Quality: Aching, Discomfort. Timing: Constant. Modifying factor(s): creams and sprays. Vitals:  height is 5\' 7"  (1.702 m) and weight is 203 lb (92.1 kg). Her temperature is 97.4 F (36.3 C) (abnormal). Her blood pressure is 166/88 (abnormal) and her pulse is 86. Her oxygen saturation is 95%.   Reason for encounter: worsening of previously known (established) problem    Ms. Aldana presents today with overall worsening low back pain that radiates into her left>right hip and left>right posterior lateral thigh in a  dermatomal distribution secondary to lumbar radicular pain.  This is gotten worse over the last 2 months.  Her previous lumbar epidural steroid injection was performed August 2023 which was a left L5-S1.  This injection helped decrease her left lumbar radicular pain by approximately 75% for approx 7 months and also improved her ability to ambulate.  Given increased pain that has not been responsive to medications and stretches, recommend repeat lumbar epidural steroid injection.  Risks and benefits reviewed and patient would like to proceed.  ROS  Constitutional: Denies any fever or chills Gastrointestinal: No reported hemesis, hematochezia, vomiting, or acute GI distress Musculoskeletal:  low back and increased left>right hip and leg pain Neurological: No reported episodes of acute onset apraxia, aphasia, dysarthria, agnosia, amnesia, paralysis, loss of coordination, or loss of consciousness  Medication Review  Acetaminophen, HYDROcodone-acetaminophen, Homeopathic Products, Lidocaine, Magnesium, Menthol (Topical Analgesic), Naphazoline-Pheniramine, alendronate, amLODipine, cholecalciferol, cyanocobalamin, cyclobenzaprine, diltiazem, fluticasone, gabapentin, levothyroxine, linaclotide, magnesium gluconate, magnesium oxide, meloxicam, mometasone, olmesartan, omeprazole, ondansetron, pramipexole, predniSONE, senna, senna-docusate, and traZODone  History Review  Allergy: Ms. Hickmott is allergic to zanaflex [tizanidine], celebrex [celecoxib], codeine, etodolac, hydrochlorothiazide, oxycodone, pravastatin, relafen [nabumetone], and tramadol. Drug: Ms. Deegan  reports no history of drug use. Alcohol:  reports current alcohol use. Tobacco:  reports that she has quit smoking. She has never used smokeless tobacco. Social: Ms. Vitrano  reports that she has quit smoking. She has never used smokeless tobacco. She reports current alcohol use. She reports that she does not use drugs. Medical:  has a past medical  history of Arthritis, Complication of anesthesia, Hypertension, Hypothyroidism, Sleep apnea, and Thyroid disease. Surgical: Ms. 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 Family: family history is not on file.  Laboratory Chemistry Profile   Renal Lab Results  Component Value Date   BUN 20 05/28/2022   CREATININE 0.67 05/28/2022   GFRAA >60 09/17/2019   GFRNONAA >60 05/28/2022    Hepatic Lab Results  Component Value Date   AST 16 01/19/2014   ALT 25 01/19/2014   ALBUMIN 3.6 01/19/2014   ALKPHOS 62 01/19/2014    Electrolytes Lab Results  Component Value Date   NA 138 05/28/2022   K 3.8 05/28/2022   CL 109 05/28/2022   CALCIUM 9.1 05/28/2022    Bone No results found for: "VD25OH", "VD125OH2TOT", "ZO1096EA5", "WU9811BJ4", "25OHVITD1", "25OHVITD2", "25OHVITD3", "TESTOFREE", "TESTOSTERONE"  Inflammation (CRP: Acute Phase) (ESR: Chronic Phase) No results found for: "CRP", "ESRSEDRATE", "LATICACIDVEN"       Note: Above Lab results reviewed.  Recent Imaging Review  DG ESOPHAGUS W DOUBLE CM (HD) CLINICAL DATA:  Patient with complaints of dysphagia.  EXAM: ESOPHAGUS/BARIUM SWALLOW/TABLET STUDY  TECHNIQUE: Combined double and single contrast examination was performed using effervescent crystals, high-density barium, and thin liquid barium. This exam was performed by Alwyn Ren, NP, and was supervised and interpreted by Dr. Allena Katz.  FLUOROSCOPY: Radiation Exposure Index (as provided by the fluoroscopic device): 41.30 mGy Kerma  COMPARISON:  None Available.  FINDINGS: Swallowing: Appears normal. No vestibular penetration or aspiration seen.  Pharynx: Unremarkable.  Esophagus: Normal appearance.  Esophageal motility:  Within normal limits.  Hiatal Hernia: None.  Gastroesophageal reflux: None visualized.  Ingested 13mm barium tablet: Passed normally  Other: None.  IMPRESSION: Normal barium swallow.  Electronically Signed   By: Elige Ko M.D.   On: 02/12/2023 15:29  Note: Reviewed        Physical Exam  General appearance: Well nourished, well developed, and well hydrated. In no apparent acute distress Mental status: Alert, oriented x 3 (person, place, & time)       Respiratory: No evidence of acute respiratory distress Eyes: PERLA Vitals: BP (!) 166/88   Pulse 86   Temp (!) 97.4 F (36.3 C)   Ht 5\' 7"  (1.702 m)   Wt 203 lb (92.1 kg)   SpO2 95%   BMI 31.79 kg/m  BMI: Estimated body mass index is 31.79 kg/m as calculated from the following:   Height as of this encounter: 5\' 7"  (1.702 m).   Weight as of this encounter: 203 lb (92.1 kg). Ideal: Ideal body weight: 61.6 kg (135 lb 12.9 oz) Adjusted ideal body weight: 73.8 kg (162 lb 10.9 oz)  Lumbar Spine Area Exam  Skin & Axial Inspection: No masses, redness, or swelling Alignment: Symmetrical Functional ROM: Decreased ROM       Stability: No instability detected Muscle Tone/Strength: Functionally intact. No obvious neuro-muscular anomalies detected. Sensory (Neurological): Dermatomal pain pattern Palpation: No palpable anomalies       Provocative Tests: Hyperextension/rotation test: deferred today       Lumbar quadrant test (Kemp's test): (+) on the left for foraminal stenosis  Gait & Posture Assessment  Ambulation: Unassisted Gait: Antalgic gait (limping) Posture: Difficulty standing up straight, due to pain  Lower Extremity Exam    Side: Right lower extremity  Side: Left lower extremity  Stability: No instability observed          Stability: No instability observed          Skin & Extremity Inspection: Skin color,  temperature, and hair growth are WNL. No peripheral edema or cyanosis. No masses, redness, swelling,  asymmetry, or associated skin lesions. No contractures.  Skin & Extremity Inspection: Skin color, temperature, and hair growth are WNL. No peripheral edema or cyanosis. No masses, redness, swelling, asymmetry, or associated skin lesions. No contractures.  Functional ROM: Unrestricted ROM                  Functional ROM: Decreased ROM                  Muscle Tone/Strength: Functionally intact. No obvious neuro-muscular anomalies detected.  Muscle Tone/Strength: Functionally intact. No obvious neuro-muscular anomalies detected.  Sensory (Neurological): Unimpaired        Sensory (Neurological): Dermatomal pain pattern        DTR: Patellar: deferred today Achilles: deferred today Plantar: deferred today  DTR: Patellar: deferred today Achilles: deferred today Plantar: deferred today  Palpation: No palpable anomalies  Palpation: No palpable anomalies    Assessment   Status Diagnosis  Having a Flare-up Having a Flare-up Controlled 1. Lumbar radiculopathy   2. Lumbar foraminal stenosis   3. Chronic pain syndrome      Updated Problems: No problems updated.   Plan of Care    [name] has a current medication list which includes the following long-term medication(s): gabapentin, levothyroxine, linaclotide, mometasone, olmesartan, omeprazole, pramipexole, diltiazem, diltiazem, gabapentin, levothyroxine, olmesartan, and trazodone.  Orders:  Orders Placed This Encounter  Procedures   Lumbar Epidural Injection    Standing Status:   Future    Standing Expiration Date:   06/03/2023    Scheduling Instructions:     Procedure: Interlaminar Lumbar Epidural Steroid injection (LESI)            Laterality: Left L5/S1     Sedation: local     Timeframe: ASAA    Order Specific Question:   Where will this procedure be performed?    Answer:   [facility]   Follow-up plan:   Return in about 2 weeks (around 03/18/2023) for L5/S1 ESI, in clinic [facility].    Recent Visits No visits  were found meeting these conditions. Showing recent visits within past 90 days and meeting all other requirements Today's Visits Date Type Provider Dept  03/04/23 Office Visit Edward Jolly, MD [facility]  Showing today's visits and meeting all other requirements Future Appointments Date Type Provider Dept  03/19/23 Appointment Edward Jolly, MD [facility]  Showing future appointments within next 90 days and meeting all other requirements  I discussed the assessment and treatment plan with the patient. The patient was provided an opportunity to ask questions and all were answered. The patient agreed with the plan and demonstrated an understanding of the instructions.  Patient advised to call back or seek an in-person evaluation if the symptoms or condition worsens.  Duration of encounter: .  Note by: Edward Jolly, MD Date: 03/04/2023; Time: 3:39 PM

## 2023-03-19 ENCOUNTER — Encounter: Payer: Self-pay | Admitting: Student in an Organized Health Care Education/Training Program

## 2023-03-19 ENCOUNTER — Ambulatory Visit
Payer: 59 | Attending: Student in an Organized Health Care Education/Training Program | Admitting: Student in an Organized Health Care Education/Training Program

## 2023-03-19 ENCOUNTER — Ambulatory Visit
Admission: RE | Admit: 2023-03-19 | Discharge: 2023-03-19 | Disposition: A | Discharge status: Home / Self Care | Payer: 59 | Source: Ambulatory Visit | Attending: Student in an Organized Health Care Education/Training Program | Admitting: Student in an Organized Health Care Education/Training Program

## 2023-03-19 VITALS — BP 176/106 | HR 105 | Temp 97.2°F | Resp 17 | Ht 67.0 in | Wt 205.0 lb

## 2023-03-19 DIAGNOSIS — M48061 Spinal stenosis, lumbar region without neurogenic claudication: Secondary | ICD-10-CM | POA: Insufficient documentation

## 2023-03-19 DIAGNOSIS — M5416 Radiculopathy, lumbar region: Secondary | ICD-10-CM | POA: Insufficient documentation

## 2023-03-19 MED ORDER — IOHEXOL 180 MG/ML  SOLN
INTRAMUSCULAR | Status: AC
  Filled 2023-03-19: qty 20

## 2023-03-19 MED ORDER — LIDOCAINE HCL 2 % IJ SOLN
INTRAMUSCULAR | Status: AC
  Filled 2023-03-19: qty 20

## 2023-03-19 MED ORDER — ROPIVACAINE HCL 2 MG/ML IJ SOLN
INTRAMUSCULAR | Status: AC
  Filled 2023-03-19: qty 20

## 2023-03-19 MED ORDER — ROPIVACAINE HCL 2 MG/ML IJ SOLN
2.0000 mL | Freq: Once | INTRAMUSCULAR | Status: AC
  Administered 2023-03-19: 2 mL via EPIDURAL

## 2023-03-19 MED ORDER — SODIUM CHLORIDE (PF) 0.9 % IJ SOLN
INTRAMUSCULAR | Status: AC
  Filled 2023-03-19: qty 10

## 2023-03-19 MED ORDER — DEXAMETHASONE SODIUM PHOSPHATE 10 MG/ML IJ SOLN
10.0000 mg | Freq: Once | INTRAMUSCULAR | Status: AC
  Administered 2023-03-19: 10 mg

## 2023-03-19 MED ORDER — DEXAMETHASONE SODIUM PHOSPHATE 10 MG/ML IJ SOLN
INTRAMUSCULAR | Status: AC
  Filled 2023-03-19: qty 1

## 2023-03-19 MED ORDER — IOHEXOL 180 MG/ML  SOLN
10.0000 mL | Freq: Once | INTRAMUSCULAR | Status: AC
  Administered 2023-03-19: 10 mL via EPIDURAL

## 2023-03-19 MED ORDER — SODIUM CHLORIDE 0.9% FLUSH
2.0000 mL | Freq: Once | INTRAVENOUS | Status: AC
  Administered 2023-03-19: 2 mL

## 2023-03-19 MED ORDER — LIDOCAINE HCL 2 % IJ SOLN
20.0000 mL | Freq: Once | INTRAMUSCULAR | Status: AC
  Administered 2023-03-19: 400 mg

## 2023-03-19 NOTE — Progress Notes (Signed)
Safety precautions to be maintained throughout the outpatient stay will include: orient to surroundings, keep bed in low position, maintain call bell within reach at all times, provide assistance with transfer out of bed and ambulation.  

## 2023-03-19 NOTE — Progress Notes (Signed)
Patient's Name: Erika Crawford  MRN: [id number]  Referring Provider: Enid Baas, MD  DOB: [birth year]  PCP: Enid Baas, MD  DOS: 03/19/2023  Note by: Edward Jolly, MD  Service setting: Ambulatory outpatient  Specialty: Interventional Pain Management  Patient type: Established  Location: [facility] (AMB) [facility]  Visit type: Interventional Procedure   Primary Reason for Visit: Interventional Pain Management Treatment. CC: Leg Pain (bilat)  Procedure:       Anesthesia, Analgesia, Anxiolysis:  Type: Therapeutic Inter-Laminar Epidural Steroid Injection (previously done 01/23/22 Region: Lumbar Level: L5-S1 Level. Laterality: Left-Sided         Type: Local Anesthesia Indication(s): Analgesia         Route: Infiltration ([place]/IM) IV Access: Declined Sedation: Declined  Local Anesthetic: Lidocaine 1-2%   Indications: 1. Lumbar radiculopathy   2. Lumbar foraminal stenosis    Pain Score: Pre-procedure: 8/10 Post-procedure:4//10  Pre-op Assessment:  Erika Crawford is a 82 y.o. (year old), female patient, seen today for interventional treatment. 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 Erika Crawford has a current medication list which includes the following prescription(s): alendronate, amlodipine, lidocaine, cholecalciferol, cyanocobalamin, cyclobenzaprine, diltiazem, fluticasone, gabapentin, hydrocodone-acetaminophen, hydrocodone-acetaminophen, levothyroxine, magnesium oxide, menthol (topical analgesic), mometasone, naphazoline-pheniramine, olmesartan, omeprazole, senna, cyanocobalamin, acetaminophen, acetaminophen, diltiazem, gabapentin, homeopathic products, levothyroxine, linaclotide, magnesium, magnesium gluconate, meloxicam, menthol  (topical analgesic), olmesartan, ondansetron, pramipexole, prednisone, senna-docusate, and trazodone, and the following Facility-Administered Medications: dexamethasone, iohexol, lidocaine, ropivacaine (pf) 2 mg/ml (0.2%), and sodium chloride flush. Her primarily concern today is the Leg Pain (bilat)   Initial Vital Signs:  Pulse Rate: 90 Temp: (!) 97.2 F (36.2 C) Resp: 16 BP: (!) 150/81 SpO2: 99 %  BMI: Estimated body mass index is 32.11 kg/m as calculated from the following:   Height as of this encounter: 5\' 7"  (1.702 m).   Weight as of this encounter: 205 lb (93 kg).  Risk Assessment: Allergies: Reviewed. She is allergic to zanaflex [tizanidine], celebrex [celecoxib], codeine, etodolac, hydrochlorothiazide, oxycodone, pravastatin, relafen [nabumetone], and tramadol.  Allergy Precautions: None required Coagulopathies: Reviewed. None identified.  Blood-thinner therapy: None at this time Active Infection(s): Reviewed. None identified. Erika Crawford is afebrile  Site Confirmation: Erika Crawford was asked to confirm the procedure and laterality before marking the site Procedure checklist: Completed Consent: Before the procedure and under the influence of no sedative(s), amnesic(s), or anxiolytics, the patient was informed of the treatment options, risks and possible complications. To fulfill our ethical and legal obligations, as recommended by the American Medical Association's Code of Ethics, I have informed the patient of my clinical impression; the nature and purpose of the treatment or procedure; the risks, benefits, and possible complications of the intervention; the alternatives, including doing nothing; the risk(s) and benefit(s) of the alternative treatment(s) or procedure(s); and the risk(s) and benefit(s) of doing nothing. The patient was provided information about the general risks and possible complications associated with the procedure. These may include, but are not limited to:  failure to achieve desired goals, infection, bleeding, organ or nerve damage, allergic reactions, paralysis, and death. In addition, the patient was informed of those risks and complications associated to Spine-related procedures, such as failure to decrease pain; infection (i.e.: Meningitis, epidural or intraspinal abscess); bleeding (i.e.: epidural hematoma, subarachnoid hemorrhage, or any other type of intraspinal or peri-dural bleeding); organ or nerve damage (i.e.:  Any type of peripheral nerve, nerve root, or spinal cord injury) with subsequent damage to sensory, motor, and/or autonomic systems, resulting in permanent pain, numbness, and/or weakness of one or several areas of the body; allergic reactions; (i.e.: anaphylactic reaction); and/or death. Furthermore, the patient was informed of those risks and complications associated with the medications. These include, but are not limited to: allergic reactions (i.e.: anaphylactic or anaphylactoid reaction(s)); adrenal axis suppression; blood sugar elevation that in diabetics may result in ketoacidosis or comma; water retention that in patients with history of congestive heart failure may result in shortness of breath, pulmonary edema, and decompensation with resultant heart failure; weight gain; swelling or edema; medication-induced neural toxicity; particulate matter embolism and blood vessel occlusion with resultant organ, and/or nervous system infarction; and/or aseptic necrosis of one or more joints. Finally, the patient was informed that Medicine is not an exact science; therefore, there is also the possibility of unforeseen or unpredictable risks and/or possible complications that may result in a catastrophic outcome. The patient indicated having understood very clearly. We have given the patient no guarantees and we have made no promises. Enough time was given to the patient to ask questions, all of which were answered to the patient's satisfaction. Ms.  Crawford has indicated that she wanted to continue with the procedure. Attestation: I, the ordering provider, attest that I have discussed with the patient the benefits, risks, side-effects, alternatives, likelihood of achieving goals, and potential problems during recovery for the procedure that I have provided informed consent. Date  Time:   Pre-Procedure Preparation:  Monitoring: As per clinic protocol. Respiration, ETCO2, SpO2, BP, heart rate and rhythm monitor placed and checked for adequate function Safety Precautions: Patient was assessed for positional comfort and pressure points before starting the procedure. Time-out: I initiated and conducted the "Time-out" before starting the procedure, as per protocol. The patient was asked to participate by confirming the accuracy of the "Time Out" information. Verification of the correct person, site, and procedure were performed and confirmed by me, the nursing staff, and the patient. "Time-out" conducted as per Joint Commission's Universal Protocol (UP.01.01.01). Time:    Description of Procedure:       Position: Prone with head of the table was raised to facilitate breathing. Target Area: The interlaminar space, initially targeting the lower laminar border of the superior vertebral body. Approach: Paramedial approach. Area Prepped: Entire Posterior Lumbar Region Prepping solution: ChloraPrep (2% chlorhexidine gluconate and 70% isopropyl alcohol) Safety Precautions: Aspiration looking for blood return was conducted prior to all injections. At no point did we inject any substances, as a needle was being advanced. No attempts were made at seeking any paresthesias. Safe injection practices and needle disposal techniques used. Medications properly checked for expiration dates. SDV (single dose vial) medications used. Description of the Procedure: Protocol guidelines were followed. The procedure needle was introduced through the skin, ipsilateral to the  reported pain, and advanced to the target area. Bone was contacted and the needle walked caudad, until the lamina was cleared. The epidural space was identified using "loss-of-resistance technique" with 2-3 ml of PF-NaCl (0.9% NSS), in a 5cc LOR glass syringe. Vitals:   03/19/23 1247 03/19/23 1249  BP: (!) 150/81 137/84  Pulse: 90   Resp: 16   Temp: (!) 97.2 F (36.2 C)   SpO2: 99%   Weight: 205 lb (93 kg)   Height: 5\' 7"  (1.702 m)     Start Time:   hrs. End Time:   hrs. Materials:  Needle(s) Type: Epidural needle Gauge: 17G Length: 5-in Medication(s): Please see orders for medications and dosing details. 6CC solution made of 3 cc of preservative-free saline, 2 cc of 0.2% ropivacaine, 1 cc of Decadron 10 mg per cc Imaging Guidance (Spinal):  Type of Imaging Technique: Fluoroscopy Guidance (Spinal) Indication(s): Assistance in needle guidance and placement for procedures requiring needle placement in or near specific anatomical locations not easily accessible without such assistance. Exposure Time: Please see nurses notes. Contrast: Before injecting any contrast, we confirmed that the patient did not have an allergy to iodine, shellfish, or radiological contrast. Once satisfactory needle placement was completed at the desired level, radiological contrast was injected. Contrast injected under live fluoroscopy. No contrast complications. See chart for type and volume of contrast used. Fluoroscopic Guidance: I was personally present during the use of fluoroscopy. "Tunnel Vision Technique" used to obtain the best possible view of the target area. Parallax error corrected before commencing the procedure. "Direction-depth-direction" technique used to introduce the needle under continuous pulsed fluoroscopy. Once target was reached, antero-posterior, oblique, and lateral fluoroscopic projection used confirm needle placement in all planes. Images permanently stored in EMR. Interpretation: I  personally interpreted the imaging intraoperatively. Adequate needle placement confirmed in multiple planes. Appropriate spread of contrast into desired area was observed. No evidence of afferent or efferent intravascular uptake. No intrathecal or subarachnoid spread observed. Permanent images saved into the patient's record.  Post-operative Assessment:  Post-procedure Vital Signs:  Pulse Rate: 90 Temp: (!) 97.2 F (36.2 C) Resp: 16 BP: 137/84 SpO2: 99 %  EBL: None  Complications: No immediate post-treatment complications observed by team, or reported by patient.  Note: The patient tolerated the entire procedure well. A repeat set of vitals were taken after the procedure and the patient was kept under observation following institutional policy, for this type of procedure. Post-procedural neurological assessment was performed, showing return to baseline, prior to discharge. The patient was provided with post-procedure discharge instructions, including a section on how to identify potential problems. Should any problems arise concerning this procedure, the patient was given instructions to immediately contact us, at any time, without hesitation. In any case, we plan to contact the patient by telephone for a follow-up status report regarding this interventional procedure.  Comments:  No additional relevant information.  5 out of 5 strength bilateral lower extremity: Plantar flexion, dorsiflexion, knee flexion, knee extension.  Plan of Care   Imaging Orders         DG PAIN CLINIC C-ARM 1-60 MIN NO REPORT     Procedure Orders    No procedure(s) ordered today    Medications ordered for procedure: Meds ordered this encounter  Medications   iohexol (OMNIPAQUE) 180 MG/ML injection 10 mL    Must be Myelogram-compatible. If not available, you may substitute with a water-soluble, non-ionic, hypoallergenic, myelogram-compatible radiological contrast medium.   lidocaine (XYLOCAINE) 2 % (with  pres) injection 400 mg   ropivacaine (PF) 2 mg/mL (0.2%) (NAROPIN) injection 2 mL   sodium chloride flush (NS) 0.9 % injection 2 mL   dexamethasone (DECADRON) injection 10 mg    Medications administered: Dustin Folks had no medications administered during this visit.  See the medical record for exact dosing, route, and time of administration.  New Prescriptions   No medications on file   Disposition: Discharge home  Discharge Date & Time: 03/19/2023;   hrs.   Physician-requested Follow-up: Return in about 5 weeks (around 04/23/2023) for PPE, VV.  Future Appointments  Date  Time Provider Department Center  03/19/2023  1:05 PM [facility]

## 2023-03-19 NOTE — Patient Instructions (Signed)

## 2023-03-20 ENCOUNTER — Telehealth: Payer: Self-pay | Admitting: *Deleted

## 2023-03-20 NOTE — Telephone Encounter (Signed)
No problems post procedure. 

## 2023-04-22 ENCOUNTER — Telehealth: Payer: Self-pay

## 2023-04-22 ENCOUNTER — Encounter: Payer: Self-pay | Admitting: Student in an Organized Health Care Education/Training Program

## 2023-04-22 NOTE — Telephone Encounter (Signed)
Completed call for pre-appointment review of post-procedure evaluation.

## 2023-04-23 ENCOUNTER — Ambulatory Visit
Payer: 59 | Attending: Student in an Organized Health Care Education/Training Program | Admitting: Student in an Organized Health Care Education/Training Program

## 2023-04-23 DIAGNOSIS — M5416 Radiculopathy, lumbar region: Secondary | ICD-10-CM | POA: Diagnosis not present

## 2023-04-23 DIAGNOSIS — G894 Chronic pain syndrome: Secondary | ICD-10-CM

## 2023-04-23 DIAGNOSIS — M48061 Spinal stenosis, lumbar region without neurogenic claudication: Secondary | ICD-10-CM

## 2023-04-23 NOTE — Progress Notes (Signed)
Hey [name] patient: Erika Crawford  Service Category: E/M  Provider: Edward Jolly, MD  DOB: 10-23-1940  DOS: 04/23/2023  Location: Office  MRN: [id number]  Setting: Ambulatory outpatient  Referring Provider: Enid Baas, MD  Type: Established Patient  Specialty: Interventional Pain Management  PCP: Erika Baas, MD  Location: Remote location  Delivery: TeleHealth     Virtual Encounter - Pain Management PROVIDER NOTE: Information contained herein reflects review and annotations entered in association with encounter. Interpretation of such information and data should be left to medically-trained personnel. Information provided to patient can be located elsewhere in the medical record under "Patient Instructions". Document created using STT-dictation technology, any transcriptional errors that may result from process are unintentional.    Contact & Pharmacy Preferred: [phone] Home: [phone] (home) Mobile: [phone] (mobile) E-mail: No e-mail address on record  [id number] - [place], [place] - [address] AT [facility] OF [address] [employer] [address] [place] [address] Phone: [phone] Fax: [phone]   Pre-screening  [name] offered "in-person" vs "virtual" encounter. She indicated preferring virtual for this encounter.   Reason COVID-19*  Social distancing based on CDC and AMA recommendations.   I contacted Erika Crawford on 04/23/2023 via telephone.      I clearly identified myself as Edward Jolly, MD. I verified that I was speaking with the correct person using two identifiers (Name: Erika Crawford, and date of birth: 1940-08-05).  Consent I sought verbal advanced consent from Erika Crawford for virtual visit interactions. I informed [name] of possible security and privacy concerns, risks, and limitations associated with providing "not-in-person" medical evaluation and management services. I also informed [name] of  the availability of "in-person" appointments. Finally, I informed her that there would be a charge for the virtual visit and that she could be  personally, fully or partially, financially responsible for it. [name] expressed understanding and agreed to proceed.   Historic Elements   [name] is a 82 y.o. year old, female patient evaluated today after our last contact on 03/19/2023. [name]  has a past medical history of Arthritis, Complication of anesthesia, Hypertension, Hypothyroidism, Sleep apnea, and Thyroid disease. 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 [name] has a current medication list which includes the following prescription(s): alendronate, amlodipine, lidocaine, cholecalciferol, cyanocobalamin, cyclobenzaprine, diltiazem, diltiazem, fluticasone, gabapentin, gabapentin, homeopathic products, hydrocodone-acetaminophen, hydrocodone-acetaminophen, levothyroxine, linaclotide, magnesium, magnesium gluconate, magnesium oxide, meloxicam, menthol (topical analgesic), menthol (topical analgesic), mometasone, naphazoline-pheniramine, olmesartan, omeprazole, ondansetron, pramipexole, prednisone, senna, senna-docusate, cyanocobalamin, acetaminophen, acetaminophen, levothyroxine, olmesartan, and trazodone. She  reports that she has quit smoking. She has never used smokeless tobacco. She reports current alcohol use. She reports that she does not use drugs. Erika Crawford is allergic to zanaflex [tizanidine], celebrex [celecoxib], codeine, etodolac, hydrochlorothiazide, oxycodone, pravastatin, relafen [nabumetone], and tramadol.  BMI: Estimated body mass index is 32.11 kg/m as calculated from the following:   Height as of 03/19/23: 5\' 7"  (1.702  m).   Weight as of 03/19/23: 205 lb (93 kg). Last encounter: 03/04/2023. Last procedure: 03/19/2023.  HPI  Today, she is being contacted for a post-procedure assessment.   Post-procedure evaluation   Type: Therapeutic Inter-Laminar Epidural Steroid Injection (previously done 01/23/22 Region: Lumbar Level: L5-S1 Level. Laterality: Left-Sided  Effectiveness:  Initial hour after procedure: 100 %  Subsequent 4-6 hours post-procedure: 100 %  Analgesia past initial 6 hours: 100% for 2 weeks with gradual return there after Ongoing improvement:  Analgesic:  40% Function: Somewhat improved ROM: Somewhat improved   Laboratory Chemistry Profile   Renal Lab Results  Component Value Date   BUN 20 05/28/2022   CREATININE 0.67 05/28/2022   GFRAA >60 09/17/2019   GFRNONAA >60 05/28/2022    Hepatic Lab Results  Component Value Date   AST 16 01/19/2014   ALT 25 01/19/2014   ALBUMIN 3.6 01/19/2014   ALKPHOS 62 01/19/2014    Electrolytes Lab Results  Component Value Date   NA 138 05/28/2022   K 3.8 05/28/2022   CL 109 05/28/2022   CALCIUM 9.1 05/28/2022    Bone No results found for: "VD25OH", "VD125OH2TOT", "ZO1096EA5", "WU9811BJ4", "25OHVITD1", "25OHVITD2", "25OHVITD3", "TESTOFREE", "TESTOSTERONE"  Inflammation (CRP: Acute Phase) (ESR: Chronic Phase) No results found for: "CRP", "ESRSEDRATE", "LATICACIDVEN"       Note: Above Lab results reviewed.  Assessment  The primary encounter diagnosis was Lumbar radiculopathy. Diagnoses of Lumbar foraminal stenosis and Chronic pain syndrome were also pertinent to this visit.  Plan of Care   patient states that this last epidural was not as helpful as her previous epidurals which have provided her with 60 to 70% pain relief for over 4-6 months.  We discussed repeating epidural in 3 weeks and patient is in agreement with plan.  Orders:  Orders Placed This Encounter  Procedures   Lumbar Epidural Injection    Standing Status:   Future     Standing Expiration Date:   07/24/2023    Scheduling Instructions:     Procedure: Interlaminar Lumbar Epidural Steroid injection (LESI)            Laterality: LEFT L5/S1     Sedation: without     Timeframe: ASAA    Order Specific Question:   Where will this procedure be performed?    Answer:   [facility]   Follow-up plan:   Return in about 26 days (around 05/19/2023) for Left L5/S1 ESI, in clinic NS.      Previous lumbar epidurals have been very helpful in managing her lumbar radicular pain every 3 to every 4 months previously done on 01/04/2019.  status post SI joint injection 04/05/2019-not effective, repeat ESI prn.           Recent Visits Date Type Provider Dept  03/19/23 Procedure visit Edward Jolly, MD [facility]  03/04/23 Office Visit Edward Jolly, MD [facility]  Showing recent visits within past 90 days and meeting all other requirements Today's Visits Date Type Provider Dept  04/23/23 Office Visit Edward Jolly, MD [facility]  Showing today's visits and meeting all other requirements Future Appointments No visits were found meeting these conditions. Showing future appointments within next 90 days and meeting all other requirements  I discussed the assessment and treatment plan with the patient. The patient was provided an opportunity to ask questions and all were answered. The patient agreed with the plan and demonstrated an understanding of the instructions.  Patient advised to call back or seek an in-person evaluation if the symptoms or condition worsens.  Duration of encounter: .  Note by: Edward Jolly, MD Date: 04/23/2023; Time: 4:12 PM

## 2023-04-24 NOTE — Patient Instructions (Signed)
 ______________________________________________________________________    Preparing for your procedure  Appointments: If you think you may not be able to keep your appointment, call 24-48 hours in advance to cancel. We need time to make it available to others.  During your procedure appointment there will be: No Prescription Refills. No disability issues to discussed. No medication changes or discussions.  Instructions: Food intake: Avoid eating anything solid for at least 8 hours prior to your procedure. Clear liquid intake: You may take clear liquids such as water up to 2 hours prior to your procedure. (No carbonated drinks. No soda.) Transportation: Unless otherwise stated by your physician, bring a driver. (Driver cannot be a Market researcher, Pharmacist, community, or any other form of public transportation.) Morning Medicines: Except for blood thinners, take all of your other morning medications with a sip of water. Make sure to take your heart and blood pressure medicines. If your blood pressure's lower number is above 100, the case will be rescheduled. Blood thinners: Make sure to stop your blood thinners as instructed.  If you take a blood thinner, but were not instructed to stop it, call our office [phone] and ask to talk to a nurse. Not stopping a blood thinner prior to certain procedures could lead to serious complications. Diabetics on insulin: Notify the staff so that you can be scheduled 1st case in the morning. If your diabetes requires high dose insulin, take only  of your normal insulin dose the morning of the procedure and notify the staff that you have done so. Preventing infections: Shower with an antibacterial soap the morning of your procedure.  Build-up your immune system: Take 1000 mg of Vitamin C with every meal (3 times a day) the day prior to your procedure. Antibiotics: Inform the nursing staff if you are taking any antibiotics or if you have any conditions that may require antibiotics  prior to procedures. (Example: recent joint implants)   Pregnancy: If you are pregnant make sure to notify the nursing staff. Not doing so may result in injury to the fetus, including death.  Sickness: If you have a cold, fever, or any active infections, call and cancel or reschedule your procedure. Receiving steroids while having an infection may result in complications. Arrival: You must be in the facility at least 30 minutes prior to your scheduled procedure. Tardiness: Your scheduled time is also the cutoff time. If you do not arrive at least 15 minutes prior to your procedure, you will be rescheduled.  Children: Do not bring any children with you. Make arrangements to keep them home. Dress appropriately: There is always a possibility that your clothing may get soiled. Avoid long dresses. Valuables: Do not bring any jewelry or valuables.  Reasons to call and reschedule or cancel your procedure: (Following these recommendations will minimize the risk of a serious complication.) Surgeries: Avoid having procedures within 2 weeks of any surgery. (Avoid for 2 weeks before or after any surgery). Flu Shots: Avoid having procedures within 2 weeks of a flu shots or . (Avoid for 2 weeks before or after immunizations). Barium: Avoid having a procedure within 7-10 days after having had a radiological study involving the use of radiological contrast. (Myelograms, Barium swallow or enema study). Heart attacks: Avoid any elective procedures or surgeries for the initial 6 months after a "Myocardial Infarction" (Heart Attack). Blood thinners: It is imperative that you stop these medications before procedures. Let us know if you if you take any blood thinner.  Infection: Avoid procedures during or within  two weeks of an infection (including chest colds or gastrointestinal problems). Symptoms associated with infections include: Localized redness, fever, chills, night sweats or profuse sweating, burning sensation  when voiding, cough, congestion, stuffiness, runny nose, sore throat, diarrhea, nausea, vomiting, cold or Flu symptoms, recent or current infections. It is specially important if the infection is over the area that we intend to treat. Heart and lung problems: Symptoms that may suggest an active cardiopulmonary problem include: cough, chest pain, breathing difficulties or shortness of breath, dizziness, ankle swelling, uncontrolled high or unusually low blood pressure, and/or palpitations. If you are experiencing any of these symptoms, cancel your procedure and contact your primary care physician for an evaluation.  Remember:  Regular Business hours are:  Monday to Thursday 8:00 AM to 4:00 PM  Provider's Schedule: Delano Metz, MD:  Procedure days: Tuesday and Thursday 7:30 AM to 4:00 PM  Edward Jolly, MD:  Procedure days: Monday and Wednesday 7:30 AM to 4:00 PM Last  Updated: 02/04/2023 ______________________________________________________________________

## 2023-08-26 ENCOUNTER — Telehealth: Payer: Self-pay | Admitting: Student in an Organized Health Care Education/Training Program

## 2023-08-27 ENCOUNTER — Other Ambulatory Visit: Payer: Self-pay | Admitting: Internal Medicine

## 2023-08-27 DIAGNOSIS — Z1231 Encounter for screening mammogram for malignant neoplasm of breast: Secondary | ICD-10-CM

## 2023-09-02 ENCOUNTER — Ambulatory Visit
Attending: Student in an Organized Health Care Education/Training Program | Admitting: Student in an Organized Health Care Education/Training Program

## 2023-09-02 ENCOUNTER — Encounter: Payer: Self-pay | Admitting: Student in an Organized Health Care Education/Training Program

## 2023-09-02 VITALS — BP 123/58 | Temp 97.3°F | Resp 14 | Ht 67.0 in | Wt 197.0 lb

## 2023-09-02 DIAGNOSIS — M48061 Spinal stenosis, lumbar region without neurogenic claudication: Secondary | ICD-10-CM | POA: Diagnosis not present

## 2023-09-02 DIAGNOSIS — M5416 Radiculopathy, lumbar region: Secondary | ICD-10-CM | POA: Diagnosis not present

## 2023-09-02 DIAGNOSIS — G894 Chronic pain syndrome: Secondary | ICD-10-CM | POA: Diagnosis not present

## 2023-09-02 NOTE — Progress Notes (Signed)
 PROVIDER NOTE: Information contained herein reflects review and annotations entered in association with encounter. Interpretation of such information and data should be left to medically-trained personnel. Information provided to patient can be located elsewhere in the medical record under "Patient Instructions". Document created using STT-dictation technology, any transcriptional errors that may result from process are unintentional.    Patient: Erika Crawford  Service Category: E/M  Provider: Edward Jolly, MD  DOB: 14-Apr-1941  DOS: 09/02/2023  Specialty: Interventional Pain Management  MRN: [id number]  Setting: Ambulatory outpatient  PCP: Enid Baas, MD  Type: Established Patient    Referring Provider: Enid Baas, MD  Location: Office  Delivery: Face-to-face     HPI  Erika Crawford, a 83 y.o. year old female, is here today because of her Lumbar radiculopathy [M54.16]. Erika Crawford primary complain today is Leg Pain (Posterior, bilateral upper legs) and Back Pain (Left, lower)  Last encounter: My last encounter with her was on 04/23/2023. Pertinent problems: Erika Crawford has Lumbar radiculopathy; Spinal stenosis of lumbosacral region; Lumbar degenerative disc disease; and Chronic left-sided low back pain with left-sided sciatica on their pertinent problem list. Pain Assessment: Severity of Chronic pain is reported as a 9 /10. Location: Leg Right, Left, Posterior, Upper/ . Onset: More than a month ago. Quality: Sharp. Timing: Intermittent. Modifying factor(s): topicals, medications. Vitals:  height is 5\' 7"  (1.702 m) and weight is 197 lb (89.4 kg). Her temporal temperature is 97.3 F (36.3 C) (abnormal). Her blood pressure is 123/58 (abnormal). Her respiration is 14 and oxygen saturation is 100%.   Reason for encounter: worsening of previously known (established) problem    Erika Crawford presents today with overall worsening low back pain that radiates into her left>right hip and  left>right posterior lateral thigh in a dermatomal distribution secondary to lumbar radicular pain.  This is gotten worse over the last couple of weeks.  Her previous lumbar epidural steroid injection was performed March 19, 2023 which was a left L5-S1.  This injection helped decrease her left lumbar radicular pain by approximately 75% and also improved her ability to ambulate.  Given increased pain that has not been responsive to medications and stretches, recommend repeat lumbar epidural steroid injection.  Risks and benefits reviewed and patient would like to proceed.  ROS  Constitutional: Denies any fever or chills Gastrointestinal: No reported hemesis, hematochezia, vomiting, or acute GI distress Musculoskeletal:  low back and increased left>right hip and leg pain Neurological: No reported episodes of acute onset apraxia, aphasia, dysarthria, agnosia, amnesia, paralysis, loss of coordination, or loss of consciousness  Medication Review  HYDROcodone-acetaminophen, Homeopathic Products, Lidocaine, Magnesium, Menthol (Topical Analgesic), Naphazoline-Pheniramine, amLODipine, cholecalciferol, cyanocobalamin, cyclobenzaprine, diltiazem, fluticasone, gabapentin, levothyroxine, linaclotide, magnesium gluconate, magnesium oxide, mometasone, olmesartan, omeprazole, ondansetron, senna, and senna-docusate  History Review  Allergy: Erika Crawford is allergic to zanaflex [tizanidine], celebrex [celecoxib], codeine, etodolac, hydrochlorothiazide, oxycodone, pravastatin, relafen [nabumetone], and tramadol. Drug: Erika Crawford  reports no history of drug use. Alcohol:  reports current alcohol use. Tobacco:  reports that she has quit smoking. She has never used smokeless tobacco. Social: Erika Crawford  reports that she has quit smoking. She has never used smokeless tobacco. She reports current alcohol use. She reports that she does not use drugs. Medical:  has a past medical history of Arthritis, Complication of  anesthesia, Hypertension, Hypothyroidism, Sleep apnea, and Thyroid disease. 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 Family: family history is not on file.  Laboratory Chemistry Profile   Renal Lab Results  Component Value Date   BUN 20 05/28/2022   CREATININE 0.67 05/28/2022   GFRAA >60 09/17/2019   GFRNONAA >60 05/28/2022    Hepatic Lab Results  Component Value Date   AST 16 01/19/2014   ALT 25 01/19/2014   ALBUMIN 3.6 01/19/2014   ALKPHOS 62 01/19/2014    Electrolytes Lab Results  Component Value Date   NA 138 05/28/2022   K 3.8 05/28/2022   CL 109 05/28/2022   CALCIUM 9.1 05/28/2022    Bone No results found for: "VD25OH", "VD125OH2TOT", "[id number]", "QM5784ON6", "25OHVITD1", "25OHVITD2", "25OHVITD3", "TESTOFREE", "TESTOSTERONE"  Inflammation (CRP: Acute Phase) (ESR: Chronic Phase) No results found for: "CRP", "ESRSEDRATE", "LATICACIDVEN"       Note: Above Lab results reviewed.  Recent Imaging Review  DG PAIN CLINIC C-ARM 1-60 MIN NO REPORT Fluoro was used, but no Radiologist interpretation will be provided.  Please refer to "NOTES" tab for provider progress note.  Note: Reviewed        Physical Exam  General appearance: Well nourished, well developed, and well hydrated. In no apparent acute distress Mental status: Alert, oriented x 3 (person, place, & time)       Respiratory: No evidence of acute respiratory distress Eyes: PERLA Vitals: BP (!) 123/58 (Cuff Size: Normal)   Temp (!) 97.3 F (36.3 C) (Temporal)   Resp 14   Ht 5\' 7"  (1.702 m)   Wt 197 lb (89.4 kg)   SpO2 100%   BMI 30.85 kg/m  BMI: Estimated body mass index is 30.85 kg/m as calculated from the following:   Height as of this  encounter: 5\' 7"  (1.702 m).   Weight as of this encounter: 197 lb (89.4 kg). Ideal: Ideal body weight: 61.6 kg (135 lb 12.9 oz) Adjusted ideal body weight: 72.7 kg (160 lb 4.5 oz)  Lumbar Spine Area Exam  Skin & Axial Inspection: No masses, redness, or swelling Alignment: Symmetrical Functional ROM: Decreased ROM       Stability: No instability detected Muscle Tone/Strength: Functionally intact. No obvious neuro-muscular anomalies detected. Sensory (Neurological): Dermatomal pain pattern Palpation: No palpable anomalies       Provocative Tests: Hyperextension/rotation test: deferred today       Lumbar quadrant test (Kemp's test): (+) on the left for foraminal stenosis  Gait & Posture Assessment  Ambulation: Unassisted Gait: Antalgic gait (limping) Posture: Difficulty standing up straight, due to pain  Lower Extremity Exam    Side: Right lower extremity  Side: Left lower extremity  Stability: No instability observed          Stability: No instability observed          Skin & Extremity Inspection: Skin color, temperature, and hair growth are WNL. No peripheral edema or cyanosis. No masses, redness, swelling, asymmetry, or associated skin lesions. No contractures.  Skin & Extremity Inspection: Skin color, temperature, and hair growth are WNL. No peripheral edema or cyanosis. No masses, redness, swelling, asymmetry, or associated skin lesions. No contractures.  Functional ROM: Unrestricted ROM                  Functional ROM: Decreased ROM                  Muscle Tone/Strength: Functionally intact. No obvious neuro-muscular anomalies detected.  Muscle Tone/Strength: Functionally intact. No obvious  neuro-muscular anomalies detected.  Sensory (Neurological): Unimpaired        Sensory (Neurological): Dermatomal pain pattern        DTR: Patellar: deferred today Achilles: deferred today Plantar: deferred today  DTR: Patellar: deferred today Achilles: deferred today Plantar: deferred today   Palpation: No palpable anomalies  Palpation: No palpable anomalies    Assessment   Status Diagnosis  Having a Flare-up Having a Flare-up Controlled 1. Lumbar radiculopathy   2. Lumbar foraminal stenosis   3. Chronic pain syndrome      Updated Problems: No problems updated.   Plan of Care    Erika Crawford has a current medication list which includes the following long-term medication(s): diltiazem, diltiazem, gabapentin, levothyroxine, linaclotide, mometasone, olmesartan, omeprazole, and levothyroxine.  Orders:  Orders Placed This Encounter  Procedures   Lumbar Epidural Injection    Standing Status:   Future    Expiration Date:   12/03/2023    Scheduling Instructions:     Procedure: Interlaminar Lumbar Epidural Steroid injection (LESI)            Laterality: LEFT L5/S1     Sedation: without     Timeframe: ASAA    Where will this procedure be performed?:   [facility]   Follow-up plan:   Return in about 27 days (around 09/29/2023) for Left L5/S1 ESI, in clinic [facility].    Recent Visits No visits were found meeting these conditions. Showing recent visits within past 90 days and meeting all other requirements Today's Visits Date Type Provider Dept  09/02/23 Office Visit Edward Jolly, MD [facility]  Showing today's visits and meeting all other requirements Future Appointments No visits were found meeting these conditions. Showing future appointments within next 90 days and meeting all other requirements  I discussed the assessment and treatment plan with the patient. The patient was provided an opportunity to ask questions and all were answered. The patient agreed with the plan and demonstrated an understanding of the instructions.  Patient advised to call back or seek an in-person evaluation if the symptoms or condition worsens.  Duration of encounter: .  Note by: Edward Jolly, MD Date: 09/02/2023; Time: 3:14 PM

## 2023-09-02 NOTE — Patient Instructions (Signed)

## 2023-09-29 ENCOUNTER — Ambulatory Visit
Attending: Student in an Organized Health Care Education/Training Program | Admitting: Student in an Organized Health Care Education/Training Program

## 2023-09-29 ENCOUNTER — Encounter: Payer: Self-pay | Admitting: Student in an Organized Health Care Education/Training Program

## 2023-09-29 ENCOUNTER — Ambulatory Visit
Admission: RE | Admit: 2023-09-29 | Discharge: 2023-09-29 | Disposition: A | Discharge status: Home / Self Care | Source: Ambulatory Visit | Attending: Student in an Organized Health Care Education/Training Program | Admitting: Student in an Organized Health Care Education/Training Program

## 2023-09-29 DIAGNOSIS — M5416 Radiculopathy, lumbar region: Secondary | ICD-10-CM | POA: Diagnosis present

## 2023-09-29 DIAGNOSIS — M48061 Spinal stenosis, lumbar region without neurogenic claudication: Secondary | ICD-10-CM | POA: Insufficient documentation

## 2023-09-29 DIAGNOSIS — G894 Chronic pain syndrome: Secondary | ICD-10-CM | POA: Diagnosis present

## 2023-09-29 MED ORDER — IOHEXOL 180 MG/ML  SOLN
10.0000 mL | Freq: Once | INTRAMUSCULAR | Status: AC
  Administered 2023-09-29: 10 mL via EPIDURAL

## 2023-09-29 MED ORDER — LIDOCAINE HCL 2 % IJ SOLN
20.0000 mL | Freq: Once | INTRAMUSCULAR | Status: AC
  Administered 2023-09-29: 400 mg

## 2023-09-29 MED ORDER — DEXAMETHASONE SODIUM PHOSPHATE 10 MG/ML IJ SOLN
INTRAMUSCULAR | Status: AC
  Filled 2023-09-29: qty 1

## 2023-09-29 MED ORDER — ROPIVACAINE HCL 2 MG/ML IJ SOLN
INTRAMUSCULAR | Status: AC
  Filled 2023-09-29: qty 20

## 2023-09-29 MED ORDER — SODIUM CHLORIDE (PF) 0.9 % IJ SOLN
INTRAMUSCULAR | Status: AC
  Filled 2023-09-29: qty 10

## 2023-09-29 MED ORDER — ROPIVACAINE HCL 2 MG/ML IJ SOLN
2.0000 mL | Freq: Once | INTRAMUSCULAR | Status: AC
  Administered 2023-09-29: 2 mL via EPIDURAL

## 2023-09-29 MED ORDER — DEXAMETHASONE SODIUM PHOSPHATE 10 MG/ML IJ SOLN
10.0000 mg | Freq: Once | INTRAMUSCULAR | Status: AC
  Administered 2023-09-29: 10 mg

## 2023-09-29 MED ORDER — IOHEXOL 180 MG/ML  SOLN
INTRAMUSCULAR | Status: AC
  Filled 2023-09-29: qty 20

## 2023-09-29 MED ORDER — LIDOCAINE HCL 2 % IJ SOLN
INTRAMUSCULAR | Status: AC
  Filled 2023-09-29: qty 20

## 2023-09-29 MED ORDER — SODIUM CHLORIDE 0.9% FLUSH
2.0000 mL | Freq: Once | INTRAVENOUS | Status: AC
  Administered 2023-09-29: 2 mL

## 2023-09-29 NOTE — Progress Notes (Signed)
 Patient's Name: Erika Crawford  MRN: [id number]  Referring Provider: Rex Castor, MD  DOB: 09-06-1940  PCP: Rex Castor, MD  DOS: 09/29/2023  Note by: Cephus Collin, MD  Service setting: Ambulatory outpatient  Specialty: Interventional Pain Management  Patient type: Established  Location: [facility] (AMB) Pain Management Facility  Visit type: Interventional Procedure   Primary Reason for Visit: Interventional Pain Management Treatment. CC: Leg Pain (Both thighs)  Procedure:       Anesthesia, Analgesia, Anxiolysis:  Type: Therapeutic Inter-Laminar Epidural Steroid Injection #1 FOR 2025 Region: Lumbar Level: L5-S1 Level. Laterality: Left-Sided         Type: Local Anesthesia Indication(s): Analgesia         Route: Infiltration ([place]/IM) IV Access: Declined Sedation: Declined  Local Anesthetic: Lidocaine 1-2%   Indications: 1. Lumbar radiculopathy   2. Lumbar foraminal stenosis   3. Chronic pain syndrome    Pain Score: Pre-procedure: 8/10 Post-procedure:4//10  Pre-op Assessment:  Erika Crawford is a 83 y.o. (year old), female patient, seen today for interventional treatment. 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 Erika Crawford has a current medication list which includes the following prescription(s): amlodipine, lidocaine, cholecalciferol, cyanocobalamin, cyclobenzaprine, diltiazem, diltiazem, fluticasone, gabapentin, homeopathic products, hydrocodone-acetaminophen, hydrocodone-acetaminophen, levothyroxine, levothyroxine, linaclotide, magnesium, magnesium gluconate, magnesium oxide, menthol (topical analgesic), menthol (topical analgesic), mometasone, naphazoline-pheniramine, olmesartan, omeprazole, ondansetron, pramipexole, senna,  senna-docusate, and cyanocobalamin. Her primarily concern today is the Leg Pain (Both thighs)   Initial Vital Signs:  Pulse Rate: 84 Temp: (!) 97.5 F (36.4 C) Resp: 16 BP: (!) 146/85 SpO2: 100 %  BMI: Estimated body mass index is 30.85 kg/m as calculated from the following:   Height as of this encounter: 5\' 7"  (1.702 m).   Weight as of this encounter: 197 lb (89.4 kg).  Risk Assessment: Allergies: Reviewed. She is allergic to zanaflex [tizanidine], celebrex [celecoxib], codeine, etodolac, hydrochlorothiazide, oxycodone, pravastatin, relafen [nabumetone], and tramadol.  Allergy Precautions: None required Coagulopathies: Reviewed. None identified.  Blood-thinner therapy: None at this time Active Infection(s): Reviewed. None identified. Erika Crawford is afebrile  Site Confirmation: Erika Crawford was asked to confirm the procedure and laterality before marking the site Procedure checklist: Completed Consent: Before the procedure and under the influence of no sedative(s), amnesic(s), or anxiolytics, the patient was informed of the treatment options, risks and possible complications. To fulfill our ethical and legal obligations, as recommended by the American Medical Association's Code of Ethics, I have informed the patient of my clinical impression; the nature and purpose of the treatment or procedure; the risks, benefits, and possible complications of the intervention; the alternatives, including doing nothing; the risk(s) and benefit(s) of the alternative treatment(s) or procedure(s); and the risk(s) and benefit(s) of doing nothing. The patient was provided information about the general risks and possible complications associated with the procedure. These may include, but are not limited to: failure to achieve desired goals, infection, bleeding, organ or nerve damage, allergic reactions, paralysis, and death. In addition, the patient was informed of those risks and complications associated to  Spine-related procedures, such as failure to decrease pain; infection (i.e.: Meningitis, epidural or intraspinal abscess); bleeding (i.e.: epidural hematoma, subarachnoid hemorrhage, or any other type of intraspinal or peri-dural bleeding); organ or nerve damage (i.e.: Any type of peripheral nerve, nerve root, or spinal cord injury) with subsequent damage to sensory, motor,  and/or autonomic systems, resulting in permanent pain, numbness, and/or weakness of one or several areas of the body; allergic reactions; (i.e.: anaphylactic reaction); and/or death. Furthermore, the patient was informed of those risks and complications associated with the medications. These include, but are not limited to: allergic reactions (i.e.: anaphylactic or anaphylactoid reaction(s)); adrenal axis suppression; blood sugar elevation that in diabetics may result in ketoacidosis or comma; water retention that in patients with history of congestive heart failure may result in shortness of breath, pulmonary edema, and decompensation with resultant heart failure; weight gain; swelling or edema; medication-induced neural toxicity; particulate matter embolism and blood vessel occlusion with resultant organ, and/or nervous system infarction; and/or aseptic necrosis of one or more joints. Finally, the patient was informed that Medicine is not an exact science; therefore, there is also the possibility of unforeseen or unpredictable risks and/or possible complications that may result in a catastrophic outcome. The patient indicated having understood very clearly. We have given the patient no guarantees and we have made no promises. Enough time was given to the patient to ask questions, all of which were answered to the patient's satisfaction. Erika Crawford has indicated that she wanted to continue with the procedure. Attestation: I, the ordering provider, attest that I have discussed with the patient the benefits, risks, side-effects, alternatives,  likelihood of achieving goals, and potential problems during recovery for the procedure that I have provided informed consent. Date  Time:   Pre-Procedure Preparation:  Monitoring: As per clinic protocol. Respiration, ETCO2, SpO2, BP, heart rate and rhythm monitor placed and checked for adequate function Safety Precautions: Patient was assessed for positional comfort and pressure points before starting the procedure. Time-out: I initiated and conducted the "Time-out" before starting the procedure, as per protocol. The patient was asked to participate by confirming the accuracy of the "Time Out" information. Verification of the correct person, site, and procedure were performed and confirmed by me, the nursing staff, and the patient. "Time-out" conducted as per Joint Commission's Universal Protocol (UP.01.01.01). Time: 1125  Description of Procedure:       Position: Prone with head of the table was raised to facilitate breathing. Target Area: The interlaminar space, initially targeting the lower laminar border of the superior vertebral body. Approach: Paramedial approach. Area Prepped: Entire Posterior Lumbar Region Prepping solution: ChloraPrep (2% chlorhexidine gluconate and 70% isopropyl alcohol) Safety Precautions: Aspiration looking for blood return was conducted prior to all injections. At no point did we inject any substances, as a needle was being advanced. No attempts were made at seeking any paresthesias. Safe injection practices and needle disposal techniques used. Medications properly checked for expiration dates. SDV (single dose vial) medications used. Description of the Procedure: Protocol guidelines were followed. The procedure needle was introduced through the skin, ipsilateral to the reported pain, and advanced to the target area. Bone was contacted and the needle walked caudad, until the lamina was cleared. The epidural space was identified using "loss-of-resistance technique" with  2-3 ml of PF-NaCl (0.9% NSS), in a 5cc LOR glass syringe. Vitals:   09/29/23 1109 09/29/23 1110 09/29/23 1120 09/29/23 1130  BP: (!) 155/99 (!) 169/85 (!) 153/86 (!) 151/84  Pulse:  82 84 82  Resp:  16 16 16   Temp:      SpO2:  100% 99% 100%  Weight:      Height:        Start Time: 1125 hrs. End Time: 1129 hrs. Materials:  Needle(s) Type: Epidural needle Gauge: 22G Length: 3.5-in Medication(s):  Please see orders for medications and dosing details. 6CC solution made of 3 cc of preservative-free saline, 2 cc of 0.2% ropivacaine, 1 cc of Decadron 10 mg per cc Imaging Guidance (Spinal):  Type of Imaging Technique: Fluoroscopy Guidance (Spinal) Indication(s): Assistance in needle guidance and placement for procedures requiring needle placement in or near specific anatomical locations not easily accessible without such assistance. Exposure Time: Please see nurses notes. Contrast: Before injecting any contrast, we confirmed that the patient did not have an allergy to iodine, shellfish, or radiological contrast. Once satisfactory needle placement was completed at the desired level, radiological contrast was injected. Contrast injected under live fluoroscopy. No contrast complications. See chart for type and volume of contrast used. Fluoroscopic Guidance: I was personally present during the use of fluoroscopy. "Tunnel Vision Technique" used to obtain the best possible view of the target area. Parallax error corrected before commencing the procedure. "Direction-depth-direction" technique used to introduce the needle under continuous pulsed fluoroscopy. Once target was reached, antero-posterior, oblique, and lateral fluoroscopic projection used confirm needle placement in all planes. Images permanently stored in EMR. Interpretation: I personally interpreted the imaging intraoperatively. Adequate needle placement confirmed in multiple planes. Appropriate spread of contrast into desired area was observed.  No evidence of afferent or efferent intravascular uptake. No intrathecal or subarachnoid spread observed. Permanent images saved into the patient's record.  Post-operative Assessment:  Post-procedure Vital Signs:  Pulse Rate: 82 Temp: (!) 97.5 F (36.4 C) Resp: 16 BP: (!) 151/84 SpO2: 100 %  EBL: None  Complications: No immediate post-treatment complications observed by team, or reported by patient.  Note: The patient tolerated the entire procedure well. A repeat set of vitals were taken after the procedure and the patient was kept under observation following institutional policy, for this type of procedure. Post-procedural neurological assessment was performed, showing return to baseline, prior to discharge. The patient was provided with post-procedure discharge instructions, including a section on how to identify potential problems. Should any problems arise concerning this procedure, the patient was given instructions to immediately contact us , at any time, without hesitation. In any case, we plan to contact the patient by telephone for a follow-up status report regarding this interventional procedure.  Comments:  No additional relevant information.  5 out of 5 strength bilateral lower extremity: Plantar flexion, dorsiflexion, knee flexion, knee extension.  Plan of Care   Imaging Orders         DG PAIN CLINIC C-ARM 1-60 MIN NO REPORT     Procedure Orders    No procedure(s) ordered today    Medications ordered for procedure: Meds ordered this encounter  Medications   iohexol (OMNIPAQUE) 180 MG/ML injection 10 mL    Must be Myelogram-compatible. If not available, you may substitute with a water-soluble, non-ionic, hypoallergenic, myelogram-compatible radiological contrast medium.   lidocaine (XYLOCAINE) 2 % (with pres) injection 400 mg   ropivacaine (PF) 2 mg/mL (0.2%) (NAROPIN) injection 2 mL   sodium chloride flush (NS) 0.9 % injection 2 mL   dexamethasone (DECADRON) injection  10 mg    Medications administered: We administered iohexol, lidocaine, ropivacaine (PF) 2 mg/mL (0.2%), sodium chloride flush, and dexamethasone.  See the medical record for exact dosing, route, and time of administration.  New Prescriptions   No medications on file   Disposition: Discharge home  Discharge Date & Time: 09/29/2023; 1135 hrs.   Physician-requested Follow-up: Return in about 6 weeks (around 11/10/2023) for PPE, VV.  Future Appointments  Date Time Provider Department Center  10/06/2023  2:00 PM [facility]

## 2023-09-29 NOTE — Patient Instructions (Signed)

## 2023-09-29 NOTE — Progress Notes (Signed)
 Safety precautions to be maintained throughout the outpatient stay will include: orient to surroundings, keep bed in low position, maintain call bell within reach at all times, provide assistance with transfer out of bed and ambulation.

## 2023-09-30 ENCOUNTER — Telehealth: Payer: Self-pay

## 2023-09-30 NOTE — Telephone Encounter (Signed)
 No issues post-procedure.

## 2023-10-06 ENCOUNTER — Ambulatory Visit
Admission: RE | Admit: 2023-10-06 | Discharge: 2023-10-06 | Disposition: A | Discharge status: Home / Self Care | Source: Ambulatory Visit | Attending: Internal Medicine | Admitting: Internal Medicine

## 2023-10-06 DIAGNOSIS — Z1231 Encounter for screening mammogram for malignant neoplasm of breast: Secondary | ICD-10-CM | POA: Insufficient documentation

## 2023-11-12 ENCOUNTER — Ambulatory Visit
Attending: Student in an Organized Health Care Education/Training Program | Admitting: Student in an Organized Health Care Education/Training Program

## 2023-11-12 DIAGNOSIS — G894 Chronic pain syndrome: Secondary | ICD-10-CM

## 2023-11-12 DIAGNOSIS — M5416 Radiculopathy, lumbar region: Secondary | ICD-10-CM | POA: Diagnosis not present

## 2023-11-12 DIAGNOSIS — M48061 Spinal stenosis, lumbar region without neurogenic claudication: Secondary | ICD-10-CM

## 2023-11-12 NOTE — Progress Notes (Signed)
 PROVIDER NOTE: Interpretation of information contained herein should be left to medically-trained personnel. Specific patient instructions are provided elsewhere under "Patient Instructions" section of medical record. This document was created in part using AI and STT-dictation technology, any transcriptional errors that may result from this process are unintentional.  Patient: Erika Crawford  Service: E/M   PCP: Rex Castor, MD  DOB: [birth year]  DOS: 11/12/2023  Provider: Cephus Collin, MD  MRN: [id number]  Delivery: Virtual Visit  Specialty: Interventional Pain Management  Type: Established Patient  Setting: Ambulatory outpatient facility  Specialty designation: 09  Referring Prov.: Rex Castor, MD  Location: Remote location       Virtual Encounter - Pain Management PROVIDER NOTE: Information contained herein reflects review and annotations entered in association with encounter. Interpretation of such information and data should be left to medically-trained personnel. Information provided to patient can be located elsewhere in the medical record under "Patient Instructions". Document created using STT-dictation technology, any transcriptional errors that may result from process are unintentional.    Contact & Pharmacy Preferred: [phone] Home: [phone] (home) Mobile: [phone] (mobile) E-mail: No e-mail address on record  [id number] - [place], [place] - [address] AT [facility] OF [address] [place] [address] Phone: [phone] Fax: [phone]   Pre-screening  Erika Crawford offered "in-person" vs "virtual" encounter. She indicated preferring virtual for this encounter.   Reason COVID-19*  Social distancing based on CDC and AMA recommendations.   I contacted Erika Crawford on 11/12/2023 via telephone.      I clearly identified myself as Cephus Collin, MD. I verified that I was speaking with the correct person using two  identifiers (Name: Erika Crawford, and date of birth: 84/06/1940).  Consent I sought verbal advanced consent from Erika Crawford for virtual visit interactions. I informed Erika Crawford of possible security and privacy concerns, risks, and limitations associated with providing "not-in-person" medical evaluation and management services. I also informed Erika Crawford of the availability of "in-person" appointments. Finally, I informed her that there would be a charge for the virtual visit and that she could be  personally, fully or partially, financially responsible for it. Ms. Retz expressed understanding and agreed to proceed.   Historic Elements   Erika Crawford is a 83 y.o. year old, female patient evaluated today after our last contact on 09/29/2023. Erika Crawford  has a past medical history of Arthritis, Complication of anesthesia, Hypertension, Hypothyroidism, Sleep apnea, and Thyroid disease. 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 Erika Crawford has a current medication list which includes the following prescription(s): amlodipine , lidocaine , cholecalciferol , cyanocobalamin, cyclobenzaprine, diltiazem, diltiazem, fluticasone , gabapentin , homeopathic products, hydrocodone -acetaminophen , hydrocodone -acetaminophen , levothyroxine , linaclotide, magnesium , magnesium  gluconate, magnesium  oxide, menthol (topical analgesic), menthol (topical analgesic), mometasone, naphazoline-pheniramine, olmesartan, omeprazole, ondansetron , pramipexole, senna, senna-docusate, cyanocobalamin, and levothyroxine . She  reports that she has quit smoking. She has never used smokeless tobacco. She reports current alcohol use. She reports that she does not use drugs. Erika Crawford is  allergic to zanaflex [tizanidine], celebrex [celecoxib], codeine, etodolac, hydrochlorothiazide, oxycodone , pravastatin, relafen [nabumetone], and tramadol .  BMI: Estimated body mass index is 30.85 kg/m as calculated from the following:   Height as of 09/29/23: 5\' 7"  (1.702 m).   Weight as of 09/29/23: 197 lb (  89.4 kg). Last encounter: 09/02/2023. Last procedure: 09/29/2023.  HPI  Today, she is being contacted for  Post-Procedure Evaluation Type: Therapeutic Inter-Laminar Epidural Steroid Injection #1 FOR 2025 Region: Lumbar Level: L5-S1 Level. Laterality: Left-Sided       Effectiveness:  Initial hour after procedure: 100 %  Subsequent 4-6 hours post-procedure: 100 %  Analgesia past initial 6 hours: 100 % (helped for a few days and then pain returned.  is using topical in the morning upon rising.)  Ongoing improvement:  Analgesic:  pain relief currently at 50% Function: Somewhat improved ROM: Somewhat improved   UDS:  Summary  Date Value Ref Range Status  07/30/2018 FINAL  Final    Comment:    ==================================================================== [facility] COMP DRUG ANALYSIS,UR ==================================================================== Test                             Result       Flag       Units Drug Present and Declared for Prescription Verification   Gabapentin                      PRESENT      EXPECTED   Acetaminophen                   PRESENT      EXPECTED   Naproxen                       PRESENT      EXPECTED   Lidocaine                       PRESENT      EXPECTED Drug Absent but Declared for Prescription Verification   Hydrocodone                     Not Detected UNEXPECTED ng/mg creat ==================================================================== Test                      Result    Flag   Units      Ref Range   Creatinine              272              mg/dL       >=16 ==================================================================== Declared Medications:  The flagging and interpretation on this report are based on the  following declared medications.  Unexpected results may arise from  inaccuracies in the declared medications.  **Note: The testing scope of this panel includes these medications:  Gabapentin   Hydrocodone  (Hydrocodone -Acetaminophen )  Naproxen  **Note: The testing scope of this panel does not include small to  moderate amounts of these reported medications:  Acetaminophen   Acetaminophen  (Hydrocodone -Acetaminophen )  Lidocaine   **Note: The testing scope of this panel does not include following  reported medications:  Alendronate (Fosamax)  Amlodipine  (Norvasc )  Cholecalciferol   Levothyroxine   Mometasone  Olmesartan (Benicar)  Omeprazole (Prilosec) ==================================================================== For clinical consultation, please call [phone]. ====================================================================    No results found for: "CBDTHCR", "D8THCCBX", "D9THCCBX"  Laboratory Chemistry Profile   Renal Lab Results  Component Value Date   BUN 20 05/28/2022   CREATININE 0.67 05/28/2022   GFRAA >60 09/17/2019   GFRNONAA >60 05/28/2022    Hepatic Lab Results  Component Value Date   AST 16 01/19/2014   ALT 25 01/19/2014   ALBUMIN 3.6 01/19/2014   ALKPHOS 62  01/19/2014    Electrolytes Lab Results  Component Value Date   NA 138 05/28/2022   K 3.8 05/28/2022   CL 109 05/28/2022   CALCIUM  9.1 05/28/2022    Bone No results found for: "VD25OH", "VD125OH2TOT", "ZO1096EA5", "WU9811BJ4", "25OHVITD1", "25OHVITD2", "25OHVITD3", "TESTOFREE", "TESTOSTERONE"  Inflammation (CRP: Acute Phase) (ESR: Chronic Phase) No results found for: "CRP", "ESRSEDRATE", "LATICACIDVEN"       Note: Above Lab results reviewed.  Imaging   MM 3D SCREENING MAMMOGRAM BILATERAL BREAST CLINICAL DATA:   Screening.  EXAM: DIGITAL SCREENING BILATERAL MAMMOGRAM WITH TOMOSYNTHESIS AND CAD  TECHNIQUE: Bilateral screening digital craniocaudal and mediolateral oblique mammograms were obtained. Bilateral screening digital breast tomosynthesis was performed. The images were evaluated with computer-aided detection.  COMPARISON:  Previous exam(s).  ACR Breast Density Category b: There are scattered areas of fibroglandular density.  FINDINGS: There are no findings suspicious for malignancy.  IMPRESSION: No mammographic evidence of malignancy. A result letter of this screening mammogram will be mailed directly to the patient.  RECOMMENDATION: Screening mammogram in one year. (Code:SM-B-01Y)  BI-RADS CATEGORY  1: Negative.  Electronically Signed   By: Alger Infield M.D.   On: 10/09/2023 10:44  Assessment   1. Lumbar radiculopathy   2. Lumbar foraminal stenosis   3. Chronic pain syndrome      Plan of Care  Patient is responding favorably to her previous lumbar epidural steroid injection.  She is continuing to utilize creams and rubs as adjuncts to help manage her pain.  We will continue to monitor her symptoms, repeat lumbar ESI as needed, as needed order placed Follow-up plan:   No follow-ups on file.      Previous lumbar epidurals have been very helpful in managing her lumbar radicular pain every 3 to every 4 months previously done on 01/04/2019.  status post SI joint injection 04/05/2019-not effective, repeat ESI prn.           Recent Visits Date Type Provider Dept  09/29/23 Procedure visit Cephus Collin, MD [facility]  09/02/23 Office Visit Cephus Collin, MD [facility]  Showing recent visits within past 90 days and meeting all other requirements Today's Visits Date Type Provider Dept  11/12/23 Office Visit Cephus Collin, MD [facility]  Showing today's visits and meeting all other requirements Future Appointments No visits were  found meeting these conditions. Showing future appointments within next 90 days and meeting all other requirements  I discussed the assessment and treatment plan with the patient. The patient was provided an opportunity to ask questions and all were answered. The patient agreed with the plan and demonstrated an understanding of the instructions.  Patient advised to call back or seek an in-person evaluation if the symptoms or condition worsens.  Duration of encounter: .  Note by: Cephus Collin, MD Date: 11/12/2023; Time: 3:37 PM

## 2023-12-04 ENCOUNTER — Ambulatory Visit: Attending: Student in an Organized Health Care Education/Training Program | Admitting: Nurse Practitioner

## 2023-12-04 ENCOUNTER — Encounter: Payer: Self-pay | Admitting: Nurse Practitioner

## 2023-12-04 ENCOUNTER — Telehealth: Payer: Self-pay | Admitting: Student in an Organized Health Care Education/Training Program

## 2023-12-04 VITALS — BP 153/63 | HR 94 | Temp 98.0°F | Resp 16

## 2023-12-04 DIAGNOSIS — M5416 Radiculopathy, lumbar region: Secondary | ICD-10-CM | POA: Diagnosis present

## 2023-12-04 DIAGNOSIS — G894 Chronic pain syndrome: Secondary | ICD-10-CM

## 2023-12-04 MED ORDER — METHOCARBAMOL 1000 MG/10ML IJ SOLN
200.0000 mg | Freq: Once | INTRAMUSCULAR | Status: AC
  Administered 2023-12-04: 1000 mg via INTRAMUSCULAR
  Filled 2023-12-04: qty 10

## 2023-12-04 MED ORDER — KETOROLAC TROMETHAMINE 30 MG/ML IJ SOLN
30.0000 mg | Freq: Once | INTRAMUSCULAR | Status: AC
  Administered 2023-12-04: 30 mg via INTRAMUSCULAR
  Filled 2023-12-04: qty 1

## 2023-12-04 NOTE — Telephone Encounter (Signed)
 Patient states she is having a lot of pain in her leg. She saw a PA that put her on prednisone  and has taken it 1 day so far. She wants to know is there any kind of shot she can come in and get today?

## 2023-12-04 NOTE — Telephone Encounter (Signed)
 Could we offer for her to come for a nurse visit today for Toradol /Methocarbamol ? No history of renal dysfunction, only hydronephrosis.

## 2023-12-15 ENCOUNTER — Telehealth: Payer: Self-pay | Admitting: Student in an Organized Health Care Education/Training Program

## 2023-12-15 DIAGNOSIS — G894 Chronic pain syndrome: Secondary | ICD-10-CM

## 2023-12-15 DIAGNOSIS — M5416 Radiculopathy, lumbar region: Secondary | ICD-10-CM

## 2023-12-15 MED ORDER — HYDROCODONE-ACETAMINOPHEN 7.5-325 MG PO TABS: 1.0000 | ORAL_TABLET | Freq: Two times a day (BID) | ORAL | 0 refills | Status: AC | PRN

## 2023-12-15 NOTE — Telephone Encounter (Signed)
 PT has appt on 12-22-23 at 140pm , patient wants to know what can she take for pain until her appt. PT states that she is in a lot of pain, can't walk. Please give patient a call. TY

## 2023-12-15 NOTE — Telephone Encounter (Signed)
 Spoke with patient and she states that she has been taking the Hydrocodone  but is out now.  States that it has not been helping and would like for you to increase the dosge.  States she can't take Tramadol .

## 2023-12-22 ENCOUNTER — Ambulatory Visit
Admission: RE | Admit: 2023-12-22 | Discharge: 2023-12-22 | Disposition: A | Discharge status: Home / Self Care | Source: Ambulatory Visit | Attending: Student in an Organized Health Care Education/Training Program | Admitting: Student in an Organized Health Care Education/Training Program

## 2023-12-22 ENCOUNTER — Encounter: Payer: Self-pay | Admitting: Student in an Organized Health Care Education/Training Program

## 2023-12-22 ENCOUNTER — Ambulatory Visit (HOSPITAL_BASED_OUTPATIENT_CLINIC_OR_DEPARTMENT_OTHER): Admitting: Student in an Organized Health Care Education/Training Program

## 2023-12-22 VITALS — BP 142/60 | HR 95 | Temp 97.2°F | Resp 16 | Ht 67.0 in | Wt 197.0 lb

## 2023-12-22 DIAGNOSIS — G894 Chronic pain syndrome: Secondary | ICD-10-CM | POA: Diagnosis present

## 2023-12-22 DIAGNOSIS — M5416 Radiculopathy, lumbar region: Secondary | ICD-10-CM | POA: Insufficient documentation

## 2023-12-22 DIAGNOSIS — M48061 Spinal stenosis, lumbar region without neurogenic claudication: Secondary | ICD-10-CM

## 2023-12-22 MED ORDER — DEXAMETHASONE SODIUM PHOSPHATE 10 MG/ML IJ SOLN
10.0000 mg | Freq: Once | INTRAMUSCULAR | Status: AC
  Administered 2023-12-22: 10 mg

## 2023-12-22 MED ORDER — ROPIVACAINE HCL 2 MG/ML IJ SOLN
INTRAMUSCULAR | Status: AC
  Filled 2023-12-22: qty 20

## 2023-12-22 MED ORDER — ROPIVACAINE HCL 2 MG/ML IJ SOLN
2.0000 mL | Freq: Once | INTRAMUSCULAR | Status: AC
  Administered 2023-12-22: 2 mL via EPIDURAL

## 2023-12-22 MED ORDER — IOHEXOL 180 MG/ML  SOLN
10.0000 mL | Freq: Once | INTRAMUSCULAR | Status: AC
  Administered 2023-12-22: 10 mL via EPIDURAL

## 2023-12-22 MED ORDER — IOHEXOL 180 MG/ML  SOLN
INTRAMUSCULAR | Status: AC
  Filled 2023-12-22: qty 20

## 2023-12-22 MED ORDER — DEXAMETHASONE SODIUM PHOSPHATE 10 MG/ML IJ SOLN
INTRAMUSCULAR | Status: AC
Start: 2023-12-22 — End: 2023-12-22
  Filled 2023-12-22: qty 1

## 2023-12-22 MED ORDER — SODIUM CHLORIDE 0.9% FLUSH
2.0000 mL | Freq: Once | INTRAVENOUS | Status: AC
  Administered 2023-12-22: 2 mL

## 2023-12-22 MED ORDER — LIDOCAINE HCL 2 % IJ SOLN
INTRAMUSCULAR | Status: AC
  Filled 2023-12-22: qty 20

## 2023-12-22 MED ORDER — SODIUM CHLORIDE (PF) 0.9 % IJ SOLN
INTRAMUSCULAR | Status: AC
  Filled 2023-12-22: qty 10

## 2023-12-22 MED ORDER — LIDOCAINE HCL 2 % IJ SOLN
20.0000 mL | Freq: Once | INTRAMUSCULAR | Status: AC
  Administered 2023-12-22: 400 mg

## 2023-12-22 NOTE — Progress Notes (Signed)
 Patient to PACU d/t feeling unsteady on her feet. Reports that her legs feel numb and she is dizzy.  VS taken, stable.  Assisted patient with ambulation and she reports that her legs are numb and she remains unsteady.  She is going to call and see if someone can come and pick her up since she doesn't feel safe driving home.

## 2023-12-22 NOTE — Progress Notes (Signed)
 Safety precautions to be maintained throughout the outpatient stay will include: orient to surroundings, keep bed in low position, maintain call bell within reach at all times, provide assistance with transfer out of bed and ambulation.

## 2023-12-22 NOTE — Progress Notes (Signed)
 Patient's Name: Erika Crawford  MRN: [id number]  Referring Provider: Sherial Bail, MD  DOB: 10-Jan-1941  PCP: Sherial Bail, MD  DOS: 12/22/2023  Note by: [name], MD  Service setting: Ambulatory outpatient  Specialty: Interventional Pain Management  Patient type: Established  Location: [facility] (AMB) Pain Management Facility  Visit type: Interventional Procedure   Primary Reason for Visit: Interventional Pain Management Treatment. CC: Leg Pain (B/L)  Procedure:       Anesthesia, Analgesia, Anxiolysis:  Type: Therapeutic Inter-Laminar Epidural Steroid Injection #2 FOR 2025 Region: Lumbar Level: L5-S1 Level. Laterality: Left-Sided         Type: Local Anesthesia Indication(s): Analgesia         Route: Infiltration ([place]/IM) IV Access: Declined Sedation: Declined  Local Anesthetic: Lidocaine  1-2%   Indications: 1. Lumbar radiculopathy   2. Lumbar foraminal stenosis   3. Chronic pain syndrome    Pain Score: Pre-procedure: 8/10 Post-procedure:4//10  Pre-op Assessment:  Erika Crawford is a 83 y.o. (year old), female patient, seen today for interventional treatment. 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 Erika Crawford has a current medication list which includes the following prescription(s): amlodipine , lidocaine , cholecalciferol , cyanocobalamin, cyclobenzaprine, diltiazem, diltiazem, fluticasone , gabapentin , homeopathic products, hydrocodone -acetaminophen , levothyroxine , levothyroxine , linaclotide, magnesium , magnesium  gluconate, magnesium  oxide, menthol (topical analgesic), menthol (topical analgesic), mometasone, naphazoline-pheniramine, olmesartan, omeprazole, ondansetron , pramipexole, senna, senna-docusate, and cyanocobalamin. Her primarily  concern today is the Leg Pain (B/L)   Initial Vital Signs:  Pulse Rate: (!) 105 Temp: (!) 97.2 F (36.2 C) Resp: 16 BP: 134/68 SpO2: 99 %  BMI: Estimated body mass index is 30.85 kg/m as calculated from the following:   Height as of this encounter: 5' 7 (1.702 m).   Weight as of this encounter: 197 lb (89.4 kg).  Risk Assessment: Allergies: Reviewed. She is allergic to zanaflex [tizanidine], celebrex [celecoxib], codeine, etodolac, hydrochlorothiazide, oxycodone , pravastatin, relafen [nabumetone], and tramadol .  Allergy Precautions: None required Coagulopathies: Reviewed. None identified.  Blood-thinner therapy: None at this time Active Infection(s): Reviewed. None identified. Erika Crawford is afebrile  Site Confirmation: Erika Crawford was asked to confirm the procedure and laterality before marking the site Procedure checklist: Completed Consent: Before the procedure and under the influence of no sedative(s), amnesic(s), or anxiolytics, the patient was informed of the treatment options, risks and possible complications. To fulfill our ethical and legal obligations, as recommended by the American Medical Association's Code of Ethics, I have informed the patient of my clinical impression; the nature and purpose of the treatment or procedure; the risks, benefits, and possible complications of the intervention; the alternatives, including doing nothing; the risk(s) and benefit(s) of the alternative treatment(s) or procedure(s); and the risk(s) and benefit(s) of doing nothing. The patient was provided information about the general risks and possible complications associated with the procedure. These may include, but are not limited to: failure to achieve desired goals, infection, bleeding, organ or nerve damage, allergic reactions, paralysis, and death. In addition, the patient was informed of those risks and complications associated to Spine-related procedures, such as failure to decrease pain;  infection (i.e.: Meningitis, epidural or intraspinal abscess); bleeding (i.e.: epidural hematoma, subarachnoid hemorrhage, or any other type of intraspinal or peri-dural bleeding); organ or nerve damage (i.e.: Any type of peripheral nerve, nerve root, or spinal cord injury) with subsequent damage to sensory, motor, and/or autonomic systems,  resulting in permanent pain, numbness, and/or weakness of one or several areas of the body; allergic reactions; (i.e.: anaphylactic reaction); and/or death. Furthermore, the patient was informed of those risks and complications associated with the medications. These include, but are not limited to: allergic reactions (i.e.: anaphylactic or anaphylactoid reaction(s)); adrenal axis suppression; blood sugar elevation that in diabetics may result in ketoacidosis or comma; water retention that in patients with history of congestive heart failure may result in shortness of breath, pulmonary edema, and decompensation with resultant heart failure; weight gain; swelling or edema; medication-induced neural toxicity; particulate matter embolism and blood vessel occlusion with resultant organ, and/or nervous system infarction; and/or aseptic necrosis of one or more joints. Finally, the patient was informed that Medicine is not an exact science; therefore, there is also the possibility of unforeseen or unpredictable risks and/or possible complications that may result in a catastrophic outcome. The patient indicated having understood very clearly. We have given the patient no guarantees and we have made no promises. Enough time was given to the patient to ask questions, all of which were answered to the patient's satisfaction. Erika Crawford has indicated that she wanted to continue with the procedure. Attestation: I, the ordering provider, attest that I have discussed with the patient the benefits, risks, side-effects, alternatives, likelihood of achieving goals, and potential problems during  recovery for the procedure that I have provided informed consent. Date  Time:   Pre-Procedure Preparation:  Monitoring: As per clinic protocol. Respiration, ETCO2, SpO2, BP, heart rate and rhythm monitor placed and checked for adequate function Safety Precautions: Patient was assessed for positional comfort and pressure points before starting the procedure. Time-out: I initiated and conducted the Time-out before starting the procedure, as per protocol. The patient was asked to participate by confirming the accuracy of the Time Out information. Verification of the correct person, site, and procedure were performed and confirmed by me, the nursing staff, and the patient. Time-out conducted as per Joint Commission's Universal Protocol (UP.01.01.01). Time: 1409  Description of Procedure:       Position: Prone with head of the table was raised to facilitate breathing. Target Area: The interlaminar space, initially targeting the lower laminar border of the superior vertebral body. Approach: Paramedial approach. Area Prepped: Entire Posterior Lumbar Region Prepping solution: ChloraPrep (2% chlorhexidine  gluconate and 70% isopropyl alcohol) Safety Precautions: Aspiration looking for blood return was conducted prior to all injections. At no point did we inject any substances, as a needle was being advanced. No attempts were made at seeking any paresthesias. Safe injection practices and needle disposal techniques used. Medications properly checked for expiration dates. SDV (single dose vial) medications used. Description of the Procedure: Protocol guidelines were followed. The procedure needle was introduced through the skin, ipsilateral to the reported pain, and advanced to the target area. Bone was contacted and the needle walked caudad, until the lamina was cleared. The epidural space was identified using "loss-of-resistance technique" with 2-3 ml of PF-NaCl (0.9% NSS), in a 5cc LOR glass  syringe. Vitals:   12/22/23 1351 12/22/23 1405 12/22/23 1410 12/22/23 1412  BP: 134/68 (!) 179/87 (!) 158/76 135/70  Pulse: (!) 105     Resp: 16 20 (!) 22 19  Temp: (!) 97.2 F (36.2 C)     SpO2: 99% 98% 99% 100%  Weight: 197 lb (89.4 kg)     Height: 5' 7 (1.702 m)        Start Time: 1409 hrs. End Time: 1412 hrs. Materials:  Needle(s)  Type: Epidural needle Gauge: 22G Length: 3.5-in Medication(s): Please see orders for medications and dosing details. 6CC solution made of 3 cc of preservative-free saline, 2 cc of 0.2% ropivacaine , 1 cc of Decadron  10 mg per cc Imaging Guidance (Spinal):  Type of Imaging Technique: Fluoroscopy Guidance (Spinal) Indication(s): Assistance in needle guidance and placement for procedures requiring needle placement in or near specific anatomical locations not easily accessible without such assistance. Exposure Time: Please see nurses notes. Contrast: Before injecting any contrast, we confirmed that the patient did not have an allergy to iodine, shellfish, or radiological contrast. Once satisfactory needle placement was completed at the desired level, radiological contrast was injected. Contrast injected under live fluoroscopy. No contrast complications. See chart for type and volume of contrast used. Fluoroscopic Guidance: I was personally present during the use of fluoroscopy. Tunnel Vision Technique used to obtain the best possible view of the target area. Parallax error corrected before commencing the procedure. Direction-depth-direction technique used to introduce the needle under continuous pulsed fluoroscopy. Once target was reached, antero-posterior, oblique, and lateral fluoroscopic projection used confirm needle placement in all planes. Images permanently stored in EMR. Interpretation: I personally interpreted the imaging intraoperatively. Adequate needle placement confirmed in multiple planes. Appropriate spread of contrast into desired area was  observed. No evidence of afferent or efferent intravascular uptake. No intrathecal or subarachnoid spread observed. Permanent images saved into the patient's record.  Post-operative Assessment:  Post-procedure Vital Signs:  Pulse Rate: (!) 105 Temp: (!) 97.2 F (36.2 C) Resp: 19 BP: 135/70 SpO2: 100 %  EBL: None  Complications: No immediate post-treatment complications observed by team, or reported by patient.  Note: The patient tolerated the entire procedure well. A repeat set of vitals were taken after the procedure and the patient was kept under observation following institutional policy, for this type of procedure. Post-procedural neurological assessment was performed, showing return to baseline, prior to discharge. The patient was provided with post-procedure discharge instructions, including a section on how to identify potential problems. Should any problems arise concerning this procedure, the patient was given instructions to immediately contact us , at any time, without hesitation. In any case, we plan to contact the patient by telephone for a follow-up status report regarding this interventional procedure.  Comments:  No additional relevant information.  5 out of 5 strength bilateral lower extremity: Plantar flexion, dorsiflexion, knee flexion, knee extension.  Plan of Care   Imaging Orders         DG PAIN CLINIC C-ARM 1-60 MIN NO REPORT      Procedure Orders    No procedure(s) ordered today    Medications ordered for procedure: Meds ordered this encounter  Medications   iohexol  (OMNIPAQUE ) 180 MG/ML injection 10 mL    Must be Myelogram-compatible. If not available, you may substitute with a water-soluble, non-ionic, hypoallergenic, myelogram-compatible radiological contrast medium.   lidocaine  (XYLOCAINE ) 2 % (with pres) injection 400 mg   ropivacaine  (PF) 2 mg/mL (0.2%) (NAROPIN ) injection 2 mL   sodium chloride  flush (NS) 0.9 % injection 2 mL   dexamethasone   (DECADRON ) injection 10 mg    Medications administered: We administered iohexol , lidocaine , ropivacaine  (PF) 2 mg/mL (0.2%), sodium chloride  flush, and dexamethasone .  See the medical record for exact dosing, route, and time of administration.  New Prescriptions   No medications on file   Disposition: Discharge home  Discharge Date & Time: 12/22/2023; 1415 hrs.   Physician-requested Follow-up: Return in about 6 weeks (around 02/02/2024) for PPE, VV.  No future appointments.

## 2023-12-22 NOTE — Patient Instructions (Signed)

## 2024-01-02 ENCOUNTER — Other Ambulatory Visit: Payer: Self-pay | Admitting: Internal Medicine

## 2024-01-02 DIAGNOSIS — M898X5 Other specified disorders of bone, thigh: Secondary | ICD-10-CM

## 2024-01-02 DIAGNOSIS — M5416 Radiculopathy, lumbar region: Secondary | ICD-10-CM

## 2024-01-06 ENCOUNTER — Ambulatory Visit
Admission: RE | Admit: 2024-01-06 | Discharge: 2024-01-06 | Disposition: A | Discharge status: Home / Self Care | Source: Ambulatory Visit | Attending: Internal Medicine | Admitting: Internal Medicine

## 2024-01-06 DIAGNOSIS — M898X5 Other specified disorders of bone, thigh: Secondary | ICD-10-CM | POA: Insufficient documentation

## 2024-01-06 DIAGNOSIS — M5416 Radiculopathy, lumbar region: Secondary | ICD-10-CM | POA: Diagnosis present

## 2024-01-21 ENCOUNTER — Ambulatory Visit
Attending: Student in an Organized Health Care Education/Training Program | Admitting: Student in an Organized Health Care Education/Training Program

## 2024-01-21 DIAGNOSIS — G894 Chronic pain syndrome: Secondary | ICD-10-CM | POA: Diagnosis not present

## 2024-01-21 DIAGNOSIS — M5416 Radiculopathy, lumbar region: Secondary | ICD-10-CM | POA: Diagnosis not present

## 2024-01-21 DIAGNOSIS — M48061 Spinal stenosis, lumbar region without neurogenic claudication: Secondary | ICD-10-CM | POA: Diagnosis not present

## 2024-01-21 DIAGNOSIS — M48062 Spinal stenosis, lumbar region with neurogenic claudication: Secondary | ICD-10-CM

## 2024-01-21 NOTE — Progress Notes (Signed)
 PROVIDER NOTE: Interpretation of information contained herein should be left to medically-trained personnel. Specific patient instructions are provided elsewhere under Patient Instructions section of medical record. This document was created in part using AI and STT-dictation technology, any transcriptional errors that may result from this process are unintentional.  Patient: Erika Crawford  Service: E/M   PCP: Erika Bail, MD  DOB: 05-Sep-1940  DOS: 01/21/2024  Provider: [name], MD  MRN: [id number]  Delivery: Virtual Visit  Specialty: Interventional Pain Management  Type: Established Patient  Setting: Ambulatory outpatient facility  Specialty designation: 09  Referring Prov.: Erika Bail, MD  Location: Remote location       Virtual Encounter - Pain Management PROVIDER NOTE: Information contained herein reflects review and annotations entered in association with encounter. Interpretation of such information and data should be left to medically-trained personnel. Information provided to patient can be located elsewhere in the medical record under Patient Instructions. Document created using STT-dictation technology, any transcriptional errors that may result from process are unintentional.    Contact & Pharmacy Preferred: [phone] Home: [phone] (home) Mobile: [phone] (mobile) E-mail: No e-mail address on record  [id number] - [place], [place] - [address] AT [facility] OF [address] [place] [address] Phone: [phone] Fax: [phone]   Pre-screening  Erika Crawford offered in-person vs virtual encounter. She indicated preferring virtual for this encounter.   Reason COVID-19*  Social distancing based on CDC and AMA recommendations.   I contacted Erika Crawford on 01/21/2024 via telephone.      I clearly identified myself as Erika Sherry, MD. I verified that I was speaking with the correct person using two  identifiers (Name: Erika Crawford, and date of birth: 1940/08/03).  Consent I sought verbal advanced consent from Erika Crawford for virtual visit interactions. I informed Erika Crawford of possible security and privacy concerns, risks, and limitations associated with providing not-in-person medical evaluation and management services. I also informed Erika Crawford of the availability of in-person appointments. Finally, I informed her that there would be a charge for the virtual visit and that she could be  personally, fully or partially, financially responsible for it. Erika Crawford expressed understanding and agreed to proceed.   Historic Elements   Erika Crawford is a 83 y.o. year old, female patient evaluated today after our last contact on 12/22/2023. Erika Crawford  has a past medical history of Arthritis, Complication of anesthesia, Hypertension, Hypothyroidism, Sleep apnea, and Thyroid disease. 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 Erika Crawford has a current medication list which includes the following prescription(s): amlodipine , lidocaine , cholecalciferol , cyanocobalamin, cyclobenzaprine, diltiazem, diltiazem, fluticasone , gabapentin , homeopathic products, hydrocodone -acetaminophen , levothyroxine , levothyroxine , linaclotide, magnesium , magnesium  gluconate, magnesium  oxide, menthol (topical analgesic), menthol (topical analgesic), mometasone, naphazoline-pheniramine, olmesartan, omeprazole, ondansetron , pramipexole, senna, senna-docusate, and cyanocobalamin. She  reports that she has quit smoking. She has never used smokeless tobacco. She reports current alcohol use. She reports that she does not use drugs. Erika Crawford is allergic to other, zanaflex  [tizanidine], celebrex [celecoxib], codeine, etodolac, hydrochlorothiazide, oxycodone , pravastatin, relafen [nabumetone], and tramadol .  BMI: Estimated body mass index is 30.85 kg/m as calculated from the following:   Height as of 12/22/23: 5' 7 (1.702 m).   Weight as of 12/22/23: 197 lb (  89.4 kg). Last encounter: 11/12/2023. Last procedure: 12/22/2023.  HPI  Today, she is being contacted for PPE  Post-Procedure Evaluation Type: Therapeutic Inter-Laminar Epidural Steroid Injection #2 FOR 2025 Region: Lumbar Level: L5-S1 Level. Laterality: Left-Sided   Effectiveness:  Initial hour after procedure: 0 %  Subsequent 4-6 hours post-procedure: 0 %  Analgesia past initial 6 hours: 0 %  Ongoing improvement:  Analgesic:  0%    No results found for: MABLE OYSTER, [id number]  Laboratory Chemistry Profile   Renal Lab Results  Component Value Date   BUN 20 05/28/2022   CREATININE 0.67 05/28/2022   GFRAA >60 09/17/2019   GFRNONAA >60 05/28/2022    Hepatic Lab Results  Component Value Date   AST 16 01/19/2014   ALT 25 01/19/2014   ALBUMIN 3.6 01/19/2014   ALKPHOS 62 01/19/2014    Electrolytes Lab Results  Component Value Date   NA 138 05/28/2022   K 3.8 05/28/2022   CL 109 05/28/2022   CALCIUM  9.1 05/28/2022    Bone No results found for: VD25OH, VD125OH2TOT, CI6874NY7, CI7874NY7, 25OHVITD1, 25OHVITD2, 25OHVITD3, TESTOFREE, TESTOSTERONE  Inflammation (CRP: Acute Phase) (ESR: Chronic Phase) No results found for: CRP, ESRSEDRATE, LATICACIDVEN       Note: Above Lab results reviewed.  Imaging  MR FEMUR LEFT WO CONTRAST CLINICAL DATA:  Left leg pain radiating down to the foot with numbness in the foot for several months.  EXAM: MR OF THE LEFT FEMUR WITHOUT CONTRAST  TECHNIQUE: Multiplanar, multisequence MR imaging of the left femur was performed. No intravenous contrast was administered.  COMPARISON:   04/13/2020  FINDINGS: Bones/Joint/Cartilage  Previous prior femoral stress fracture shown on 04/13/2020 has completely resolved.  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  No hip joint effusion.  Incidental note is made of a contralateral (right) knee joint effusion in the suprapatellar bursa.  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  Soft tissues  No impingement at the sciatic notch or along the sciatic nerve. No significant lesion in the subcutaneous tissues.  IMPRESSION: 1. 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 2. No impingement at the sciatic notch or along the sciatic nerve. 3. Incidental note is made of a contralateral (right) knee joint effusion in the suprapatellar bursa.  Electronically Signed   By: Ryan Salvage M.D.   On: 01/16/2024 11:40  Assessment  The primary encounter diagnosis was Lumbar radiculopathy. Diagnoses of Lumbar foraminal stenosis, Spinal stenosis, lumbar region, with neurogenic claudication, and Chronic pain syndrome were also pertinent to this visit.  Plan of Care  Patient having severe bilateral leg pain, likely secondary to severe lumbar spinal stenosis at L4/5 which has worsened  Previous L-ESI was not effective Recommend PT for LSS with [place] Follow up in 4 weeks to assess response and potentially discuss MILD  Follow-up plan:   Return in about 4 weeks (around 02/18/2024) for PPE, F2F.      Previous lumbar epidurals have been very helpful in managing her lumbar radicular pain every 3 to every 4 months previously done on 01/04/2019.  status post SI joint injection 04/05/2019-not effective, repeat ESI prn.           Recent Visits Date Type Provider Dept  12/22/23 Procedure visit Marcelino Nurse, MD [facility]  12/04/23 Procedure visit Patel, Seema K, NP [facility]  11/12/23 Office Visit Marcelino Nurse, MD [facility]  Showing recent visits within past 90 days and meeting all other requirements Today's Visits Date Type Provider Dept  01/21/24 Office Visit Marcelino Nurse, MD [facility]  Showing today's visits and meeting all other requirements Future Appointments No visits were found meeting these conditions. Showing future appointments within next 90 days and meeting all other requirements  I discussed the assessment and treatment plan with the patient. The patient was provided an opportunity to ask questions and all were answered. The patient agreed with the plan and demonstrated an understanding of the instructions.  Patient advised to call back or seek an in-person evaluation if the symptoms or condition worsens.  Duration of encounter: .  Note by: Nurse Marcelino, MD Date: 01/21/2024; Time: 3:59 PM

## 2024-01-22 ENCOUNTER — Telehealth: Payer: Self-pay

## 2024-01-22 NOTE — Telephone Encounter (Signed)
 She had a MRI for her back last month and she wants to make sure Dr. Marcelino to take a look at it and see what he thinks and what can be done

## 2024-02-02 ENCOUNTER — Telehealth: Admitting: Student in an Organized Health Care Education/Training Program

## 2024-02-03 ENCOUNTER — Telehealth: Payer: Self-pay | Admitting: Student in an Organized Health Care Education/Training Program

## 2024-02-03 NOTE — Telephone Encounter (Signed)
 Please call patient to schedule appt

## 2024-02-03 NOTE — Telephone Encounter (Signed)
 PT called stated that she is in a lot of pain in her left leg. PT stated that she has been in pain since the procedure. PT has a appt schedule for next month. Will to see if she can be seen before than. PT also stated that it hurts when she sits. Please give patient a call. TY

## 2024-02-04 ENCOUNTER — Encounter: Payer: Self-pay | Admitting: Nurse Practitioner

## 2024-02-04 ENCOUNTER — Ambulatory Visit: Attending: Nurse Practitioner | Admitting: Nurse Practitioner

## 2024-02-04 VITALS — BP 137/68 | Temp 97.9°F | Ht 67.0 in | Wt 197.0 lb

## 2024-02-04 DIAGNOSIS — M5416 Radiculopathy, lumbar region: Secondary | ICD-10-CM | POA: Insufficient documentation

## 2024-02-04 DIAGNOSIS — M48061 Spinal stenosis, lumbar region without neurogenic claudication: Secondary | ICD-10-CM | POA: Diagnosis not present

## 2024-02-04 DIAGNOSIS — M48062 Spinal stenosis, lumbar region with neurogenic claudication: Secondary | ICD-10-CM | POA: Insufficient documentation

## 2024-02-04 DIAGNOSIS — G894 Chronic pain syndrome: Secondary | ICD-10-CM | POA: Insufficient documentation

## 2024-02-04 MED ORDER — KETOROLAC TROMETHAMINE 60 MG/2ML IM SOLN
60.0000 mg | Freq: Once | INTRAMUSCULAR | Status: AC
  Administered 2024-02-04: 60 mg via INTRAMUSCULAR

## 2024-02-04 MED ORDER — KETOROLAC TROMETHAMINE 60 MG/2ML IM SOLN
INTRAMUSCULAR | Status: AC
  Filled 2024-02-04: qty 2

## 2024-02-04 MED ORDER — METHOCARBAMOL 1000 MG/10ML IJ SOLN
200.0000 mg | Freq: Once | INTRAMUSCULAR | Status: AC
  Administered 2024-02-04: 200 mg via INTRAMUSCULAR
  Filled 2024-02-04: qty 10

## 2024-02-04 NOTE — Progress Notes (Signed)
 Safety precautions to be maintained throughout the outpatient stay will include: orient to surroundings, keep bed in low position, maintain call bell within reach at all times, provide assistance with transfer out of bed and ambulation.

## 2024-02-04 NOTE — Progress Notes (Signed)
 PROVIDER NOTE: Interpretation of information contained herein should be left to medically-trained personnel. Specific patient instructions are provided elsewhere under Patient Instructions section of medical record. This document was created in part using AI and STT-dictation technology, any transcriptional errors that may result from this process are unintentional.  Patient: Erika Crawford  Service: E/M   PCP: Sherial Bail, MD  DOB: November 25, 1940  DOS: 02/04/2024  Provider: Emmy MARLA Blanch, NP  MRN: [id number]  Delivery: Face-to-face  Specialty: Interventional Pain Management  Type: Established Patient  Setting: Ambulatory outpatient facility  Specialty designation: 09  Referring Prov.: Sherial Bail, MD  Location: Outpatient office facility       History of present illness (HPI) Erika Crawford, a 83 y.o. year old female, is here today because of her Lumbar radiculopathy [M54.16]. Erika Crawford primary complain today is Leg Pain (left)  Pertinent problems: Erika Crawford does not have any pertinent problems on file.  Pain Assessment: Severity of Chronic pain is reported as a 10-Worst pain ever/10. Location: Leg Left/pain radiaities down her left leg. Onset: More than a month ago. Quality: Aching, Burning, Sharp. Timing: Constant. Modifying factor(s): meds and rest. Vitals:  height is 5' 7 (1.702 m) and weight is 197 lb (89.4 kg). Her temperature is 97.9 F (36.6 C). Her blood pressure is 137/68. Her oxygen saturation is 96%.  BMI: Estimated body mass index is 30.85 kg/m as calculated from the following:   Height as of this encounter: 5' 7 (1.702 m).   Weight as of this encounter: 197 lb (89.4 kg).  Last encounter: Visit date not found. Last procedure: 12/04/2023.  Reason for encounter: evaluation of worsening, or previously known (established) problem.  The patient continues to struggle with persistent bilateral leg pain, likely secondary to severe lumbar spinal stenosis, which  has progressively worsened and remains refractory to treatment.  She presents today to discuss potential options, including IM pain injection versus medication management, to improve her pain control and functional status.  We discussed tramadol  and Robaxin  IM injection for pain relief, and the patient agrees to proceed.  Pharmacotherapy Assessment   Monitoring: [place] PMP: PDMP reviewed during this encounter.       Pharmacotherapy: No side-effects or adverse reactions reported. Compliance: No problems identified. Effectiveness: Clinically acceptable.  Erika Dorothe LABOR, RN  02/04/2024  1:44 PM  Sign when Signing Visit Safety precautions to be maintained throughout the outpatient stay will include: orient to surroundings, keep bed in low position, maintain call bell within reach at all times, provide assistance with transfer out of bed and ambulation.     UDS:  Summary  Date Value Ref Range Status  07/30/2018 FINAL  Final    Comment:    ==================================================================== [facility] DRUG ANALYSIS,[facility] ==================================================================== Test                             Result       Flag       Units Drug Present and Declared for Prescription Verification   Gabapentin                      PRESENT      EXPECTED   Acetaminophen                   PRESENT      EXPECTED   Naproxen  PRESENT      EXPECTED   Lidocaine                       PRESENT      EXPECTED Drug Absent but Declared for Prescription Verification   Hydrocodone                     Not Detected UNEXPECTED ng/mg creat ==================================================================== Test                      Result    Flag   Units      Ref Range   Creatinine              272              mg/dL      >=79 ==================================================================== Declared Medications:  The flagging and interpretation on this report are  based on the  following declared medications.  Unexpected results may arise from  inaccuracies in the declared medications.  **Note: The testing scope of this panel includes these medications:  Gabapentin   Hydrocodone  (Hydrocodone -Acetaminophen )  Naproxen  **Note: The testing scope of this panel does not include small to  moderate amounts of these reported medications:  Acetaminophen   Acetaminophen  (Hydrocodone -Acetaminophen )  Lidocaine   **Note: The testing scope of this panel does not include following  reported medications:  Alendronate (Fosamax)  Amlodipine  (Norvasc )  Cholecalciferol   Levothyroxine   Mometasone  Olmesartan (Benicar)  Omeprazole (Prilosec) ==================================================================== For clinical consultation, please call [phone]. ====================================================================     No results found for: CBDTHCR No results found for: D8THCCBX No results found for: D9THCCBX  ROS  Constitutional: Denies any fever or chills Gastrointestinal: No reported hemesis, hematochezia, vomiting, or acute GI distress Musculoskeletal: Denies any acute onset joint swelling, redness, loss of ROM, or weakness Neurological: No reported episodes of acute onset apraxia, aphasia, dysarthria, agnosia, amnesia, paralysis, loss of coordination, or loss of consciousness  Medication Review  HYDROcodone -acetaminophen , Homeopathic Products, Lidocaine , Magnesium , Menthol (Topical Analgesic), Naphazoline-Pheniramine, amLODipine , cholecalciferol , cyanocobalamin, cyclobenzaprine, diltiazem, fluticasone , gabapentin , levothyroxine , linaclotide, magnesium  gluconate, magnesium  oxide, mometasone, olmesartan, omeprazole, ondansetron , pramipexole, senna, and senna-docusate  History Review  Allergy: Ms. Scherr is allergic to other, zanaflex [tizanidine], celebrex [celecoxib], codeine, etodolac, hydrochlorothiazide, oxycodone , pravastatin,  relafen [nabumetone], and tramadol . Drug: Ms. Sakuma  reports no history of drug use. Alcohol:  reports current alcohol use. Tobacco:  reports that she has quit smoking. She has never used smokeless tobacco. Social: Ms. Minish  reports that she has quit smoking. She has never used smokeless tobacco. She reports current alcohol use. She reports that she does not use drugs. Medical:  has a past medical history of Arthritis, Complication of anesthesia, Hypertension, Hypothyroidism, Sleep apnea, and Thyroid disease. Surgical: Ms. 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 Family: family history is not on file.  Laboratory Chemistry Profile   Renal Lab Results  Component Value Date   BUN 20 05/28/2022   CREATININE 0.67 05/28/2022   GFRAA >60 09/17/2019   GFRNONAA >60 05/28/2022    Hepatic Lab Results  Component Value Date   AST 16 01/19/2014   ALT 25 01/19/2014   ALBUMIN 3.6 01/19/2014   ALKPHOS 62 01/19/2014    Electrolytes Lab Results  Component Value Date   NA 138 05/28/2022   K  3.8 05/28/2022   CL 109 05/28/2022   CALCIUM  9.1 05/28/2022    Bone No results found for: VD25OH, VD125OH2TOT, CI6874NY7, CI7874NY7, 25OHVITD1, 25OHVITD2, 25OHVITD3, TESTOFREE, TESTOSTERONE  Inflammation (CRP: Acute Phase) (ESR: Chronic Phase) No results found for: CRP, ESRSEDRATE, LATICACIDVEN       Note: Above Lab results reviewed.  Recent Imaging Review  MR FEMUR LEFT WO CONTRAST CLINICAL DATA:  Left leg pain radiating down to the foot with numbness in the foot for several months.  EXAM: MR OF THE LEFT FEMUR WITHOUT CONTRAST  TECHNIQUE: Multiplanar, multisequence MR imaging of the left femur was performed. No intravenous  contrast was administered.  COMPARISON:  04/13/2020  FINDINGS: Bones/Joint/Cartilage  Previous prior femoral stress fracture shown on 04/13/2020 has completely resolved.  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  No hip joint effusion.  Incidental note is made of a contralateral (right) knee joint effusion in the suprapatellar bursa.  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  Soft tissues  No impingement at the sciatic notch or along the sciatic nerve. No significant lesion in the subcutaneous tissues.  IMPRESSION: 1. 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 2. No impingement at the sciatic notch or along the sciatic nerve. 3. Incidental note is made of a contralateral (right) knee joint effusion in the suprapatellar bursa.  Electronically Signed   By: Ryan Salvage M.D.   On: 01/16/2024 11:40 Note: Reviewed        Physical Exam  Vitals: BP 137/68   Temp 97.9 F (36.6 C)   Ht 5' 7 (1.702 m)   Wt 197 lb (89.4 kg)   SpO2 96%   BMI 30.85 kg/m  BMI: Estimated body mass index is 30.85 kg/m as calculated from the following:   Height as of this encounter: 5' 7 (1.702 m).   Weight as of this encounter: 197 lb (89.4 kg). Ideal: Ideal body weight: 61.6 kg (135 lb 12.9 oz) Adjusted ideal body weight: 72.7 kg (160 lb 4.5 oz) General appearance: Well nourished, well developed, and well hydrated. In no apparent acute distress Mental status: Alert, oriented x 3 (person, place, & time)       Respiratory: No evidence of acute respiratory distress Eyes: PERLA   Assessment   Diagnosis Status  1. Lumbar radiculopathy   2. Chronic pain syndrome   3. Lumbar foraminal stenosis   4. Spinal stenosis, lumbar region, with neurogenic  claudication    Controlled Controlled Controlled   Updated Problems: No problems updated.  Plan of Care  Problem-specific:  Assessment and Plan  IM therapy at the clinic: Torodol 60 mg Robaxin  200 mg   [name] has a current medication list which includes the following long-term medication(s): diltiazem, diltiazem, gabapentin , levothyroxine , levothyroxine , linaclotide, mometasone, olmesartan, and omeprazole.  Pharmacotherapy (Medications Ordered): Meds ordered this encounter  Medications   ketorolac  (TORADOL ) injection 60 mg   methocarbamol  (ROBAXIN ) injection 200 mg   Orders:  No orders of the defined types were placed in this encounter.       No follow-ups on file.    Recent Visits Date Type Provider Dept  01/21/24 Office Visit Marcelino Nurse, MD [facility]  12/22/23 Procedure visit Marcelino Nurse, MD [facility]  12/04/23 Procedure visit Mialani Reicks K, NP [facility]  11/12/23 Office Visit  Marcelino Nurse, MD [facility]  Showing recent visits within past 90 days and meeting all other requirements Today's Visits Date Type Provider Dept  02/04/24 Office Visit Kennedee Kitzmiller K, NP [facility]  Showing today's visits and meeting all other requirements Future Appointments Date Type Provider Dept  02/10/24 Appointment Marcelino Nurse, MD [facility]  02/26/24 Appointment Marcelino Nurse, MD [facility]  Showing future appointments within next 90 days and meeting all other requirements  I discussed the assessment and treatment plan with the patient. The patient was provided an opportunity to ask questions and all were answered. The patient agreed with the plan and demonstrated an understanding of the instructions.  Patient advised to call back or seek an in-person evaluation if the symptoms or condition worsens.  Duration of encounter: 20 minutes.  Total time on encounter, as per AMA  guidelines included both the face-to-face and non-face-to-face time personally spent by the physician and/or other qualified health care professional(s) on the day of the encounter (includes time in activities that require the physician or other qualified health care professional and does not include time in activities normally performed by clinical staff). Physician's time may include the following activities when performed: Preparing to see the patient (e.g., pre-charting review of records, searching for previously ordered imaging, lab work, and nerve conduction tests) Review of prior analgesic pharmacotherapies. Reviewing PMP Interpreting ordered tests (e.g., lab work, imaging, nerve conduction tests) Performing post-procedure evaluations, including interpretation of diagnostic procedures Obtaining and/or reviewing separately obtained history Performing a medically appropriate examination and/or evaluation Counseling and educating the patient/family/caregiver Ordering medications, tests, or procedures Referring and communicating with other health care professionals (when not separately reported) Documenting clinical information in the electronic or other health record Independently interpreting results (not separately reported) and communicating results to the patient/ family/caregiver Care coordination (not separately reported)  Note by: Norvell Caswell K Elease Swarm, NP (TTS and AI technology used. I apologize for any typographical errors that were not detected and corrected.) Date: 02/04/2024; Time: 3:03 PM

## 2024-02-10 ENCOUNTER — Ambulatory Visit
Attending: Student in an Organized Health Care Education/Training Program | Admitting: Student in an Organized Health Care Education/Training Program

## 2024-02-10 DIAGNOSIS — G894 Chronic pain syndrome: Secondary | ICD-10-CM | POA: Diagnosis not present

## 2024-02-10 DIAGNOSIS — M48061 Spinal stenosis, lumbar region without neurogenic claudication: Secondary | ICD-10-CM

## 2024-02-10 DIAGNOSIS — M5416 Radiculopathy, lumbar region: Secondary | ICD-10-CM

## 2024-02-10 MED ORDER — METHYLPREDNISOLONE 4 MG PO TBPK: ORAL_TABLET | ORAL | 0 refills | Status: AC

## 2024-02-10 NOTE — Progress Notes (Signed)
 PROVIDER NOTE: Interpretation of information contained herein should be left to medically-trained personnel. Specific patient instructions are provided elsewhere under Patient Instructions section of medical record. This document was created in part using AI and STT-dictation technology, any transcriptional errors that may result from this process are unintentional.  Patient: Erika Crawford  Service: E/M   PCP: Sherial Bail, MD  DOB: [birth year]  DOS: 02/10/2024  Provider: [name], MD  MRN: [id number]  Delivery: Virtual Visit  Specialty: Interventional Pain Management  Type: Established Patient  Setting: Ambulatory outpatient facility  Specialty designation: 09  Referring Prov.: Sherial Bail, MD  Location: Remote location       Virtual Encounter - Pain Management PROVIDER NOTE: Information contained herein reflects review and annotations entered in association with encounter. Interpretation of such information and data should be left to medically-trained personnel. Information provided to patient can be located elsewhere in the medical record under Patient Instructions. Document created using STT-dictation technology, any transcriptional errors that may result from process are unintentional.    Contact & Pharmacy Preferred: [phone] Home: [phone] (home) Mobile: [phone] (mobile) E-mail: No e-mail address on record  [id number] - [place], [place] - [address] AT [facility] [address] [place] [address] [place] [address] Phone: [phone] Fax: [phone]   Pre-screening  Erika Crawford offered in-person vs virtual encounter. She indicated preferring virtual for this encounter.   Reason COVID-19*  Social distancing based on CDC and AMA recommendations.   I contacted Erika Crawford on 02/10/2024 via telephone.      I clearly identified myself as [name], MD. I verified that I was speaking with the correct person using two  identifiers (Name: Erika Crawford, and date of birth: [birth year]).  Consent I sought verbal advanced consent from Erika Crawford for virtual visit interactions. I informed Ms. Parsell of possible security and privacy concerns, risks, and limitations associated with providing not-in-person medical evaluation and management services. I also informed Ms. Severt of the availability of in-person appointments. Finally, I informed her that there would be a charge for the virtual visit and that she could be  personally, fully or partially, financially responsible for it. Ms. Sohail expressed understanding and agreed to proceed.   Historic Elements   [name] is a 83 y.o. year old, female patient evaluated today after our last contact on 02/03/2024. Erika Crawford  has a past medical history of Arthritis, Complication of anesthesia, Hypertension, Hypothyroidism, Sleep apnea, and Thyroid disease. 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 Ms. Mclarty has a current medication list which includes the following prescription(s): methylprednisolone , amlodipine , lidocaine , cholecalciferol , cyanocobalamin, cyclobenzaprine, diltiazem, diltiazem, fluticasone , gabapentin , homeopathic products, hydrocodone -acetaminophen , levothyroxine , levothyroxine , linaclotide, magnesium , magnesium  gluconate, magnesium  oxide, menthol (topical analgesic), menthol (topical analgesic), mometasone, naphazoline-pheniramine, olmesartan, omeprazole, ondansetron , pramipexole, senna, senna-docusate, and cyanocobalamin. She  reports that she has quit smoking. She has never used smokeless tobacco. She reports current alcohol use. She reports that she does not use drugs. Erika Crawford is allergic to  other, zanaflex [tizanidine], celebrex [celecoxib], codeine, etodolac, hydrochlorothiazide, oxycodone , pravastatin, relafen [nabumetone], and tramadol .  BMI: Estimated body mass index is 30.85 kg/m as calculated from the following:   Height as of 02/04/24: 5' 7 (1.702 m).   Weight as of 02/04/24: 197  lb (89.4 kg). Last encounter: 01/21/2024. Last procedure: 12/22/2023.  HPI  Today, she is being contacted for worsening of previously known (established) problem  Having increased lumbar radicular pain  No results found for: MABLE OYSTER, D9THCCBX  Laboratory Chemistry Profile   Renal Lab Results  Component Value Date   BUN 20 05/28/2022   CREATININE 0.67 05/28/2022   GFRAA >60 09/17/2019   GFRNONAA >60 05/28/2022    Hepatic Lab Results  Component Value Date   AST 16 01/19/2014   ALT 25 01/19/2014   ALBUMIN 3.6 01/19/2014   ALKPHOS 62 01/19/2014    Electrolytes Lab Results  Component Value Date   NA 138 05/28/2022   K 3.8 05/28/2022   CL 109 05/28/2022   CALCIUM  9.1 05/28/2022    Bone No results found for: VD25OH, VD125OH2TOT, CI6874NY7, CI7874NY7, 25OHVITD1, 25OHVITD2, 25OHVITD3, TESTOFREE, TESTOSTERONE  Inflammation (CRP: Acute Phase) (ESR: Chronic Phase) No results found for: CRP, ESRSEDRATE, LATICACIDVEN       Note: Above Lab results reviewed.  Imaging  MR FEMUR LEFT WO CONTRAST CLINICAL DATA:  Left leg pain radiating down to the foot with numbness in the foot for several months.  EXAM: MR OF THE LEFT FEMUR WITHOUT CONTRAST  TECHNIQUE: Multiplanar, multisequence MR imaging of the left femur was performed. No intravenous contrast was administered.  COMPARISON:  04/13/2020  FINDINGS: Bones/Joint/Cartilage  Previous prior femoral stress fracture shown on 04/13/2020 has completely resolved.  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  No hip joint effusion.  Incidental note is made of a  contralateral (right) knee joint effusion in the suprapatellar bursa.  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  Soft tissues  No impingement at the sciatic notch or along the sciatic nerve. No significant lesion in the subcutaneous tissues.  IMPRESSION: 1. 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 2. No impingement at the sciatic notch or along the sciatic nerve. 3. Incidental note is made of a contralateral (right) knee joint effusion in the suprapatellar bursa.  Electronically Signed   By: Ryan Salvage M.D.   On: 01/16/2024 11:40  Assessment  The primary encounter diagnosis was Lumbar foraminal stenosis. Diagnoses of Lumbar radiculopathy and Chronic pain syndrome were also pertinent to this visit.  Plan of Care  Patient states that she is having increased lumbar radicular pain that is making it very challenging for her to walk.  She is currently on hydrocodone  5 mg 3 times daily as needed as well as gabapentin  300 mg 3 times a day.  I recommend a Medrol  Dosepak as below.  She has an appointment already scheduled for September 11 at which point we will reevaluate.  Future considerations could include bilateral L4 transforaminal ESI versus referral to neurosurgery for evaluation for surgical decompression at L4-L5. Pharmacotherapy (Medications Ordered): Meds ordered this encounter  Medications   methylPREDNISolone  (MEDROL ) 4 MG TBPK tablet    Sig: Follow package instructions.    Dispense:  21 tablet    Refill:  0    Do not add to the Automatic Refill notification system.   Orders:  No orders of the defined types were placed in this encounter.  Follow-up plan:   Return for Keep sch. appt.      Previous lumbar epidurals have been very helpful in managing  her lumbar radicular pain every 3  to every 4 months previously done on 01/04/2019.  status post SI joint injection 04/05/2019-not effective, repeat ESI prn.           Recent Visits Date Type Provider Dept  02/04/24 Office Visit Patel, Seema K, NP [facility]  01/21/24 Office Visit Marcelino Nurse, MD [facility]  12/22/23 Procedure visit Marcelino Nurse, MD [facility]  12/04/23 Procedure visit Patel, Seema K, NP [facility]  11/12/23 Office Visit Marcelino Nurse, MD [facility]  Showing recent visits within past 90 days and meeting all other requirements Today's Visits Date Type Provider Dept  02/10/24 Office Visit Marcelino Nurse, MD [facility]  Showing today's visits and meeting all other requirements Future Appointments Date Type Provider Dept  02/26/24 Appointment Marcelino Nurse, MD [facility]  Showing future appointments within next 90 days and meeting all other requirements  I discussed the assessment and treatment plan with the patient. The patient was provided an opportunity to ask questions and all were answered. The patient agreed with the plan and demonstrated an understanding of the instructions.  Patient advised to call back or seek an in-person evaluation if the symptoms or condition worsens.  Duration of encounter: .  Note by: Nurse Marcelino, MD Date: 02/10/2024; Time: 3:29 PM

## 2024-02-17 NOTE — Progress Notes (Signed)
 Chief Complaint:   Chief Complaint  Patient presents with  . Follow-up    Subjective:   Erika Crawford is a 83 y.o. female in today for follow up today  History of Present Illness Erika Crawford is an 83 year old female who presents with persistent left leg pain   Known history of chronic low back pain following with the pain clinic.  Also has history of left hip pain, history of left femur lucency after bisphosphonates at which time she decided to get it managed conservatively.  Recently has been having bilateral leg pain worse in the left leg.  MRI of the lower back and the left femur done.  MRI of the lumbar spine showing worsening degenerative anterolisthesis with severe spinal canal stenosis and impingement on L5 nerves, chronic moderate to severe decompression of L2 status post augmentation.  And MRI of the left femur showing moderately atrophied gluteus maximus and tensor fascia lata which has been progressive but symmetric.  No other impingement noted.  She recently received an epidural injection for the low back pain in July.  Patient feels like her left leg pain has worsened since the epidural injection.  Pain goes all the way down anterior to lateral thigh to the lateral side of the left knee.  She has been on hydrocodone  1 tablet daily which has been gradually increased over the last couple of years to twice daily may be an extra dose in the afternoon.  She is status post left knee replacement surgery and recently received an injection in the right knee which has helped the right knee symptoms.  Son passed away yesterday secondary to cancer.  Patient is still very emotional today.  Known history of constipation from her pain medications for which she takes MiraLAX which has been helping her.  She is also on Mirapex at bedtime to help with restless legs.  She reports sleeping fairly well, although she acknowledges having good and bad days, particularly following the recent  loss of a loved one.   Current Outpatient Medications  Medication Sig Dispense Refill  . aspirin  81 MG EC tablet Take 81 mg by mouth once daily    . azelastine (ASTELIN) 137 mcg nasal spray Place 1 spray into both nostrils 2 (two) times daily 10 mL 1  . azelastine (OPTIVAR) 0.05 % ophthalmic solution as needed    . cholecalciferol  (VITAMIN D3) 1000 unit tablet Take 50 mcg by mouth once daily    . diclofenac (VOLTAREN) 1 % topical gel Apply 2 g topically 4 (four) times daily    . dilTIAZem (CARDIZEM CD) 120 MG XR capsule Take 1 capsule (120 mg total) by mouth once daily 90 capsule 3  . ezetimibe (ZETIA) 10 mg tablet TAKE 1 TABLET(10 MG) BY MOUTH DAILY 30 tablet 11  . fluticasone  propionate (FLONASE ) 50 mcg/actuation nasal spray SHAKE LIQUID AND USE 2 SPRAYS IN EACH NOSTRIL EVERY DAY 16 g 2  . gabapentin  (NEURONTIN ) 300 MG capsule TAKE 1 CAPSULE BY MOUTH IN THE MORNING AND 2 CAPSULES AT BEDTIME 90 capsule 2  . hydrALAZINE (APRESOLINE) 25 MG tablet TAKE 1 TABLET BY MOUTH TWICE DAILY FOR ELEVATED BLOOD PRESSURE SYMPTOMS 60 tablet 0  . HYDROcodone -acetaminophen  (NORCO) 5-325 mg tablet Take 1 tablet by mouth 3 (three) times daily Take 1 tablet twice daily and an extra one in the afternoon if needed for pain 90 tablet 0  . levothyroxine  (SYNTHROID ) 88 MCG tablet TAKE 1 TABLET BY MOUTH EVERY MORNING 30 TO  60 MINUTES BEFORE BREAKFAST(6:30 AM) ON AN EMPTY STOMACH AND WITH GLASS OF WATER 90 tablet 1  . MAGNESIUM  ORAL Take 500 mg by mouth Two tablets daily    . mometasone (NASONEX) 50 mcg/actuation nasal spray Place 2 sprays into both nostrils once daily    . olmesartan (BENICAR) 40 MG tablet TAKE 1 TABLET(40 MG) BY MOUTH DAILY 90 tablet 0  . omeprazole (PRILOSEC) 20 MG DR capsule TAKE 1 CAPSULE BY MOUTH EVERY DAY ON AN EMPTY STOMACH FIRST THING IN THE MORNING 90 capsule 1  . polyethylene glycol (MIRALAX) packet Take 17 g by mouth at bedtime as needed    . pramipexole (MIRAPEX) 1 MG tablet TAKE 1 TABLET BY  MOUTH AT BEDTIME AS NEEDED 90 tablet 1  . sennosides-docusate (SENOKOT-S) 8.6-50 mg tablet Take 1 tablet by mouth 2 (two) times daily as needed for Constipation    . cyanocobalamin (VITAMIN B12) 1000 MCG tablet Take 1,000 mcg by mouth once daily    . cyclobenzaprine (FLEXERIL) 10 MG tablet Take 1 tablet (10 mg total) by mouth 2 (two) times daily as needed for Muscle spasms 30 tablet 0  . lidocaine  (LIDODERM ) 5 % patch Place 1 patch onto the skin daily for 30 days Apply patch to the most painful area for up to 12 hours in a 24 hour period. 30 patch 0   No current facility-administered medications for this visit.    Allergies as of 02/17/2024 - Reviewed 02/17/2024  Allergen Reaction Noted  . Other Unknown and Other (See Comments) 08/09/2014  . Pravastatin Other (See Comments) 04/24/2015  . Codeine Nausea And Vomiting 10/27/2014  . Etodolac Nausea And Vomiting and Rash 04/18/2010  . Hydrochlorothiazide Other (See Comments) 08/17/2014  . Oxycodone  Nausea And Vomiting 08/09/2014  . Celebrex [celecoxib] Hives 07/23/2012  . Relafen [nabumetone] Unknown 11/04/2013  . Tramadol  Nausea and Vomiting 07/02/2010  . Zanaflex [tizanidine] Other (See Comments) 01/28/2017    Past Medical History:  Diagnosis Date  . Adenomatous polyps 2007   colonoscopy  . Allergic state   . Apnea   . Chickenpox   . GERD (gastroesophageal reflux disease)   . Hypertension   . Hypothyroid   . Lumbar disc disease   . Obesity   . Osteoarthritis    Lumbar disc disease, knees.  . Osteoporosis, post-menopausal    Lumbar compression fracture status post kyphoplasty.  Alendronate.  . Shingles 11/10/2022  . Sleep apnea    Obstructive sleep apnea on CPAP at 9 cm of water.    Past Surgical History:  Procedure Laterality Date  . COLONOSCOPY  07/05/2005   adenomatous polyp  . COLONOSCOPY  07/17/2010   Normal per Dr. Ora.  Repeat 07/18/2015.  . L2 compression fracture  02/01/2014  . COLONOSCOPY  09/21/2015   Normal  Colon/No Repeat/PYO  . Left TKA using all-cemented biomet Vangaurd system with 67.5mm PCR femur a 71mm tibial tray with a 10 mm E-Poly insert and a 34 x 8.5 mm all-poly 3 pegged domed patella  Left 03/21/2016   Dr.poggi   . REVERSE RIGHT TOTAL SHOULDER ARTHROPLASTY Right 08/25/2018   POGGI  . COLONOSCOPY  04/24/2020   Tubular adenomas/Repeat 40yrs/CTL  . CHOLECYSTECTOMY    . HYSTERECTOMY       Family History  Problem Relation Name Age of Onset  . Arthritis Mother    . High blood pressure (Hypertension) Mother    . Diverticulitis Mother    . Diabetes type II Father    . Arthritis Sister    .  Colon polyps Sister    . Rheum arthritis Cousin    . Colon cancer Neg Hx      Social History:  reports that she has quit smoking. Her smoking use included cigarettes. She has been exposed to tobacco smoke. She has never used smokeless tobacco. She reports that she does not drink alcohol and does not use drugs.  Results for orders placed or performed in visit on 02/03/24  Large Joint Injection: L knee   Narrative   Sharrie Barter, DO     02/03/2024  3:53 PM Large Joint Injection: L knee  Date/Time: 02/03/2024 2:00 PM  Performed by: Sharrie Barter, DO Authorized by: Sharrie Barter, DO   Procedure discussed: discussed risks, benefits, and alternatives   Consent Given by:  Patient Timeout: timeout called immediately prior to procedure   Prep: patient was prepped and draped in usual sterile fashion   Indications:  Pain Needle Size:  22 G Guidance: ultrasound   Location:  Knee Site:  L knee Topical skin anesthesia: obtained using ethyl chloride spray   Medications:  4 mL BUPivacaine  HCl 0.25 %; 4 mL lidocaine  1 %; 1 mL  betamethasone acetate-betamethasone sodium phosphate  6 mg/mL Outcome:  Tolerated well, no immediate complications Patient tolerance:  Patient tolerated the procedure well Instructions: patient was counseled as to the expected post injection  course, including the  possibility of temporary worsening of symptoms   patient was instructed as to concerning symptoms or signs and instructed  to contact the office if these should appear     *Note: Due to a large number of results and/or encounters for the requested time period, some results have not been displayed. A complete set of results can be found in Results Review.      ROS:  General: No fever, chills or recent illness. No change in weight HEENT: No change in vision or hearing. No pain or difficulty with swallowing Respiratory: No cough or shortness of breath CV:  No chest pain or palpitations GI:  No pain, dyspepsia or change in bowel habits GU:  No dysuria, frequency, or hesitancy MSK:  Low back pain, worsening pain shooting down the left lateral knee and the left lateral thigh. Neurological: No headaches, changes in mental status, loss of sensation or strength  Psychological: Emotional and depressed today because her son passed away yesterday.  Objective:   Body mass index is 30.26 kg/m.  BP 126/62   Pulse 106   Ht 170.2 cm (5' 7)   Wt 87.6 kg (193 lb 3.2 oz)   SpO2 97%   BMI 30.26 kg/m   General: WD/WN female, in no acute distress HEENT: Pupils equal and round, EOMI. oral mucosa moist.  Oropharynx clear. Neck: supple, trachea midline; no thyromegaly Respiratory:clear to auscultation.  No dullness to percussion.  No use of accessory muscles. Cardiac:  Regular rate and rhythm without murmur, gallops, or rubs Abdominal:soft, nontender, positive bowel sounds.  No organomegaly. Musculoskeletal:  No clubbing, cyanosis or edema.  No joint effusion in both knees.  With the extension of the knee and flexion of the left hip, she does have pain on the lateral side of left knee and also lateral thigh.  Has low back pain with spinal flexion which is chronic Neuro: CN grossly intact.  No acute decrease in sensation in the upper and lower extremities bilaterally. 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 -Already following with the pain clinic for epidural injections, last 1 in July 2025 - Continue with the hydrocodone . -Recommended physical therapy for tensor fascia lata strengthening and decreasing inflammation - Steroid taper recently finished by the patient which has not helped with her symptoms at all. - Lidocaine  patch ordered.  2.  Right knee effusion and pain- - Following with orthopedics and received an injection recently - Much improved symptoms  3. Restless legs syndrome - Restless legs syndrome managed with pramipexole. - Continue the same.  4.  Hypertension- - Blood pressure is well-controlled. - Continue hydralazine, Cardizem. - Low-salt diet  5.  Expressed condolences  given her son's death from cancer.  Patient is emotional today.  Denies any depression like symptoms.  She is hopeful that time will heal her sadness She does have family support    Follow-up with me in 6-8 weeks for follow-up of left lateral leg pain.     Goals     .  Lose Weight      Pt stated would like to get weight down and improve pain    . * Maintain health/healthy lifestyle (pt-stated)        LAVENIA BEAVER, MD  Portions of this note were created using dictation software and may contain typographical errors.  *Some images could not be shown.

## 2024-02-24 ENCOUNTER — Encounter: Payer: Self-pay | Admitting: Nurse Practitioner

## 2024-02-24 ENCOUNTER — Ambulatory Visit: Attending: Nurse Practitioner | Admitting: Nurse Practitioner

## 2024-02-24 VITALS — BP 128/72 | HR 110 | Temp 97.8°F | Resp 20 | Ht 67.0 in | Wt 194.0 lb

## 2024-02-24 DIAGNOSIS — M48061 Spinal stenosis, lumbar region without neurogenic claudication: Secondary | ICD-10-CM | POA: Diagnosis present

## 2024-02-24 DIAGNOSIS — G894 Chronic pain syndrome: Secondary | ICD-10-CM | POA: Diagnosis present

## 2024-02-24 DIAGNOSIS — M48062 Spinal stenosis, lumbar region with neurogenic claudication: Secondary | ICD-10-CM | POA: Insufficient documentation

## 2024-02-24 DIAGNOSIS — M5416 Radiculopathy, lumbar region: Secondary | ICD-10-CM | POA: Insufficient documentation

## 2024-02-24 NOTE — Progress Notes (Signed)
 Safety precautions to be maintained throughout the outpatient stay will include: orient to surroundings, keep bed in low position, maintain call bell within reach at all times, provide assistance with transfer out of bed and ambulation.

## 2024-02-24 NOTE — Progress Notes (Signed)
 PROVIDER NOTE: Interpretation of information contained herein should be left to medically-trained personnel. Specific patient instructions are provided elsewhere under Patient Instructions section of medical record. This document was created in part using AI and STT-dictation technology, any transcriptional errors that may result from this process are unintentional.  Patient: Erika Crawford  Service: E/M   PCP: Sherial Bail, MD  DOB: [birth year]  DOS: 02/24/2024  Provider: Emmy MARLA Blanch, NP  MRN: [id number]  Delivery: Face-to-face  Specialty: Interventional Pain Management  Type: Established Patient  Setting: Ambulatory outpatient facility  Specialty designation: 09  Referring Prov.: Sherial Bail, MD  Location: Outpatient office facility       History of present illness (HPI) Erika Crawford, a 83 y.o. 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 Erika Crawford primary complain today is Leg Pain (Left )  Pertinent problems: Erika Crawford does not have any pertinent problems on file.  Pain Assessment: Severity of Chronic pain is reported as a 10-Worst pain ever/10. Location: Leg Left/Denies. Onset: More than a month ago. Quality: Aching, Burning, Sharp. Timing: Constant. Modifying factor(s): Medis and rest. Vitals:  height is 5' 7 (1.702 m) and weight is 194 lb (88 kg). Her temporal temperature is 97.8 F (36.6 C). Her blood pressure is 128/72 and her pulse is 110 (abnormal). Her respiration is 20 and oxygen saturation is 98%.  BMI: Estimated body mass index is 30.38 kg/m as calculated from the following:   Height as of this encounter: 5' 7 (1.702 m).   Weight as of this encounter: 194 lb (88 kg).  Last encounter: 02/04/2024. Last procedure: 12/04/2023.  Reason for encounter: evaluation for possible interventional PM therapy/treatment.  The patient reports inadequate pain relief with prior intralaminar epidural steroid injection. Dr. Marcelino  has recommended a bilateral L4 transforaminal epidural steroid injection versus referral to neurosurgery for surgical decompression at L4-L5.  The patient expressed the impression that she could undergo another epidural injection today or receive an IM Toradol  injection; however I explained that an IM Toradol  injection would provide only short-term, one-time relief.  We can consider transforaminal epidural steroid injection at L4 with Dr. Marcelino.   Monitoring: [place] PMP: PDMP reviewed during this encounter.       Pharmacotherapy: No side-effects or adverse reactions reported. Compliance: No problems identified. Effectiveness: Clinically acceptable.  Erlene Doyal Crawford, [place]  02/24/2024  1:38 PM  Sign when Signing Visit Safety precautions to be maintained throughout the outpatient stay will include: orient to surroundings, keep bed in low position, maintain call bell within reach at all times, provide assistance with transfer out of bed and ambulation.     UDS:  Summary  Date Value Ref Range Status  07/30/2018 FINAL  Final    Comment:    ==================================================================== [facility] COMP DRUG ANALYSIS,[facility] ==================================================================== Test                             Result       Flag       Units Drug Present and Declared for Prescription Verification   Gabapentin                      PRESENT      EXPECTED   Acetaminophen                   PRESENT      EXPECTED   Naproxen  PRESENT      EXPECTED   Lidocaine                       PRESENT      EXPECTED Drug Absent but Declared for Prescription Verification   Hydrocodone                     Not Detected UNEXPECTED ng/mg creat ==================================================================== Test                      Result    Flag   Units      Ref Range   Creatinine              272              mg/dL       >=79 ==================================================================== Declared Medications:  The flagging and interpretation on this report are based on the  following declared medications.  Unexpected results may arise from  inaccuracies in the declared medications.  **Note: The testing scope of this panel includes these medications:  Gabapentin   Hydrocodone  (Hydrocodone -Acetaminophen )  Naproxen  **Note: The testing scope of this panel does not include small to  moderate amounts of these reported medications:  Acetaminophen   Acetaminophen  (Hydrocodone -Acetaminophen )  Lidocaine   **Note: The testing scope of this panel does not include following  reported medications:  Alendronate (Fosamax)  Amlodipine  (Norvasc )  Cholecalciferol   Levothyroxine   Mometasone  Olmesartan (Benicar)  Omeprazole (Prilosec) ==================================================================== For clinical consultation, please call [phone]. ====================================================================     No results found for: CBDTHCR No results found for: D8THCCBX No results found for: D9THCCBX  ROS  Constitutional: Denies any fever or chills Gastrointestinal: No reported hemesis, hematochezia, vomiting, or acute GI distress Musculoskeletal: Denies any acute onset joint swelling, redness, loss of ROM, or weakness Neurological: No reported episodes of acute onset apraxia, aphasia, dysarthria, agnosia, amnesia, paralysis, loss of coordination, or loss of consciousness  Medication Review  HYDROcodone -acetaminophen , Homeopathic Products, Lidocaine , Magnesium , Menthol (Topical Analgesic), Naphazoline-Pheniramine, amLODipine , cholecalciferol , cyanocobalamin, cyclobenzaprine, diltiazem, fluticasone , gabapentin , levothyroxine , linaclotide, magnesium  gluconate, magnesium  oxide, mometasone, olmesartan, omeprazole, ondansetron , pramipexole, senna, and senna-docusate  History Review   Allergy: Erika Crawford is allergic to other, zanaflex [tizanidine], celebrex [celecoxib], codeine, etodolac, hydrochlorothiazide, oxycodone , pravastatin, relafen [nabumetone], and tramadol . Drug: Erika Crawford  reports no history of drug use. Alcohol:  reports current alcohol use. Tobacco:  reports that she has quit smoking. She has never used smokeless tobacco. Social: Ms. Rayman  reports that she has quit smoking. She has never used smokeless tobacco. She reports current alcohol use. She reports that she does not use drugs. Medical:  has a past medical history of Arthritis, Complication of anesthesia, Hypertension, Hypothyroidism, Sleep apnea, and Thyroid disease. Surgical: Ms. 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 Family: family history is not on file.  Laboratory Chemistry Profile   Renal Lab Results  Component Value Date   BUN 20 05/28/2022   CREATININE 0.67 05/28/2022   GFRAA >60 09/17/2019   GFRNONAA >60 05/28/2022    Hepatic Lab Results  Component Value Date   AST 16 01/19/2014   ALT 25 01/19/2014   ALBUMIN 3.6 01/19/2014   ALKPHOS 62 01/19/2014    Electrolytes Lab Results  Component Value Date   NA 138 05/28/2022   K  3.8 05/28/2022   CL 109 05/28/2022   CALCIUM  9.1 05/28/2022    Bone No results found for: VD25OH, VD125OH2TOT, CI6874NY7, CI7874NY7, 25OHVITD1, 25OHVITD2, 25OHVITD3, TESTOFREE, TESTOSTERONE  Inflammation (CRP: Acute Phase) (ESR: Chronic Phase) No results found for: CRP, ESRSEDRATE, LATICACIDVEN       Note: Above Lab results reviewed.  Recent Imaging Review  MR FEMUR LEFT WO CONTRAST CLINICAL DATA:  Left leg pain radiating down to the foot with numbness in the foot for several  months.  EXAM: MR OF THE LEFT FEMUR WITHOUT CONTRAST  TECHNIQUE: Multiplanar, multisequence MR imaging of the left femur was performed. No intravenous contrast was administered.  COMPARISON:  04/13/2020  FINDINGS: Bones/Joint/Cartilage  Previous prior femoral stress fracture shown on 04/13/2020 has completely resolved.  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  No hip joint effusion.  Incidental note is made of a contralateral (right) knee joint effusion in the suprapatellar bursa.  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  Soft tissues  No impingement at the sciatic notch or along the sciatic nerve. No significant lesion in the subcutaneous tissues.  IMPRESSION: 1. 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 2. No impingement at the sciatic notch or along the sciatic nerve. 3. Incidental note is made of a contralateral (right) knee joint effusion in the suprapatellar bursa.  Electronically Signed   By: Ryan Salvage M.D.   On: 01/16/2024 11:40 Note: Reviewed        Physical Exam  Vitals: BP 128/72 (BP Location: Right Arm, Patient Position: Sitting)   Pulse (!) 110   Temp 97.8 F (36.6 C) (Temporal)   Resp 20   Ht 5' 7 (1.702 m)   Wt 194 lb (88 kg)   SpO2 98%   BMI 30.38 kg/m  BMI: Estimated body mass index is 30.38 kg/m as calculated from the following:   Height as of this encounter: 5' 7 (1.702 m).   Weight as of this encounter: 194 lb (88 kg). Ideal: Ideal body weight: 61.6 kg (135 lb 12.9 oz) Adjusted ideal body weight: 72.2 kg (159 lb 1.3 oz) General appearance: Well nourished, well developed, and well hydrated. In no apparent acute distress Mental status: Alert, oriented x 3 (person, place, & time)        Respiratory: No evidence of acute respiratory distress Eyes: PERLA  Lumbar Exam  Skin & Axial Inspection: No masses, redness, or swelling Alignment: Symmetrical Functional ROM: Pain restricted ROM affecting both sides Stability: No instability detected Muscle Tone/Strength: Functionally intact. No obvious neuro-muscular anomalies detected. Sensory (Neurological): Musculoskeletal pain pattern Palpation: No palpable anomalies       Provocative Tests: Hyperextension/rotation test: deferred today       Lumbar quadrant test (Kemp's test): deferred today       Lateral bending test: (+) due to pain. Patrick's Maneuver: (+) for bilateral S-I arthralgia  FABER* test: (+) for bilateral S-I arthralgia   *(Flexion, ABduction and External Rotation)  Assessment   Diagnosis Status  1. Lumbar foraminal stenosis   2. Lumbar radiculopathy   3. Chronic pain syndrome   4. Spinal stenosis, lumbar region, with neurogenic claudication    Persistent Persistent Persistent   Updated Problems: No problems updated.  Plan of Care  Problem-specific:  Assessment and Plan  Plan: (Clinic): (B) L-TFESI # 1 with Dr. Marcelino  Ms. AVYN ADEN has a current medication list which includes the following long-term medication(s): diltiazem, diltiazem, gabapentin , levothyroxine , levothyroxine , linaclotide, mometasone, olmesartan, and omeprazole.  Pharmacotherapy (Medications Ordered): No orders of the defined types were placed in this encounter.  Orders:  Orders Placed This Encounter  Procedures   Lumbar Transforaminal Epidural    Standing Status:   Future    Expected Date:   03/09/2024    Expiration Date:   02/23/2025    Scheduling Instructions:     Laterality: Bilateral      Level(s): L4 & L5     Sedation: Patient's choice.     Timeframe: As soon as schedule allows.    Where will this procedure be performed?:   [facility]        Return in about 2 weeks (around 03/09/2024) for  (Clinic): (B) L-TFESI # 1 with Dr. Marcelino.    Recent Visits Date Type Provider Dept  02/10/24 Office Visit Marcelino Nurse, MD [facility]  02/04/24 Office Visit Teagen Bucio K, NP [facility]  01/21/24 Office Visit Marcelino Nurse, MD [facility]  12/22/23 Procedure visit Marcelino Nurse, MD [facility]  12/04/23 Procedure visit Egan Berkheimer K, NP [facility]  Showing recent visits within past 90 days and meeting all other requirements Today's Visits Date Type Provider Dept  02/24/24 Office Visit Marcayla Budge K, NP [facility]  Showing today's visits and meeting all other requirements Future Appointments No visits were found meeting these conditions. Showing future appointments within next 90 days and meeting all other requirements  I discussed the assessment and treatment plan with the patient. The patient was provided an opportunity to ask questions and all were answered. The patient agreed with the plan and demonstrated an understanding of the instructions.  Patient advised to call back or seek an in-person evaluation if the symptoms or condition worsens.  Duration of encounter: 20 minutes.  Total time on encounter, as per AMA guidelines included both the face-to-face and non-face-to-face time personally spent by the physician and/or other qualified health care professional(s) on the day of the encounter (includes time in activities that require the physician or other qualified health care professional and does not include time in activities normally performed by clinical staff). Physician's time may include the following activities when performed: Preparing to see the patient (e.g., pre-charting review of records, searching for previously ordered imaging, lab work, and nerve conduction tests) Review of prior analgesic pharmacotherapies. Reviewing PMP Interpreting ordered tests (e.g., lab work, imaging, nerve conduction  tests) Performing post-procedure evaluations, including interpretation of diagnostic procedures Obtaining and/or reviewing separately obtained history Performing a medically appropriate examination and/or evaluation Counseling and educating the patient/family/caregiver Ordering medications, tests, or procedures Referring and communicating with other health care professionals (when not separately reported) Documenting clinical information in the electronic or other health record Independently interpreting results (not separately reported) and communicating results to the patient/ family/caregiver Care coordination (not separately reported)  Note by: Travers Goodley K Thelma Viana, NP (TTS and AI technology used. I apologize for any typographical errors that were not detected and corrected.) Date: 02/24/2024; Time: 3:05 PM

## 2024-02-26 ENCOUNTER — Ambulatory Visit: Admitting: Nurse Practitioner

## 2024-03-18 ENCOUNTER — Other Ambulatory Visit: Payer: Self-pay | Admitting: Family Medicine

## 2024-03-18 ENCOUNTER — Inpatient Hospital Stay
Admission: RE | Admit: 2024-03-18 | Discharge: 2024-03-18 | Disposition: A | Discharge status: Home / Self Care | Payer: Self-pay | Source: Ambulatory Visit | Attending: Neurosurgery | Admitting: Neurosurgery

## 2024-03-18 DIAGNOSIS — Z049 Encounter for examination and observation for unspecified reason: Secondary | ICD-10-CM

## 2024-03-22 NOTE — Progress Notes (Signed)
 Referring Physician:  Dodson Delon FERNS, MD [address] [place],  [place] [address]  Primary Physician:  Sherial Bail, MD  History of Present Illness: 03/29/2024 [name] is here today with a chief complaint of left lower extremity radiculopathy.  She has had a chronic history of this for at least the past 8 months.  She has had injections since April.  She has debilitating left lower extremity pain is not having any improvement.  She has minimal to no back pain.  Radiates from her buttocks down the back of her leg into the side of her lower extremity to the top of her foot.  Conservative measures:  Physical therapy: Has not participated in PT. Multimodal medical therapy including regular antiinflammatories: Flexeril, Gabapentin , Hyrocodone, Biofreeze, Aspercreme Injections: 03/12/2024 Left S1 ESI 12/22/2023 L5-S1 Left ESI 01/21/2024 L5-S1 left ESI 09/29/2023 L5-S1 left ESI  Past Surgery: 02/01/2014 L2 Kyphoplasty  The symptoms are causing a significant impact on the patient's life.   I have utilized the care everywhere function in epic to review the outside records available from external health systems.  Review of Systems:  A 10 point review of systems is negative, except for the pertinent positives and negatives detailed in the HPI.  Past Medical History: Past Medical History:  Diagnosis Date   Arthritis    Complication of anesthesia    dizziness   Hypertension    Hypothyroidism    Sleep apnea    cpap   Thyroid disease     Past Surgical History: Past Surgical History:  Procedure Laterality Date   ABDOMINAL HYSTERECTOMY     BACK SURGERY     lower middle kyphoplasty   COLONOSCOPY WITH PROPOFOL  N/A 09/21/2015   Procedure: COLONOSCOPY WITH PROPOFOL ;  Surgeon: Deward CINDERELLA Piedmont, MD;  Location: [facility];  Service: Gastroenterology;  Laterality: N/A;   COLONOSCOPY WITH PROPOFOL  N/A 04/24/2020   Procedure: COLONOSCOPY WITH PROPOFOL ;  Surgeon: Maryruth Ole DASEN, MD;  Location: [facility];  Service: Endoscopy;  Laterality: N/A;   JOINT REPLACEMENT     03/19/2016- left knee replacement   REVERSE SHOULDER ARTHROPLASTY Right 08/25/2018   Procedure: REVERSE SHOULDER ARTHROPLASTY, RIGHT;  Surgeon: Edie Norleen PARAS, MD;  Location: [facility];  Service: Orthopedics;  Laterality: Right;   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   Procedure: TOTAL KNEE ARTHROPLASTY;  Surgeon: Norleen PARAS Edie, MD;  Location: [facility];  Service: Orthopedics;  Laterality: Left;    Allergies: Allergies as of 03/29/2024 - Review Complete 03/29/2024  Allergen Reaction Noted   Other Other (See Comments) 08/09/2014   Zanaflex [tizanidine]  04/21/2020   Celebrex [celecoxib] Rash 09/20/2015   Codeine Nausea And Vomiting 09/20/2015   Etodolac Nausea And Vomiting 09/20/2015   Hydrochlorothiazide  09/20/2015   Oxycodone  Nausea And Vomiting 09/20/2015   Pravastatin  09/20/2015   Relafen [nabumetone]  09/20/2015   Tramadol  Nausea And Vomiting 09/20/2015    Medications:  Current Outpatient Medications:    amLODipine  (NORVASC ) 5 MG tablet, Take 5 mg by mouth daily., Disp: , Rfl:    ASPERCREME LIDOCAINE  EX, Apply 1 application  topically daily as needed (pain)., Disp: , Rfl:    cholecalciferol  (VITAMIN D3) 25 MCG (1000 UT) tablet, Take 2,000 Units by mouth daily., Disp: , Rfl:    cyanocobalamin 1000 MCG tablet, Take by mouth., Disp: , Rfl:    cyclobenzaprine (FLEXERIL) 10 MG tablet, Take 1 tablet by mouth 3 (three) times daily as needed., Disp: , Rfl:    diltiazem (  CARDIZEM CD) 120 MG 24 hr capsule, Take 120 mg by mouth daily., Disp: , Rfl:    diltiazem (TIAZAC) 120 MG 24 hr capsule, , Disp: , Rfl:    ezetimibe (ZETIA) 10 MG tablet, Take 10 mg by mouth., Disp: , Rfl:    fluticasone  (FLONASE ) 50 MCG/ACT nasal spray, SHAKE LIQUID AND USE 2 SPRAYS IN EACH NOSTRIL EVERY DAY, Disp: , Rfl:    gabapentin  (NEURONTIN ) 300 MG capsule, 300 mg qday, 300 mg qPM, 600 mg qhs (Patient taking  differently: 300 mg. 300 mg qday,  600 mg qhs), Disp: 60 capsule, Rfl: 2   Homeopathic Products (THERAWORX RELIEF EX), Apply 1 application  topically daily as needed (pain)., Disp: , Rfl:    HYDROcodone -acetaminophen  (NORCO) 7.5-325 MG tablet, Take 1-2 tablets by mouth every 12 (twelve) hours as needed for moderate pain (pain score 4-6)., Disp: 60 tablet, Rfl: 0   levothyroxine  (SYNTHROID ) 88 MCG tablet, Take by mouth., Disp: , Rfl:    levothyroxine  (SYNTHROID , LEVOTHROID) 88 MCG tablet, Take 88 mcg by mouth daily before breakfast. , Disp: , Rfl:    linaclotide (LINZESS) 145 MCG CAPS capsule, Take 145 mcg by mouth daily., Disp: , Rfl:    Magnesium  250 MG TABS, Take 250 mg by mouth 2 (two) times daily., Disp: , Rfl:    magnesium  gluconate 54mg /85ml syringe, Take by mouth., Disp: , Rfl:    magnesium  oxide (MAG-OX) 400 MG tablet, Take 250 mg by mouth daily., Disp: , Rfl:    Menthol, Topical Analgesic, (BIOFREEZE EX), Apply 1 application topically daily as needed (pain)., Disp: , Rfl:    Menthol, Topical Analgesic, (TWO [place] ARTHRITIS EX), Apply 1 application  topically daily as needed (pain)., Disp: , Rfl:    mometasone (NASONEX) 50 MCG/ACT nasal spray, Place 2 sprays into the nose daily as needed (allergies). , Disp: , Rfl:    Naphazoline-Pheniramine (OPCON-A  OP), Place 1 drop into both eyes daily as needed (allergies)., Disp: , Rfl:    olmesartan (BENICAR) 40 MG tablet, Take 40 mg by mouth daily., Disp: , Rfl:    omeprazole (PRILOSEC) 20 MG capsule, Take 20 mg by mouth daily., Disp: , Rfl:    ondansetron  (ZOFRAN -ODT) 4 MG disintegrating tablet, Take 1 tablet (4 mg total) by mouth every 8 (eight) hours as needed., Disp: 20 tablet, Rfl: 0   pramipexole (MIRAPEX) 0.5 MG tablet, Take 0.5 mg by mouth at bedtime as needed., Disp: , Rfl:    senna (SENOKOT) 8.6 MG tablet, Take 1 tablet by mouth 2 (two) times daily as needed for constipation., Disp: , Rfl:    senna-docusate (SENOKOT-S) 8.6-50 MG tablet,  Take by mouth., Disp: , Rfl:    vitamin B-12 (CYANOCOBALAMIN) 1000 MCG tablet, Take 1,000 mcg by mouth daily., Disp: , Rfl:   Social History: Social History   Tobacco Use   Smoking status: Former   Smokeless tobacco: Never   Tobacco comments:    quit 30 years ago  Vaping Use   Vaping status: Never Used  Substance Use Topics   Alcohol use: Yes    Comment: very little   Drug use: No    Family Medical History: Family History  Problem Relation Age of Onset   Breast cancer Neg Hx     Physical Examination: Vitals:   03/29/24 1500  BP: 130/82    General: Patient is in no apparent distress. Attention to examination is appropriate.  Neck:   Supple.  Full range of motion.  Respiratory: Patient is  breathing without any difficulty.   NEUROLOGICAL:     Awake, alert, oriented to person, place, and time.  Speech is clear and fluent.   Cranial Nerves: Pupils equal round and reactive to light.  Facial tone is symmetric.  Facial sensation is symmetric. Shoulder shrug is symmetric. Tongue protrusion is midline.    Strength:  Side Iliopsoas Quads Hamstring PF DF EHL  R 5 5 5 5 5 5   L 5 5 4+ 4+ 4 3   Reflexes are 2+ and symmetric at the biceps, triceps, brachioradialis, patella and achilles.   Hoffman's is absent. Clonus is absent Decreased left medial hamstring reflex  Bilateral upper and lower extremity sensation is intact to light touch, loss of sensation left L5  Cane and walker assisted.   Imaging: Narrative & Impression  CLINICAL DATA:  Left lower extremity radiculopathy.   EXAM: MRI LUMBAR SPINE WITHOUT CONTRAST   TECHNIQUE: Multiplanar, multisequence MR imaging of the lumbar spine was performed. No intravenous contrast was administered.   COMPARISON:  MRI of the lumbar spine dated March 18, 2017.   FINDINGS: Segmentation:  5 non rib-bearing lumbar type vertebrae.   Alignment: Grade 1 anterolisthesis at L4-5 and slight retrolisthesis at L2-3 and L3-4.    Vertebrae: Chronic moderate to severe compression deformity of L2 status post augmentation. There is persistent retropulsion of the posterior wall causing moderate central spinal canal stenosis, as before. Mild interval worsening of degenerative anterolisthesis at L4-5. No osseous lesions.   Conus medullaris and cauda equina: Conus extends to the L1-2 level. Conus and cauda equina appear normal.   Paraspinal and other soft tissues: There are bilateral parapelvic renal cysts present. No follow-up is necessary. The paraspinous soft tissues are otherwise unremarkable.   Disc levels:   T12-L1: Normal.   L1-2: Mild disc bulging and retropulsion of the posterosuperior corner of L2, resulting in moderate central spinal canal stenosis. The neural foramina are unremarkable. There is no nerve root impingement.   L2-3: Broad-based disc bulging and bilateral facet hypertrophy, causing moderate central spinal canal stenosis and moderate bilateral lateral recess stenosis. No definite nerve root impingement.   L3-4: Broad-based disc bulging and bilateral facet hypertrophy, with moderate central spinal canal stenosis and mild-to-moderate bilateral lateral recess stenosis. No apparent nerve root impingement.   L4-5: Grade 1 anterolisthesis with marked bilateral facet arthrosis, causing severe central spinal canal stenosis and bilateral lateral recess stenosis, with impingement of the L5 nerves in the lateral recesses bilaterally. There is also moderate bilateral neural foraminal stenosis.   L5-S1: Mild disc bulging and bilateral facet hypertrophy, with mild central spinal canal stenosis and mild bilateral neural foraminal stenosis.   IMPRESSION: 1. Interval worsening of degenerative anterolisthesis at L4-5, now with severe central spinal canal stenosis, bilateral lateral recess stenosis and impingement of the L5 nerves bilaterally. 2. Chronic moderate to severe compression deformity of  L2 status post augmentation. Persistent moderate central spinal canal stenosis.     Electronically Signed   By: Evalene Coho M.D.   On: 01/14/2024 09:57   I have personally reviewed the images and agree with the above interpretation.  I am awaiting final reads on her x-rays, does not appear to have a significant amount of pathologic motion noted.  Medical Decision Making/Assessment and Plan: Ms. Bier is a pleasant 83 y.o. female with severe left-sided lumbar radiculopathy.  She has severe pain going from her buttocks down the back of her leg to the side of her foot mostly in an L5 type  distribution.  Has positive straight leg raise.  Decreased medial hamstrings reflex.  She has weakness as well in her L5 myotome with incomplete dorsiflexion and incomplete EHL function graded above.  She has a loss of sensation on the top of her foot.  She has minimal back pain.  She does have a worsening spondylolisthesis noted on her MRI from subsequent imaging and some T2 signal in her joint capsules.  I would like to get flexion-extension x-rays of her lumbar spine.  She does not have any frank instability could consider a unilateral decompression despite the bilateral compression noted on the MRI.  Majority of her symptoms seem to be left-sided.  I will reach out to the pain team to discuss this possibility.  Thank you for involving me in the care of this patient.    Penne MICAEL Sharps MD/MSCR Neurosurgery

## 2024-03-29 ENCOUNTER — Ambulatory Visit (INDEPENDENT_AMBULATORY_CARE_PROVIDER_SITE_OTHER): Admitting: Neurosurgery

## 2024-03-29 ENCOUNTER — Ambulatory Visit

## 2024-03-29 ENCOUNTER — Encounter: Payer: Self-pay | Admitting: Neurosurgery

## 2024-03-29 VITALS — BP 130/82 | Ht 67.0 in | Wt 193.8 lb

## 2024-03-29 DIAGNOSIS — S32020D Wedge compression fracture of second lumbar vertebra, subsequent encounter for fracture with routine healing: Secondary | ICD-10-CM

## 2024-03-29 DIAGNOSIS — M48062 Spinal stenosis, lumbar region with neurogenic claudication: Secondary | ICD-10-CM

## 2024-03-29 DIAGNOSIS — M5416 Radiculopathy, lumbar region: Secondary | ICD-10-CM | POA: Diagnosis not present

## 2024-03-29 DIAGNOSIS — M4316 Spondylolisthesis, lumbar region: Secondary | ICD-10-CM | POA: Diagnosis not present

## 2024-03-29 DIAGNOSIS — R29898 Other symptoms and signs involving the musculoskeletal system: Secondary | ICD-10-CM | POA: Diagnosis not present

## 2024-04-01 ENCOUNTER — Ambulatory Visit: Attending: Student in an Organized Health Care Education/Training Program | Admitting: Physical Therapy

## 2024-04-01 ENCOUNTER — Encounter: Payer: Self-pay | Admitting: Physical Therapy

## 2024-04-01 DIAGNOSIS — M5459 Other low back pain: Secondary | ICD-10-CM | POA: Diagnosis present

## 2024-04-01 DIAGNOSIS — M48062 Spinal stenosis, lumbar region with neurogenic claudication: Secondary | ICD-10-CM | POA: Insufficient documentation

## 2024-04-01 DIAGNOSIS — M5416 Radiculopathy, lumbar region: Secondary | ICD-10-CM | POA: Insufficient documentation

## 2024-04-01 DIAGNOSIS — M48061 Spinal stenosis, lumbar region without neurogenic claudication: Secondary | ICD-10-CM | POA: Insufficient documentation

## 2024-04-01 DIAGNOSIS — G894 Chronic pain syndrome: Secondary | ICD-10-CM | POA: Insufficient documentation

## 2024-04-01 NOTE — Therapy (Signed)
OUTPATIENT PHYSICAL THERAPY THORACOLUMBAR EVALUATION   Patient Name: Erika Crawford MRN: [id number] DOB:03-11-1941, 83 y.o., female Today's Date: 04/01/2024  END OF SESSION:  PT End of Session - 04/01/24 1650     Visit Number 1    Number of Visits 24    Date for Recertification  06/24/24    Authorization Type [employer] Dual    PT Start Time 1515    PT Stop Time 1600    PT Time Calculation (min) 45 min    Equipment Utilized During Treatment Gait belt    Activity Tolerance Patient tolerated treatment well    Behavior During Therapy WFL for tasks assessed/performed          Past Medical History:  Diagnosis Date   Arthritis    Complication of anesthesia    dizziness   Hypertension    Hypothyroidism    Sleep apnea    cpap   Thyroid disease    Past Surgical History:  Procedure Laterality Date   ABDOMINAL HYSTERECTOMY     BACK SURGERY     lower middle kyphoplasty   COLONOSCOPY WITH PROPOFOL  N/A 09/21/2015   Procedure: COLONOSCOPY WITH PROPOFOL ;  Surgeon: Deward CINDERELLA Piedmont, MD;  Location: [facility];  Service: Gastroenterology;  Laterality: N/A;   COLONOSCOPY WITH PROPOFOL  N/A 04/24/2020   Procedure: COLONOSCOPY WITH PROPOFOL ;  Surgeon: Maryruth Ole DASEN, MD;  Location: [facility];  Service: Endoscopy;  Laterality: N/A;   JOINT REPLACEMENT     03/19/2016- left knee replacement   REVERSE SHOULDER ARTHROPLASTY Right 08/25/2018   Procedure: REVERSE SHOULDER ARTHROPLASTY, RIGHT;  Surgeon: Edie Norleen PARAS, MD;  Location: [facility];  Service: Orthopedics;  Laterality: Right;   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   Procedure: TOTAL KNEE ARTHROPLASTY;  Surgeon: Norleen PARAS Edie, MD;  Location: [facility];  Service: Orthopedics;  Laterality: Left;   Patient Active Problem List   Diagnosis Date Noted   Lumbar foraminal stenosis 04/23/2023   Chronic pain syndrome 04/23/2023   Status post reverse total shoulder replacement, right 08/25/2018   Chronic left-sided low back pain with left-sided  sciatica 06/22/2018   Vitamin B 12 deficiency 01/02/2018   Chronic anemia 09/06/2017   Chronic constipation 06/08/2017   Lumbar radiculopathy 05/22/2017   Spinal stenosis, lumbar region, with neurogenic claudication 03/19/2017   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 03/10/2017   Acute bilateral low back pain without sciatica 02/04/2017   Dysuria 12/13/2016   Prediabetes 11/05/2016   Acquired hypothyroidism 08/24/2016   Myofascial pain syndrome 08/24/2016   Essential hypertension 08/24/2016   Osteopenia of multiple sites 08/24/2016   Lumbar degenerative disc disease 08/24/2016   Bilateral hydronephrosis 07/15/2016   Rotator cuff tendinitis, left 06/21/2016   Status post total knee replacement using cement, 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   Rotator cuff tendinitis, right 11/27/2015   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   Injury of tendon of long head of right biceps 11/27/2015   Incomplete tear of right rotator cuff 11/27/2015   Obstructive sleep apnea on CPAP 07/13/2015   Osteoarthritis 11/04/2013   Disorder of uterus 04/29/1995    PCP: Dr. Lavenia Beaver    REFERRING PROVIDER: Dr.Bilal Lateef    REFERRING DIAG:  M54.16 (ICD-10-CM) - Lumbar radiculopathy M48.061 (ICD-10-CM) - Lumbar foraminal stenosis M48.062 (ICD-10-CM) - Spinal stenosis, lumbar region, with neurogenic claudication G89.4 (ICD-10-CM) - Chronic pain syndrome  Rationale for Evaluation and Treatment: Rehabilitation  THERAPY DIAG:  No diagnosis found.  ONSET DATE: June  2025   SUBJECTIVE:  SUBJECTIVE STATEMENT: Pt reports that she started to experience pain this June after walking in hospital while visiting her son and she heard a pop that resulted in increased pain. She saw pain clinic and received a epidural  and she reports that this worsened the pain. She does not describe feeling numbness and tingling but does describe a constant throbbing . It has stopped her from doing cooking and shopping. Despite the recommendation being for spinal surgery, pt wants to avoid this at all costs and she is hoping that PT can help.   PERTINENT HISTORY:  Per Dr. Charolotte note on 02/10/24  Patient states that she is having increased lumbar radicular pain that is making it very challenging for her to walk. She is currently on hydrocodone  5 mg 3 times daily as needed as well as gabapentin  300 mg 3 times a day. I recommend a Medrol  Dosepak as below. She has an appointment already scheduled for September 11 at which point we will reevaluate. Future considerations could include bilateral L4 transforaminal ESI versus referral to neurosurgery for evaluation for surgical decompression at L4-L5.   PAIN:  Are you having pain? Yes: NPRS scale: 6-7/10   Pain location: Left Buttocks down posterior thigh into anterior ankle    Pain description: Throbbing    Aggravating factors: Bending forward to pick up something    Relieving factors: Pain medication helps    PRECAUTIONS: None  RED FLAGS: None   WEIGHT BEARING RESTRICTIONS: No  FALLS:  Has patient fallen in last 6 months? No  LIVING ENVIRONMENT: Lives with: lives alone Lives in: House/apartment Stairs: No Has following equipment at home: Single point cane and Environmental consultant - 4 wheeled  OCCUPATION: Retired    PLOF: Independent  PATIENT GOALS: Pt wants to feel less pain so she can return to shopping and cooking without being limited by pain.    NEXT MD VISIT: Did not ask    OBJECTIVE:  Note: Objective measures were completed at Evaluation unless otherwise noted.  VITALS BP  137/66 HR 91 SpO2  100%  DIAGNOSTIC FINDINGS:  CLINICAL DATA:  Left lower extremity radiculopathy.   EXAM: MRI LUMBAR SPINE WITHOUT CONTRAST   TECHNIQUE: Multiplanar, multisequence MR  imaging of the lumbar spine was performed. No intravenous contrast was administered.   COMPARISON:  MRI of the lumbar spine dated March 18, 2017.   FINDINGS: Segmentation:  5 non rib-bearing lumbar type vertebrae.   Alignment: Grade 1 anterolisthesis at L4-5 and slight retrolisthesis at L2-3 and L3-4.   Vertebrae: Chronic moderate to severe compression deformity of L2 status post augmentation. There is persistent retropulsion of the posterior wall causing moderate central spinal canal stenosis, as before. Mild interval worsening of degenerative anterolisthesis at L4-5. No osseous lesions.   Conus medullaris and cauda equina: Conus extends to the L1-2 level. Conus and cauda equina appear normal.   Paraspinal and other soft tissues: There are bilateral parapelvic renal cysts present. No follow-up is necessary. The paraspinous soft tissues are otherwise unremarkable.   Disc levels:   T12-L1: Normal.   L1-2: Mild disc bulging and retropulsion of the posterosuperior corner of L2, resulting in moderate central spinal canal stenosis. The neural foramina are unremarkable. There is no nerve root impingement.   L2-3: Broad-based disc bulging and bilateral facet hypertrophy, causing moderate central spinal canal stenosis and moderate bilateral lateral recess stenosis. No definite nerve root impingement.   L3-4: Broad-based disc bulging and bilateral facet hypertrophy, with moderate central spinal canal stenosis and mild-to-moderate  bilateral lateral recess stenosis. No apparent nerve root impingement.   L4-5: Grade 1 anterolisthesis with marked bilateral facet arthrosis, causing severe central spinal canal stenosis and bilateral lateral recess stenosis, with impingement of the L5 nerves in the lateral recesses bilaterally. There is also moderate bilateral neural foraminal stenosis.   L5-S1: Mild disc bulging and bilateral facet hypertrophy, with mild central spinal canal  stenosis and mild bilateral neural foraminal stenosis.   IMPRESSION: 1. Interval worsening of degenerative anterolisthesis at L4-5, now with severe central spinal canal stenosis, bilateral lateral recess stenosis and impingement of the L5 nerves bilaterally. 2. Chronic moderate to severe compression deformity of L2 status post augmentation. Persistent moderate central spinal canal stenosis.     Electronically Signed   By: Evalene Coho M.D.   On: 01/14/2024 09:57  PATIENT SURVEYS:  Modified Oswestry:  MODIFIED OSWESTRY DISABILITY SCALE  Date: 04/01/24 Score  Pain intensity 4 =  Pain medication provides me with little relief from pain.  2. Personal care (washing, dressing, etc.) 3 =  I need help, but I am able to manage most of my personal care.  3. Lifting 5 =  I cannot lift or carry anything at all.  4. Walking 4 = I can only walk with crutches or a cane.  5. Sitting 2 =  Pain prevents me from sitting more than 1 hour.  6. Standing 2 =  Pain prevents me from standing more than 1 hour  7. Sleeping 2 =  Even when I take pain medication, I sleep less than 6 hours  8. Social Life 4 =  Pain has restricted my social life to my home  9. Traveling 5 = My pain prevents all travel except for visits to the physician/therapist or hospital  10. Employment/ Homemaking 4 = Pain prevents me from doing even light duties.  Total 35/50 (70%)   Interpretation of scores: Score Category Description  0-20% Minimal Disability The patient can cope with most living activities. Usually no treatment is indicated apart from advice on lifting, sitting and exercise  21-40% Moderate Disability The patient experiences more pain and difficulty with sitting, lifting and standing. Travel and social life are more difficult and they may be disabled from work. Personal care, sexual activity and sleeping are not grossly affected, and the patient can usually be managed by conservative means  41-60% Severe Disability  Pain remains the main problem in this group, but activities of daily living are affected. These patients require a detailed investigation  61-80% Crippled Back pain impinges on all aspects of the patient's life. Positive intervention is required  81-100% Bed-bound These patients are either bed-bound or exaggerating their symptoms  Bluford FORBES Zoe DELENA Karon DELENA, et al. Surgery versus conservative management of stable thoracolumbar fracture: the PRESTO feasibility RCT. [place]): [employer]; 2021 Nov. [facility] Technology Assessment, No. 25.62.) Appendix 3, Oswestry Disability Index category descriptors. Available from: FindJewelers.cz  Minimally Clinically Important Difference (MCID) = 12.8%  COGNITION: Overall cognitive status: Within functional limits for tasks assessed     SENSATION: WFL  MUSCLE LENGTH: Hamstrings: Right 90 deg; Left 70 deg* Thomas test: Not performed    POSTURE: rounded shoulders and forward head  PALPATION:   LUMBAR ROM:   AROM eval  Flexion 100%*  Extension 100%  Right lateral flexion 100%  Left lateral flexion 100%  Right rotation 100%  Left rotation 100%   (Blank rows = not tested)  LOWER EXTREMITY ROM:     Active  Right eval Left eval  Hip flexion    Hip extension    Hip abduction    Hip adduction    Hip internal rotation    Hip external rotation    Knee flexion    Knee extension    Ankle dorsiflexion    Ankle plantarflexion    Ankle inversion    Ankle eversion     (Blank rows = not tested)  LOWER EXTREMITY MMT:    MMT Right eval Left eval  Hip flexion 4 4-*  Hip extension    Hip External Rotation in Sitting 4 4  Hip adduction 4 4  Hip internal rotation    Knee flexion 4 4  Knee extension 4 4  Ankle dorsiflexion 4 4  Ankle plantarflexion    Ankle inversion    Ankle eversion     (Blank rows = not tested)  LUMBAR SPECIAL TESTS:  Straight leg raise test: Positive  FUNCTIONAL  TESTS:  5 times sit to stand: NT  30 seconds chair stand test Timed up and go (TUG): NT 2 minute walk test: NT 10 meter walk test: NT  GAIT: Distance walked: 50 ft   Assistive device utilized: Environmental consultant - 4 wheeled Level of assistance: Modified independence Comments: Decreased step length bilaterally   TREATMENT DATE: 04/01/24                                                                                                                               THEREX   Seated HS Stretch on LLE 2 x 60 sec -Pt reports increased radiating pain  Left Single Knee to Chest on LLE  with 3 sec hold 2 x 10   Supine  Lower Trunk Rotation 2 x 10  -Pt reports increased pain when rotation hips to left    Seated Hip Marches on LLE 1 x 10         PATIENT EDUCATION:  Education details: Form and technique for correct performance of exercise and explanation about deficits resulting from L4 nerve impingement  Person educated: Patient Education method: Programmer, multimedia, Demonstration, Verbal cues, and Handouts Education comprehension: verbalized understanding, returned demonstration, and verbal cues required  HOME EXERCISE PROGRAM: Access Code: [email or web address] URL: [email or web address] Date: 04/01/2024 Prepared by: Toribio Servant  Exercises - Hooklying Single Knee to Chest Stretch  - 1 x daily - 7 x weekly - 2 sets - 10 reps - 3 sec  hold  ASSESSMENT:  CLINICAL IMPRESSION: Patient is a 84 y.o. AA female who was seen today for physical therapy evaluation and treatment for subacute left sided radiculopathy. She has signs and symptoms that make her most appropriate for the symptom modulation treatment based classification group with high pain and disability. She has no clear directional preference, but flexion is worse than extension. Pt has deficits that include decreased LE strength, mobility, and increased pain with movement.PT limited in exercises due to pt's high pain provocation. She will benefit from  skilled PT to address these aforementioned deficits to return to walking and standing for prolonged periods of time to be able to return to shopping and cooking to remain independent and to improve quality of life.    OBJECTIVE IMPAIRMENTS: Abnormal gait, decreased balance, decreased coordination, decreased knowledge of condition, decreased mobility, difficulty walking, decreased ROM, decreased strength, hypomobility, impaired flexibility, impaired UE functional use, postural dysfunction, obesity, and pain.   ACTIVITY LIMITATIONS: carrying, lifting, bending, standing, squatting, sleeping, stairs, transfers, bed mobility, bathing, toileting, dressing, hygiene/grooming, and locomotion level  PARTICIPATION LIMITATIONS: meal prep, cleaning, laundry, shopping, community activity, and church  PERSONAL FACTORS: Age, Fitness, and 3+ comorbidities: HTN, severe right knee OA, chronic pain syndrome are also affecting patient's functional outcome.   REHAB POTENTIAL: Good  CLINICAL DECISION MAKING: Stable/uncomplicated  EVALUATION COMPLEXITY: Low   GOALS: Goals reviewed with patient? No  SHORT TERM GOALS: Target date: 04/15/2024  Patient will demonstrate undestanding of home exercise plan by performing exercises correctly with evidence of good carry over with min to no verbal or tactile cues .   Baseline: NT  Goal status: INITIAL  2.  Patient will demonstrate understanding of how to use TENS unit to relieve left sided radiating low back pain with movement by independently placing pads on her low back in box pattern and turning on unit to proper setting.  Baseline: NT   Goal status: INITIAL    LONG TERM GOALS: Target date: 06/24/2024  Patient will improve modified Oswestry Disability Index (MODI) score by decreasing initial score by >=13% as evidence of the minimal statistically significant change for improvement with low back pain disability and improvement in low back function (Copay et al,  2008) Baseline: 70% (35/50) Goal status: INITIAL  2.  Patient will be able to perform supine<>transfer with supervision as evidence of improved mobility and core and hip strength to remain independent.  Baseline: min A to help transfer from side lying to sit  Goal status: INITIAL  3.  Patient will perform TUG test in <12 sec as evidence of improved LLE function and mobility to decrease risk of falling and to continue to remain independent.   Baseline: NT   Goal status: INITIAL  4. Pt will increase by at least 12.2 m (40 ft) in order to demonstrate clinically significant improvement in cardiopulmonary endurance and community ambulation. Normie & Debby, 2009). Baseline: NT  Goal status: INITIAL  5.  Patient will be able to perform sit to stand without use of UE support from 18 inch mat height as evidence of improved LE strength.   Baseline: Needs use of BUE support to stand up from a seated position  Goal status: INITIAL   PLAN:  PT FREQUENCY: 1-2x/week  PT DURATION: 12 weeks  PLANNED INTERVENTIONS: 97164- PT Re-evaluation, 97750- Physical Performance Testing, 97110-Therapeutic exercises, 97530- Therapeutic activity, 97112- Neuromuscular re-education, 97535- Self Care, 02859- Manual therapy, [phone]- Gait training, [phone]- Orthotic Initial, [phone]- Aquatic Therapy, [phone]- Electrical stimulation (unattended), [phone]- Electrical stimulation (manual), [id number]- Traction (mechanical), 20560 (1-2 muscles), 20561 (3+ muscles)- Dry Needling, Patient/Family education, Balance training, Stair training, Taping, Joint mobilization, Joint manipulation, Spinal manipulation, Spinal mobilization, Vestibular training, DME instructions, Cryotherapy, and Moist heat.  PLAN FOR NEXT SESSION: Education on TENS unit and use to determine if pain with movement is reduced. TUG test and     Toribio Servant PT, DPT  [facility] [address] [place],  [address] Phone: [phone]   Fax:  [phone]  

## 2024-04-06 ENCOUNTER — Ambulatory Visit: Admitting: Physical Therapy

## 2024-04-13 ENCOUNTER — Ambulatory Visit: Admitting: Physical Therapy

## 2024-04-13 DIAGNOSIS — M5416 Radiculopathy, lumbar region: Secondary | ICD-10-CM

## 2024-04-13 DIAGNOSIS — M5459 Other low back pain: Secondary | ICD-10-CM | POA: Diagnosis not present

## 2024-04-13 NOTE — Therapy (Unsigned)
 OUTPATIENT PHYSICAL THERAPY THORACOLUMBAR TREATMENT    Patient Name: Erika Crawford MRN: [id number] DOB:08-04-1940, 83 y.o., female Today's Date: 04/13/2024  END OF SESSION:  PT End of Session - 04/13/24 1440     Visit Number 2    Number of Visits 24    Date for Recertification  06/24/24    Authorization Type [employer] Dual Medicare    PT Start Time 1430    PT Stop Time 1515    PT Time Calculation (min) 45 min    Equipment Utilized During Treatment Gait belt    Activity Tolerance Patient tolerated treatment well    Behavior During Therapy WFL for tasks assessed/performed           Past Medical History:  Diagnosis Date   Arthritis    Complication of anesthesia    dizziness   Hypertension    Hypothyroidism    Sleep apnea    cpap   Thyroid disease    Past Surgical History:  Procedure Laterality Date   ABDOMINAL HYSTERECTOMY     BACK SURGERY     lower middle kyphoplasty   COLONOSCOPY WITH PROPOFOL  N/A 09/21/2015   Procedure: COLONOSCOPY WITH PROPOFOL ;  Surgeon: Deward CINDERELLA Piedmont, MD;  Location: [facility];  Service: Gastroenterology;  Laterality: N/A;   COLONOSCOPY WITH PROPOFOL  N/A 04/24/2020   Procedure: COLONOSCOPY WITH PROPOFOL ;  Surgeon: Maryruth Ole DASEN, MD;  Location: [facility];  Service: Endoscopy;  Laterality: N/A;   JOINT REPLACEMENT     03/19/2016- left knee replacement   REVERSE SHOULDER ARTHROPLASTY Right 08/25/2018   Procedure: REVERSE SHOULDER ARTHROPLASTY, RIGHT;  Surgeon: Edie Norleen PARAS, MD;  Location: [facility];  Service: Orthopedics;  Laterality: Right;   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   Procedure: TOTAL KNEE ARTHROPLASTY;  Surgeon: Norleen PARAS Edie, MD;  Location: [facility];  Service: Orthopedics;  Laterality: Left;   Patient Active Problem List   Diagnosis Date Noted   Lumbar foraminal stenosis 04/23/2023   Chronic pain syndrome 04/23/2023   Status post reverse total shoulder replacement, right 08/25/2018   Chronic left-sided low back pain with  left-sided sciatica 06/22/2018   Vitamin B 12 deficiency 01/02/2018   Chronic anemia 09/06/2017   Chronic constipation 06/08/2017   Lumbar radiculopathy 05/22/2017   Spinal stenosis, lumbar region, with neurogenic claudication 03/19/2017   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 03/10/2017   Acute bilateral low back pain without sciatica 02/04/2017   Dysuria 12/13/2016   Prediabetes 11/05/2016   Acquired hypothyroidism 08/24/2016   Myofascial pain syndrome 08/24/2016   Essential hypertension 08/24/2016   Osteopenia of multiple sites 08/24/2016   Lumbar degenerative disc disease 08/24/2016   Bilateral hydronephrosis 07/15/2016   Rotator cuff tendinitis, left 06/21/2016   Status post total knee replacement using cement, 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   Rotator cuff tendinitis, right 11/27/2015   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   Injury of tendon of long head of right biceps 11/27/2015   Incomplete tear of right rotator cuff 11/27/2015   Obstructive sleep apnea on CPAP 07/13/2015   Osteoarthritis 11/04/2013   Disorder of uterus 04/29/1995    PCP: Dr. Lavenia Beaver    REFERRING PROVIDER: Dr.Bilal Lateef    REFERRING DIAG:  M54.16 (ICD-10-CM) - Lumbar radiculopathy M48.061 (ICD-10-CM) - Lumbar foraminal stenosis M48.062 (ICD-10-CM) - Spinal stenosis, lumbar region, with neurogenic claudication G89.4 (ICD-10-CM) - Chronic pain syndrome  Rationale for Evaluation and Treatment: Rehabilitation  THERAPY DIAG:  Other low back pain  Chronic left-sided lumbar radiculopathy  ONSET DATE: June  2025   SUBJECTIVE:                                                                                                                                                                                           SUBJECTIVE STATEMENT: Pt reports that she continues to experience excruciating left sided low back pain that radiates down her left hip and beyond the lateral side of left  knee. Pain medications have not been helping significantly.    PERTINENT HISTORY:  Per Dr. Charolotte note on 02/10/24  Patient states that she is having increased lumbar radicular pain that is making it very challenging for her to walk. She is currently on hydrocodone  5 mg 3 times daily as needed as well as gabapentin  300 mg 3 times a day. I recommend a Medrol  Dosepak as below. She has an appointment already scheduled for September 11 at which point we will reevaluate. Future considerations could include bilateral L4 transforaminal ESI versus referral to neurosurgery for evaluation for surgical decompression at L4-L5.   PAIN:  Are you having pain? Yes: NPRS scale: 6-7/10   Pain location: Left Buttocks down posterior thigh into anterior ankle    Pain description: Throbbing    Aggravating factors: Bending forward to pick up something    Relieving factors: Pain medication helps    PRECAUTIONS: None  RED FLAGS: None   WEIGHT BEARING RESTRICTIONS: No  FALLS:  Has patient fallen in last 6 months? No  LIVING ENVIRONMENT: Lives with: lives alone Lives in: House/apartment Stairs: No Has following equipment at home: Single point cane and Environmental Consultant - 4 wheeled  OCCUPATION: Retired    PLOF: Independent  PATIENT GOALS: Pt wants to feel less pain so she can return to shopping and cooking without being limited by pain.    NEXT MD VISIT: Did not ask    OBJECTIVE:  Note: Objective measures were completed at Evaluation unless otherwise noted.  VITALS BP  137/66 HR 91 SpO2  100%  DIAGNOSTIC FINDINGS:  CLINICAL DATA:  Left lower extremity radiculopathy.   EXAM: MRI LUMBAR SPINE WITHOUT CONTRAST   TECHNIQUE: Multiplanar, multisequence MR imaging of the lumbar spine was performed. No intravenous contrast was administered.   COMPARISON:  MRI of the lumbar spine dated March 18, 2017.   FINDINGS: Segmentation:  5 non rib-bearing lumbar type vertebrae.   Alignment: Grade 1 anterolisthesis  at L4-5 and slight retrolisthesis at L2-3 and L3-4.   Vertebrae: Chronic moderate to severe compression deformity of L2 status post augmentation. There is persistent retropulsion of the posterior wall causing moderate central spinal canal stenosis, as before. Mild interval  worsening of degenerative anterolisthesis at L4-5. No osseous lesions.   Conus medullaris and cauda equina: Conus extends to the L1-2 level. Conus and cauda equina appear normal.   Paraspinal and other soft tissues: There are bilateral parapelvic renal cysts present. No follow-up is necessary. The paraspinous soft tissues are otherwise unremarkable.   Disc levels:   T12-L1: Normal.   L1-2: Mild disc bulging and retropulsion of the posterosuperior corner of L2, resulting in moderate central spinal canal stenosis. The neural foramina are unremarkable. There is no nerve root impingement.   L2-3: Broad-based disc bulging and bilateral facet hypertrophy, causing moderate central spinal canal stenosis and moderate bilateral lateral recess stenosis. No definite nerve root impingement.   L3-4: Broad-based disc bulging and bilateral facet hypertrophy, with moderate central spinal canal stenosis and mild-to-moderate bilateral lateral recess stenosis. No apparent nerve root impingement.   L4-5: Grade 1 anterolisthesis with marked bilateral facet arthrosis, causing severe central spinal canal stenosis and bilateral lateral recess stenosis, with impingement of the L5 nerves in the lateral recesses bilaterally. There is also moderate bilateral neural foraminal stenosis.   L5-S1: Mild disc bulging and bilateral facet hypertrophy, with mild central spinal canal stenosis and mild bilateral neural foraminal stenosis.   IMPRESSION: 1. Interval worsening of degenerative anterolisthesis at L4-5, now with severe central spinal canal stenosis, bilateral lateral recess stenosis and impingement of the L5 nerves  bilaterally. 2. Chronic moderate to severe compression deformity of L2 status post augmentation. Persistent moderate central spinal canal stenosis.     Electronically Signed   By: Evalene Coho M.D.   On: 01/14/2024 09:57  PATIENT SURVEYS:  Modified Oswestry:  MODIFIED OSWESTRY DISABILITY SCALE  Date: 04/01/24 Score  Pain intensity 4 =  Pain medication provides me with little relief from pain.  2. Personal care (washing, dressing, etc.) 3 =  I need help, but I am able to manage most of my personal care.  3. Lifting 5 =  I cannot lift or carry anything at all.  4. Walking 4 = I can only walk with crutches or a cane.  5. Sitting 2 =  Pain prevents me from sitting more than 1 hour.  6. Standing 2 =  Pain prevents me from standing more than 1 hour  7. Sleeping 2 =  Even when I take pain medication, I sleep less than 6 hours  8. Social Life 4 =  Pain has restricted my social life to my home  9. Traveling 5 = My pain prevents all travel except for visits to the physician/therapist or hospital  10. Employment/ Homemaking 4 = Pain prevents me from doing even light duties.  Total 35/50 (70%)   Interpretation of scores: Score Category Description  0-20% Minimal Disability The patient can cope with most living activities. Usually no treatment is indicated apart from advice on lifting, sitting and exercise  21-40% Moderate Disability The patient experiences more pain and difficulty with sitting, lifting and standing. Travel and social life are more difficult and they may be disabled from work. Personal care, sexual activity and sleeping are not grossly affected, and the patient can usually be managed by conservative means  41-60% Severe Disability Pain remains the main problem in this group, but activities of daily living are affected. These patients require a detailed investigation  61-80% Crippled Back pain impinges on all aspects of the patient's life. Positive intervention is required   81-100% Bed-bound These patients are either bed-bound or exaggerating their symptoms  Bluford FOR[place]  A, Booth A, et al. Surgery versus conservative management of stable thoracolumbar fracture: the PRESTO feasibility RCT. [place] ([place]): [employer]; 2021 Nov. [facility] Technology Assessment, No. 25.62.) Appendix 3, Oswestry Disability Index category descriptors. Available from: Findjewelers.cz  Minimally Clinically Important Difference (MCID) = 12.8%  COGNITION: Overall cognitive status: Within functional limits for tasks assessed     SENSATION: WFL  MUSCLE LENGTH: Hamstrings: Right 90 deg; Left 70 deg* Thomas test: Not performed    POSTURE: rounded shoulders and forward head  PALPATION:   LUMBAR ROM:   AROM eval  Flexion 100%*  Extension 100%  Right lateral flexion 100%  Left lateral flexion 100%  Right rotation 100%  Left rotation 100%   (Blank rows = not tested)  LOWER EXTREMITY ROM:     Active  Right eval Left eval  Hip flexion    Hip extension    Hip abduction    Hip adduction    Hip internal rotation    Hip external rotation    Knee flexion    Knee extension    Ankle dorsiflexion    Ankle plantarflexion    Ankle inversion    Ankle eversion     (Blank rows = not tested)  LOWER EXTREMITY MMT:    MMT Right eval Left eval  Hip flexion 4 4-*  Hip extension    Hip External Rotation in Sitting 4 4  Hip adduction 4 4  Hip internal rotation    Knee flexion 4 4  Knee extension 4 4  Ankle dorsiflexion 4 4  Ankle plantarflexion    Ankle inversion    Ankle eversion     (Blank rows = not tested)  LUMBAR SPECIAL TESTS:  Straight leg raise test: Positive  FUNCTIONAL TESTS:  5 times sit to stand: NT  30 seconds chair stand test Timed up and go (TUG): NT 2 minute walk test: NT 10 meter walk test: NT  GAIT: Distance walked: 50 ft   Assistive device utilized: Environmental Consultant - 4 wheeled Level of assistance:  Modified independence Comments: Decreased step length bilaterally   TREATMENT DATE:   04/13/24  THEREX: Use of TENS throughout therex   TENS with two channel mode and box formation with setting at  5   -Educated on use of TENS using  jokerule.[email or web address]   Discussed contra-indications, type of TENS needs to have 2 channel, pad placement, and not to use while sleeping, showering, etc.  -Pt reports decrease in left sided hip and leg pain to 7/10 NRPS from 10/10 NRPS  Lumbar Flexion AROM using Silver [facility] Center Right and Center Left 3 x 30     -Pt reports an increase in how far down pain is radiating down her right leg    Lumbar Extension Repeated Motions in Standing 1 x 5   -Pt reports increase in pain  Seated Marches with BUE support using BUE support 2 x 10 on LLE   Standing Marches non-alternating BUE support 2 x 10 on LLE    -Pt reports NRPS 5/10 and able to perform exercises without be limited by pain  Standing Quarter Squats with BUE support 2 x 10   -Pt reports NRPS 5/10 and able to perform exercise.   PATIENT EDUCATION:  Education details: Form and technique for correct performance of exercise and explanation about deficits resulting from L4 nerve impingement  Person educated: Patient Education method: Explanation, Demonstration, Verbal cues, and Handouts Education comprehension: verbalized understanding, returned demonstration, and verbal cues required  HOME EXERCISE PROGRAM: Access Code: [email or web address] URL: [email or web address] Date: 04/01/2024 Prepared by: Toribio Servant  Exercises - Hooklying Single Knee to Chest Stretch  - 1 x daily - 7 x weekly - 2 sets - 10 reps - 3 sec  hold  ASSESSMENT:  CLINICAL IMPRESSION: Patient is a 83 y.o. AA female who was seen today for physical therapy evaluation and treatment for subacute left sided radiculopathy. She has signs and symptoms that make her most  appropriate for the symptom modulation treatment based classification group with high pain and disability. She has no clear directional preference, but flexion is worse than extension. Pt has deficits that include decreased LE strength, mobility, and increased pain with movement.PT limited in exercises due to pt's high pain provocation. She will benefit from skilled PT to address these aforementioned deficits to return to walking and standing for prolonged periods of time to be able to return to shopping and cooking to remain independent and to improve quality of life.    OBJECTIVE IMPAIRMENTS: Abnormal gait, decreased balance, decreased coordination, decreased knowledge of condition, decreased mobility, difficulty walking, decreased ROM, decreased strength, hypomobility, impaired flexibility, impaired UE functional use, postural dysfunction, obesity, and pain.   ACTIVITY LIMITATIONS: carrying, lifting, bending, standing, squatting, sleeping, stairs, transfers, bed mobility, bathing, toileting, dressing, hygiene/grooming, and locomotion level  PARTICIPATION LIMITATIONS: meal prep, cleaning, laundry, shopping, community activity, and church  PERSONAL FACTORS: Age, Fitness, and 3+ comorbidities: HTN, severe right knee OA, chronic pain syndrome are also affecting patient's functional outcome.   REHAB POTENTIAL: Good  CLINICAL DECISION MAKING: Stable/uncomplicated  EVALUATION COMPLEXITY: Low   GOALS: Goals reviewed with patient? No  SHORT TERM GOALS: Target date: 04/15/2024  Patient will demonstrate undestanding of home exercise plan by performing exercises correctly with evidence of good carry over with min to no verbal or tactile cues .   Baseline: NT  Goal status: ONGOING   2.  Patient will demonstrate understanding of how to use TENS unit to relieve left sided radiating low back pain with movement by independently placing pads on her low back in box pattern and turning on unit to proper  setting.  Baseline: NT  04/13/24: Pt educated on use of TENS with use of handout  Goal status: ACHIEVED     LONG TERM GOALS: Target date: 06/24/2024  Patient will improve modified Oswestry Disability Index (MODI) score by decreasing initial score by >=13% as evidence of the minimal statistically significant change for improvement with low back pain disability and improvement in low back function (Copay et al, 2008) Baseline: 70% (35/50) Goal status: ONGOING   2.  Patient will be able to perform supine<>transfer with supervision as evidence of improved mobility and core and hip strength to remain independent.  Baseline: min A to help transfer from side lying to sit  Goal status: ONGOING    3.  Patient will perform TUG test in <12 sec as evidence of improved LLE function and mobility to decrease risk of falling and to continue to remain independent.   Baseline: NT   Goal status: ONGOING   4. Pt will increase by at least 12.2 m (40 ft) in order to demonstrate clinically significant improvement in cardiopulmonary endurance and community ambulation. Normie & Debby, 2009). Baseline: NT  Goal status: ONGOING   5.  Patient will be able to perform sit to stand without use of UE support from 18 inch mat height as evidence of improved LE strength.   Baseline: Needs use of BUE support  to stand up from a seated position  Goal status: ONGOING    PLAN:  PT FREQUENCY: 1-2x/week  PT DURATION: 12 weeks  PLANNED INTERVENTIONS: 97164- PT Re-evaluation, 97750- Physical Performance Testing, 97110-Therapeutic exercises, 97530- Therapeutic activity, 97112- Neuromuscular re-education, 97535- Self Care, [id number]- Manual therapy, [phone]- Gait training, [phone]- Orthotic Initial, [phone]- Aquatic Therapy, [phone]- Electrical stimulation (unattended), [phone]- Electrical stimulation (manual), [id number]- Traction (mechanical), 20560 (1-2 muscles), 20561 (3+ muscles)- Dry Needling, Patient/Family education, Balance  training, Stair training, Taping, Joint mobilization, Joint manipulation, Spinal manipulation, Spinal mobilization, Vestibular training, DME instructions, Cryotherapy, and Moist heat.  PLAN FOR NEXT SESSION: TUG test and . Ongoing use of TENS to promote movement:lower trunk rotation and ongoing standing hip strengthening exercises.   Toribio Servant PT, DPT  [facility] [address] [place], [address] Phone: [phone]   Fax:  [phone]

## 2024-04-15 ENCOUNTER — Ambulatory Visit: Admitting: Physical Therapy

## 2024-04-15 DIAGNOSIS — M5416 Radiculopathy, lumbar region: Secondary | ICD-10-CM

## 2024-04-15 DIAGNOSIS — M5459 Other low back pain: Secondary | ICD-10-CM | POA: Diagnosis not present

## 2024-04-15 NOTE — Therapy (Signed)
 OUTPATIENT PHYSICAL THERAPY THORACOLUMBAR TREATMENT    Patient Name: Erika Crawford MRN: [id number] DOB:[birth year], 83 y.o., female Today's Date: 04/15/2024  END OF SESSION:  PT End of Session - 04/15/24 1206     Visit Number 3    Number of Visits 24    Date for Recertification  06/24/24    Authorization Type [employer] Dual Medicare    Authorization - Visit Number 3    Progress Note Due on Visit 10    PT Start Time 1115    PT Stop Time 1200    PT Time Calculation (min) 45 min    Equipment Utilized During Treatment Gait belt    Activity Tolerance Patient tolerated treatment well    Behavior During Therapy WFL for tasks assessed/performed            Past Medical History:  Diagnosis Date   Arthritis    Complication of anesthesia    dizziness   Hypertension    Hypothyroidism    Sleep apnea    cpap   Thyroid disease    Past Surgical History:  Procedure Laterality Date   ABDOMINAL HYSTERECTOMY     BACK SURGERY     lower middle kyphoplasty   COLONOSCOPY WITH PROPOFOL  N/A 09/21/2015   Procedure: COLONOSCOPY WITH PROPOFOL ;  Surgeon: Deward CINDERELLA Piedmont, MD;  Location: [facility];  Service: Gastroenterology;  Laterality: N/A;   COLONOSCOPY WITH PROPOFOL  N/A 04/24/2020   Procedure: COLONOSCOPY WITH PROPOFOL ;  Surgeon: Maryruth Ole DASEN, MD;  Location: [facility];  Service: Endoscopy;  Laterality: N/A;   JOINT REPLACEMENT     03/19/2016- left knee replacement   REVERSE SHOULDER ARTHROPLASTY Right 08/25/2018   Procedure: REVERSE SHOULDER ARTHROPLASTY, RIGHT;  Surgeon: Edie Norleen PARAS, MD;  Location: [facility];  Service: Orthopedics;  Laterality: Right;   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   Procedure: TOTAL KNEE ARTHROPLASTY;  Surgeon: Norleen PARAS Edie, MD;  Location: [facility];  Service: Orthopedics;  Laterality: Left;   Patient Active Problem List   Diagnosis Date Noted   Lumbar foraminal stenosis 04/23/2023   Chronic pain syndrome 04/23/2023   Status post reverse total shoulder  replacement, right 08/25/2018   Chronic left-sided low back pain with left-sided sciatica 06/22/2018   Vitamin B 12 deficiency 01/02/2018   Chronic anemia 09/06/2017   Chronic constipation 06/08/2017   Lumbar radiculopathy 05/22/2017   Spinal stenosis, lumbar region, with neurogenic claudication 03/19/2017   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 03/10/2017   Acute bilateral low back pain without sciatica 02/04/2017   Dysuria 12/13/2016   Prediabetes 11/05/2016   Acquired hypothyroidism 08/24/2016   Myofascial pain syndrome 08/24/2016   Essential hypertension 08/24/2016   Osteopenia of multiple sites 08/24/2016   Lumbar degenerative disc disease 08/24/2016   Bilateral hydronephrosis 07/15/2016   Rotator cuff tendinitis, left 06/21/2016   Status post total knee replacement using cement, 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   Rotator cuff tendinitis, right 11/27/2015   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   Injury of tendon of long head of right biceps 11/27/2015   Incomplete tear of right rotator cuff 11/27/2015   Obstructive sleep apnea on CPAP 07/13/2015   Osteoarthritis 11/04/2013   Disorder of uterus 04/29/1995    PCP: Dr. Lavenia Beaver    REFERRING PROVIDER: Dr.Bilal Lateef    REFERRING DIAG:  M54.16 (ICD-10-CM) - Lumbar radiculopathy M48.061 (ICD-10-CM) - Lumbar foraminal stenosis M48.062 (ICD-10-CM) - Spinal stenosis, lumbar region, with neurogenic claudication G89.4 (ICD-10-CM) - Chronic pain syndrome  Rationale  for Evaluation and Treatment: Rehabilitation  THERAPY DIAG:  Other low back pain  Chronic left-sided lumbar radiculopathy  ONSET DATE: June  2025   SUBJECTIVE:                                                                                                                                                                                           SUBJECTIVE STATEMENT:  Pt reports ongoing improvement in her pain since trying TENS unit. She is  checking with insurance to whether they cover the TENS unit.   PERTINENT HISTORY:  Per Dr. Charolotte note on 02/10/24  Patient states that she is having increased lumbar radicular pain that is making it very challenging for her to walk. She is currently on hydrocodone  5 mg 3 times daily as needed as well as gabapentin  300 mg 3 times a day. I recommend a Medrol  Dosepak as below. She has an appointment already scheduled for September 11 at which point we will reevaluate. Future considerations could include bilateral L4 transforaminal ESI versus referral to neurosurgery for evaluation for surgical decompression at L4-L5.   PAIN:  Are you having pain? Yes: NPRS scale: 5-6/10   Pain location: Left Buttocks down posterior thigh into anterior ankle    Pain description: Throbbing    Aggravating factors: Bending forward to pick up something    Relieving factors: Pain medication helps    PRECAUTIONS: None  RED FLAGS: None   WEIGHT BEARING RESTRICTIONS: No  FALLS:  Has patient fallen in last 6 months? No  LIVING ENVIRONMENT: Lives with: lives alone Lives in: House/apartment Stairs: No Has following equipment at home: Single point cane and Environmental Consultant - 4 wheeled  OCCUPATION: Retired    PLOF: Independent  PATIENT GOALS: Pt wants to feel less pain so she can return to shopping and cooking without being limited by pain.    NEXT MD VISIT: Did not ask    OBJECTIVE:  Note: Objective measures were completed at Evaluation unless otherwise noted.  VITALS BP  137/66 HR 91 SpO2  100%  DIAGNOSTIC FINDINGS:  CLINICAL DATA:  Left lower extremity radiculopathy.   EXAM: MRI LUMBAR SPINE WITHOUT CONTRAST   TECHNIQUE: Multiplanar, multisequence MR imaging of the lumbar spine was performed. No intravenous contrast was administered.   COMPARISON:  MRI of the lumbar spine dated March 18, 2017.   FINDINGS: Segmentation:  5 non rib-bearing lumbar type vertebrae.   Alignment: Grade 1 anterolisthesis  at L4-5 and slight retrolisthesis at L2-3 and L3-4.   Vertebrae: Chronic moderate to severe compression deformity of L2 status post augmentation. There is persistent retropulsion of the posterior wall causing moderate  central spinal canal stenosis, as before. Mild interval worsening of degenerative anterolisthesis at L4-5. No osseous lesions.   Conus medullaris and cauda equina: Conus extends to the L1-2 level. Conus and cauda equina appear normal.   Paraspinal and other soft tissues: There are bilateral parapelvic renal cysts present. No follow-up is necessary. The paraspinous soft tissues are otherwise unremarkable.   Disc levels:   T12-L1: Normal.   L1-2: Mild disc bulging and retropulsion of the posterosuperior corner of L2, resulting in moderate central spinal canal stenosis. The neural foramina are unremarkable. There is no nerve root impingement.   L2-3: Broad-based disc bulging and bilateral facet hypertrophy, causing moderate central spinal canal stenosis and moderate bilateral lateral recess stenosis. No definite nerve root impingement.   L3-4: Broad-based disc bulging and bilateral facet hypertrophy, with moderate central spinal canal stenosis and mild-to-moderate bilateral lateral recess stenosis. No apparent nerve root impingement.   L4-5: Grade 1 anterolisthesis with marked bilateral facet arthrosis, causing severe central spinal canal stenosis and bilateral lateral recess stenosis, with impingement of the L5 nerves in the lateral recesses bilaterally. There is also moderate bilateral neural foraminal stenosis.   L5-S1: Mild disc bulging and bilateral facet hypertrophy, with mild central spinal canal stenosis and mild bilateral neural foraminal stenosis.   IMPRESSION: 1. Interval worsening of degenerative anterolisthesis at L4-5, now with severe central spinal canal stenosis, bilateral lateral recess stenosis and impingement of the L5 nerves  bilaterally. 2. Chronic moderate to severe compression deformity of L2 status post augmentation. Persistent moderate central spinal canal stenosis.     Electronically Signed   By: Evalene Coho M.D.   On: 01/14/2024 09:57  PATIENT SURVEYS:  Modified Oswestry:  MODIFIED OSWESTRY DISABILITY SCALE  Date: 04/01/24 Score  Pain intensity 4 =  Pain medication provides me with little relief from pain.  2. Personal care (washing, dressing, etc.) 3 =  I need help, but I am able to manage most of my personal care.  3. Lifting 5 =  I cannot lift or carry anything at all.  4. Walking 4 = I can only walk with crutches or a cane.  5. Sitting 2 =  Pain prevents me from sitting more than 1 hour.  6. Standing 2 =  Pain prevents me from standing more than 1 hour  7. Sleeping 2 =  Even when I take pain medication, I sleep less than 6 hours  8. Social Life 4 =  Pain has restricted my social life to my home  9. Traveling 5 = My pain prevents all travel except for visits to the physician/therapist or hospital  10. Employment/ Homemaking 4 = Pain prevents me from doing even light duties.  Total 35/50 (70%)   Interpretation of scores: Score Category Description  0-20% Minimal Disability The patient can cope with most living activities. Usually no treatment is indicated apart from advice on lifting, sitting and exercise  21-40% Moderate Disability The patient experiences more pain and difficulty with sitting, lifting and standing. Travel and social life are more difficult and they may be disabled from work. Personal care, sexual activity and sleeping are not grossly affected, and the patient can usually be managed by conservative means  41-60% Severe Disability Pain remains the main problem in this group, but activities of daily living are affected. These patients require a detailed investigation  61-80% Crippled Back pain impinges on all aspects of the patient's life. Positive intervention is required   81-100% Bed-bound These patients are either bed-bound  or exaggerating their symptoms  Bluford FORBES Zoe DELENA Karon DELENA, et al. Surgery versus conservative management of stable thoracolumbar fracture: the PRESTO feasibility RCT. [place] ([place]): [employer]; 2021 Nov. [facility] Technology Assessment, No. 25.62.) Appendix 3, Oswestry Disability Index category descriptors. Available from: Findjewelers.cz  Minimally Clinically Important Difference (MCID) = 12.8%  COGNITION: Overall cognitive status: Within functional limits for tasks assessed     SENSATION: WFL  MUSCLE LENGTH: Hamstrings: Right 90 deg; Left 70 deg* Thomas test: Not performed    POSTURE: rounded shoulders and forward head  PALPATION:   LUMBAR ROM:   AROM eval  Flexion 100%*  Extension 100%  Right lateral flexion 100%  Left lateral flexion 100%  Right rotation 100%  Left rotation 100%   (Blank rows = not tested)  LOWER EXTREMITY ROM:     Active  Right eval Left eval  Hip flexion    Hip extension    Hip abduction    Hip adduction    Hip internal rotation    Hip external rotation    Knee flexion    Knee extension    Ankle dorsiflexion    Ankle plantarflexion    Ankle inversion    Ankle eversion     (Blank rows = not tested)  LOWER EXTREMITY MMT:    MMT Right eval Left eval  Hip flexion 4 4-*  Hip extension    Hip External Rotation in Sitting 4 4  Hip adduction 4 4  Hip internal rotation    Knee flexion 4 4  Knee extension 4 4  Ankle dorsiflexion 4 4  Ankle plantarflexion    Ankle inversion    Ankle eversion     (Blank rows = not tested)  LUMBAR SPECIAL TESTS:  Straight leg raise test: Positive  FUNCTIONAL TESTS:  5 times sit to stand: NT  30 seconds chair stand test Timed up and go (TUG): NT 2 minute walk test: NT 10 meter walk test: NT  GAIT: Distance walked: 50 ft   Assistive device utilized: Walker - 4 wheeled Level of assistance:  Modified independence Comments: Decreased step length bilaterally   TREATMENT DATE:   04/15/24: THEREX    TUG: 13.70 sec with use of 2WW  : 312 feet 2WW performed in 10 meter distance format.    USE TENS with two channel mode and box formation with setting at  5   Hook Lying Lower Trunk Rotation 2 x 10   -NRPS 4/10  Hook Lying TA 3 sec hold 2 x 10    -NRPS 4/10   Hook Lying Marches 2 x 10   -NRPS 4/10   No TENS  with lower trunk rotation 2 x 10  -NRPS  4/10   TENS turned on    Sit to stand from 22 mat height UE support 1 x 10   Sit to stand from 24 mat height with UE support 1 x 10  -NRPS 8/10   Standing hip abduction on LLE with BUE support 1 x 10  -NRPS 5-6/10   -min VC to abduct left leg less Quarter Squat with BUE support  -mod VC to keep knees behind toes    -mat raised for external cueing   Standing HS curl on LLE with BUE support  1 x 10   -pt reports increased left hip pain  Standing HS curl on LLE with BUE support 1 x 10    -min VC to decrease ROM of knee flexion  1 x  10    -Pt report decrease in LLE pain   Standing heel raises with BUE support 2 x 10      PATIENT EDUCATION:  Education details: Form and technique for correct performance of exercise and explanation about deficits resulting from L4 nerve impingement  Person educated: Patient Education method: Explanation, Demonstration, Verbal cues, and Handouts Education comprehension: verbalized understanding, returned demonstration, and verbal cues required  HOME EXERCISE PROGRAM: Access Code: [email or web address] URL: [email or web address] Date: 04/15/2024 Prepared by: Toribio Servant  Exercises - Hooklying Single Knee to Chest Stretch  - 1 x daily - 7 x weekly - 2 sets - 10 reps - 3 sec  hold - Supine Lower Trunk Rotation  - 1 x daily - 7 x weekly - 2 sets - 10 reps - 30 sec  hold - Supine Transversus Abdominis Bracing - Hands on Stomach  - 1 x daily - 7 x weekly - 2 sets - 10 reps - 3 sec hold -  Standing March with Unilateral Counter Support  - 5-7 x weekly - 2 sets - 10 reps - Quarter Squat with butt tap on bed but use hands on walker for support  - 5-7 x weekly - 2 sets - 10 reps - Standing Heel Raise with Support  - 5-7 x weekly - 2 sets - 10 reps - Standing Knee Flexion AROM with Chair Support  - 5-7 x weekly - 2 sets - 10 reps ASSESSMENT:  CLINICAL IMPRESSION: Pt continues to show improvement with decrease in pain with use of TENS and even without use of TENS for certain exercises. She does continue to be limited by pain when performing sit to stands and when abducting hip even with use of TENS. PT increased strengthening exercises to see if patient could tolerate while performing at home with focus on reduced sets and reps with increased frequency, She will continue to benefit from skilled PT to reduce her left sided low back pain and improve her mobility, os that she is not limited by pain.   OBJECTIVE IMPAIRMENTS: Abnormal gait, decreased balance, decreased coordination, decreased knowledge of condition, decreased mobility, difficulty walking, decreased ROM, decreased strength, hypomobility, impaired flexibility, impaired UE functional use, postural dysfunction, obesity, and pain.   ACTIVITY LIMITATIONS: carrying, lifting, bending, standing, squatting, sleeping, stairs, transfers, bed mobility, bathing, toileting, dressing, hygiene/grooming, and locomotion level  PARTICIPATION LIMITATIONS: meal prep, cleaning, laundry, shopping, community activity, and church  PERSONAL FACTORS: Age, Fitness, and 3+ comorbidities: HTN, severe right knee OA, chronic pain syndrome are also affecting patient's functional outcome.   REHAB POTENTIAL: Good  CLINICAL DECISION MAKING: Stable/uncomplicated  EVALUATION COMPLEXITY: Low   GOALS: Goals reviewed with patient? No  SHORT TERM GOALS: Target date: 04/15/2024  Patient will demonstrate undestanding of home exercise plan by performing  exercises correctly with evidence of good carry over with min to no verbal or tactile cues .   Baseline: NT  Goal status: ONGOING   2.  Patient will demonstrate understanding of how to use TENS unit to relieve left sided radiating low back pain with movement by independently placing pads on her low back in box pattern and turning on unit to proper setting.  Baseline: NT  04/13/24: Pt educated on use of TENS with use of handout  Goal status: ACHIEVED     LONG TERM GOALS: Target date: 06/24/2024  Patient will improve modified Oswestry Disability Index (MODI) score by decreasing initial score by >=13% as evidence of the minimal statistically significant change for improvement  with low back pain disability and improvement in low back function (Copay et al, 2008) Baseline: 70% (35/50) Goal status: ONGOING   2.  Patient will be able to perform supine<>transfer with supervision as evidence of improved mobility and core and hip strength to remain independent.  Baseline: min A to help transfer from side lying to sit  Goal status: ONGOING    3.  Patient will perform TUG test in <12 sec as evidence of improved LLE function and mobility to decrease risk of falling and to continue to remain independent.   Baseline: 13.70 sec   Goal status: ONGOING   4. Pt will increase by at least 12.2 m (40 ft) in order to demonstrate clinically significant improvement in cardiopulmonary endurance and community ambulation. Normie & Debby, 2009). Baseline: 312 feet (9.5 x 10 M walk with rollator)   Goal status: ONGOING   5.  Patient will be able to perform sit to stand without use of UE support from 18 inch mat height as evidence of improved LE strength.   Baseline: Needs use of BUE support to stand up from a seated position  Goal status: ONGOING    PLAN:  PT FREQUENCY: 1-2x/week  PT DURATION: 12 weeks  PLANNED INTERVENTIONS: 97164- PT Re-evaluation, 97750- Physical Performance Testing,  97110-Therapeutic exercises, 97530- Therapeutic activity, 97112- Neuromuscular re-education, 97535- Self Care, 02859- Manual therapy, [id number]- Gait training, [phone]- Orthotic Initial, [phone]- Aquatic Therapy, [phone]- Electrical stimulation (unattended), [phone]- Electrical stimulation (manual), [id number]- Traction (mechanical), 20560 (1-2 muscles), 20561 (3+ muscles)- Dry Needling, Patient/Family education, Balance training, Stair training, Taping, Joint mobilization, Joint manipulation, Spinal manipulation, Spinal mobilization, Vestibular training, DME instructions, Cryotherapy, and Moist heat.  PLAN FOR NEXT SESSION: Ongoing use of TENS to promote movement: focus on nerve glides on LLE and modify sit to stand so that patient can perform without limitations with neuropathic pain.   Toribio Servant PT, DPT  [facility] [address], [place], [address] Phone: [phone]   Fax:  [phone]

## 2024-04-15 NOTE — Therapy (Incomplete Revision)
 OUTPATIENT PHYSICAL THERAPY THORACOLUMBAR TREATMENT    Patient Name: Erika Crawford MRN: [id number] DOB:[birth year], 83 y.o., female Today's Date: 04/15/2024  END OF SESSION:  PT End of Session - 04/15/24 1206     Visit Number 3    Number of Visits 24    Date for Recertification  06/24/24    Authorization Type [employer] Dual Medicare    Authorization - Visit Number 3    Progress Note Due on Visit 10    PT Start Time 1115    PT Stop Time 1200    PT Time Calculation (min) 45 min    Equipment Utilized During Treatment Gait belt    Activity Tolerance Patient tolerated treatment well    Behavior During Therapy WFL for tasks assessed/performed            Past Medical History:  Diagnosis Date   Arthritis    Complication of anesthesia    dizziness   Hypertension    Hypothyroidism    Sleep apnea    cpap   Thyroid disease    Past Surgical History:  Procedure Laterality Date   ABDOMINAL HYSTERECTOMY     BACK SURGERY     lower middle kyphoplasty   COLONOSCOPY WITH PROPOFOL  N/A 09/21/2015   Procedure: COLONOSCOPY WITH PROPOFOL ;  Surgeon: Deward CINDERELLA Piedmont, MD;  Location: [facility];  Service: Gastroenterology;  Laterality: N/A;   COLONOSCOPY WITH PROPOFOL  N/A 04/24/2020   Procedure: COLONOSCOPY WITH PROPOFOL ;  Surgeon: Maryruth Ole DASEN, MD;  Location: [facility];  Service: Endoscopy;  Laterality: N/A;   JOINT REPLACEMENT     03/19/2016- left knee replacement   REVERSE SHOULDER ARTHROPLASTY Right 08/25/2018   Procedure: REVERSE SHOULDER ARTHROPLASTY, RIGHT;  Surgeon: Edie Norleen PARAS, MD;  Location: [facility];  Service: Orthopedics;  Laterality: Right;   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   Procedure: TOTAL KNEE ARTHROPLASTY;  Surgeon: Norleen PARAS Edie, MD;  Location: [facility];  Service: Orthopedics;  Laterality: Left;   Patient Active Problem List   Diagnosis Date Noted   Lumbar foraminal stenosis 04/23/2023   Chronic pain syndrome 04/23/2023   Status post reverse total shoulder  replacement, right 08/25/2018   Chronic left-sided low back pain with left-sided sciatica 06/22/2018   Vitamin B 12 deficiency 01/02/2018   Chronic anemia 09/06/2017   Chronic constipation 06/08/2017   Lumbar radiculopathy 05/22/2017   Spinal stenosis, lumbar region, with neurogenic claudication 03/19/2017   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 03/10/2017   Acute bilateral low back pain without sciatica 02/04/2017   Dysuria 12/13/2016   Prediabetes 11/05/2016   Acquired hypothyroidism 08/24/2016   Myofascial pain syndrome 08/24/2016   Essential hypertension 08/24/2016   Osteopenia of multiple sites 08/24/2016   Lumbar degenerative disc disease 08/24/2016   Bilateral hydronephrosis 07/15/2016   Rotator cuff tendinitis, left 06/21/2016   Status post total knee replacement using cement, 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   Rotator cuff tendinitis, right 11/27/2015   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   Injury of tendon of long head of right biceps 11/27/2015   Incomplete tear of right rotator cuff 11/27/2015   Obstructive sleep apnea on CPAP 07/13/2015   Osteoarthritis 11/04/2013   Disorder of uterus 04/29/1995    PCP: Dr. Lavenia Beaver    REFERRING PROVIDER: Dr.Bilal Lateef    REFERRING DIAG:  M54.16 (ICD-10-CM) - Lumbar radiculopathy M48.061 (ICD-10-CM) - Lumbar foraminal stenosis M48.062 (ICD-10-CM) - Spinal stenosis, lumbar region, with neurogenic claudication G89.4 (ICD-10-CM) - Chronic pain syndrome  Rationale  for Evaluation and Treatment: Rehabilitation  THERAPY DIAG:  Other low back pain  Chronic left-sided lumbar radiculopathy  ONSET DATE: June  2025   SUBJECTIVE:                                                                                                                                                                                           SUBJECTIVE STATEMENT:  Pt reports ongoing improvement in her pain since trying TENS unit. She is  checking with insurance to whether they cover the TENS unit.   PERTINENT HISTORY:  Per Dr. Charolotte note on 02/10/24  Patient states that she is having increased lumbar radicular pain that is making it very challenging for her to walk. She is currently on hydrocodone  5 mg 3 times daily as needed as well as gabapentin  300 mg 3 times a day. I recommend a Medrol  Dosepak as below. She has an appointment already scheduled for September 11 at which point we will reevaluate. Future considerations could include bilateral L4 transforaminal ESI versus referral to neurosurgery for evaluation for surgical decompression at L4-L5.   PAIN:  Are you having pain? Yes: NPRS scale: 5-6/10   Pain location: Left Buttocks down posterior thigh into anterior ankle    Pain description: Throbbing    Aggravating factors: Bending forward to pick up something    Relieving factors: Pain medication helps    PRECAUTIONS: None  RED FLAGS: None   WEIGHT BEARING RESTRICTIONS: No  FALLS:  Has patient fallen in last 6 months? No  LIVING ENVIRONMENT: Lives with: lives alone Lives in: House/apartment Stairs: No Has following equipment at home: Single point cane and Environmental Consultant - 4 wheeled  OCCUPATION: Retired    PLOF: Independent  PATIENT GOALS: Pt wants to feel less pain so she can return to shopping and cooking without being limited by pain.    NEXT MD VISIT: Did not ask    OBJECTIVE:  Note: Objective measures were completed at Evaluation unless otherwise noted.  VITALS BP  137/66 HR 91 SpO2  100%  DIAGNOSTIC FINDINGS:  CLINICAL DATA:  Left lower extremity radiculopathy.   EXAM: MRI LUMBAR SPINE WITHOUT CONTRAST   TECHNIQUE: Multiplanar, multisequence MR imaging of the lumbar spine was performed. No intravenous contrast was administered.   COMPARISON:  MRI of the lumbar spine dated March 18, 2017.   FINDINGS: Segmentation:  5 non rib-bearing lumbar type vertebrae.   Alignment: Grade 1 anterolisthesis  at L4-5 and slight retrolisthesis at L2-3 and L3-4.   Vertebrae: Chronic moderate to severe compression deformity of L2 status post augmentation. There is persistent retropulsion of the posterior wall causing moderate  central spinal canal stenosis, as before. Mild interval worsening of degenerative anterolisthesis at L4-5. No osseous lesions.   Conus medullaris and cauda equina: Conus extends to the L1-2 level. Conus and cauda equina appear normal.   Paraspinal and other soft tissues: There are bilateral parapelvic renal cysts present. No follow-up is necessary. The paraspinous soft tissues are otherwise unremarkable.   Disc levels:   T12-L1: Normal.   L1-2: Mild disc bulging and retropulsion of the posterosuperior corner of L2, resulting in moderate central spinal canal stenosis. The neural foramina are unremarkable. There is no nerve root impingement.   L2-3: Broad-based disc bulging and bilateral facet hypertrophy, causing moderate central spinal canal stenosis and moderate bilateral lateral recess stenosis. No definite nerve root impingement.   L3-4: Broad-based disc bulging and bilateral facet hypertrophy, with moderate central spinal canal stenosis and mild-to-moderate bilateral lateral recess stenosis. No apparent nerve root impingement.   L4-5: Grade 1 anterolisthesis with marked bilateral facet arthrosis, causing severe central spinal canal stenosis and bilateral lateral recess stenosis, with impingement of the L5 nerves in the lateral recesses bilaterally. There is also moderate bilateral neural foraminal stenosis.   L5-S1: Mild disc bulging and bilateral facet hypertrophy, with mild central spinal canal stenosis and mild bilateral neural foraminal stenosis.   IMPRESSION: 1. Interval worsening of degenerative anterolisthesis at L4-5, now with severe central spinal canal stenosis, bilateral lateral recess stenosis and impingement of the L5 nerves  bilaterally. 2. Chronic moderate to severe compression deformity of L2 status post augmentation. Persistent moderate central spinal canal stenosis.     Electronically Signed   By: Evalene Coho M.D.   On: 01/14/2024 09:57  PATIENT SURVEYS:  Modified Oswestry:  MODIFIED OSWESTRY DISABILITY SCALE  Date: 04/01/24 Score  Pain intensity 4 =  Pain medication provides me with little relief from pain.  2. Personal care (washing, dressing, etc.) 3 =  I need help, but I am able to manage most of my personal care.  3. Lifting 5 =  I cannot lift or carry anything at all.  4. Walking 4 = I can only walk with crutches or a cane.  5. Sitting 2 =  Pain prevents me from sitting more than 1 hour.  6. Standing 2 =  Pain prevents me from standing more than 1 hour  7. Sleeping 2 =  Even when I take pain medication, I sleep less than 6 hours  8. Social Life 4 =  Pain has restricted my social life to my home  9. Traveling 5 = My pain prevents all travel except for visits to the physician/therapist or hospital  10. Employment/ Homemaking 4 = Pain prevents me from doing even light duties.  Total 35/50 (70%)   Interpretation of scores: Score Category Description  0-20% Minimal Disability The patient can cope with most living activities. Usually no treatment is indicated apart from advice on lifting, sitting and exercise  21-40% Moderate Disability The patient experiences more pain and difficulty with sitting, lifting and standing. Travel and social life are more difficult and they may be disabled from work. Personal care, sexual activity and sleeping are not grossly affected, and the patient can usually be managed by conservative means  41-60% Severe Disability Pain remains the main problem in this group, but activities of daily living are affected. These patients require a detailed investigation  61-80% Crippled Back pain impinges on all aspects of the patient's life. Positive intervention is required   81-100% Bed-bound These patients are either bed-bound  or exaggerating their symptoms  Bluford FORBES Zoe DELENA Karon DELENA, et al. Surgery versus conservative management of stable thoracolumbar fracture: the PRESTO feasibility RCT. [place] ([place]): [employer]; 2021 Nov. [facility] Technology Assessment, No. 25.62.) Appendix 3, Oswestry Disability Index category descriptors. Available from: Findjewelers.cz  Minimally Clinically Important Difference (MCID) = 12.8%  COGNITION: Overall cognitive status: Within functional limits for tasks assessed     SENSATION: WFL  MUSCLE LENGTH: Hamstrings: Right 90 deg; Left 70 deg* Thomas test: Not performed    POSTURE: rounded shoulders and forward head  PALPATION:   LUMBAR ROM:   AROM eval  Flexion 100%*  Extension 100%  Right lateral flexion 100%  Left lateral flexion 100%  Right rotation 100%  Left rotation 100%   (Blank rows = not tested)  LOWER EXTREMITY ROM:     Active  Right eval Left eval  Hip flexion    Hip extension    Hip abduction    Hip adduction    Hip internal rotation    Hip external rotation    Knee flexion    Knee extension    Ankle dorsiflexion    Ankle plantarflexion    Ankle inversion    Ankle eversion     (Blank rows = not tested)  LOWER EXTREMITY MMT:    MMT Right eval Left eval  Hip flexion 4 4-*  Hip extension    Hip External Rotation in Sitting 4 4  Hip adduction 4 4  Hip internal rotation    Knee flexion 4 4  Knee extension 4 4  Ankle dorsiflexion 4 4  Ankle plantarflexion    Ankle inversion    Ankle eversion     (Blank rows = not tested)  LUMBAR SPECIAL TESTS:  Straight leg raise test: Positive  FUNCTIONAL TESTS:  5 times sit to stand: NT  30 seconds chair stand test Timed up and go (TUG): NT 2 minute walk test: NT 10 meter walk test: NT  GAIT: Distance walked: 50 ft   Assistive device utilized: Walker - 4 wheeled Level of assistance:  Modified independence Comments: Decreased step length bilaterally   TREATMENT DATE:   04/15/24: THEREX    TUG: 13.70 sec with use of 2WW  : 312 feet 2WW performed in 10 meter distance format.    USE TENS with two channel mode and box formation with setting at  5   Hook Lying Lower Trunk Rotation 2 x 10   -NRPS 4/10  Hook Lying TA 3 sec hold 2 x 10    -NRPS 4/10   Hook Lying Marches 2 x 10   -NRPS 4/10   No TENS  with lower trunk rotation 2 x 10  -NRPS  4/10   TENS turned on    Sit to stand from 22 mat height UE support 1 x 10   Sit to stand from 24 mat height with UE support 1 x 10  -NRPS 8/10   Standing hip abduction on LLE with BUE support 1 x 10  -NRPS 5-6/10   -min VC to abduct left leg less Quarter Squat with BUE support  -mod VC to keep knees behind toes    -mat raised for external cueing   Standing HS curl on LLE with BUE support  1 x 10   -pt reports increased left hip pain  Standing HS curl on LLE with BUE support 1 x 10    -min VC to decrease ROM of knee flexion  1 x  10    -Pt report decrease in LLE pain   Standing heel raises with BUE support 2 x 10      PATIENT EDUCATION:  Education details: Form and technique for correct performance of exercise and explanation about deficits resulting from L4 nerve impingement  Person educated: Patient Education method: Explanation, Demonstration, Verbal cues, and Handouts Education comprehension: verbalized understanding, returned demonstration, and verbal cues required  HOME EXERCISE PROGRAM: Access Code: [email or web address] URL: [email or web address] Date: 04/15/2024 Prepared by: Toribio Servant  Exercises - Hooklying Single Knee to Chest Stretch  - 1 x daily - 7 x weekly - 2 sets - 10 reps - 3 sec  hold - Supine Lower Trunk Rotation  - 1 x daily - 7 x weekly - 2 sets - 10 reps - 30 sec  hold - Supine Transversus Abdominis Bracing - Hands on Stomach  - 1 x daily - 7 x weekly - 2 sets - 10 reps - 3 sec hold -  Standing March with Unilateral Counter Support  - 5-7 x weekly - 2 sets - 10 reps - Quarter Squat with butt tap on bed but use hands on walker for support  - 5-7 x weekly - 2 sets - 10 reps - Standing Heel Raise with Support  - 5-7 x weekly - 2 sets - 10 reps - Standing Knee Flexion AROM with Chair Support  - 5-7 x weekly - 2 sets - 10 reps ASSESSMENT:  CLINICAL IMPRESSION: Pt continues to show improvement with decrease in pain with use of TENS and even without use of TENS for certain exercises. She does continue to be limited by pain when performing sit to stands and when abducting hip even with use of TENS. PT increased strengthening exercises to see if patient could tolerate while performing at home with focus on reduced sets and reps with increased frequency, She will continue to benefit from skilled PT to reduce her left sided low back pain and improve her mobility, os that she is not limited by pain.   OBJECTIVE IMPAIRMENTS: Abnormal gait, decreased balance, decreased coordination, decreased knowledge of condition, decreased mobility, difficulty walking, decreased ROM, decreased strength, hypomobility, impaired flexibility, impaired UE functional use, postural dysfunction, obesity, and pain.   ACTIVITY LIMITATIONS: carrying, lifting, bending, standing, squatting, sleeping, stairs, transfers, bed mobility, bathing, toileting, dressing, hygiene/grooming, and locomotion level  PARTICIPATION LIMITATIONS: meal prep, cleaning, laundry, shopping, community activity, and church  PERSONAL FACTORS: Age, Fitness, and 3+ comorbidities: HTN, severe right knee OA, chronic pain syndrome are also affecting patient's functional outcome.   REHAB POTENTIAL: Good  CLINICAL DECISION MAKING: Stable/uncomplicated  EVALUATION COMPLEXITY: Low   GOALS: Goals reviewed with patient? No  SHORT TERM GOALS: Target date: 04/15/2024  Patient will demonstrate undestanding of home exercise plan by performing  exercises correctly with evidence of good carry over with min to no verbal or tactile cues .   Baseline: NT  Goal status: ONGOING   2.  Patient will demonstrate understanding of how to use TENS unit to relieve left sided radiating low back pain with movement by independently placing pads on her low back in box pattern and turning on unit to proper setting.  Baseline: NT  04/13/24: Pt educated on use of TENS with use of handout  Goal status: ACHIEVED     LONG TERM GOALS: Target date: 06/24/2024  Patient will improve modified Oswestry Disability Index (MODI) score by decreasing initial score by >=13% as evidence of the minimal statistically significant change for improvement  with low back pain disability and improvement in low back function (Copay et al, 2008) Baseline: 70% (35/50) Goal status: ONGOING   2.  Patient will be able to perform supine<>transfer with supervision as evidence of improved mobility and core and hip strength to remain independent.  Baseline: min A to help transfer from side lying to sit  Goal status: ONGOING    3.  Patient will perform TUG test in <12 sec as evidence of improved LLE function and mobility to decrease risk of falling and to continue to remain independent.   Baseline: 13.70 sec   Goal status: ONGOING   4. Pt will increase by at least 12.2 m (40 ft) in order to demonstrate clinically significant improvement in cardiopulmonary endurance and community ambulation. Normie & Debby, 2009). Baseline: 312 feet (9.5 x 10 M walk with rollator)   Goal status: ONGOING   5.  Patient will be able to perform sit to stand without use of UE support from 18 inch mat height as evidence of improved LE strength.   Baseline: Needs use of BUE support to stand up from a seated position  Goal status: ONGOING    PLAN:  PT FREQUENCY: 1-2x/week  PT DURATION: 12 weeks  PLANNED INTERVENTIONS: 97164- PT Re-evaluation, 97750- Physical Performance Testing,  97110-Therapeutic exercises, 97530- Therapeutic activity, 97112- Neuromuscular re-education, 97535- Self Care, 02859- Manual therapy, [id number]- Gait training, [phone]- Orthotic Initial, [phone]- Aquatic Therapy, [phone]- Electrical stimulation (unattended), [phone]- Electrical stimulation (manual), [id number]- Traction (mechanical), 20560 (1-2 muscles), 20561 (3+ muscles)- Dry Needling, Patient/Family education, Balance training, Stair training, Taping, Joint mobilization, Joint manipulation, Spinal manipulation, Spinal mobilization, Vestibular training, DME instructions, Cryotherapy, and Moist heat.  PLAN FOR NEXT SESSION: Ongoing use of TENS to promote movement: focus on nerve glides on LLE and modify sit to stand so that patient can perform without limitations with neuropathic pain.   Toribio Servant PT, DPT  [facility] [address], [place], [address] Phone: [phone]   Fax:  [phone]

## 2024-04-16 ENCOUNTER — Ambulatory Visit (INDEPENDENT_AMBULATORY_CARE_PROVIDER_SITE_OTHER): Admitting: Neurosurgery

## 2024-04-16 DIAGNOSIS — M48062 Spinal stenosis, lumbar region with neurogenic claudication: Secondary | ICD-10-CM

## 2024-04-16 NOTE — Progress Notes (Signed)
 I had a follow-up phone visit with Erika Crawford today.  She was at home and I was in the office.  She gave consent to go forward with a phone visit.  We discussed her progress with physical therapy.  She does feel like she continues to have a improvement with physical therapy and is overall pleased.  However she does complain that her pain is still present although it has had some slight improvement.  We discussed the risks and benefits of waiting and continuing with conservative care.  She would like to continue with conservative care for a few more weeks to see whether or not she gets any longstanding or more improvement given the time frame of her  Issues and continued symptomatology I feel that this is appropriate.  I would like to continue to follow-up with her.  I would like to have her come in to see me in another 3 to 4 weeks to see how she has been progressing overall.  At that time we will make a decision on further management.  We spent a total of 10 minutes discussing her care today.

## 2024-04-20 ENCOUNTER — Ambulatory Visit: Admitting: Physical Therapy

## 2024-04-22 ENCOUNTER — Telehealth: Payer: Self-pay

## 2024-04-22 ENCOUNTER — Ambulatory Visit: Admitting: Physical Therapy

## 2024-04-22 NOTE — Progress Notes (Addendum)
 Established Patient Visit   Chief Complaint: Chief Complaint  Patient presents with  . Follow-up    1 year    Date of Service: 04/22/2024 Date of Birth: September 28, 1940 PCP: Sherial Bail, MD [address] [place] [address]  History of Present Illness:   Erika Crawford is a 83 y.o.female patient that presents for 1 year f/u.    Presents for f/u r/t: OSA HTN Left carotid artery atherosclerosis with <50% stenosis by US  07/18/2021 PACs PVCs HLD Hypothyroidism  No MI/PCI hx. No CVA hx. No tobacco use hx.  She presents today for scheduled 1 year follow up. She denies chest pain, dyspnea, edema, or palpitations. She reports some mild dizziness which she attributes to being off balance with her left leg pain 2/2 spinal stenosis. She is following with Neurosurgery. She also reports some intermittent cloudy vision. Denies presyncope or syncope. Last 2D ECHO 02/16/2020 showed normal LV size and function with EF > 55%, normal RV function, trivial valvular regurgitation, no valvular stenosis. EKG today reveals sinus rhythm, HR 91, with 1 PVC. No acute ST-T wave changes.   Past Medical and Surgical History  Past Medical History Past Medical History:  Diagnosis Date  . Adenomatous polyps 2007   colonoscopy  . Allergic state   . Apnea   . Chickenpox   . GERD (gastroesophageal reflux disease)   . Hypertension   . Hypothyroid   . Lumbar disc disease   . Obesity   . Osteoarthritis    Lumbar disc disease, knees.  . Osteoporosis, post-menopausal    Lumbar compression fracture status post kyphoplasty.  Alendronate.  . Shingles 11/10/2022  . Sleep apnea    Obstructive sleep apnea on CPAP at 9 cm of water.    Past Surgical History She has a past surgical history that includes Hysterectomy; Cholecystectomy; colonoscopy (07/05/2005); colonoscopy (07/17/2010); L2 compression fracture (02/01/2014); Colonoscopy (09/21/2015); Left TKA using all-cemented biomet Vangaurd system with 67.5mm  PCR femur a 71mm tibial tray with a 10 mm E-Poly insert and a 34 x 8.5 mm all-poly 3 pegged domed patella  (Left, 03/21/2016); REVERSE RIGHT TOTAL SHOULDER ARTHROPLASTY (Right, 08/25/2018); and Colonoscopy (04/24/2020).   Medications and Allergies  Current Medications  Current Outpatient Medications on File Prior to Visit  Medication Sig Dispense Refill  . aspirin  81 MG EC tablet Take 81 mg by mouth once daily    . azelastine (ASTELIN) 137 mcg nasal spray Place 1 spray into both nostrils 2 (two) times daily 10 mL 1  . azelastine (OPTIVAR) 0.05 % ophthalmic solution as needed    . cholecalciferol  (VITAMIN D3) 1000 unit tablet Take 50 mcg by mouth once daily    . cyanocobalamin (VITAMIN B12) 1000 MCG tablet Take 1,000 mcg by mouth once daily    . cyclobenzaprine (FLEXERIL) 10 MG tablet Take 1 tablet (10 mg total) by mouth 2 (two) times daily as needed for Muscle spasms 30 tablet 0  . diclofenac (VOLTAREN) 1 % topical gel Apply 2 g topically 4 (four) times daily    . fluticasone  propionate (FLONASE ) 50 mcg/actuation nasal spray SHAKE LIQUID AND USE 2 SPRAYS IN EACH NOSTRIL EVERY DAY 16 g 2  . gabapentin  (NEURONTIN ) 300 MG capsule TAKE 1 CAPSULE BY MOUTH IN THE MORNING AND 2 CAPSULES AT BEDTIME 90 capsule 2  . hydrALAZINE (APRESOLINE) 25 MG tablet TAKE 1 TABLET BY MOUTH TWICE DAILY FOR ELEVATED BLOOD PRESSURE SYMPTOMS 60 tablet 0  . HYDROcodone -acetaminophen  (NORCO) 5-325 mg tablet Take 1 tablet by mouth  3 (three) times daily Take 1 tablet twice daily and an extra one in the afternoon if needed for pain 90 tablet 0  . levothyroxine  (SYNTHROID ) 88 MCG tablet TAKE 1 TABLET BY MOUTH EVERY MORNING 30 TO 60 MINUTES BEFORE BREAKFAST(6:30 AM) ON AN EMPTY STOMACH AND WITH GLASS OF WATER 90 tablet 1  . MAGNESIUM  ORAL Take 500 mg by mouth Two tablets daily    . mometasone (NASONEX) 50 mcg/actuation nasal spray Place 2 sprays into both nostrils once daily    . omeprazole (PRILOSEC) 20 MG DR capsule TAKE 1  CAPSULE BY MOUTH EVERY DAY ON AN EMPTY STOMACH FIRST THING IN THE MORNING 90 capsule 1  . polyethylene glycol (MIRALAX) packet Take 17 g by mouth at bedtime as needed    . pramipexole (MIRAPEX) 1 MG tablet Take 1 tablet (1 mg total) by mouth at bedtime as needed 90 tablet 1  . sennosides-docusate (SENOKOT-S) 8.6-50 mg tablet Take 1 tablet by mouth 2 (two) times daily as needed for Constipation     No current facility-administered medications on file prior to visit.    Allergies: Other, Pravastatin, Codeine, Etodolac, Hydrochlorothiazide, Oxycodone , Celebrex [celecoxib], Relafen [nabumetone], Tramadol , and Zanaflex [tizanidine]  Social and Family History  Social History  reports that she has quit smoking. Her smoking use included cigarettes. She has been exposed to tobacco smoke. She has never used smokeless tobacco. She reports that she does not drink alcohol and does not use drugs.  Family History Family History  Problem Relation Name Age of Onset  . Arthritis Mother    . High blood pressure (Hypertension) Mother    . Diverticulitis Mother    . Diabetes type II Father    . Arthritis Sister    . Colon polyps Sister    . Rheum arthritis Cousin    . Colon cancer Neg Hx      Review of Systems   Review of Systems  Eyes:  Positive for blurred vision (cloudy vision intermittently).  Respiratory:  Negative for shortness of breath.   Cardiovascular:  Negative for chest pain, palpitations and leg swelling.  Gastrointestinal:  Negative for heartburn.  Neurological:  Positive for dizziness. Negative for loss of consciousness and headaches.       Denies presyncope, syncope, or falls  Endo/Heme/Allergies:  Does not bruise/bleed easily.      Physical Examination   Vitals:BP 134/80   Pulse 99   Ht 170.2 cm (5' 7)   Wt 86.6 kg (191 lb)   SpO2 98%   BMI 29.91 kg/m  Ht:170.2 cm (5' 7) Wt:86.6 kg (191 lb) ADJ:Anib surface area is 2.02 meters squared. Body mass index is 29.91  kg/m.  Physical Exam Vitals reviewed.  Constitutional:      General: She is not in acute distress.    Appearance: Normal appearance.  Neck:     Vascular: No carotid bruit.  Cardiovascular:     Rate and Rhythm: Normal rate and regular rhythm.     Pulses: Normal pulses.     Heart sounds: Normal heart sounds. No murmur heard. Pulmonary:     Effort: Pulmonary effort is normal. No respiratory distress.     Breath sounds: Normal breath sounds.  Musculoskeletal:     Right lower leg: No edema.     Left lower leg: No edema.  Neurological:     Mental Status: She is alert.       Data & Results   Recent Labs    09/26/21 [phone] 09/06/22  1218 10/22/23 0949  CHOLTOTAL 198 208* 160  HDL 58.1 70.4 57.5  LDLCALC 121 124 89  VLDL 19 14 14   TRIG 95 69 68    Recent Labs    09/06/22 1218 10/17/22 1433 06/30/23 1422 10/22/23 0949  NA 139 142 142 142  K 4.4 4.2 4.6 4.3  BUN 20 15 17 17   CREATININE 0.7 0.9 0.7 0.7  CO2 30.3 27.3 30.8 30.5  GLUCOSE 89 107 92 89  ALT 291* 13  --  27  AST 65* 16  --  23  TBILI 0.5 0.5  --  0.5  ALB 4.2 4.1  --  4.1    Recent Labs    09/06/22 1218 06/30/23 1422 10/22/23 0949  WBC 6.9 6.9 6.2  HGB 12.9 12.6 12.6  HCT 39.6 37.4 39.1  MCV 101.5* 99.5 101.6*  PLT 197 184 204    Recent Labs    09/26/21 0853 09/06/22 1218 10/22/23 0949  TSH 2.162 0.739 2.454  HGBA1C 6.1* 5.8* 6.1*     Carotid ultrasound2/2023- mild plaque in left carotid artery.   Echo 02/2020- INTERPRETATION NORMAL LEFT VENTRICULAR SYSTOLIC FUNCTION NORMAL RIGHT VENTRICULAR SYSTOLIC FUNCTION TRIVIAL REGURGITATION NOTED (See above) NO VALVULAR STENOSIS   Assessment   83 y.o. female with  Encounter Diagnoses  Name Primary?  . Essential hypertension Yes  . PVC (premature ventricular contraction)   . PAC (premature atrial contraction)   . Mixed hyperlipidemia   . Carotid atherosclerosis, left   . Vision changes   . OSA (obstructive sleep apnea)     Plan    Orders Placed This Encounter  Procedures  . CARD US  carotid bilateral  . ECG 12-lead   - BP at goal. No changes - continue current regimen. Refills for 1 yr provided. - Continue aspirin  and zetia for carotid artery atherosclerosis management. Last US  was 07/18/21 and showed mild atherosclerosis with <50% stenosis. With some ill-defined dizziness and intermittent cloudy vision, will repeat US .  - Asymptomatic PACs and PVCs. 1 PVC by EKG. Continue diltiazem.  - Continue CPAP. - Review of chart shows visit for chest pain with PCP 08/2023. ECHO stress test ordered. Pt does not remember this. She cancelled stress test twice. She denies chest pain or dyspnea.   Return in about 1 year (around 04/22/2025).   Attestation Statement:   I personally performed the service, non-incident to. [facility])   MARY TINNIE LAUNIE MAIDEN, NP

## 2024-04-22 NOTE — Telephone Encounter (Signed)
 Called patient about missed PT appointment today. Pt was unaware of appointment. PT and patient reviewed other scheduled appointments going forward to ensure no discrepancies. Patient has correct dates and times going forward.

## 2024-04-23 ENCOUNTER — Ambulatory Visit (INDEPENDENT_AMBULATORY_CARE_PROVIDER_SITE_OTHER): Admitting: Neurosurgery

## 2024-04-23 ENCOUNTER — Encounter: Payer: Self-pay | Admitting: Neurosurgery

## 2024-04-23 ENCOUNTER — Other Ambulatory Visit: Payer: Self-pay

## 2024-04-23 ENCOUNTER — Ambulatory Visit: Payer: Self-pay | Admitting: Neurosurgery

## 2024-04-23 VITALS — BP 130/64 | Ht 67.0 in | Wt 192.4 lb

## 2024-04-23 DIAGNOSIS — M5416 Radiculopathy, lumbar region: Secondary | ICD-10-CM

## 2024-04-23 DIAGNOSIS — R29898 Other symptoms and signs involving the musculoskeletal system: Secondary | ICD-10-CM

## 2024-04-23 DIAGNOSIS — G8929 Other chronic pain: Secondary | ICD-10-CM

## 2024-04-23 DIAGNOSIS — M48061 Spinal stenosis, lumbar region without neurogenic claudication: Secondary | ICD-10-CM

## 2024-04-23 DIAGNOSIS — M48062 Spinal stenosis, lumbar region with neurogenic claudication: Secondary | ICD-10-CM

## 2024-04-23 DIAGNOSIS — Z01818 Encounter for other preprocedural examination: Secondary | ICD-10-CM

## 2024-04-23 NOTE — Progress Notes (Signed)
 Referring Physician:  Sherial Bail, MD [address] [place],  [place] [address]  Primary Physician:  Sherial Bail, MD  History of Present Illness: 04/23/2024 Discussed the use of AI scribe software for clinical note transcription with the patient, who gave verbal consent to proceed.  [name] is here today with a chief complaint of left lower extremity radiculopathy.  She has had a chronic history of this for at least the past 9 months.  She has had injections since April and has been working with physical therapy but continues to have worsened left lower extremity pain.  This inhibits her ability to walk, move about her house, and she feels like her weakness is steadily progressing.  She is not having any bowel or bladder difficulties.     Conservative measures:  Physical therapy: Has not participated in PT. Multimodal medical therapy including regular antiinflammatories: Flexeril, Gabapentin , Hyrocodone, Biofreeze, Aspercreme Injections: 03/12/2024 Left S1 ESI 12/22/2023 L5-S1 Left ESI 01/21/2024 L5-S1 left ESI 09/29/2023 L5-S1 left ESI  Past Surgery: 02/01/2014 L2 Kyphoplasty  The symptoms are causing a significant impact on the patient's life.   I have utilized the care everywhere function in epic to review the outside records available from external health systems.  Review of Systems:  A 10 point review of systems is negative, except for the pertinent positives and negatives detailed in the HPI.  Past Medical History: Past Medical History:  Diagnosis Date   Arthritis    Complication of anesthesia    dizziness   Hypertension    Hypothyroidism    Sleep apnea    cpap   Thyroid disease     Past Surgical History: Past Surgical History:  Procedure Laterality Date   ABDOMINAL HYSTERECTOMY     BACK SURGERY     lower middle kyphoplasty   COLONOSCOPY WITH PROPOFOL  N/A 09/21/2015   Procedure: COLONOSCOPY WITH PROPOFOL ;  Surgeon: Deward CINDERELLA Piedmont, MD;   Location: [facility];  Service: Gastroenterology;  Laterality: N/A;   COLONOSCOPY WITH PROPOFOL  N/A 04/24/2020   Procedure: COLONOSCOPY WITH PROPOFOL ;  Surgeon: Maryruth Ole DASEN, MD;  Location: [facility];  Service: Endoscopy;  Laterality: N/A;   JOINT REPLACEMENT     03/19/2016- left knee replacement   REVERSE SHOULDER ARTHROPLASTY Right 08/25/2018   Procedure: REVERSE SHOULDER ARTHROPLASTY, RIGHT;  Surgeon: Edie Norleen PARAS, MD;  Location: [facility];  Service: Orthopedics;  Laterality: Right;   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   Procedure: TOTAL KNEE ARTHROPLASTY;  Surgeon: Norleen PARAS Edie, MD;  Location: [facility] OR[facility];  Service: Orthopedics;  Laterality: Left;    Allergies: Allergies as of 04/23/2024 - Review Complete 04/23/2024  Allergen Reaction Noted   Other Other (See Comments) 08/09/2014   Zanaflex [tizanidine]  04/21/2020   Celebrex [celecoxib] Rash 09/20/2015   Codeine Nausea And Vomiting 09/20/2015   Etodolac Nausea And Vomiting 09/20/2015   Hydrochlorothiazide  09/20/2015   Oxycodone  Nausea And Vomiting 09/20/2015   Pravastatin  09/20/2015   Relafen [nabumetone]  09/20/2015   Tramadol  Nausea And Vomiting 09/20/2015    Medications:  Current Outpatient Medications:    amLODipine  (NORVASC ) 5 MG tablet, Take 5 mg by mouth daily., Disp: , Rfl:    ASPERCREME LIDOCAINE  EX, Apply 1 application  topically daily as needed (pain)., Disp: , Rfl:    cholecalciferol  (VITAMIN D3) 25 MCG (1000 UT) tablet, Take 2,000 Units by mouth daily., Disp: , Rfl:    cyanocobalamin 1000 MCG tablet, Take by mouth., Disp: , Rfl:  cyclobenzaprine (FLEXERIL) 10 MG tablet, Take 1 tablet by mouth 3 (three) times daily as needed., Disp: , Rfl:    diltiazem (CARDIZEM CD) 120 MG 24 hr capsule, Take 120 mg by mouth daily., Disp: , Rfl:    ezetimibe (ZETIA) 10 MG tablet, Take 10 mg by mouth., Disp: , Rfl:    fluticasone  (FLONASE ) 50 MCG/ACT nasal spray, SHAKE LIQUID AND USE 2 SPRAYS IN EACH NOSTRIL EVERY  DAY, Disp: , Rfl:    gabapentin  (NEURONTIN ) 300 MG capsule, 300 mg qday, 300 mg qPM, 600 mg qhs (Patient taking differently: 300 mg. 300 mg qday,  600 mg qhs), Disp: 60 capsule, Rfl: 2   Homeopathic Products (THERAWORX RELIEF EX), Apply 1 application  topically daily as needed (pain)., Disp: , Rfl:    HYDROcodone -acetaminophen  (NORCO) 7.5-325 MG tablet, Take 1-2 tablets by mouth every 12 (twelve) hours as needed for moderate pain (pain score 4-6)., Disp: 60 tablet, Rfl: 0   levothyroxine  (SYNTHROID , LEVOTHROID) 88 MCG tablet, Take 88 mcg by mouth daily before breakfast. , Disp: , Rfl:    Magnesium  250 MG TABS, Take 250 mg by mouth 2 (two) times daily., Disp: , Rfl:    Menthol, Topical Analgesic, (BIOFREEZE EX), Apply 1 application topically daily as needed (pain)., Disp: , Rfl:    Menthol, Topical Analgesic, (TWO [place]LD [place]ATS ARTHRITIS EX), Apply 1 application  topically daily as needed (pain)., Disp: , Rfl:    mometasone (NASONEX) 50 MCG/ACT nasal spray, Place 2 sprays into the nose daily as needed (allergies). , Disp: , Rfl:    Naphazoline-Pheniramine (OPCON-A  OP), Place 1 drop into both eyes daily as needed (allergies)., Disp: , Rfl:    olmesartan (BENICAR) 40 MG tablet, Take 40 mg by mouth daily., Disp: , Rfl:    omeprazole (PRILOSEC) 20 MG capsule, Take 20 mg by mouth daily., Disp: , Rfl:    pramipexole (MIRAPEX) 0.5 MG tablet, Take 0.5 mg by mouth at bedtime as needed., Disp: , Rfl:   Social History: Social History   Tobacco Use   Smoking status: Former   Smokeless tobacco: Never   Tobacco comments:    quit 30 years ago  Vaping Use   Vaping status: Never Used  Substance Use Topics   Alcohol use: Yes    Comment: very little   Drug use: No    Family Medical History: Family History  Problem Relation Age of Onset   Breast cancer Neg Hx     Physical Examination: Vitals:   04/23/24 1217  BP: 130/64   NEUROLOGICAL:     Awake, alert, oriented to person, place, and time.  Speech  is clear and fluent.   Cranial Nerves: Pupils equal round and reactive to light.  Facial tone is symmetric.  Facial sensation is symmetric. Shoulder shrug is symmetric. Tongue protrusion is midline.    Strength:  Side Iliopsoas Quads Hamstring PF DF EHL  R 5 5 5 5 5 5   L 5 5 4+ 4+ 4 3   Reflexes are 2+ and symmetric at the biceps, triceps, brachioradialis, patella and achilles.   Hoffman's is absent. Clonus is absent Decreased left medial hamstring reflex  Bilateral upper and lower extremity sensation is intact to light touch, loss of sensation left L5  Cane and walker assisted.   Imaging: Narrative & Impression  CLINICAL DATA:  Left lower extremity radiculopathy.   EXAM: MRI LUMBAR SPINE WITHOUT CONTRAST   TECHNIQUE: Multiplanar, multisequence MR imaging of the lumbar spine was performed. No intravenous contrast  was administered.   COMPARISON:  MRI of the lumbar spine dated March 18, 2017.   FINDINGS: Segmentation:  5 non rib-bearing lumbar type vertebrae.   Alignment: Grade 1 anterolisthesis at L4-5 and slight retrolisthesis at L2-3 and L3-4.   Vertebrae: Chronic moderate to severe compression deformity of L2 status post augmentation. There is persistent retropulsion of the posterior wall causing moderate central spinal canal stenosis, as before. Mild interval worsening of degenerative anterolisthesis at L4-5. No osseous lesions.   Conus medullaris and cauda equina: Conus extends to the L1-2 level. Conus and cauda equina appear normal.   Paraspinal and other soft tissues: There are bilateral parapelvic renal cysts present. No follow-up is necessary. The paraspinous soft tissues are otherwise unremarkable.   Disc levels:   T12-L1: Normal.   L1-2: Mild disc bulging and retropulsion of the posterosuperior corner of L2, resulting in moderate central spinal canal stenosis. The neural foramina are unremarkable. There is no nerve root impingement.   L2-3:  Broad-based disc bulging and bilateral facet hypertrophy, causing moderate central spinal canal stenosis and moderate bilateral lateral recess stenosis. No definite nerve root impingement.   L3-4: Broad-based disc bulging and bilateral facet hypertrophy, with moderate central spinal canal stenosis and mild-to-moderate bilateral lateral recess stenosis. No apparent nerve root impingement.   L4-5: Grade 1 anterolisthesis with marked bilateral facet arthrosis, causing severe central spinal canal stenosis and bilateral lateral recess stenosis, with impingement of the L5 nerves in the lateral recesses bilaterally. There is also moderate bilateral neural foraminal stenosis.   L5-S1: Mild disc bulging and bilateral facet hypertrophy, with mild central spinal canal stenosis and mild bilateral neural foraminal stenosis.   IMPRESSION: 1. Interval worsening of degenerative anterolisthesis at L4-5, now with severe central spinal canal stenosis, bilateral lateral recess stenosis and impingement of the L5 nerves bilaterally. 2. Chronic moderate to severe compression deformity of L2 status post augmentation. Persistent moderate central spinal canal stenosis.     Electronically Signed   By: Evalene Coho M.D.   On: 01/14/2024 09:57    Narrative & Impression  EXAM: 4 VIEW(S) XRAY OF THE LUMBAR SPINE 03/29/2024 03:39:32 PM   COMPARISON: Lumbar spine MRI 01/06/2024.   CLINICAL HISTORY: History of an L2 compression fracture with augmentation. Evaluate for mobile spondylolisthesis.   FINDINGS:   LUMBAR SPINE: There are 5 lumbar vertebrae.   BONES: Osteopenia. No acute fracture. No aggressive appearing osseous lesion.   Alignment: Largely due to posterior superior cortical retropulsion related to the prior L2 compression fracture, there is a grade 1 anterolisthesis of L1 on L2, which is stable in all 3 positions.   The alignment remains stable in all three positions at the  ensuing consecutive 3 levels, with grade 1 retrolisthesis measuring 5 mm at L2-L3 and L3-L4 and 1 cm grade 1 anterolisthesis at L4-L5. There is no listhesis at T12-L1 and L5-S1. Moderate chronic wedge compression fracture deformity of L2 with augmentation is again noted, unchanged.   There are bulky anterior bridging osteophytes to L1. The rest of the lumbar spine demonstrates prominent nonbridging osteophytes with multilevel bridging osteophytes in the visualized lower thoracic spine.   The SI joints are unremarkable, as visualized.   DISCS AND DEGENERATIVE CHANGES: There is moderate disc space loss from L3-L4 down with facet hypertrophy, mild disc space loss at L1-L2 and L2-L3.   SOFT TISSUES: There are scattered left paraspinal calcifications most likely indicating gonadal vein phleboliths and multiple phleboliths in the pelvis. There are cholecystectomy clips.   IMPRESSION:  1. Stable grade 1 anterolisthesis of L1 on L2 related to prior L2 compression fracture, unchanged across flexion, extension, and neutral. 2. 5 mm grade 1 retrolisthesis at L2-3 and L3-4, and 1 cm grade 1 anterolisthesis at L4-5, unchanged across flexion, extension, and neutral. 3. Moderate chronic wedge compression fracture deformity of L2 with augmentation, unchanged. 4. Osteopenia and additional degenerative findings discussed above.   Electronically signed by: Francis Quam MD 04/01/2024 04:28 AM EDT RP Workstation: [id number]    I have personally reviewed the images and agree with the above interpretation.  I am awaiting final reads on her x-rays, does not appear to have a significant amount of pathologic motion noted.  Medical Decision Making/Assessment and Plan: Ms. Pangelinan is a pleasant 83 y.o. female with severe left-sided lumbar radiculopathy.  She has severe pain going from her buttocks down the back of her leg to the side of her foot mostly in an L5 type distribution.  Has positive straight leg  raise.  Decreased medial hamstrings reflex.  She has weakness as well in her L5 myotome with incomplete dorsiflexion and incomplete EHL function graded above.  She has a loss of sensation on the top of her foot.  She has minimal back pain.  She does have a worsening spondylolisthesis noted on her MRI from subsequent imaging and some T2 signal in her joint capsules.  I would like to get flexion-extension x-rays of her lumbar spine.  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 Conservative management insufficient. Surgical intervention considered due to persistent pain progressive weakness and functional limitations. Proposed left-sided approach to minimally invasive laminectomy and lateral recess decompression and lateral recess decompression to alleviate nerve compression. Risks include potential instability and future need for fusion, but expected to provide significant relief of lower extremity pain.  Notably we did have a discussion about her multilevel spondylosis and spondylolisthesis, that did not appear to be unstable on flexion-extension films since none of these were worsening grade 1 or showed any instability we plan for decompression alone.  We discussed that this would be a same-day discharge unless she had a complication such as spinal fluid leak.  She would like to go forward with the procedure.  She understands the risks and benefits.  We discussed the alternatives and given her progressive deficits and intractable pain she would like to go forward with surgery.   Thank you for involving me in the care of this patient.    Penne MICAEL Sharps MD/MSCR Neurosurgery

## 2024-04-23 NOTE — H&P (View-Only) (Signed)
 Referring Physician:  Sherial Bail, MD [address] [place],  [place] [address]  Primary Physician:  Sherial Bail, MD  History of Present Illness: 04/23/2024 Discussed the use of AI scribe software for clinical note transcription with the patient, who gave verbal consent to proceed.  [name] is here today with a chief complaint of left lower extremity radiculopathy.  She has had a chronic history of this for at least the past 9 months.  She has had injections since April and has been working with physical therapy but continues to have worsened left lower extremity pain.  This inhibits her ability to walk, move about her house, and she feels like her weakness is steadily progressing.  She is not having any bowel or bladder difficulties.     Conservative measures:  Physical therapy: Has not participated in PT. Multimodal medical therapy including regular antiinflammatories: Flexeril, Gabapentin , Hyrocodone, Biofreeze, Aspercreme Injections: 03/12/2024 Left S1 ESI 12/22/2023 L5-S1 Left ESI 01/21/2024 L5-S1 left ESI 09/29/2023 L5-S1 left ESI  Past Surgery: 02/01/2014 L2 Kyphoplasty  The symptoms are causing a significant impact on the patient's life.   I have utilized the care everywhere function in epic to review the outside records available from external health systems.  Review of Systems:  A 10 point review of systems is negative, except for the pertinent positives and negatives detailed in the HPI.  Past Medical History: Past Medical History:  Diagnosis Date   Arthritis    Complication of anesthesia    dizziness   Hypertension    Hypothyroidism    Sleep apnea    cpap   Thyroid disease     Past Surgical History: Past Surgical History:  Procedure Laterality Date   ABDOMINAL HYSTERECTOMY     BACK SURGERY     lower middle kyphoplasty   COLONOSCOPY WITH PROPOFOL  N/A 09/21/2015   Procedure: COLONOSCOPY WITH PROPOFOL ;  Surgeon: Deward CINDERELLA Piedmont, MD;   Location: [facility];  Service: Gastroenterology;  Laterality: N/A;   COLONOSCOPY WITH PROPOFOL  N/A 04/24/2020   Procedure: COLONOSCOPY WITH PROPOFOL ;  Surgeon: Maryruth Ole DASEN, MD;  Location: [facility];  Service: Endoscopy;  Laterality: N/A;   JOINT REPLACEMENT     03/19/2016- left knee replacement   REVERSE SHOULDER ARTHROPLASTY Right 08/25/2018   Procedure: REVERSE SHOULDER ARTHROPLASTY, RIGHT;  Surgeon: Edie Norleen PARAS, MD;  Location: [facility];  Service: Orthopedics;  Laterality: Right;   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   Procedure: TOTAL KNEE ARTHROPLASTY;  Surgeon: Norleen PARAS Edie, MD;  Location: [facility] OR[facility];  Service: Orthopedics;  Laterality: Left;    Allergies: Allergies as of 04/23/2024 - Review Complete 04/23/2024  Allergen Reaction Noted   Other Other (See Comments) 08/09/2014   Zanaflex [tizanidine]  04/21/2020   Celebrex [celecoxib] Rash 09/20/2015   Codeine Nausea And Vomiting 09/20/2015   Etodolac Nausea And Vomiting 09/20/2015   Hydrochlorothiazide  09/20/2015   Oxycodone  Nausea And Vomiting 09/20/2015   Pravastatin  09/20/2015   Relafen [nabumetone]  09/20/2015   Tramadol  Nausea And Vomiting 09/20/2015    Medications:  Current Outpatient Medications:    amLODipine  (NORVASC ) 5 MG tablet, Take 5 mg by mouth daily., Disp: , Rfl:    ASPERCREME LIDOCAINE  EX, Apply 1 application  topically daily as needed (pain)., Disp: , Rfl:    cholecalciferol  (VITAMIN D3) 25 MCG (1000 UT) tablet, Take 2,000 Units by mouth daily., Disp: , Rfl:    cyanocobalamin 1000 MCG tablet, Take by mouth., Disp: , Rfl:  cyclobenzaprine (FLEXERIL) 10 MG tablet, Take 1 tablet by mouth 3 (three) times daily as needed., Disp: , Rfl:    diltiazem (CARDIZEM CD) 120 MG 24 hr capsule, Take 120 mg by mouth daily., Disp: , Rfl:    ezetimibe (ZETIA) 10 MG tablet, Take 10 mg by mouth., Disp: , Rfl:    fluticasone  (FLONASE ) 50 MCG/ACT nasal spray, SHAKE LIQUID AND USE 2 SPRAYS IN EACH NOSTRIL EVERY  DAY, Disp: , Rfl:    gabapentin  (NEURONTIN ) 300 MG capsule, 300 mg qday, 300 mg qPM, 600 mg qhs (Patient taking differently: 300 mg. 300 mg qday,  600 mg qhs), Disp: 60 capsule, Rfl: 2   Homeopathic Products (THERAWORX RELIEF EX), Apply 1 application  topically daily as needed (pain)., Disp: , Rfl:    HYDROcodone -acetaminophen  (NORCO) 7.5-325 MG tablet, Take 1-2 tablets by mouth every 12 (twelve) hours as needed for moderate pain (pain score 4-6)., Disp: 60 tablet, Rfl: 0   levothyroxine  (SYNTHROID , LEVOTHROID) 88 MCG tablet, Take 88 mcg by mouth daily before breakfast. , Disp: , Rfl:    Magnesium  250 MG TABS, Take 250 mg by mouth 2 (two) times daily., Disp: , Rfl:    Menthol, Topical Analgesic, (BIOFREEZE EX), Apply 1 application topically daily as needed (pain)., Disp: , Rfl:    Menthol, Topical Analgesic, (TWO [place]LD GOATS ARTHRITIS EX), Apply 1 application  topically daily as needed (pain)., Disp: , Rfl:    mometasone (NASONEX) 50 MCG/ACT nasal spray, Place 2 sprays into the nose daily as needed (allergies). , Disp: , Rfl:    Naphazoline-Pheniramine (OPCON-A  OP), Place 1 drop into both eyes daily as needed (allergies)., Disp: , Rfl:    olmesartan (BENICAR) 40 MG tablet, Take 40 mg by mouth daily., Disp: , Rfl:    omeprazole (PRILOSEC) 20 MG capsule, Take 20 mg by mouth daily., Disp: , Rfl:    pramipexole (MIRAPEX) 0.5 MG tablet, Take 0.5 mg by mouth at bedtime as needed., Disp: , Rfl:   Social History: Social History   Tobacco Use   Smoking status: Former   Smokeless tobacco: Never   Tobacco comments:    quit 30 years ago  Vaping Use   Vaping status: Never Used  Substance Use Topics   Alcohol use: Yes    Comment: very little   Drug use: No    Family Medical History: Family History  Problem Relation Age of Onset   Breast cancer Neg Hx     Physical Examination: Vitals:   04/23/24 1217  BP: 130/64   NEUROLOGICAL:     Awake, alert, oriented to person, place, and time.  Speech  is clear and fluent.   Cranial Nerves: Pupils equal round and reactive to light.  Facial tone is symmetric.  Facial sensation is symmetric. Shoulder shrug is symmetric. Tongue protrusion is midline.    Strength:  Side Iliopsoas Quads Hamstring PF DF EHL  R 5 5 5 5 5 5   L 5 5 4+ 4+ 4 3   Reflexes are 2+ and symmetric at the biceps, triceps, brachioradialis, patella and achilles.   Hoffman's is absent. Clonus is absent Decreased left medial hamstring reflex  Bilateral upper and lower extremity sensation is intact to light touch, loss of sensation left L5  Cane and walker assisted.   Imaging: Narrative & Impression  CLINICAL DATA:  Left lower extremity radiculopathy.   EXAM: MRI LUMBAR SPINE WITHOUT CONTRAST   TECHNIQUE: Multiplanar, multisequence MR imaging of the lumbar spine was performed. No intravenous contrast  was administered.   COMPARISON:  MRI of the lumbar spine dated March 18, 2017.   FINDINGS: Segmentation:  5 non rib-bearing lumbar type vertebrae.   Alignment: Grade 1 anterolisthesis at L4-5 and slight retrolisthesis at L2-3 and L3-4.   Vertebrae: Chronic moderate to severe compression deformity of L2 status post augmentation. There is persistent retropulsion of the posterior wall causing moderate central spinal canal stenosis, as before. Mild interval worsening of degenerative anterolisthesis at L4-5. No osseous lesions.   Conus medullaris and cauda equina: Conus extends to the L1-2 level. Conus and cauda equina appear normal.   Paraspinal and other soft tissues: There are bilateral parapelvic renal cysts present. No follow-up is necessary. The paraspinous soft tissues are otherwise unremarkable.   Disc levels:   T12-L1: Normal.   L1-2: Mild disc bulging and retropulsion of the posterosuperior corner of L2, resulting in moderate central spinal canal stenosis. The neural foramina are unremarkable. There is no nerve root impingement.   L2-3:  Broad-based disc bulging and bilateral facet hypertrophy, causing moderate central spinal canal stenosis and moderate bilateral lateral recess stenosis. No definite nerve root impingement.   L3-4: Broad-based disc bulging and bilateral facet hypertrophy, with moderate central spinal canal stenosis and mild-to-moderate bilateral lateral recess stenosis. No apparent nerve root impingement.   L4-5: Grade 1 anterolisthesis with marked bilateral facet arthrosis, causing severe central spinal canal stenosis and bilateral lateral recess stenosis, with impingement of the L5 nerves in the lateral recesses bilaterally. There is also moderate bilateral neural foraminal stenosis.   L5-S1: Mild disc bulging and bilateral facet hypertrophy, with mild central spinal canal stenosis and mild bilateral neural foraminal stenosis.   IMPRESSION: 1. Interval worsening of degenerative anterolisthesis at L4-5, now with severe central spinal canal stenosis, bilateral lateral recess stenosis and impingement of the L5 nerves bilaterally. 2. Chronic moderate to severe compression deformity of L2 status post augmentation. Persistent moderate central spinal canal stenosis.     Electronically Signed   By: Evalene Coho M.D.   On: 01/14/2024 09:57    Narrative & Impression  EXAM: 4 VIEW(S) XRAY OF THE LUMBAR SPINE 03/29/2024 03:39:32 PM   COMPARISON: Lumbar spine MRI 01/06/2024.   CLINICAL HISTORY: History of an L2 compression fracture with augmentation. Evaluate for mobile spondylolisthesis.   FINDINGS:   LUMBAR SPINE: There are 5 lumbar vertebrae.   BONES: Osteopenia. No acute fracture. No aggressive appearing osseous lesion.   Alignment: Largely due to posterior superior cortical retropulsion related to the prior L2 compression fracture, there is a grade 1 anterolisthesis of L1 on L2, which is stable in all 3 positions.   The alignment remains stable in all three positions at the  ensuing consecutive 3 levels, with grade 1 retrolisthesis measuring 5 mm at L2-L3 and L3-L4 and 1 cm grade 1 anterolisthesis at L4-L5. There is no listhesis at T12-L1 and L5-S1. Moderate chronic wedge compression fracture deformity of L2 with augmentation is again noted, unchanged.   There are bulky anterior bridging osteophytes to L1. The rest of the lumbar spine demonstrates prominent nonbridging osteophytes with multilevel bridging osteophytes in the visualized lower thoracic spine.   The SI joints are unremarkable, as visualized.   DISCS AND DEGENERATIVE CHANGES: There is moderate disc space loss from L3-L4 down with facet hypertrophy, mild disc space loss at L1-L2 and L2-L3.   SOFT TISSUES: There are scattered left paraspinal calcifications most likely indicating gonadal vein phleboliths and multiple phleboliths in the pelvis. There are cholecystectomy clips.   IMPRESSION:  1. Stable grade 1 anterolisthesis of L1 on L2 related to prior L2 compression fracture, unchanged across flexion, extension, and neutral. 2. 5 mm grade 1 retrolisthesis at L2-3 and L3-4, and 1 cm grade 1 anterolisthesis at L4-5, unchanged across flexion, extension, and neutral. 3. Moderate chronic wedge compression fracture deformity of L2 with augmentation, unchanged. 4. Osteopenia and additional degenerative findings discussed above.   Electronically signed by: Francis Quam MD 04/01/2024 04:28 AM EDT RP Workstation: [id number]    I have personally reviewed the images and agree with the above interpretation.  I am awaiting final reads on her x-rays, does not appear to have a significant amount of pathologic motion noted.  Medical Decision Making/Assessment and Plan: Ms. Pangelinan is a pleasant 83 y.o. female with severe left-sided lumbar radiculopathy.  She has severe pain going from her buttocks down the back of her leg to the side of her foot mostly in an L5 type distribution.  Has positive straight leg  raise.  Decreased medial hamstrings reflex.  She has weakness as well in her L5 myotome with incomplete dorsiflexion and incomplete EHL function graded above.  She has a loss of sensation on the top of her foot.  She has minimal back pain.  She does have a worsening spondylolisthesis noted on her MRI from subsequent imaging and some T2 signal in her joint capsules.  I would like to get flexion-extension x-rays of her lumbar spine.  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 Conservative management insufficient. Surgical intervention considered due to persistent pain progressive weakness and functional limitations. Proposed left-sided approach to minimally invasive laminectomy and lateral recess decompression and lateral recess decompression to alleviate nerve compression. Risks include potential instability and future need for fusion, but expected to provide significant relief of lower extremity pain.  Notably we did have a discussion about her multilevel spondylosis and spondylolisthesis, that did not appear to be unstable on flexion-extension films since none of these were worsening grade 1 or showed any instability we plan for decompression alone.  We discussed that this would be a same-day discharge unless she had a complication such as spinal fluid leak.  She would like to go forward with the procedure.  She understands the risks and benefits.  We discussed the alternatives and given her progressive deficits and intractable pain she would like to go forward with surgery.   Thank you for involving me in the care of this patient.    Penne MICAEL Sharps MD/MSCR Neurosurgery

## 2024-04-23 NOTE — Patient Instructions (Addendum)
 Please see below for information in regards to your upcoming surgery:   Planned surgery: Left Sided Approach to L4-5 Minimally Invasive Laminectomy, medial facetectomy, and lateral recess decompression    Surgery date: 05/06/2024 at [facility] (Medical Mall: [address] [place], [place] [address]) - you will find out your arrival time the business day before your surgery.   Pre-op appointment at [facility] Pre-admit Testing: you will receive a call with a date/time for this appointment. If you are scheduled for an in person appointment, Pre-admit Testing is located on the first floor of the [facility] building, [address], [address]. During this appointment, they will advise you which medications you can take the morning of surgery, and which medications you will need to hold for surgery. Labs (such as blood work, EKG) may be done at your pre-op appointment. You are not required to fast for these labs. Should you need to change your pre-op appointment, please call Pre-admit testing at [phone].     Blood thinners:  Aspirin  81mg :  OK to stay on aspirin  81mg       Surgical clearance: we will send a clearance form to Lavenia Beaver, MD & [facility]. They may wish to see you in their office prior to signing the clearance form. If so, they may call you to schedule an appointment.       Common restrictions after spine surgery: No bending, lifting, or twisting ("BLT"). Avoid lifting objects heavier than 10 pounds for the first 6 weeks after surgery. Where possible, avoid household activities that involve lifting, bending, reaching, pushing, or pulling such as laundry, vacuuming, grocery shopping, and childcare. Try to arrange for help from friends and family for these activities while you heal. Do not drive while taking prescription pain medication. Weeks 6 through 12 after surgery: avoid lifting more than 25 pounds.         How to contact us :  If you have any questions/concerns before or after surgery, you can reach us  at [phone], or you can send a mychart message. We can be reached by phone or mychart 8am-4pm, Monday-Friday.  *Please note: Calls after 4pm are forwarded to a third party answering service. Mychart messages are not routinely monitored during evenings, weekends, and holidays. Please call our office to contact the answering service for urgent concerns during non-business hours.     If you have FMLA/disability paperwork, please drop it off or fax it to [phone]   Appointments/FMLA & disability paperwork: Reche Hait, & Nichole Registered Nurse/Surgery scheduler: Cypress Hinkson, RN & Katie, RN Certified Medical Assistants: Don, CMA, Elenor, CMA, Damien, CMA, & Auston, [place] Physician Assistants: Lyle Decamp, PA-C, Edsel Goods, PA-C & Glade Boys, PA-C Surgeons: Penne Sharps, MD & Reeves Daisy, MD     [facility] PREADMIT TESTING VISIT and SURGERY INFORMATION SHEET   Now that surgery has been scheduled you can anticipate several phone calls from [facility] services. A pharmacy technician will call you to verify your current list of medications taken at home.               The Pre-Service Center will call to verify your insurance information and to give you billing estimates and information.             The Preadmit Testing Office will be calling to schedule a visit to obtain information for the anesthesia team and provide instructions on preparation for surgery.  What can you expect for  the Preadmit Testing Visit: Appointments may be scheduled in-person or by telephone.  If a telephone visit is scheduled, you may be asked to come into the office to have lab tests or other studies performed.   This visit will not be completed any greater than 14 days prior to your surgery.  If your surgery has been scheduled for a future date, please do not be  alarmed if we have not contacted you to schedule an appointment more than a month prior to the surgery date.    Please be prepared to provide the following information during this appointment:            -Personal medical history                                               -Medication and allergy list            -Any history of problems with anesthesia              -Recent lab work or diagnostic studies            -Please notify us  of any needs we should be aware of to provide the best care possible           -You will be provided with instructions on how to prepare for your surgery.    On The Day of Surgery:  You must have a driver to take you home after surgery, you will be asked not to drive for 24 hours following surgery.  Taxi, Gisele and non-medical transport will not be acceptable means of transportation unless you have a responsible individual who will be traveling with you.  Visitors in the surgical area:   2 people will be able to visit you in your room once your preparation for surgery has been completed. During surgery, your visitors will be asked to wait in the Surgery Waiting Area.  It is not a requirement for them to stay, if they prefer to leave and come back.  Your visitor(s) will be given an update once the surgery has been completed.  No visitors are allowed in the initial recovery room to respect patient privacy and safety.  Once you are more awake and transfer to the secondary recovery area, or are transferred to an inpatient room, visitors will again be able to see you.  To respect and protect your privacy: We will ask on the day of surgery who your driver will be and what the contact number for that individual will be. We will ask if it is okay to share information with this individual, or if there is an alternative individual that we, or the surgeon, should contact to provide updates and information. If family or friends come to the surgical information desk requesting  information about you, who you have not listed with us , no information will be given.   It may be helpful to designate someone as the main contact who will be responsible for updating your other friends and family.    PREADMIT TESTING OFFICE: [phone] SAME DAY SURGERY: [phone] We look forward to caring for you before and throughout the process of your surgery.

## 2024-04-26 NOTE — Therapy (Incomplete)
 OUTPATIENT PHYSICAL THERAPY THORACOLUMBAR TREATMENT    Patient Name: Erika Crawford MRN: [id number] DOB:[birth year], 83 y.o., female Today's Date: 04/26/2024  END OF SESSION:      Past Medical History:  Diagnosis Date   Arthritis    Complication of anesthesia    dizziness   Hypertension    Hypothyroidism    Sleep apnea    cpap   Thyroid disease    Past Surgical History:  Procedure Laterality Date   ABDOMINAL HYSTERECTOMY     BACK SURGERY     lower middle kyphoplasty   COLONOSCOPY WITH PROPOFOL  N/A 09/21/2015   Procedure: COLONOSCOPY WITH PROPOFOL ;  Surgeon: Deward CINDERELLA Piedmont, MD;  Location: [facility] ENDOSCOPY;  Service: Gastroenterology;  Laterality: N/A;   COLONOSCOPY WITH PROPOFOL  N/A 04/24/2020   Procedure: COLONOSCOPY WITH PROPOFOL ;  Surgeon: Maryruth Ole DASEN, MD;  Location: [facility] ENDOSCOPY;  Service: Endoscopy;  Laterality: N/A;   JOINT REPLACEMENT     03/19/2016- left knee replacement   REVERSE SHOULDER ARTHROPLASTY Right 08/25/2018   Procedure: REVERSE SHOULDER ARTHROPLASTY, RIGHT;  Surgeon: Edie Norleen PARAS, MD;  Location: [facility] ORS;  Service: Orthopedics;  Laterality: Right;   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   Procedure: TOTAL KNEE ARTHROPLASTY;  Surgeon: Norleen PARAS Edie, MD;  Location: [facility] ORS;  Service: Orthopedics;  Laterality: Left;   Patient Active Problem List   Diagnosis Date Noted   Weakness of left lower extremity 04/23/2024   Lumbar foraminal stenosis 04/23/2023   Chronic pain syndrome 04/23/2023   Status post reverse total shoulder replacement, right 08/25/2018   Chronic left-sided low back pain with left-sided sciatica 06/22/2018   Vitamin B 12 deficiency 01/02/2018   Chronic anemia 09/06/2017   Chronic constipation 06/08/2017   Lumbar radiculopathy 05/22/2017   Spinal stenosis, lumbar region, with neurogenic claudication 03/19/2017   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 03/10/2017   Acute bilateral low back pain without sciatica 02/04/2017    Dysuria 12/13/2016   Prediabetes 11/05/2016   Acquired hypothyroidism 08/24/2016   Myofascial pain syndrome 08/24/2016   Essential hypertension 08/24/2016   Osteopenia of multiple sites 08/24/2016   Lumbar degenerative disc disease 08/24/2016   Bilateral hydronephrosis 07/15/2016   Rotator cuff tendinitis, left 06/21/2016   Status post total knee replacement using cement, 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   Rotator cuff tendinitis, right 11/27/2015   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   Injury of tendon of long head of right biceps 11/27/2015   Incomplete tear of right rotator cuff 11/27/2015   Obstructive sleep apnea on CPAP 07/13/2015   Osteoarthritis 11/04/2013   Disorder of uterus 04/29/1995    PCP: Dr. Lavenia Beaver    REFERRING PROVIDER: Dr.Bilal Lateef    REFERRING DIAG:  M54.16 (ICD-10-CM) - Lumbar radiculopathy M48.061 (ICD-10-CM) - Lumbar foraminal stenosis M48.062 (ICD-10-CM) - Spinal stenosis, lumbar region, with neurogenic claudication G89.4 (ICD-10-CM) - Chronic pain syndrome  Rationale for Evaluation and Treatment: Rehabilitation  THERAPY DIAG:  Other low back pain  Chronic left-sided lumbar radiculopathy  ONSET DATE: June  2025   SUBJECTIVE:  SUBJECTIVE STATEMENT:  *** Pt reports ongoing improvement in her pain since trying TENS unit. She is checking with insurance to whether they cover the TENS unit.   PERTINENT HISTORY:  Per Dr. Charolotte note on 02/10/24  Patient states that she is having increased lumbar radicular pain that is making it very challenging for her to walk. She is currently on hydrocodone  5 mg 3 times daily as needed as well as gabapentin  300 mg 3 times a day. I recommend a Medrol  Dosepak as below. She has an appointment already scheduled for September  11 at which point we will reevaluate. Future considerations could include bilateral L4 transforaminal ESI versus referral to neurosurgery for evaluation for surgical decompression at L4-L5.   PAIN:  Are you having pain? Yes: NPRS scale: 5-6/10   Pain location: Left Buttocks down posterior thigh into anterior ankle    Pain description: Throbbing    Aggravating factors: Bending forward to pick up something    Relieving factors: Pain medication helps    PRECAUTIONS: None  RED FLAGS: None   WEIGHT BEARING RESTRICTIONS: No  FALLS:  Has patient fallen in last 6 months? No  LIVING ENVIRONMENT: Lives with: lives alone Lives in: House/apartment Stairs: No Has following equipment at home: Single point cane and Environmental Consultant - 4 wheeled  OCCUPATION: Retired    PLOF: Independent  PATIENT GOALS: Pt wants to feel less pain so she can return to shopping and cooking without being limited by pain.    NEXT MD VISIT: Did not ask    OBJECTIVE:  Note: Objective measures were completed at Evaluation unless otherwise noted.  VITALS BP  137/66 HR 91 SpO2  100%  DIAGNOSTIC FINDINGS:  CLINICAL DATA:  Left lower extremity radiculopathy.   EXAM: MRI LUMBAR SPINE WITHOUT CONTRAST   TECHNIQUE: Multiplanar, multisequence MR imaging of the lumbar spine was performed. No intravenous contrast was administered.   COMPARISON:  MRI of the lumbar spine dated March 18, 2017.   FINDINGS: Segmentation:  5 non rib-bearing lumbar type vertebrae.   Alignment: Grade 1 anterolisthesis at L4-5 and slight retrolisthesis at L2-3 and L3-4.   Vertebrae: Chronic moderate to severe compression deformity of L2 status post augmentation. There is persistent retropulsion of the posterior wall causing moderate central spinal canal stenosis, as before. Mild interval worsening of degenerative anterolisthesis at L4-5. No osseous lesions.   Conus medullaris and cauda equina: Conus extends to the L1-2 level. Conus and  cauda equina appear normal.   Paraspinal and other soft tissues: There are bilateral parapelvic renal cysts present. No follow-up is necessary. The paraspinous soft tissues are otherwise unremarkable.   Disc levels:   T12-L1: Normal.   L1-2: Mild disc bulging and retropulsion of the posterosuperior corner of L2, resulting in moderate central spinal canal stenosis. The neural foramina are unremarkable. There is no nerve root impingement.   L2-3: Broad-based disc bulging and bilateral facet hypertrophy, causing moderate central spinal canal stenosis and moderate bilateral lateral recess stenosis. No definite nerve root impingement.   L3-4: Broad-based disc bulging and bilateral facet hypertrophy, with moderate central spinal canal stenosis and mild-to-moderate bilateral lateral recess stenosis. No apparent nerve root impingement.   L4-5: Grade 1 anterolisthesis with marked bilateral facet arthrosis, causing severe central spinal canal stenosis and bilateral lateral recess stenosis, with impingement of the L5 nerves in the lateral recesses bilaterally. There is also moderate bilateral neural foraminal stenosis.   L5-S1: Mild disc bulging and bilateral facet hypertrophy, with mild central spinal canal stenosis  and mild bilateral neural foraminal stenosis.   IMPRESSION: 1. Interval worsening of degenerative anterolisthesis at L4-5, now with severe central spinal canal stenosis, bilateral lateral recess stenosis and impingement of the L5 nerves bilaterally. 2. Chronic moderate to severe compression deformity of L2 status post augmentation. Persistent moderate central spinal canal stenosis.     Electronically Signed   By: Erika Crawford M.D.   On: 01/14/2024 09:57  PATIENT SURVEYS:  Modified Oswestry:  MODIFIED OSWESTRY DISABILITY SCALE  Date: 04/01/24 Score  Pain intensity 4 =  Pain medication provides me with little relief from pain.  2. Personal care (washing,  dressing, etc.) 3 =  I need help, but I am able to manage most of my personal care.  3. Lifting 5 =  I cannot lift or carry anything at all.  4. Walking 4 = I can only walk with crutches or a cane.  5. Sitting 2 =  Pain prevents me from sitting more than 1 hour.  6. Standing 2 =  Pain prevents me from standing more than 1 hour  7. Sleeping 2 =  Even when I take pain medication, I sleep less than 6 hours  8. Social Life 4 =  Pain has restricted my social life to my home  9. Traveling 5 = My pain prevents all travel except for visits to the physician/therapist or hospital  10. Employment/ Homemaking 4 = Pain prevents me from doing even light duties.  Total 35/50 (70%)   Interpretation of scores: Score Category Description  0-20% Minimal Disability The patient can cope with most living activities. Usually no treatment is indicated apart from advice on lifting, sitting and exercise  21-40% Moderate Disability The patient experiences more pain and difficulty with sitting, lifting and standing. Travel and social life are more difficult and they may be disabled from work. Personal care, sexual activity and sleeping are not grossly affected, and the patient can usually be managed by conservative means  41-60% Severe Disability Pain remains the main problem in this group, but activities of daily living are affected. These patients require a detailed investigation  61-80% Crippled Back pain impinges on all aspects of the patient's life. Positive intervention is required  81-100% Bed-bound These patients are either bed-bound or exaggerating their symptoms  Bluford FORBES Zoe DELENA Karon DELENA, et al. Surgery versus conservative management of stable thoracolumbar fracture: the PRESTO feasibility RCT. [place] ([place]): [employer]; 2021 Nov. [facility] Technology Assessment, No. 25.62.) Appendix 3, Oswestry Disability Index category descriptors. Available from:  Findjewelers.cz  Minimally Clinically Important Difference (MCID) = 12.8%  COGNITION: Overall cognitive status: Within functional limits for tasks assessed     SENSATION: WFL  MUSCLE LENGTH: Hamstrings: Right 90 deg; Left 70 deg* Thomas test: Not performed    POSTURE: rounded shoulders and forward head  PALPATION:   LUMBAR ROM:   AROM eval  Flexion 100%*  Extension 100%  Right lateral flexion 100%  Left lateral flexion 100%  Right rotation 100%  Left rotation 100%   (Blank rows = not tested)  LOWER EXTREMITY ROM:     Active  Right eval Left eval  Hip flexion    Hip extension    Hip abduction    Hip adduction    Hip internal rotation    Hip external rotation    Knee flexion    Knee extension    Ankle dorsiflexion    Ankle plantarflexion    Ankle inversion    Ankle eversion     (  Blank rows = not tested)  LOWER EXTREMITY MMT:    MMT Right eval Left eval  Hip flexion 4 4-*  Hip extension    Hip External Rotation in Sitting 4 4  Hip adduction 4 4  Hip internal rotation    Knee flexion 4 4  Knee extension 4 4  Ankle dorsiflexion 4 4  Ankle plantarflexion    Ankle inversion    Ankle eversion     (Blank rows = not tested)  LUMBAR SPECIAL TESTS:  Straight leg raise test: Positive  FUNCTIONAL TESTS:  5 times sit to stand: NT  30 seconds chair stand test Timed up and go (TUG): NT 2 minute walk test: 312 ft 10 meter walk test: NT  GAIT: Distance walked: 50 ft   Assistive device utilized: Erika Crawford - 4 wheeled Level of assistance: Modified independence Comments: Decreased step length bilaterally   TREATMENT DATE:  04/27/24 ***    04/15/24: THEREX    TUG: 13.70 sec with use of 2WW  : 312 feet 2WW performed in 10 meter distance format.    USE TENS with two channel mode and box formation with setting at  5   Hook Lying Lower Trunk Rotation 2 x 10   -NRPS 4/10  Hook Lying TA 3 sec hold 2 x 10    -NRPS 4/10    Hook Lying Marches 2 x 10   -NRPS 4/10   No TENS  with lower trunk rotation 2 x 10  -NRPS  4/10   TENS turned on    Sit to stand from 22 mat height UE support 1 x 10   Sit to stand from 24 mat height with UE support 1 x 10  -NRPS 8/10   Standing hip abduction on LLE with BUE support 1 x 10  -NRPS 5-6/10   -min VC to abduct left leg less Quarter Squat with BUE support  -mod VC to keep knees behind toes    -mat raised for external cueing   Standing HS curl on LLE with BUE support  1 x 10   -pt reports increased left hip pain  Standing HS curl on LLE with BUE support 1 x 10    -min VC to decrease ROM of knee flexion  1 x 10    -Pt report decrease in LLE pain   Standing heel raises with BUE support 2 x 10      PATIENT EDUCATION:  Education details: Form and technique for correct performance of exercise and explanation about deficits resulting from L4 nerve impingement  Person educated: Patient Education method: Explanation, Demonstration, Verbal cues, and Handouts Education comprehension: verbalized understanding, returned demonstration, and verbal cues required  HOME EXERCISE PROGRAM: Access Code: [email or web address] URL: [email or web address] Date: 04/15/2024 Prepared by: Toribio Servant  Exercises - Hooklying Single Knee to Chest Stretch  - 1 x daily - 7 x weekly - 2 sets - 10 reps - 3 sec  hold - Supine Lower Trunk Rotation  - 1 x daily - 7 x weekly - 2 sets - 10 reps - 30 sec  hold - Supine Transversus Abdominis Bracing - Hands on Stomach  - 1 x daily - 7 x weekly - 2 sets - 10 reps - 3 sec hold - Standing March with Unilateral Counter Support  - 5-7 x weekly - 2 sets - 10 reps - Quarter Squat with butt tap on bed but use hands on Erika Crawford for support  - 5-7 x weekly - 2 sets -  10 reps - Standing Heel Raise with Support  - 5-7 x weekly - 2 sets - 10 reps - Standing Knee Flexion AROM with Chair Support  - 5-7 x weekly - 2 sets - 10 reps ASSESSMENT:  CLINICAL  IMPRESSION: ***  Pt continues to show improvement with decrease in pain with use of TENS and even without use of TENS for certain exercises. She does continue to be limited by pain when performing sit to stands and when abducting hip even with use of TENS. PT increased strengthening exercises to see if patient could tolerate while performing at home with focus on reduced sets and reps with increased frequency, She will continue to benefit from skilled PT to reduce her left sided low back pain and improve her mobility, os that she is not limited by pain.   OBJECTIVE IMPAIRMENTS: Abnormal gait, decreased balance, decreased coordination, decreased knowledge of condition, decreased mobility, difficulty walking, decreased ROM, decreased strength, hypomobility, impaired flexibility, impaired UE functional use, postural dysfunction, obesity, and pain.   ACTIVITY LIMITATIONS: carrying, lifting, bending, standing, squatting, sleeping, stairs, transfers, bed mobility, bathing, toileting, dressing, hygiene/grooming, and locomotion level  PARTICIPATION LIMITATIONS: meal prep, cleaning, laundry, shopping, community activity, and church  PERSONAL FACTORS: Age, Fitness, and 3+ comorbidities: HTN, severe right knee OA, chronic pain syndrome are also affecting patient's functional outcome.   REHAB POTENTIAL: Good  CLINICAL DECISION MAKING: Stable/uncomplicated  EVALUATION COMPLEXITY: Low   GOALS: Goals reviewed with patient? No  SHORT TERM GOALS: Target date: 04/15/2024  Patient will demonstrate undestanding of home exercise plan by performing exercises correctly with evidence of good carry over with min to no verbal or tactile cues .   Baseline: NT  Goal status: ONGOING   2.  Patient will demonstrate understanding of how to use TENS unit to relieve left sided radiating low back pain with movement by independently placing pads on her low back in box pattern and turning on unit to proper setting.   Baseline: NT  04/13/24: Pt educated on use of TENS with use of handout  Goal status: ACHIEVED     LONG TERM GOALS: Target date: 06/24/2024  Patient will improve modified Oswestry Disability Index (MODI) score by decreasing initial score by >=13% as evidence of the minimal statistically significant change for improvement with low back pain disability and improvement in low back function (Copay et al, 2008) Baseline: 70% (35/50) Goal status: ONGOING   2.  Patient will be able to perform supine<>transfer with supervision as evidence of improved mobility and core and hip strength to remain independent.  Baseline: min A to help transfer from side lying to sit  Goal status: ONGOING    3.  Patient will perform TUG test in <12 sec as evidence of improved LLE function and mobility to decrease risk of falling and to continue to remain independent.   Baseline: 13.70 sec   Goal status: ONGOING   4. Pt will increase by at least 12.2 m (40 ft) in order to demonstrate clinically significant improvement in cardiopulmonary endurance and community ambulation. Normie & Debby, 2009). Baseline: 312 feet (9.5 x 10 M walk with rollator)   Goal status: ONGOING   5.  Patient will be able to perform sit to stand without use of UE support from 18 inch mat height as evidence of improved LE strength.   Baseline: Needs use of BUE support to stand up from a seated position  Goal status: ONGOING    PLAN:  PT FREQUENCY: 1-2x/week  PT DURATION: 12 weeks  PLANNED INTERVENTIONS: 97164- PT Re-evaluation, 97750- Physical Performance Testing, 97110-Therapeutic exercises, 97530- Therapeutic activity, 97112- Neuromuscular re-education, 97535- Self Care, [id number]- Manual therapy, [phone]- Gait training, [phone]- Orthotic Initial, [phone]- Aquatic Therapy, [phone]- Electrical stimulation (unattended), [phone]- Electrical stimulation (manual), [id number]- Traction (mechanical), 20560 (1-2 muscles), 20561 (3+ muscles)- Dry Needling,  Patient/Family education, Balance training, Stair training, Taping, Joint mobilization, Joint manipulation, Spinal manipulation, Spinal mobilization, Vestibular training, DME instructions, Cryotherapy, and Moist heat.  PLAN FOR NEXT SESSION: Ongoing use of TENS to promote movement: focus on nerve glides on LLE and modify sit to stand so that patient can perform without limitations with neuropathic pain.   Maryanne Finder, PT, DPT Physical Therapist - [place]  [facility]

## 2024-04-27 ENCOUNTER — Ambulatory Visit: Admitting: Physical Therapy

## 2024-04-29 ENCOUNTER — Other Ambulatory Visit: Payer: Self-pay

## 2024-04-29 ENCOUNTER — Encounter
Admission: RE | Admit: 2024-04-29 | Discharge: 2024-04-29 | Disposition: A | Discharge status: Home / Self Care | Source: Ambulatory Visit | Attending: Neurosurgery | Admitting: Neurosurgery

## 2024-04-29 VITALS — BP 131/96 | HR 99 | Resp 16 | Wt 190.7 lb

## 2024-04-29 DIAGNOSIS — Z01812 Encounter for preprocedural laboratory examination: Secondary | ICD-10-CM | POA: Diagnosis present

## 2024-04-29 DIAGNOSIS — M48061 Spinal stenosis, lumbar region without neurogenic claudication: Secondary | ICD-10-CM

## 2024-04-29 HISTORY — DX: Atrial premature depolarization: I49.1

## 2024-04-29 HISTORY — DX: Radiculopathy, lumbar region: M54.16

## 2024-04-29 HISTORY — DX: Ventricular premature depolarization: I49.3

## 2024-04-29 HISTORY — DX: Angina pectoris, unspecified: I20.9

## 2024-04-29 HISTORY — DX: Spinal stenosis, site unspecified: M48.00

## 2024-04-29 HISTORY — DX: Spinal stenosis, lumbar region without neurogenic claudication: M48.061

## 2024-04-29 HISTORY — DX: Gastro-esophageal reflux disease without esophagitis: K21.9

## 2024-04-29 LAB — URINALYSIS, COMPLETE (UACMP) WITH MICROSCOPIC
Bilirubin Urine: NEGATIVE
Glucose, UA: NEGATIVE mg/dL
Hgb urine dipstick: NEGATIVE
Ketones, ur: NEGATIVE mg/dL
Leukocytes,Ua: NEGATIVE
Nitrite: NEGATIVE
Protein, ur: NEGATIVE mg/dL
Specific Gravity, Urine: 1.023 (ref 1.005–1.030)
pH: 6 (ref 5.0–8.0)

## 2024-04-29 LAB — SURGICAL PCR SCREEN
MRSA, PCR: NEGATIVE
Staphylococcus aureus: NEGATIVE

## 2024-04-29 LAB — TYPE AND SCREEN
ABO/RH(D): O POS
Antibody Screen: NEGATIVE

## 2024-04-29 NOTE — Progress Notes (Signed)
 Chief Complaint:   Chief Complaint  Patient presents with  . Follow-up    Subjective:   Erika Crawford is a 83 y.o. female in today for follow up  History of Present Illness Erika Crawford is an 83 year old female who presents for evaluation of surgical candidacy for chronic back pain.  She has been experiencing chronic back pain for a long time and also been followed by pain clinic.  However the left lumbar radiculitis has gotten much worse over the past few months.  She has been chronically on hydrocodone , received back injections and none of it is helping.  Seen neurosurgery as she failed physical therapy and they are in the process of scheduling laminectomy for her soon. Very tearful and crying today.  She has severe pain radiating down her left leg. The pain is described as the worst she has ever experienced, primarily affecting her groin and buttock area, and is exacerbated by movement. Some relief is found when lying still in bed or sitting in a chair.  She has been evaluated by multiple specialists, including orthopedics, podiatry, physiatry and neurosurgery and the pain clinic, and has received various treatments, including injections, without significant relief. She is currently taking pain medication, which provides inconsistent relief, and she sometimes struggles with sleep due to the pain.  Blood pressure is only slightly elevated as she is emotional and in pain.  Taking all her other medications well.   Current Outpatient Medications  Medication Sig Dispense Refill  . aspirin  81 MG EC tablet Take 81 mg by mouth once daily    . azelastine (ASTELIN) 137 mcg nasal spray Place 1 spray into both nostrils 2 (two) times daily 10 mL 1  . azelastine (OPTIVAR) 0.05 % ophthalmic solution as needed    . cholecalciferol  (VITAMIN D3) 1000 unit tablet Take 50 mcg by mouth once daily    . cyanocobalamin (VITAMIN B12) 1000 MCG tablet Take 1,000 mcg by mouth once daily    .  cyclobenzaprine (FLEXERIL) 10 MG tablet Take 1 tablet (10 mg total) by mouth 2 (two) times daily as needed for Muscle spasms 30 tablet 0  . diclofenac (VOLTAREN) 1 % topical gel Apply 2 g topically 4 (four) times daily    . dilTIAZem (CARDIZEM CD) 120 MG XR capsule Take 1 capsule (120 mg total) by mouth once daily 90 capsule 3  . ezetimibe (ZETIA) 10 mg tablet Take 1 tablet (10 mg total) by mouth once daily 90 tablet 3  . fluticasone  propionate (FLONASE ) 50 mcg/actuation nasal spray SHAKE LIQUID AND USE 2 SPRAYS IN EACH NOSTRIL EVERY DAY 16 g 2  . hydrALAZINE (APRESOLINE) 25 MG tablet TAKE 1 TABLET BY MOUTH TWICE DAILY FOR ELEVATED BLOOD PRESSURE SYMPTOMS 60 tablet 0  . HYDROcodone -acetaminophen  (NORCO) 5-325 mg tablet Take 1 tablet by mouth 3 (three) times daily Take 1 tablet twice daily and an extra one in the afternoon if needed for pain 90 tablet 0  . levothyroxine  (SYNTHROID ) 88 MCG tablet TAKE 1 TABLET BY MOUTH EVERY MORNING 30 TO 60 MINUTES BEFORE BREAKFAST(6:30 AM) ON AN EMPTY STOMACH AND WITH GLASS OF WATER 90 tablet 1  . MAGNESIUM  ORAL Take 500 mg by mouth Two tablets daily    . mometasone (NASONEX) 50 mcg/actuation nasal spray Place 2 sprays into both nostrils once daily    . olmesartan (BENICAR) 40 MG tablet Take 1 tablet (40 mg total) by mouth once daily 90 tablet 3  . omeprazole (PRILOSEC) 20 MG  DR capsule TAKE 1 CAPSULE BY MOUTH EVERY DAY ON AN EMPTY STOMACH FIRST THING IN THE MORNING 90 capsule 1  . polyethylene glycol (MIRALAX) packet Take 17 g by mouth at bedtime as needed    . pramipexole (MIRAPEX) 1 MG tablet Take 1 tablet (1 mg total) by mouth at bedtime as needed 90 tablet 1  . sennosides-docusate (SENOKOT-S) 8.6-50 mg tablet Take 1 tablet by mouth 2 (two) times daily as needed for Constipation    . gabapentin  (NEURONTIN ) 300 MG capsule TAKE 1 CAPSULE BY MOUTH IN THE MORNING AND 2 CAPSULES AT BEDTIME 90 capsule 2   No current facility-administered medications for this visit.     Allergies as of 04/29/2024 - Reviewed 04/29/2024  Allergen Reaction Noted  . Other Unknown and Other (See Comments) 08/09/2014  . Pravastatin Other (See Comments) 04/24/2015  . Codeine Nausea And Vomiting 10/27/2014  . Etodolac Nausea And Vomiting and Rash 04/18/2010  . Hydrochlorothiazide Other (See Comments) 08/17/2014  . Oxycodone  Nausea And Vomiting 08/09/2014  . Celebrex [celecoxib] Hives 07/23/2012  . Relafen [nabumetone] Unknown 11/04/2013  . Tramadol  Nausea and Vomiting 07/02/2010  . Zanaflex [tizanidine] Other (See Comments) 01/28/2017    Past Medical History:  Diagnosis Date  . Adenomatous polyps 2007   colonoscopy  . Allergic state   . Apnea   . Chickenpox   . GERD (gastroesophageal reflux disease)   . Hypertension   . Hypothyroid   . Lumbar disc disease   . Obesity   . Osteoarthritis    Lumbar disc disease, knees.  . Osteoporosis, post-menopausal    Lumbar compression fracture status post kyphoplasty.  Alendronate.  . Shingles 11/10/2022  . Sleep apnea    Obstructive sleep apnea on CPAP at 9 cm of water.    Past Surgical History:  Procedure Laterality Date  . COLONOSCOPY  07/05/2005   adenomatous polyp  . COLONOSCOPY  07/17/2010   Normal per Dr. Ora.  Repeat 07/18/2015.  . L2 compression fracture  02/01/2014  . COLONOSCOPY  09/21/2015   Normal Colon/No Repeat/PYO  . Left TKA using all-cemented biomet Vangaurd system with 67.5mm PCR femur a 71mm tibial tray with a 10 mm E-Poly insert and a 34 x 8.5 mm all-poly 3 pegged domed patella  Left 03/21/2016   Dr.poggi   . REVERSE RIGHT TOTAL SHOULDER ARTHROPLASTY Right 08/25/2018   POGGI  . COLONOSCOPY  04/24/2020   Tubular adenomas/Repeat 26yrs/CTL  . CHOLECYSTECTOMY    . HYSTERECTOMY       Family History  Problem Relation Name Age of Onset  . Arthritis Mother    . High blood pressure (Hypertension) Mother    . Diverticulitis Mother    . Diabetes type II Father    . Arthritis Sister    . Colon  polyps Sister    . Rheum arthritis Cousin    . Colon cancer Neg Hx      Social History:  reports that she has quit smoking. Her smoking use included cigarettes. She has been exposed to tobacco smoke. She has never used smokeless tobacco. She reports that she does not drink alcohol and does not use drugs.  Results for orders placed or performed in visit on 04/27/24  CARD US  carotid bilateral   Narrative   EXAM: CARD US  carotid bilateral  INDICATION:   Carotid atherosclerosis, left [I65.22 (ICD-10-CM)] Vision changes [H53.9 (ICD-10-CM)]  COMPARISON: 07/18/2021  TECHNIQUE: Gray-scale ultrasound, Color Doppler, and spectral Doppler  evaluation of bilateral extracranial carotid artery systems and  vertebral  arteries in the neck.  FINDINGS: RIGHT: On the right, there is no significant atherosclerotic plaque.  Evidence of hemodynamically significant stenosis in the internal carotid  artery: None.      - Peak Systolic Velocity (cm/sec): 92      - End Diastolic Velocity (cm/sec): 33 No evidence of hemodynamically significant plaque in the common or  external carotid arteries.  Flow in the right vertebral artery is antegrade.  LEFT: On the left, there is mild plaque in the carotid bulb.  Evidence of hemodynamically significant stenosis in the internal carotid  artery: None.      - Peak Systolic Velocity (cm/sec): 107      - End Diastolic Velocity (cm/sec): 38 No evidence of hemodynamically significant plaque in the common or  external carotid arteries.  Flow in the left vertebral artery is antegrade.    Impression   No hemodynamically significant stenosis in the internal carotid arteries.  BRENDAN CHRISTOPHER CLINE, MD      *Note: Due to a large number of results and/or encounters for the requested time period, some results have not been displayed. A complete set of results can be found in Results Review.      ROS:  General: No fever, chills or recent illness. No change  in weight  HEENT: No change in vision or hearing. No pain or difficulty with swallowing Respiratory: No cough or shortness of breath CV:  No chest pain or palpitations GI:  No pain, dyspepsia or change in bowel habits GU:  No dysuria, frequency, or hesitancy MSK:  Right knee pain, lower back pain on the left side radiating down the left leg. Neurological: No headaches, changes in mental status, loss of sensation or strength   Psychological: No anxiety insomnia or depression  Objective:   Body mass index is 29.76 kg/m.  BP (!) 152/76   Pulse 98   Ht 170.2 cm (5' 7)   Wt 86.2 kg (190 lb)   BMI 29.76 kg/m   General: WD/WN female, in no acute distress HEENT: Pupils equal and round, EOMI. oral mucosa moist.  Oropharynx clear. Neck: supple, trachea midline; no thyromegaly Respiratory:clear to auscultation.  No dullness to percussion.  No use of accessory muscles. Cardiac:  Regular rate and rhythm without murmur, gallops, or rubs Vascular: No carotid bruit and radials 2+; distal pulses 2+ Abdominal:soft, nontender, positive bowel sounds.  No organomegaly. Musculoskeletal:  No clubbing, cyanosis or edema.  Difficulty with spinal flexion and also left leg hip flexion with knee extension Neuro: CN grossly intact.  No acute decrease in sensation in the upper and lower extremities bilaterally.  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    2.  Hypertension- - Blood pressure is slightly elevated today - Continue hydralazine, Cardizem, Benicar - Low-salt diet.  3.  GERD- - Continue Prilosec   Follow-up with me in 1 month for follow-up of left-sided low back  pain, restless legs and hypertension     Goals     .  Lose Weight      Pt stated would like to get weight down and improve pain    . * Maintain health/healthy lifestyle (pt-stated)        LAVENIA BEAVER, MD  Portions of this note were created using dictation software and may contain typographical errors.  *Some images could not be shown.

## 2024-04-29 NOTE — Patient Instructions (Addendum)
 Your procedure is scheduled on: 05/06/24 - Thursday Report to the Registration Desk on the 1st floor of [facility]. To find out your arrival time, please call [phone] between 1PM - 3PM on: 05/05/24 - Wednesday If your arrival time is 6:00 am, do not arrive before that time as the [facility] entrance doors do not open until 6:00 am.  REMEMBER: Instructions that are not followed completely may result in serious medical risk, up to and including death; or upon the discretion of your surgeon and anesthesiologist your surgery may need to be rescheduled.  Do not eat food after midnight the night before surgery.  No gum chewing or hard candies.  You may however, drink CLEAR liquids up to 2 hours before you are scheduled to arrive for your surgery. Do not drink anything within 2 hours of your scheduled arrival time.  Clear liquids include: - water  - apple juice without pulp - gatorade (not RED colors) - black coffee or tea (Do NOT add milk or creamers to the coffee or tea) Do NOT drink anything that is not on this list.  Stop beginning 11/13, ANY OVER THE COUNTER supplements until after surgery - VITAMIN D3  ,Magnesium  .  HOLD olmesartan (BENICAR) on the morning of surgery.  ON THE DAY OF SURGERY ONLY TAKE THESE MEDICATIONS WITH SIPS OF WATER:  amLODipine  (NORVASC )  diltiazem (CARDIZEM CD)  gabapentin  (NEURONTIN )  levothyroxine  (SYNTHROID ) omeprazole (PRILOSEC)   You may continue pain medications on up until the day of surgery if needed.  No Alcohol for 24 hours before or after surgery.  No Smoking including e-cigarettes for 24 hours before surgery.  No chewable tobacco products for at least 6 hours before surgery.  No nicotine patches on the day of surgery.  Do not use any recreational drugs for at least a week (preferably 2 weeks) before your surgery.  Please be advised that the combination of cocaine and anesthesia may have negative outcomes, up to and including  death. If you test positive for cocaine, your surgery will be cancelled.  On the morning of surgery brush your teeth with toothpaste and water, you may rinse your mouth with mouthwash if you wish. Do not swallow any toothpaste or mouthwash.  Use CHG Soap or wipes as directed on instruction sheet.  Do not wear jewelry, make-up, hairpins, clips or nail polish.  For welded (permanent) jewelry: bracelets, anklets, waist bands, etc.  Please have this removed prior to surgery.  If it is not removed, there is a chance that hospital personnel will need to cut it off on the day of surgery.  Do not wear lotions, powders, or perfumes.   Do not shave body hair from the neck down 48 hours before surgery.  Contact lenses, hearing aids and dentures may not be worn into surgery.  Do not bring valuables to the hospital. [facility] is not responsible for any missing/lost belongings or valuables.   Bring your C-PAP to the hospital in case you may have to spend the night.   Notify your doctor if there is any change in your medical condition (cold, fever, infection).  Wear comfortable clothing (specific to your surgery type) to the hospital.  After surgery, you can help prevent lung complications by doing breathing exercises.  Take deep breaths and cough every 1-2 hours. Your doctor may order a device called an Incentive Spirometer to help you take deep breaths.  If you are being admitted to the hospital overnight, leave your  suitcase in the car. After surgery it may be brought to your room.  In case of increased patient census, it may be necessary for you, the patient, to continue your postoperative care in the Same Day Surgery department.  If you are being discharged the day of surgery, you will not be allowed to drive home. You will need a responsible individual to drive you home and stay with you for 24 hours after surgery.   If you are taking public transportation, you will need to have a  responsible individual with you.  Please call the Pre-admissions Testing Dept. at [phone] if you have any questions about these instructions.  Surgery Visitation Policy:  Patients having surgery or a procedure may have two visitors.  Children under the age of 59 must have an adult with them who is not the patient.  Inpatient Visitation:    Visiting hours are 7 a.m. to 8 p.m. Up to four visitors are allowed at one time in a patient room. The visitors may rotate out with other people during the day.  One visitor age 9 or older may stay with the patient overnight and must be in the room by 8 p.m.   Merchandiser, Retail to address health-related social needs:  [email or web address]   Pre-operative 4 CHG Bath Instructions   You can play a key role in reducing the risk of infection after surgery. Your skin needs to be as free of germs as possible. You can reduce the number of germs on your skin by washing with CHG (chlorhexidine  gluconate) soap before surgery. CHG is an antiseptic soap that kills germs and continues to kill germs even after washing.   DO NOT use if you have an allergy to chlorhexidine /CHG or antibacterial soaps. If your skin becomes reddened or irritated, stop using the CHG and notify one of our RNs at [phone].   Please shower with the CHG soap starting 4 days before surgery using the following schedule: 11/16 - 11/19.    Please keep in mind the following:  DO NOT shave, including legs and underarms, starting the day of your first shower.   You may shave your face at any point before/day of surgery.  Place clean sheets on your bed the day you start using CHG soap. Use a clean washcloth (not used since being washed) for each shower. DO NOT sleep with pets once you start using the CHG.   CHG Shower Instructions:  If you choose to wash your hair and private area, wash first with your normal shampoo/soap.  After you use shampoo/soap, rinse  your hair and body thoroughly to remove shampoo/soap residue.  Turn the water OFF and apply about 3 tablespoons (45 ml) of CHG soap to a CLEAN washcloth.  Apply CHG soap ONLY FROM YOUR NECK DOWN TO YOUR TOES (washing for 3-5 minutes)  DO NOT use CHG soap on face, private areas, open wounds, or sores.  Pay special attention to the area where your surgery is being performed.  If you are having back surgery, having someone wash your back for you may be helpful. Wait 2 minutes after CHG soap is applied, then you may rinse off the CHG soap.  Pat dry with a clean towel  Put on clean clothes/pajamas   If you choose to wear lotion, please use ONLY the CHG-compatible lotions on the back of this paper.     Additional instructions for the day of surgery: DO NOT APPLY any lotions, deodorants, cologne, or perfumes.  Put on clean/comfortable clothes.  Brush your teeth.  Ask your nurse before applying any prescription medications to the skin.      CHG Compatible Lotions   Aveeno Moisturizing lotion  Cetaphil Moisturizing Cream  Cetaphil Moisturizing Lotion  Clairol Herbal Essence Moisturizing Lotion, Dry Skin  Clairol Herbal Essence Moisturizing Lotion, Extra Dry Skin  Clairol Herbal Essence Moisturizing Lotion, Normal Skin  Curel Age Defying Therapeutic Moisturizing Lotion with Alpha Hydroxy  Curel Extreme Care Body Lotion  Curel Soothing Hands Moisturizing Hand Lotion  Curel Therapeutic Moisturizing Cream, Fragrance-Free  Curel Therapeutic Moisturizing Lotion, Fragrance-Free  Curel Therapeutic Moisturizing Lotion, Original Formula  Eucerin Daily Replenishing Lotion  Eucerin Dry Skin Therapy Plus Alpha Hydroxy Crme  Eucerin Dry Skin Therapy Plus Alpha Hydroxy Lotion  Eucerin Original Crme  Eucerin Original Lotion  Eucerin Plus Crme Eucerin Plus Lotion  Eucerin TriLipid Replenishing Lotion  Keri Anti-Bacterial Hand Lotion  Keri Deep Conditioning Original Lotion Dry Skin Formula Softly  Scented  Keri Deep Conditioning Original Lotion, Fragrance Free Sensitive Skin Formula  Keri Lotion Fast Absorbing Fragrance Free Sensitive Skin Formula  Keri Lotion Fast Absorbing Softly Scented Dry Skin Formula  Keri Original Lotion  Keri Skin Renewal Lotion Keri Silky Smooth Lotion  Keri Silky Smooth Sensitive Skin Lotion  Nivea Body Creamy Conditioning Oil  Nivea Body Extra Enriched Teacher, Adult Education Moisturizing Lotion Nivea Crme  Nivea Skin Firming Lotion  NutraDerm 30 Skin Lotion  NutraDerm Skin Lotion  NutraDerm Therapeutic Skin Cream  NutraDerm Therapeutic Skin Lotion  ProShield Protective Hand Cream  Provon moisturizing lotion

## 2024-05-04 ENCOUNTER — Ambulatory Visit: Admitting: Physical Therapy

## 2024-05-05 ENCOUNTER — Other Ambulatory Visit: Payer: Self-pay

## 2024-05-05 MED ORDER — CHLORHEXIDINE GLUCONATE 0.12 % MT SOLN
15.0000 mL | Freq: Once | OROMUCOSAL | Status: AC
  Administered 2024-05-06: 15 mL via OROMUCOSAL

## 2024-05-05 MED ORDER — LACTATED RINGERS IV SOLN: INTRAVENOUS | Status: DC

## 2024-05-05 MED ORDER — ORAL CARE MOUTH RINSE: 15.0000 mL | Freq: Once | OROMUCOSAL | Status: AC

## 2024-05-06 ENCOUNTER — Ambulatory Visit
Admission: RE | Admit: 2024-05-06 | Discharge: 2024-05-06 | Disposition: A | Discharge status: Home / Self Care | Attending: Neurosurgery | Admitting: Neurosurgery

## 2024-05-06 ENCOUNTER — Encounter: Admission: RE | Disposition: A | Payer: Self-pay | Source: Home / Self Care | Attending: Neurosurgery

## 2024-05-06 ENCOUNTER — Ambulatory Visit: Payer: Self-pay | Admitting: Urgent Care

## 2024-05-06 ENCOUNTER — Encounter: Payer: Self-pay | Admitting: Neurosurgery

## 2024-05-06 ENCOUNTER — Ambulatory Visit

## 2024-05-06 ENCOUNTER — Other Ambulatory Visit: Payer: Self-pay

## 2024-05-06 DIAGNOSIS — Z87891 Personal history of nicotine dependence: Secondary | ICD-10-CM | POA: Diagnosis not present

## 2024-05-06 DIAGNOSIS — M48062 Spinal stenosis, lumbar region with neurogenic claudication: Secondary | ICD-10-CM | POA: Insufficient documentation

## 2024-05-06 DIAGNOSIS — G4730 Sleep apnea, unspecified: Secondary | ICD-10-CM | POA: Diagnosis not present

## 2024-05-06 DIAGNOSIS — I10 Essential (primary) hypertension: Secondary | ICD-10-CM | POA: Insufficient documentation

## 2024-05-06 DIAGNOSIS — R29898 Other symptoms and signs involving the musculoskeletal system: Secondary | ICD-10-CM

## 2024-05-06 DIAGNOSIS — M48061 Spinal stenosis, lumbar region without neurogenic claudication: Secondary | ICD-10-CM | POA: Diagnosis present

## 2024-05-06 DIAGNOSIS — M5416 Radiculopathy, lumbar region: Secondary | ICD-10-CM | POA: Diagnosis not present

## 2024-05-06 DIAGNOSIS — G8929 Other chronic pain: Secondary | ICD-10-CM | POA: Diagnosis present

## 2024-05-06 DIAGNOSIS — Z79899 Other long term (current) drug therapy: Secondary | ICD-10-CM | POA: Insufficient documentation

## 2024-05-06 DIAGNOSIS — K219 Gastro-esophageal reflux disease without esophagitis: Secondary | ICD-10-CM | POA: Insufficient documentation

## 2024-05-06 DIAGNOSIS — Z01818 Encounter for other preprocedural examination: Secondary | ICD-10-CM

## 2024-05-06 DIAGNOSIS — M545 Low back pain, unspecified: Secondary | ICD-10-CM

## 2024-05-06 DIAGNOSIS — E039 Hypothyroidism, unspecified: Secondary | ICD-10-CM | POA: Insufficient documentation

## 2024-05-06 HISTORY — PX: DECOMPRESSIVE LUMBAR LAMINECTOMY LEVEL 1: SHX5791

## 2024-05-06 SURGERY — DECOMPRESSIVE LUMBAR LAMINECTOMY LEVEL 1
Anesthesia: General | Laterality: Left

## 2024-05-06 MED ORDER — SODIUM CHLORIDE (PF) 0.9 % IJ SOLN
INTRAMUSCULAR | Status: DC | PRN
  Administered 2024-05-06: 30 mL

## 2024-05-06 MED ORDER — CHLORHEXIDINE GLUCONATE 0.12 % MT SOLN
OROMUCOSAL | Status: AC
  Filled 2024-05-06: qty 15

## 2024-05-06 MED ORDER — CEFAZOLIN IN SODIUM CHLORIDE 2-0.9 GM/100ML-% IV SOLN
2.0000 g | Freq: Once | INTRAVENOUS | Status: AC
  Administered 2024-05-06: 2 g via INTRAVENOUS
  Filled 2024-05-06: qty 100

## 2024-05-06 MED ORDER — ACETAMINOPHEN 10 MG/ML IV SOLN
INTRAVENOUS | Status: DC | PRN
  Administered 2024-05-06: 1000 mg via INTRAVENOUS

## 2024-05-06 MED ORDER — HYDROCODONE-ACETAMINOPHEN 7.5-325 MG PO TABS
1.0000 | ORAL_TABLET | Freq: Once | ORAL | Status: AC | PRN
  Administered 2024-05-06: 1 via ORAL
  Filled 2024-05-06: qty 1

## 2024-05-06 MED ORDER — FENTANYL CITRATE (PF) 100 MCG/2ML IJ SOLN
25.0000 ug | INTRAMUSCULAR | Status: DC | PRN
  Administered 2024-05-06: 25 ug via INTRAVENOUS
  Administered 2024-05-06: 50 ug via INTRAVENOUS
  Administered 2024-05-06 (×3): 25 ug via INTRAVENOUS

## 2024-05-06 MED ORDER — CEFAZOLIN SODIUM-DEXTROSE 2-4 GM/100ML-% IV SOLN
INTRAVENOUS | Status: AC
  Filled 2024-05-06: qty 100

## 2024-05-06 MED ORDER — ACETAMINOPHEN 10 MG/ML IV SOLN
INTRAVENOUS | Status: AC
  Filled 2024-05-06: qty 100

## 2024-05-06 MED ORDER — SURGIFLO WITH THROMBIN (HEMOSTATIC MATRIX KIT) OPTIME
TOPICAL | Status: DC | PRN
  Administered 2024-05-06: 1 via TOPICAL

## 2024-05-06 MED ORDER — DEXAMETHASONE SOD PHOSPHATE PF 10 MG/ML IJ SOLN
INTRAMUSCULAR | Status: DC | PRN
  Administered 2024-05-06: 5 mg via INTRAVENOUS

## 2024-05-06 MED ORDER — ACETAMINOPHEN 500 MG PO TABS
1000.0000 mg | ORAL_TABLET | Freq: Three times a day (TID) | ORAL | 0 refills | Status: AC
  Filled 2024-05-06: qty 42, 7d supply, fill #0

## 2024-05-06 MED ORDER — ONDANSETRON HCL 4 MG/2ML IJ SOLN
INTRAMUSCULAR | Status: DC | PRN
  Administered 2024-05-06: 4 mg via INTRAVENOUS

## 2024-05-06 MED ORDER — FENTANYL CITRATE (PF) 100 MCG/2ML IJ SOLN
INTRAMUSCULAR | Status: AC
  Filled 2024-05-06: qty 2

## 2024-05-06 MED ORDER — PROPOFOL 10 MG/ML IV BOLUS
INTRAVENOUS | Status: AC
  Filled 2024-05-06: qty 20

## 2024-05-06 MED ORDER — SENNA 8.6 MG PO TABS
1.0000 | ORAL_TABLET | Freq: Every day | ORAL | 0 refills | Status: AC
  Filled 2024-05-06: qty 14, 14d supply, fill #0

## 2024-05-06 MED ORDER — OXYCODONE HCL 5 MG PO TABS
5.0000 mg | ORAL_TABLET | Freq: Four times a day (QID) | ORAL | 0 refills | Status: AC | PRN
  Filled 2024-05-06: qty 56, 7d supply, fill #0

## 2024-05-06 MED ORDER — FENTANYL CITRATE (PF) 100 MCG/2ML IJ SOLN
INTRAMUSCULAR | Status: DC | PRN
  Administered 2024-05-06 (×2): 50 ug via INTRAVENOUS

## 2024-05-06 MED ORDER — PROPOFOL 10 MG/ML IV BOLUS
INTRAVENOUS | Status: DC | PRN
  Administered 2024-05-06: 110 mg via INTRAVENOUS

## 2024-05-06 MED ORDER — DOCUSATE SODIUM 100 MG PO CAPS
100.0000 mg | ORAL_CAPSULE | Freq: Two times a day (BID) | ORAL | 0 refills | Status: AC
  Filled 2024-05-06: qty 28, 14d supply, fill #0

## 2024-05-06 MED ORDER — HYDROCODONE-ACETAMINOPHEN 7.5-325 MG PO TABS
ORAL_TABLET | ORAL | Status: AC
  Filled 2024-05-06: qty 1

## 2024-05-06 MED ORDER — METHYLPREDNISOLONE ACETATE 40 MG/ML IJ SUSP
INTRAMUSCULAR | Status: DC | PRN
  Administered 2024-05-06: 20 mg

## 2024-05-06 MED ORDER — SUGAMMADEX SODIUM 200 MG/2ML IV SOLN
INTRAVENOUS | Status: DC | PRN
  Administered 2024-05-06: 200 mg via INTRAVENOUS

## 2024-05-06 MED ORDER — ROCURONIUM BROMIDE 100 MG/10ML IV SOLN
INTRAVENOUS | Status: DC | PRN
  Administered 2024-05-06: 50 mg via INTRAVENOUS

## 2024-05-06 MED ORDER — BUPIVACAINE-EPINEPHRINE (PF) 0.5% -1:200000 IJ SOLN
INTRAMUSCULAR | Status: DC | PRN
  Administered 2024-05-06: 10 mL

## 2024-05-06 MED ORDER — LIDOCAINE HCL (CARDIAC) PF 100 MG/5ML IV SOSY
PREFILLED_SYRINGE | INTRAVENOUS | Status: DC | PRN
  Administered 2024-05-06: 60 mg via INTRAVENOUS

## 2024-05-06 MED ORDER — 0.9 % SODIUM CHLORIDE (POUR BTL) OPTIME
TOPICAL | Status: DC | PRN
  Administered 2024-05-06: 150 mL

## 2024-05-06 SURGICAL SUPPLY — 30 items
BASIN KIT SINGLE STR (MISCELLANEOUS) ×1 IMPLANT
BRUSH SCRUB EZ 4% CHG (MISCELLANEOUS) ×1 IMPLANT
BUR NEURO DRILL SOFT 3.0X3.8M (BURR) ×1 IMPLANT
DERMABOND ADVANCED .7 DNX12 (GAUZE/BANDAGES/DRESSINGS) ×1 IMPLANT
DRAPE C-ARM XRAY 36X54 (DRAPES) ×2 IMPLANT
DRAPE LAPAROTOMY 100X77 ABD (DRAPES) ×1 IMPLANT
DRAPE SPINE LEICA/WILD 54X150 (DRAPES) ×1 IMPLANT
DRSG OPSITE POSTOP 3X4 (GAUZE/BANDAGES/DRESSINGS) ×1 IMPLANT
DRSG TEGADERM 4X4.75 (GAUZE/BANDAGES/DRESSINGS) IMPLANT
ELECTRODE EZSTD 165MM 6.5IN (MISCELLANEOUS) ×1 IMPLANT
ELECTRODE REM PT RTRN 9FT ADLT (ELECTROSURGICAL) ×1 IMPLANT
GLOVE BIOGEL PI IND STRL 7.0 (GLOVE) ×1 IMPLANT
GLOVE BIOGEL PI IND STRL 8 (GLOVE) ×2 IMPLANT
GLOVE SURG SYN 7.0 PF PI (GLOVE) ×1 IMPLANT
GLOVE SURG SYN 7.5 PF PI (GLOVE) ×1 IMPLANT
GOWN SRG XL LVL 3 NONREINFORCE (GOWNS) ×1 IMPLANT
GOWN STRL REUS W/ TWL LRG LVL3 (GOWN DISPOSABLE) ×1 IMPLANT
KIT WILSON FRAME (KITS) ×1 IMPLANT
NDL SAFETY ECLIP 18X1.5 (MISCELLANEOUS) ×1 IMPLANT
NS IRRIG 500ML POUR BTL (IV SOLUTION) ×1 IMPLANT
PACK LAMINECTOMY ARMC (PACKS) ×1 IMPLANT
PAD ARMBOARD POSITIONER FOAM (MISCELLANEOUS) ×2 IMPLANT
SURGIFLO W/THROMBIN 8M KIT (HEMOSTASIS) ×1 IMPLANT
SUT MNCRL AB 4-0 PS2 18 (SUTURE) IMPLANT
SUT STRATA 3-0 15 PS-2 (SUTURE) ×1 IMPLANT
SUT VIC AB 0 CT1 27XCR 8 STRN (SUTURE) ×1 IMPLANT
SUT VIC AB 2-0 CT1 18 (SUTURE) ×1 IMPLANT
SYR 30ML LL (SYRINGE) ×2 IMPLANT
SYR 3ML LL SCALE MARK (SYRINGE) ×1 IMPLANT
TRAP FLUID SMOKE EVACUATOR (MISCELLANEOUS) ×1 IMPLANT

## 2024-05-06 NOTE — Anesthesia Preprocedure Evaluation (Signed)
 Anesthesia Evaluation  Patient identified by MRN, date of birth, ID band Patient awake    Reviewed: Allergy & Precautions, NPO status , Patient's Chart, lab work & pertinent test results  History of Anesthesia Complications Negative for: history of anesthetic complications  Airway Mallampati: II  TM Distance: >3 FB Neck ROM: Full    Dental  (+) Partial Upper, Dental Advidsory Given   Pulmonary neg shortness of breath, sleep apnea and Continuous Positive Airway Pressure Ventilation , neg COPD, neg recent URI, former smoker   breath sounds clear to auscultation- rhonchi (-) wheezing      Cardiovascular hypertension, Pt. on medications (-) angina (-) CAD, (-) Past MI, (-) Cardiac Stents and (-) CABG  Rhythm:Regular Rate:Normal - Systolic murmurs and - Diastolic murmurs    Neuro/Psych neg Seizures  negative psych ROS   GI/Hepatic Neg liver ROS,GERD  ,,  Endo/Other  neg diabetesHypothyroidism    Renal/GU Renal disease     Musculoskeletal  (+) Arthritis ,    Abdominal  (+) + obese  Peds  Hematology  (+) Blood dyscrasia, anemia   Anesthesia Other Findings Past Medical History: No date: Arthritis No date: Complication of anesthesia     Comment:  dizziness No date: Hypertension No date: Hypothyroidism No date: Sleep apnea     Comment:  cpap No date: Thyroid disease   Reproductive/Obstetrics                              Anesthesia Physical Anesthesia Plan  ASA: 2  Anesthesia Plan: General   Post-op Pain Management:    Induction: Intravenous  PONV Risk Score and Plan: 3 and TIVA and Propofol  infusion  Airway Management Planned:   Additional Equipment:   Intra-op Plan:   Post-operative Plan:   Informed Consent: I have reviewed the patients History and Physical, chart, labs and discussed the procedure including the risks, benefits and alternatives for the proposed anesthesia with  the patient or authorized representative who has indicated his/her understanding and acceptance.     Dental advisory given  Plan Discussed with: CRNA  Anesthesia Plan Comments:          Anesthesia Quick Evaluation

## 2024-05-06 NOTE — Anesthesia Postprocedure Evaluation (Signed)
 Anesthesia Post Note  Patient: Erika Crawford  Procedure(s) Performed: Left Sided Approach to Minimally Invasive Laminectomy, medial facetectomy, and lateral recess decompression (Left)  Patient location during evaluation: PACU Anesthesia Type: General Level of consciousness: awake and alert Pain management: pain level controlled Vital Signs Assessment: post-procedure vital signs reviewed and stable Respiratory status: spontaneous breathing, nonlabored ventilation, respiratory function stable and patient connected to nasal cannula oxygen Cardiovascular status: blood pressure returned to baseline and stable Postop Assessment: no apparent nausea or vomiting Anesthetic complications: no   No notable events documented.   Last Vitals:  Vitals:   05/06/24 1115 05/06/24 1130  BP: 136/87 (!) 153/83  Pulse: 88 86  Resp: 19 16  Temp:  (!) 36.4 C  SpO2: 99% 96%    Last Pain:  Vitals:   05/06/24 1130  TempSrc: Temporal  PainSc: 3                  Shau-Shau Melia

## 2024-05-06 NOTE — Interval H&P Note (Signed)
 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  The various methods of treatment have been discussed with the patient and family. After consideration of risks, benefits and other options for treatment, the patient has consented to  Procedure(s) with comments: Left Sided Approach to Minimally Invasive Laminectomy, medial facetectomy, and lateral recess decompression (Left) - Left Sided Approach to L4-5 Minimally Invasive Laminectomy, medial facetectomy, and lateral recess decompression as a surgical intervention.  The patient's history has been reviewed, patient examined, no change in status, stable for surgery.  I have reviewed the patient's chart and labs.  Questions were answered to the patient's satisfaction.    Heart and lungs are clear   Penne LELON Sharps

## 2024-05-06 NOTE — Discharge Instructions (Addendum)
 Your surgeon has performed an operation on your lumbar spine (low back) to relieve pressure on one or more nerves. Many times, patients feel better immediately after surgery and can "overdo it." Even if you feel well, it is important that you follow these activity guidelines. If you do not let your back heal properly from the surgery, you can increase the chance of a disc herniation and/or return of your symptoms. The following are instructions to help in your recovery once you have been discharged from the hospital.  * It is ok to take NSAIDs after surgery.  You were given a small prescription of pain medication, while you are taking this you should also take a stool softener.  A stool softener was prescribed for you, commonly these are not covered by insurance, should this be the case you can often obtain this over-the-counter for a lower price.  Activity    No bending, lifting, or twisting ("BLT"). Avoid lifting objects heavier than 10 pounds (gallon milk jug).  Where possible, avoid household activities that involve lifting, bending, pushing, or pulling such as laundry, vacuuming, grocery shopping, and childcare. Try to arrange for help from friends and family for these activities while your back heals.  Increase physical activity slowly as tolerated.  Taking short walks is encouraged, but avoid strenuous exercise. Do not jog, run, bicycle, lift weights, or participate in any other exercises unless specifically allowed by your doctor. Avoid prolonged sitting, including car rides.  Talk to your doctor before resuming sexual activity.  You should not drive until cleared by your doctor.  Until released by your doctor, you should not return to work or school.  You should rest at home and let your body heal.   You may shower three days after your surgery.  After showering, lightly dab your incision dry. Do not take a tub bath or go swimming for 3 weeks, or until approved by your doctor at your  follow-up appointment.  If you smoke, we strongly recommend that you quit.  Smoking has been proven to interfere with normal healing in your back and will dramatically reduce the success rate of your surgery. Please contact [facility] (800-QUIT-NOW) and use the resources at www.[facility].com for assistance in stopping smoking.  Surgical Incision   If you have a dressing on your incision, you may remove it three days after your surgery. Keep your incision area clean and dry.  If you have staples or stitches on your incision, you should have a follow up scheduled for removal. If you do not have staples or stitches, you will have steri-strips (small pieces of surgical tape) or Dermabond glue. The steri-strips/glue should begin to peel away within about a week (it is fine if the steri-strips fall off before then). If the strips are still in place one week after your surgery, you may gently remove them.  Diet            You may return to your usual diet. Be sure to stay hydrated.  When to Contact us  Although your surgery and recovery will likely be uneventful, you may have some residual numbness, aches, and pains in your back and/or legs. This is normal and should improve in the next few weeks.  However, should you experience any of the following, contact us immediately: New numbness or weakness Pain that is progressively getting worse, and is not relieved by your pain medications or rest Bleeding, redness, swelling, pain, or drainage from surgical incision Chills or flu-like symptoms  Fever greater than 101.0 F (38.3 C) Problems with bowel or bladder functions Difficulty breathing or shortness of breath Warmth, tenderness, or swelling in your calf  Contact Information How to contact us:  If you have any questions/concerns before or after surgery, you can reach [place] at [phone], or you can send a mychart message. We can be reached by phone or mychart 8am-4pm, Monday-Friday.  *Please note:  Calls after 4pm are forwarded to a third party answering service. Mychart messages are not routinely monitored during evenings, weekends, and holidays. Please call our office to contact the answering service for urgent concerns during non-business hours.

## 2024-05-06 NOTE — Anesthesia Procedure Notes (Signed)
 Procedure Name: Intubation Date/Time: 05/06/2024 9:00 AM  Performed by: Lennie Lamarr HERO, CRNAPre-anesthesia Checklist: Patient identified, Emergency Drugs available, Suction available and Patient being monitored Patient Re-evaluated:Patient Re-evaluated prior to induction Oxygen Delivery Method: Circle System Utilized Preoxygenation: Pre-oxygenation with 100% oxygen Induction Type: IV induction Ventilation: Mask ventilation without difficulty Laryngoscope Size: McGrath and 4 Grade View: Grade I Tube type: Oral Tube size: 7.0 mm Number of attempts: 1 Airway Equipment and Method: Stylet and Oral airway Placement Confirmation: ETT inserted through vocal cords under direct vision, positive ETCO2 and breath sounds checked- equal and bilateral Secured at: 21 cm Tube secured with: Tape Dental Injury: Teeth and Oropharynx as per pre-operative assessment

## 2024-05-06 NOTE — Transfer of Care (Signed)
 Immediate Anesthesia Transfer of Care Note  Patient: Erika Crawford  Procedure(s) Performed: Left Sided Approach to Minimally Invasive Laminectomy, medial facetectomy, and lateral recess decompression (Left)  Patient Location: PACU  Anesthesia Type:General  Level of Consciousness: drowsy and patient cooperative  Airway & Oxygen Therapy: Patient Spontanous Breathing and Patient connected to face mask oxygen  Post-op Assessment: Report given to RN, Post -op Vital signs reviewed and stable, and Patient moving all extremities X 4  Post vital signs: Reviewed and stable  Last Vitals:  Vitals Value Taken Time  BP 140/75 05/06/24 10:18  Temp    Pulse 60 05/06/24 10:21  Resp 16 05/06/24 10:21  SpO2 100 % 05/06/24 10:21  Vitals shown include unfiled device data.  Last Pain:  Vitals:   05/06/24 0720  TempSrc: Temporal  PainSc: 10-Worst pain ever         Complications: No notable events documented.

## 2024-05-06 NOTE — Op Note (Signed)
 Indications: Erika Crawford is suffering from lumbar radiculopathy. The patient tried and failed conservative management, prompting surgical intervention.  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   EBL: Minimal IVF: see anesthesia record Drains: none Disposition: Extubated and Stable to PACU Complications: none  No foley catheter was placed.   Preoperative Note:   Risks of surgery discussed include: infection, bleeding, stroke, coma, death, paralysis, CSF leak, nerve/spinal cord injury, numbness, tingling, weakness, complex regional pain syndrome, recurrent stenosis and/or disc herniation, vascular injury, development of instability, neck/back pain, need for further surgery, persistent symptoms, development of deformity, and the risks of anesthesia. The patient understood these risks and agreed to proceed.  Operative Note:   1) Left L4-5 laminectomy, medial facetectomy, lateral recess decompression  The patient was then brought from the preoperative center with intravenous access established.  The patient underwent general anesthesia and endotracheal tube intubation, and was then rotated on the [place] rail top where all pressure points were appropriately padded.  The skin was then thoroughly cleansed.  Perioperative antibiotic prophylaxis was administered.  Sterile prep and drapes were then applied and a timeout was then observed.  C-arm was brought into the field under sterile conditions, and the L 4-5 disc space identified and marked with an incision on the left 1cm lateral to midline.  Once this was complete a 2 cm incision was opened with the use of a #10 blade knife.  The Metrx  tubes were sequentially advanced under lateral fluoroscopy until a 18 x 60 mm Metrx tube was placed over the facet and lamina and secured to the bed.    The microscope was then sterilely brought into the field and muscle creep was hemostased with a bipolar and resected with a pituitary rongeur.  A Bovie extender was then used to expose the spinous process and lamina.  Careful attention was placed to not violate the facet capsule. A 3 mm matchstick drill bit was then used to make a hemi-laminotomy trough until the ligamentum flavum was exposed.  This was extended to the base of the spinous process.  We brought this all the way up to the level where the ligamentum was no longer inserting on the underside of the lamina and extended the caudally to the superior aspect of the L5 lamina.  Once this was complete and the underlying ligamentum flavum was visualized, the ligamentum was dissected with an up angle curette and resected with a #2 and #3 mm biting Kerrison.  The laminotomy opening was also expanded in similar fashion and hemostasis was obtained with Surgifoam and a patty as well as bone wax.  The rostral aspect of the caudal level of the lamina was also resected with a #2 biting Kerrison effort to further enhance exposure.  Once the underlying dura was visualized a [place]field 4 was then used to dissect and expose the traversing nerve root.  There was some continued lateral recess stenosis which was relieved with further rongeur utilization.    Depo-Medrol  was placed along the nerve root.  The area was irrigated. The tube system was then removed under microscopic visualization and hemostasis was obtained with a bipolar.    The fascial layer was reapproximated with the use of a 0- Vicryl suture.  Subcutaneous tissue layer was reapproximated using 2-0 Vicryl suture.  3-0  monocryl was used on the skin. The skin was then cleansed and Dermabond was used to close the skin opening.  Patient was then rotated back to  the preoperative bed awakened from anesthesia and taken to recovery all counts are correct in this case.   I performed the entire procedure with the assistance of  Lyle Decamp, [place] as an designer, television/film set. An assistant was required for this procedure due to the complexity.  The assistant provided assistance in tissue manipulation and suction, closure and was required for the successful and safe performance of the procedure. I performed the critical portions of the procedure.   Penne LELON Sharps, MD [facility] Neurosurgery

## 2024-05-07 ENCOUNTER — Encounter: Payer: Self-pay | Admitting: Neurosurgery

## 2024-05-10 ENCOUNTER — Ambulatory Visit: Admitting: Physical Therapy

## 2024-05-10 ENCOUNTER — Ambulatory Visit: Admitting: Neurosurgery

## 2024-05-12 ENCOUNTER — Telehealth: Payer: Self-pay | Admitting: Neurosurgery

## 2024-05-12 NOTE — Telephone Encounter (Signed)
 Patient reports pain in the left buttock, describing a pulling or stretching sensation that interferes with sleep. They are concerned whether this is normal post-surgery, as the pain feels similar to what they experienced before the procedure. The leg is asymptomatic.

## 2024-05-12 NOTE — Telephone Encounter (Signed)
 Called patient to discuss her concerns of ongoing pain post surgery. States that she is having the same pulling stretching sensation in her left buttock that she was having prior to surgery. She denies any new or worsening pain. She states that she can't sleep, she can't lay on that side, and can't sit on that side in the chair. Patient poor historian and unable to provide details on medication names, dosing, and timing, stating that she is just in so much pain and can't remember what she is taking.  Patient voices concerns that her last injection with Dr. Marcelino in May has caused her pain to get a whole lot worse.   She specifically voices concerns that it is hard to sleep at night. When asked what medications she takes at night, she stated only tylenol . Patient unable to provide list of any other medications she takes at bedtime. She stated that her paperwork from the hospital told her not to take anything that was not prescribed, so she has only been taking tylenol  at night. Referred her to pages 4-7 of AVS to detailed medication list. Told her that she can take tylenol , oxycodone , flexeril, and gabapentin . Patient advised to hold norco while taking oxycodone  (patient stated that she had been taking hydrocodone , but unsure how often or how many).   Additionally advised patient to start keeping a log of the medications she is taking and when so that she can ensure she is taking medications appropriately and that we can provide better recommendations.   Patient agreeable with plan to keep a log of medications and review detailed medication list on AVS to determine what medications she should be taking and how often. Advised her to call if she experiences new or worsening symptoms.

## 2024-05-18 ENCOUNTER — Telehealth: Payer: Self-pay

## 2024-05-18 ENCOUNTER — Other Ambulatory Visit: Payer: Self-pay

## 2024-05-18 ENCOUNTER — Ambulatory Visit: Admitting: Physical Therapy

## 2024-05-18 DIAGNOSIS — M5416 Radiculopathy, lumbar region: Secondary | ICD-10-CM

## 2024-05-18 DIAGNOSIS — M48062 Spinal stenosis, lumbar region with neurogenic claudication: Secondary | ICD-10-CM

## 2024-05-18 DIAGNOSIS — R29898 Other symptoms and signs involving the musculoskeletal system: Secondary | ICD-10-CM

## 2024-05-18 DIAGNOSIS — G8929 Other chronic pain: Secondary | ICD-10-CM

## 2024-05-18 DIAGNOSIS — M48061 Spinal stenosis, lumbar region without neurogenic claudication: Secondary | ICD-10-CM

## 2024-05-18 NOTE — Telephone Encounter (Signed)
-----   Message from Almarie ORN sent at 05/18/2024 10:51 AM EST ----- Patient called and is needing a referral sent to [facility] for her appointment today at 2:30.   She was told that she needs a new referral based on PT needs post surgery.

## 2024-05-18 NOTE — Telephone Encounter (Signed)
 Erika Almarie LITTIE Claudene Penne LELON, MD; P [facility]-Neurosurgery Rn Patient called and is needing a referral sent to [facility] for her appointment today at 2:30.  She was told that she needs a new referral based on PT needs post surgery.

## 2024-05-19 ENCOUNTER — Ambulatory Visit: Admitting: Neurosurgery

## 2024-05-19 ENCOUNTER — Encounter: Payer: Self-pay | Admitting: Neurosurgery

## 2024-05-19 VITALS — BP 130/70 | Temp 98.3°F | Wt 190.0 lb

## 2024-05-19 DIAGNOSIS — M48062 Spinal stenosis, lumbar region with neurogenic claudication: Secondary | ICD-10-CM

## 2024-05-19 DIAGNOSIS — Z9889 Other specified postprocedural states: Secondary | ICD-10-CM | POA: Insufficient documentation

## 2024-05-19 NOTE — Progress Notes (Signed)
     HISTORY OF PRESENT ILLNESS: 05/19/2024 Ms. Rollene Gavel is status post MIS lumbar laminectomy.  Overall she feels that she is doing very well.  She states that her leg pain and back pain have improved significantly.  She is having postoperative soreness as expected.  Her incision is healing well.SABRA   PHYSICAL EXAMINATION:   Vitals:   05/19/24 1444  BP: 130/70  Temp: 98.3 F (36.8 C)   General: Patient is well developed, well nourished, calm, collected, and in no apparent distress.  NEUROLOGICAL:  General: In no acute distress.  Awake, alert, oriented to person, place, and time. Pupils equal round and reactive to light.   Strength: No new major deficits noted  Incision c/d/i   ROS (Neurologic): Negative except as noted above  IMAGING: No interval imaging to review   Assessment and Plan Assessment & Plan Postoperative care following lumbar spine surgery for severe lumbar stenosis causing neurogenic claudication and radiculopathy Postoperative recovery is progressing well. Incision site is healing appropriately with some residual glue, which is normal and will flake off naturally. Reports significant improvement in leg pain and heaviness, though soreness persists, which is expected. No new neurological deficits reported. Driving is permissible as long as she is not on new postoperative pain medications. - Continue physical therapy as scheduled until February. - Advised to inform physical therapy if sessions are too early and adjust schedule accordingly. - Scheduled follow-up appointment around the new year or shortly after.  Penne MICAEL Sharps, MD/MS Department of Neurosurgery

## 2024-05-20 ENCOUNTER — Ambulatory Visit: Admitting: Physical Therapy

## 2024-05-25 ENCOUNTER — Ambulatory Visit: Admitting: Physical Therapy

## 2024-05-25 ENCOUNTER — Encounter: Payer: Self-pay | Admitting: Physical Therapy

## 2024-05-25 DIAGNOSIS — M5416 Radiculopathy, lumbar region: Secondary | ICD-10-CM | POA: Diagnosis present

## 2024-05-25 DIAGNOSIS — M5459 Other low back pain: Secondary | ICD-10-CM | POA: Diagnosis present

## 2024-05-25 NOTE — Therapy (Signed)
 OUTPATIENT PHYSICAL THERAPY THORACOLUMBAR RE-EVAL   Patient Name: Erika Crawford MRN: [id number] DOB:30-Oct-1940, 83 y.o., female Today's Date: 05/25/2024  END OF SESSION:  PT End of Session - 05/25/24 1433     Visit Number 4    Number of Visits 24    Date for Recertification  06/24/24    Authorization Type [employer] Dual Medicare    Authorization - Number of Visits 4    Progress Note Due on Visit 10    PT Start Time 1430    PT Stop Time 1515    PT Time Calculation (min) 45 min    Equipment Utilized During Treatment Gait belt    Activity Tolerance Patient tolerated treatment well    Behavior During Therapy WFL for tasks assessed/performed             Past Medical History:  Diagnosis Date   Anginal pain    Arthritis    Complication of anesthesia    dizziness   GERD (gastroesophageal reflux disease)    Hypertension    Hypothyroidism    Lumbar foraminal stenosis    Lumbar radiculopathy    PAC (premature atrial contraction)    PVC (premature ventricular contraction)    Sleep apnea    cpap   Spinal stenosis    Left-sided L4-L5 lumbar   Thyroid disease    Past Surgical History:  Procedure Laterality Date   ABDOMINAL HYSTERECTOMY     BACK SURGERY     lower middle kyphoplasty   CHOLECYSTECTOMY     COLONOSCOPY WITH PROPOFOL  N/A 09/21/2015   Procedure: COLONOSCOPY WITH PROPOFOL ;  Surgeon: Deward CINDERELLA Piedmont, MD;  Location: [facility];  Service: Gastroenterology;  Laterality: N/A;   COLONOSCOPY WITH PROPOFOL  N/A 04/24/2020   Procedure: COLONOSCOPY WITH PROPOFOL ;  Surgeon: Maryruth Ole DASEN, MD;  Location: [facility];  Service: Endoscopy;  Laterality: N/A;   DECOMPRESSIVE LUMBAR LAMINECTOMY LEVEL 1 Left 05/06/2024   Procedure: Left Sided Approach to Minimally Invasive Laminectomy, medial facetectomy, and lateral recess decompression;  Surgeon: Claudene Penne ORN, MD;  Location: [facility];  Service: Neurosurgery;  Laterality: Left;  Left Sided Approach to L4-5 Minimally  Invasive Laminectomy, medial facetectomy, and lateral recess decompression   JOINT REPLACEMENT     03/19/2016- left knee replacement   REVERSE SHOULDER ARTHROPLASTY Right 08/25/2018   Procedure: REVERSE SHOULDER ARTHROPLASTY, RIGHT;  Surgeon: Edie Norleen PARAS, MD;  Location: [facility];  Service: Orthopedics;  Laterality: Right;   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   Procedure: TOTAL KNEE ARTHROPLASTY;  Surgeon: Norleen PARAS Edie, MD;  Location: [facility];  Service: Orthopedics;  Laterality: Left;   Patient Active Problem List   Diagnosis Date Noted   Status post lumbar laminectomy 05/19/2024   Weakness of left lower extremity 04/23/2024   Lumbar foraminal stenosis 04/23/2023   Chronic pain syndrome 04/23/2023   Status post reverse total shoulder replacement, right 08/25/2018   Chronic left-sided low back pain with left-sided sciatica 06/22/2018   Vitamin B 12 deficiency 01/02/2018   Chronic anemia 09/06/2017   Chronic constipation 06/08/2017   Lumbar radiculopathy 05/22/2017   Spinal stenosis, lumbar region, with neurogenic claudication 03/19/2017   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 03/10/2017   Acute bilateral low back pain without sciatica 02/04/2017   Dysuria 12/13/2016   Prediabetes 11/05/2016   Acquired hypothyroidism 08/24/2016   Myofascial pain syndrome 08/24/2016   Essential hypertension 08/24/2016   Osteopenia of multiple sites 08/24/2016   Lumbar degenerative disc disease 08/24/2016  Bilateral hydronephrosis 07/15/2016   Rotator cuff tendinitis, left 06/21/2016   Status post total knee replacement using cement, 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   Rotator cuff tendinitis, right 11/27/2015   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   Injury of tendon of long head of right biceps 11/27/2015   Incomplete tear of right rotator cuff 11/27/2015   Obstructive sleep apnea on CPAP 07/13/2015   Osteoarthritis 11/04/2013   Disorder of uterus 04/29/1995    PCP: Dr. Lavenia Beaver    REFERRING PROVIDER: Dr.Bilal Lateef    REFERRING DIAG:  M54.16 (ICD-10-CM) - Lumbar radiculopathy M48.061 (ICD-10-CM) - Lumbar foraminal stenosis M48.062 (ICD-10-CM) - Spinal stenosis, lumbar region, with neurogenic claudication G89.4 (ICD-10-CM) - Chronic pain syndrome  Rationale for Evaluation and Treatment: Rehabilitation  THERAPY DIAG:  Other low back pain  Chronic left-sided lumbar radiculopathy  ONSET DATE: June  2025   SUBJECTIVE:                                                                                                                                                                                           SUBJECTIVE STATEMENT:  Pt reports that she is feeling pain down her left leg and that she is only feeling soreness at this point. She had a minimally invasive lumbar decompression surgery on November 20th.    PERTINENT HISTORY:   Dr. Penne Smith's Note on 05/19/24           Postoperative care following lumbar spine surgery for severe lumbar stenosis causing neurogenic claudication and radiculopathy Postoperative recovery is progressing well. Incision site is healing appropriately with some residual glue, which is normal and will flake off naturally. Reports significant improvement in leg pain and heaviness, though soreness persists, which is expected. No new neurological deficits reported. Driving is permissible as long as she is not on new postoperative pain medications. - Continue physical therapy as scheduled until February. - Advised to inform physical therapy if sessions are too early and adjust schedule accordingly. - Scheduled follow-up appointment around the new year or shortly after.    Per Dr. Charolotte note on 02/10/24  Patient states that she is having increased lumbar radicular pain that is making it very challenging for her to walk. She is currently on hydrocodone  5 mg 3 times daily as needed as well as gabapentin  300 mg 3 times a day. I  recommend a Medrol  Dosepak as below. She has an appointment already scheduled for September 11 at which point we will reevaluate. Future considerations could include bilateral L4 transforaminal ESI versus referral to neurosurgery for evaluation for surgical decompression at L4-L5.     PAIN:  Are you having pain? Yes: NPRS scale: 3-4/10   Pain location: Left Buttocks down posterior thigh into anterior ankle    Pain description: Throbbing    Aggravating factors: Bending forward to pick up something    Relieving factors: Pain medication helps    PRECAUTIONS: None  RED FLAGS: None   WEIGHT BEARING RESTRICTIONS: No  FALLS:  Has patient fallen in last 6 months? No  LIVING ENVIRONMENT: Lives with: lives alone Lives in: House/apartment Stairs: No Has following equipment at home: Single point cane and Environmental Consultant - 4 wheeled  OCCUPATION: Retired    PLOF: Independent  PATIENT GOALS: Pt wants to feel less pain so she can return to shopping and cooking without being limited by pain.    NEXT MD VISIT: Did not ask    OBJECTIVE:  Note: Objective measures were completed at Evaluation unless otherwise noted.  VITALS BP  137/66 HR 91 SpO2  100%  DIAGNOSTIC FINDINGS:  CLINICAL DATA:  Left lower extremity radiculopathy.   EXAM: MRI LUMBAR SPINE WITHOUT CONTRAST   TECHNIQUE: Multiplanar, multisequence MR imaging of the lumbar spine was performed. No intravenous contrast was administered.   COMPARISON:  MRI of the lumbar spine dated March 18, 2017.   FINDINGS: Segmentation:  5 non rib-bearing lumbar type vertebrae.   Alignment: Grade 1 anterolisthesis at L4-5 and slight retrolisthesis at L2-3 and L3-4.   Vertebrae: Chronic moderate to severe compression deformity of L2 status post augmentation. There is persistent retropulsion of the posterior wall causing moderate central spinal canal stenosis, as before. Mild interval worsening of degenerative anterolisthesis at L4-5. No osseous  lesions.   Conus medullaris and cauda equina: Conus extends to the L1-2 level. Conus and cauda equina appear normal.   Paraspinal and other soft tissues: There are bilateral parapelvic renal cysts present. No follow-up is necessary. The paraspinous soft tissues are otherwise unremarkable.   Disc levels:   T12-L1: Normal.   L1-2: Mild disc bulging and retropulsion of the posterosuperior corner of L2, resulting in moderate central spinal canal stenosis. The neural foramina are unremarkable. There is no nerve root impingement.   L2-3: Broad-based disc bulging and bilateral facet hypertrophy, causing moderate central spinal canal stenosis and moderate bilateral lateral recess stenosis. No definite nerve root impingement.   L3-4: Broad-based disc bulging and bilateral facet hypertrophy, with moderate central spinal canal stenosis and mild-to-moderate bilateral lateral recess stenosis. No apparent nerve root impingement.   L4-5: Grade 1 anterolisthesis with marked bilateral facet arthrosis, causing severe central spinal canal stenosis and bilateral lateral recess stenosis, with impingement of the L5 nerves in the lateral recesses bilaterally. There is also moderate bilateral neural foraminal stenosis.   L5-S1: Mild disc bulging and bilateral facet hypertrophy, with mild central spinal canal stenosis and mild bilateral neural foraminal stenosis.   IMPRESSION: 1. Interval worsening of degenerative anterolisthesis at L4-5, now with severe central spinal canal stenosis, bilateral lateral recess stenosis and impingement of the L5 nerves bilaterally. 2. Chronic moderate to severe compression deformity of L2 status post augmentation. Persistent moderate central spinal canal stenosis.     Electronically Signed   By: Evalene Coho M.D.   On: 01/14/2024 09:57  PATIENT SURVEYS:  Modified Oswestry:  MODIFIED OSWESTRY DISABILITY SCALE  Date: 04/01/24 Score  Pain intensity 4 =   Pain medication provides me with little relief from pain.  2. Personal care (washing, dressing, etc.) 3 =  I need help, but I am able to manage most of my personal care.  3. Lifting 5 =  I cannot lift or carry anything at all.  4. Walking 4 = I can only walk with crutches or a cane.  5. Sitting 2 =  Pain prevents me from sitting more than 1 hour.  6. Standing 2 =  Pain prevents me from standing more than 1 hour  7. Sleeping 2 =  Even when I take pain medication, I sleep less than 6 hours  8. Social Life 4 =  Pain has restricted my social life to my home  9. Traveling 5 = My pain prevents all travel except for visits to the physician/therapist or hospital  10. Employment/ Homemaking 4 = Pain prevents me from doing even light duties.  Total 35/50 (70%)   Interpretation of scores: Score Category Description  0-20% Minimal Disability The patient can cope with most living activities. Usually no treatment is indicated apart from advice on lifting, sitting and exercise  21-40% Moderate Disability The patient experiences more pain and difficulty with sitting, lifting and standing. Travel and social life are more difficult and they may be disabled from work. Personal care, sexual activity and sleeping are not grossly affected, and the patient can usually be managed by conservative means  41-60% Severe Disability Pain remains the main problem in this group, but activities of daily living are affected. These patients require a detailed investigation  61-80% Crippled Back pain impinges on all aspects of the patient's life. Positive intervention is required  81-100% Bed-bound These patients are either bed-bound or exaggerating their symptoms  Bluford FORBES Zoe DELENA Karon DELENA, et al. Surgery versus conservative management of stable thoracolumbar fracture: the PRESTO feasibility RCT. [place] ([place]): [employer]; 2021 Nov. [facility] Technology Assessment, No. 25.62.) Appendix 3, Oswestry Disability Index  category descriptors. Available from: Findjewelers.cz  Minimally Clinically Important Difference (MCID) = 12.8%  COGNITION: Overall cognitive status: Within functional limits for tasks assessed     SENSATION: WFL  MUSCLE LENGTH: Hamstrings: Right 90 deg; Left 70 deg* Thomas test: Not performed    POSTURE: rounded shoulders and forward head  PALPATION:   LUMBAR ROM:   AROM eval  Flexion 100%*  Extension 100%  Right lateral flexion 100%  Left lateral flexion 100%  Right rotation 100%  Left rotation 100%   (Blank rows = not tested)  LOWER EXTREMITY ROM:     Active  Right eval Left eval  Hip flexion    Hip extension    Hip abduction    Hip adduction    Hip internal rotation    Hip external rotation    Knee flexion    Knee extension    Ankle dorsiflexion    Ankle plantarflexion    Ankle inversion    Ankle eversion     (Blank rows = not tested)  LOWER EXTREMITY MMT:    MMT Right eval Left eval  Hip flexion 4 4-*  Hip extension    Hip External Rotation in Sitting 4 4  Hip adduction 4 4  Hip internal rotation    Knee flexion 4 4  Knee extension 4 4  Ankle dorsiflexion 4 4  Ankle plantarflexion    Ankle inversion    Ankle eversion     (Blank rows = not tested)  LUMBAR SPECIAL TESTS:  Straight leg raise test: Positive  FUNCTIONAL TESTS:  5 times sit to stand: NT  30 seconds chair stand test Timed up and go (TUG): NT 2 minute walk test: 312 ft 10 meter walk test:  NT  GAIT: Distance walked: 50 ft   Assistive device utilized: Environmental Consultant - 4 wheeled Level of assistance: Modified independence Comments: Decreased step length bilaterally   TREATMENT DATE:   05/25/24:   THERAPEUTIC ACTIVITY  Review  bending, lifting, and twisting precautions using handout provided to patient.  -Demonstrated log roll to maintain neutral spine from sit<>supine  and had patient perform with minimal cueing.   -Demonstrated how to stand from  seated position while maintaining upright trunk to avoid bending at waist     -Pt performed sit to stand 1 x 10 with 1 UE support       -min VC to maintain upright posture  -Demonstrate how to rotate using feet rather than rotating at trunk   THEREX  Modified Oswestry Disability Index (MODI):  21/50 (42%) Seated Ankle Pumps  1 x 10   Sit to Stand from 18 inch mat height: Needs to use UE support    TUG: 14 sec with use of rollator    PATIENT EDUCATION:  Education details: Form and technique for correct performance of exercise and explanation about deficits resulting from L4 nerve impingement  Person educated: Patient Education method: Programmer, Multimedia, Demonstration, Verbal cues, and Handouts Education comprehension: verbalized understanding, returned demonstration, and verbal cues required  HOME EXERCISE PROGRAM: Access Code: [email or web address] URL: [email or web address] Date: 05/25/2024 Prepared by: Toribio Servant  Exercises - Sitting to Supine Roll  - 1 x daily - 7 x weekly - 1 sets - 5 reps - Supine Ankle Pumps  - 1 x daily - 7 x weekly - 2 sets - 10 reps - Seated Transversus Abdominis Bracing  - 1 x daily - 7 x weekly - 2 sets - 10 reps - 3 sec  hold  Patient Education - Spinal Precautions  ASSESSMENT:  CLINICAL IMPRESSION: Pt presents nearly 3 weeks s/p left L4-L5 medial facetectomy, lateral recess decompression. Despite having surgery, pt continues to progress towards goals and on goals that she is not progressing with she presents near baseline like TUG score. PT focused on educating patient on spinal precautions with having patient perform each precaution to better learn how to properly perform movement. She did not experience an increase in her LLE pain with exception of sit to stand where she did experience and increase. She will continue to benefit from skilled PT to reduce her left sided low back pain and improve her mobility, os that she is not limited by pain.    OBJECTIVE IMPAIRMENTS: Abnormal gait, decreased balance, decreased coordination, decreased knowledge of condition, decreased mobility, difficulty walking, decreased ROM, decreased strength, hypomobility, impaired flexibility, impaired UE functional use, postural dysfunction, obesity, and pain.   ACTIVITY LIMITATIONS: carrying, lifting, bending, standing, squatting, sleeping, stairs, transfers, bed mobility, bathing, toileting, dressing, hygiene/grooming, and locomotion level  PARTICIPATION LIMITATIONS: meal prep, cleaning, laundry, shopping, community activity, and church  PERSONAL FACTORS: Age, Fitness, and 3+ comorbidities: HTN, severe right knee OA, chronic pain syndrome are also affecting patient's functional outcome.   REHAB POTENTIAL: Good  CLINICAL DECISION MAKING: Stable/uncomplicated  EVALUATION COMPLEXITY: Low   GOALS: Goals reviewed with patient? No  SHORT TERM GOALS: Target date: 04/15/2024  Patient will demonstrate undestanding of home exercise plan by performing exercises correctly with evidence of good carry over with min to no verbal or tactile cues .   Baseline: NT 04/13/24: Performing exercises independently  Goal status: ACHIEVED    2.  Patient will demonstrate understanding of how to use TENS unit to relieve left sided radiating low  back pain with movement by independently placing pads on her low back in box pattern and turning on unit to proper setting.  Baseline: NT  04/13/24: Pt educated on use of TENS with use of handout  Goal status: ACHIEVED     LONG TERM GOALS: Target date: 06/24/2024  Patient will improve modified Oswestry Disability Index (MODI) score by decreasing initial score by >=13% as evidence of the minimal statistically significant change for improvement with low back pain disability and improvement in low back function (Copay et al, 2008) Baseline: 70% (35/50) Goal status: ONGOING   2.  Patient will be able to perform supine<>transfer with  supervision as evidence of improved mobility and core and hip strength to remain independent.  Baseline: min A to help transfer from side lying to sit 05/25/24: Supervision with sit<>supine   Goal status: ACHIEVED    3.  Patient will perform TUG test in <12 sec as evidence of improved LLE function and mobility to decrease risk of falling and to continue to remain independent.   Baseline: 13.70 sec  05/25/24: 14 sec  with use of 2WW  Goal status: ONGOING   4. Pt will increase by at least 12.2 m (40 ft) in order to demonstrate clinically significant improvement in cardiopulmonary endurance and community ambulation. Normie & Debby, 2009). Baseline: 312 feet (9.5 x 10 M walk with rollator)   Goal status: ONGOING   5.  Patient will be able to perform sit to stand without use of UE support from 18 inch mat height as evidence of improved LE strength.   Baseline: Needs use of BUE support to stand up from a seated position   05/25/24: Needs to use 1 UE support and unable to do without UE support   Goal status: ONGOING    PLAN:  PT FREQUENCY: 1-2x/week  PT DURATION: 12 weeks  PLANNED INTERVENTIONS: 97164- PT Re-evaluation, 97750- Physical Performance Testing, 97110-Therapeutic exercises, 97530- Therapeutic activity, 97112- Neuromuscular re-education, 97535- Self Care, [id number]- Manual therapy, [id number]- Gait training, [phone]- Orthotic Initial, [phone]- Aquatic Therapy, [phone]- Electrical stimulation (unattended), [phone]- Electrical stimulation (manual), [id number]- Traction (mechanical), 20560 (1-2 muscles), 20561 (3+ muscles)- Dry Needling, Patient/Family education, Balance training, Stair training, Taping, Joint mobilization, Joint manipulation, Spinal manipulation, Spinal mobilization, Vestibular training, DME instructions, Cryotherapy, and Moist heat.  PLAN FOR NEXT SESSION: Reassess remainder of long term goals given pt's change in medical status having just had spinal surgery. and continue to focus  on log rolls and demonstrating understanding of precautions.   Toribio Servant PT, DPT  [facility] [address] [place], [address] Phone: [phone]   Fax:  [phone]

## 2024-05-27 ENCOUNTER — Encounter: Payer: Self-pay | Admitting: Physical Therapy

## 2024-05-27 ENCOUNTER — Ambulatory Visit: Admitting: Physical Therapy

## 2024-05-27 DIAGNOSIS — M5459 Other low back pain: Secondary | ICD-10-CM | POA: Diagnosis not present

## 2024-05-27 DIAGNOSIS — M5416 Radiculopathy, lumbar region: Secondary | ICD-10-CM

## 2024-05-27 NOTE — Therapy (Addendum)
 OUTPATIENT PHYSICAL THERAPY THORACOLUMBAR PROGRESS NOTE  Patient Name: Erika Crawford MRN: [id number] DOB:1941-04-08, 83 y.o., female Today's Date: 05/27/2024  END OF SESSION:  PT End of Session - 05/27/24 1438     Visit Number 5    Number of Visits 24    Date for Recertification  06/24/24    Authorization Type [employer] Dual Medicare    Authorization - Visit Number 5    Authorization - Number of Visits --    Progress Note Due on Visit 10    PT Start Time 1435    PT Stop Time 1515    PT Time Calculation (min) 40 min    Equipment Utilized During Treatment Gait belt    Activity Tolerance Patient tolerated treatment well    Behavior During Therapy WFL for tasks assessed/performed             Past Medical History:  Diagnosis Date   Anginal pain    Arthritis    Complication of anesthesia    dizziness   GERD (gastroesophageal reflux disease)    Hypertension    Hypothyroidism    Lumbar foraminal stenosis    Lumbar radiculopathy    PAC (premature atrial contraction)    PVC (premature ventricular contraction)    Sleep apnea    cpap   Spinal stenosis    Left-sided L4-L5 lumbar   Thyroid disease    Past Surgical History:  Procedure Laterality Date   ABDOMINAL HYSTERECTOMY     BACK SURGERY     lower middle kyphoplasty   CHOLECYSTECTOMY     COLONOSCOPY WITH PROPOFOL  N/A 09/21/2015   Procedure: COLONOSCOPY WITH PROPOFOL ;  Surgeon: Deward CINDERELLA Piedmont, MD;  Location: [facility];  Service: Gastroenterology;  Laterality: N/A;   COLONOSCOPY WITH PROPOFOL  N/A 04/24/2020   Procedure: COLONOSCOPY WITH PROPOFOL ;  Surgeon: Maryruth Ole DASEN, MD;  Location: [facility];  Service: Endoscopy;  Laterality: N/A;   DECOMPRESSIVE LUMBAR LAMINECTOMY LEVEL 1 Left 05/06/2024   Procedure: Left Sided Approach to Minimally Invasive Laminectomy, medial facetectomy, and lateral recess decompression;  Surgeon: Claudene Penne ORN, MD;  Location: [facility];  Service: Neurosurgery;  Laterality: Left;   Left Sided Approach to L4-5 Minimally Invasive Laminectomy, medial facetectomy, and lateral recess decompression   JOINT REPLACEMENT     03/19/2016- left knee replacement   REVERSE SHOULDER ARTHROPLASTY Right 08/25/2018   Procedure: REVERSE SHOULDER ARTHROPLASTY, RIGHT;  Surgeon: Edie Norleen PARAS, MD;  Location: [facility];  Service: Orthopedics;  Laterality: Right;   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   Procedure: TOTAL KNEE ARTHROPLASTY;  Surgeon: Norleen PARAS Edie, MD;  Location: [facility];  Service: Orthopedics;  Laterality: Left;   Patient Active Problem List   Diagnosis Date Noted   Status post lumbar laminectomy 05/19/2024   Weakness of left lower extremity 04/23/2024   Lumbar foraminal stenosis 04/23/2023   Chronic pain syndrome 04/23/2023   Status post reverse total shoulder replacement, right 08/25/2018   Chronic left-sided low back pain with left-sided sciatica 06/22/2018   Vitamin B 12 deficiency 01/02/2018   Chronic anemia 09/06/2017   Chronic constipation 06/08/2017   Lumbar radiculopathy 05/22/2017   Spinal stenosis, lumbar region, with neurogenic claudication 03/19/2017   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 03/10/2017   Acute bilateral low back pain without sciatica 02/04/2017   Dysuria 12/13/2016   Prediabetes 11/05/2016   Acquired hypothyroidism 08/24/2016   Myofascial pain syndrome 08/24/2016   Essential hypertension 08/24/2016   Osteopenia of multiple sites 08/24/2016  Lumbar degenerative disc disease 08/24/2016   Bilateral hydronephrosis 07/15/2016   Rotator cuff tendinitis, left 06/21/2016   Status post total knee replacement using cement, 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   Rotator cuff tendinitis, right 11/27/2015   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   Injury of tendon of long head of right biceps 11/27/2015   Incomplete tear of right rotator cuff 11/27/2015   Obstructive sleep apnea on CPAP 07/13/2015   Osteoarthritis 11/04/2013   Disorder of  uterus 04/29/1995    PCP: Dr. Lavenia Beaver    REFERRING PROVIDER: Dr.Bilal Lateef    REFERRING DIAG:  M54.16 (ICD-10-CM) - Lumbar radiculopathy M48.061 (ICD-10-CM) - Lumbar foraminal stenosis M48.062 (ICD-10-CM) - Spinal stenosis, lumbar region, with neurogenic claudication G89.4 (ICD-10-CM) - Chronic pain syndrome  Rationale for Evaluation and Treatment: Rehabilitation  THERAPY DIAG:  Other low back pain  Chronic left-sided lumbar radiculopathy  ONSET DATE: June  2025   SUBJECTIVE:                                                                                                                                                                                           SUBJECTIVE STATEMENT:  Pt reports that she is feeling better since having surgery. She is still having left leg soreness.   PERTINENT HISTORY:   Dr. Penne Smith's Note on 05/19/24           Postoperative care following lumbar spine surgery for severe lumbar stenosis causing neurogenic claudication and radiculopathy Postoperative recovery is progressing well. Incision site is healing appropriately with some residual glue, which is normal and will flake off naturally. Reports significant improvement in leg pain and heaviness, though soreness persists, which is expected. No new neurological deficits reported. Driving is permissible as long as she is not on new postoperative pain medications. - Continue physical therapy as scheduled until February. - Advised to inform physical therapy if sessions are too early and adjust schedule accordingly. - Scheduled follow-up appointment around the new year or shortly after.    Per Dr. Charolotte note on 02/10/24  Patient states that she is having increased lumbar radicular pain that is making it very challenging for her to walk. She is currently on hydrocodone  5 mg 3 times daily as needed as well as gabapentin  300 mg 3 times a day. I recommend a Medrol  Dosepak as below. She has  an appointment already scheduled for September 11 at which point we will reevaluate. Future considerations could include bilateral L4 transforaminal ESI versus referral to neurosurgery for evaluation for surgical decompression at L4-L5.     PAIN:  Are you having pain? Yes: NPRS scale: 5-6/10  Pain location: Left Buttocks down posterior thigh into anterior ankle    Pain description: Throbbing    Aggravating factors: Bending forward to pick up something    Relieving factors: Pain medication helps    PRECAUTIONS: None  RED FLAGS: None   WEIGHT BEARING RESTRICTIONS: No  FALLS:  Has patient fallen in last 6 months? No  LIVING ENVIRONMENT: Lives with: lives alone Lives in: House/apartment Stairs: No Has following equipment at home: Single point cane and Environmental Consultant - 4 wheeled  OCCUPATION: Retired    PLOF: Independent  PATIENT GOALS: Pt wants to feel less pain so she can return to shopping and cooking without being limited by pain.    NEXT MD VISIT: Did not ask    OBJECTIVE:  Note: Objective measures were completed at Evaluation unless otherwise noted.  VITALS BP  137/66 HR 91 SpO2  100%  DIAGNOSTIC FINDINGS:  CLINICAL DATA:  Left lower extremity radiculopathy.   EXAM: MRI LUMBAR SPINE WITHOUT CONTRAST   TECHNIQUE: Multiplanar, multisequence MR imaging of the lumbar spine was performed. No intravenous contrast was administered.   COMPARISON:  MRI of the lumbar spine dated March 18, 2017.   FINDINGS: Segmentation:  5 non rib-bearing lumbar type vertebrae.   Alignment: Grade 1 anterolisthesis at L4-5 and slight retrolisthesis at L2-3 and L3-4.   Vertebrae: Chronic moderate to severe compression deformity of L2 status post augmentation. There is persistent retropulsion of the posterior wall causing moderate central spinal canal stenosis, as before. Mild interval worsening of degenerative anterolisthesis at L4-5. No osseous lesions.   Conus medullaris and cauda  equina: Conus extends to the L1-2 level. Conus and cauda equina appear normal.   Paraspinal and other soft tissues: There are bilateral parapelvic renal cysts present. No follow-up is necessary. The paraspinous soft tissues are otherwise unremarkable.   Disc levels:   T12-L1: Normal.   L1-2: Mild disc bulging and retropulsion of the posterosuperior corner of L2, resulting in moderate central spinal canal stenosis. The neural foramina are unremarkable. There is no nerve root impingement.   L2-3: Broad-based disc bulging and bilateral facet hypertrophy, causing moderate central spinal canal stenosis and moderate bilateral lateral recess stenosis. No definite nerve root impingement.   L3-4: Broad-based disc bulging and bilateral facet hypertrophy, with moderate central spinal canal stenosis and mild-to-moderate bilateral lateral recess stenosis. No apparent nerve root impingement.   L4-5: Grade 1 anterolisthesis with marked bilateral facet arthrosis, causing severe central spinal canal stenosis and bilateral lateral recess stenosis, with impingement of the L5 nerves in the lateral recesses bilaterally. There is also moderate bilateral neural foraminal stenosis.   L5-S1: Mild disc bulging and bilateral facet hypertrophy, with mild central spinal canal stenosis and mild bilateral neural foraminal stenosis.   IMPRESSION: 1. Interval worsening of degenerative anterolisthesis at L4-5, now with severe central spinal canal stenosis, bilateral lateral recess stenosis and impingement of the L5 nerves bilaterally. 2. Chronic moderate to severe compression deformity of L2 status post augmentation. Persistent moderate central spinal canal stenosis.     Electronically Signed   By: Evalene Coho M.D.   On: 01/14/2024 09:57  PATIENT SURVEYS:  Modified Oswestry:  MODIFIED OSWESTRY DISABILITY SCALE  Date: 04/01/24 Score  Pain intensity 4 =  Pain medication provides me with little  relief from pain.  2. Personal care (washing, dressing, etc.) 3 =  I need help, but I am able to manage most of my personal care.  3. Lifting 5 =  I cannot lift or carry  anything at all.  4. Walking 4 = I can only walk with crutches or a cane.  5. Sitting 2 =  Pain prevents me from sitting more than 1 hour.  6. Standing 2 =  Pain prevents me from standing more than 1 hour  7. Sleeping 2 =  Even when I take pain medication, I sleep less than 6 hours  8. Social Life 4 =  Pain has restricted my social life to my home  9. Traveling 5 = My pain prevents all travel except for visits to the physician/therapist or hospital  10. Employment/ Homemaking 4 = Pain prevents me from doing even light duties.  Total 35/50 (70%)   Interpretation of scores: Score Category Description  0-20% Minimal Disability The patient can cope with most living activities. Usually no treatment is indicated apart from advice on lifting, sitting and exercise  21-40% Moderate Disability The patient experiences more pain and difficulty with sitting, lifting and standing. Travel and social life are more difficult and they may be disabled from work. Personal care, sexual activity and sleeping are not grossly affected, and the patient can usually be managed by conservative means  41-60% Severe Disability Pain remains the main problem in this group, but activities of daily living are affected. These patients require a detailed investigation  61-80% Crippled Back pain impinges on all aspects of the patients life. Positive intervention is required  81-100% Bed-bound These patients are either bed-bound or exaggerating their symptoms  Bluford FORBES Zoe DELENA Karon DELENA, et al. Surgery versus conservative management of stable thoracolumbar fracture: the PRESTO feasibility RCT. [place] ([place]): [employer]; 2021 Nov. [facility] [employer] Assessment, No. 25.62.) Appendix 3, Oswestry Disability Index category descriptors. Available from:  Findjewelers.cz  Minimally Clinically Important Difference (MCID) = 12.8%  COGNITION: Overall cognitive status: Within functional limits for tasks assessed     SENSATION: WFL  MUSCLE LENGTH: Hamstrings: Right 90 deg; Left 70 deg* Thomas test: Not performed    POSTURE: rounded shoulders and forward head  PALPATION:   LUMBAR ROM:   AROM eval  Flexion 100%*  Extension 100%  Right lateral flexion 100%  Left lateral flexion 100%  Right rotation 100%  Left rotation 100%   (Blank rows = not tested)  LOWER EXTREMITY ROM:     Active  Right eval Left eval  Hip flexion    Hip extension    Hip abduction    Hip adduction    Hip internal rotation    Hip external rotation    Knee flexion    Knee extension    Ankle dorsiflexion    Ankle plantarflexion    Ankle inversion    Ankle eversion     (Blank rows = not tested)  LOWER EXTREMITY MMT:    MMT Right eval Left eval  Hip flexion 4 4-*  Hip extension    Hip External Rotation in Sitting 4 4  Hip adduction 4 4  Hip internal rotation    Knee flexion 4 4  Knee extension 4 4  Ankle dorsiflexion 4 4  Ankle plantarflexion    Ankle inversion    Ankle eversion     (Blank rows = not tested)  LUMBAR SPECIAL TESTS:  Straight leg raise test: Positive  FUNCTIONAL TESTS:  5 times sit to stand: NT  30 seconds chair stand test Timed up and go (TUG): NT 2 minute walk test: 312 ft 10 meter walk test: NT  GAIT: Distance walked: 50 ft   Assistive  device utilized: Environmental Consultant - 4 wheeled Level of assistance: Modified independence Comments: Decreased step length bilaterally   TREATMENT DATE:   05/28/23 THEREX  310 ft with use of rollator   Single Knee to Chest 2 x 10 with 3 sec hold   Seated HS Stretch 4 x 60 sec    -min VC to place pressure over knee to increase knee extension Standing Hip Extension with BUE support 2 x 10     THERAC   Sit to Stand with 1 UE support 1 x 5 -Pt  reports increased LLE pain  Mini-Squat with BUE support 1 x 10    -mod VC to keep knees behind toes   Wall Squat with BUE support 1 x 10  -mod VC to keep contact with wall and knees behind toes  -NRPS 8/10      PATIENT EDUCATION:  Education details: Form and technique for correct performance of exercise and explanation about deficits resulting from L4 nerve impingement  Person educated: Patient Education method: Explanation, Demonstration, Verbal cues, and Handouts Education comprehension: verbalized understanding, returned demonstration, and verbal cues required  HOME EXERCISE PROGRAM: Access Code: [email or web address] URL: [email or web address] Date: 05/27/2024 Prepared by: Toribio Servant  Exercises - Supine Single Knee to Chest Stretch  - 1 x daily - 7 x weekly - 2 sets - 10 reps - 3 sec  hold - Seated Hamstring Stretch  - 1 x daily - 7 x weekly - 3 reps - 60 sec hold - Sitting to Supine Roll  - 1 x daily - 7 x weekly - 1 sets - 5 reps - Supine Ankle Pumps  - 1 x daily - 7 x weekly - 2 sets - 10 reps - Seated Transversus Abdominis Bracing  - 1 x daily - 7 x weekly - 2 sets - 10 reps - 3 sec  hold - Standing Hip Extension with Counter Support  - 3-4 x weekly - 3 sets - 10 reps  Patient Education - Spinal Precautions   ASSESSMENT:  CLINICAL IMPRESSION: Pt presents nearly 4 weeks s/p left L4-L5 medial facetectomy, lateral recess decompression. Pt shows ongoing progress with hip strengthening with ability to perform exercises in standing.  Her aerobic endurance is still about the same as before surgery and she was not limited by LLE pain. Testing was likely affected by changing format from 10 M to 100 feet loop. However, she did experience an increase in her LLE pain at end of session and this is likely from sit to stands or mini-squats, which will be avoiding in future sessions. She will continue to benefit from skilled PT to reduce her left sided low back pain and improve her  mobility, os that she is not limited by pain.    OBJECTIVE IMPAIRMENTS: Abnormal gait, decreased balance, decreased coordination, decreased knowledge of condition, decreased mobility, difficulty walking, decreased ROM, decreased strength, hypomobility, impaired flexibility, impaired UE functional use, postural dysfunction, obesity, and pain.   ACTIVITY LIMITATIONS: carrying, lifting, bending, standing, squatting, sleeping, stairs, transfers, bed mobility, bathing, toileting, dressing, hygiene/grooming, and locomotion level  PARTICIPATION LIMITATIONS: meal prep, cleaning, laundry, shopping, community activity, and church  PERSONAL FACTORS: Age, Fitness, and 3+ comorbidities: HTN, severe right knee OA, chronic pain syndrome are also affecting patient's functional outcome.   REHAB POTENTIAL: Good  CLINICAL DECISION MAKING: Stable/uncomplicated  EVALUATION COMPLEXITY: Low   GOALS: Goals reviewed with patient? No  SHORT TERM GOALS: Target date: 04/15/2024  Patient will demonstrate undestanding of home exercise plan by  performing exercises correctly with evidence of good carry over with min to no verbal or tactile cues .   Baseline: NT 04/13/24: Performing exercises independently  Goal status: ACHIEVED    2.  Patient will demonstrate understanding of how to use TENS unit to relieve left sided radiating low back pain with movement by independently placing pads on her low back in box pattern and turning on unit to proper setting.  Baseline: NT  04/13/24: Pt educated on use of TENS with use of handout  Goal status: ACHIEVED     LONG TERM GOALS: Target date: 06/24/2024  Patient will improve modified Oswestry Disability Index (MODI) score by decreasing initial score by >=13% as evidence of the minimal statistically significant change for improvement with low back pain disability and improvement in low back function (Copay et al, 2008) Baseline: 70% (35/50) Goal status: ONGOING   2.  Patient  will be able to perform supine<>transfer with supervision as evidence of improved mobility and core and hip strength to remain independent.  Baseline: min A to help transfer from side lying to sit 05/25/24: Supervision with sit<>supine   Goal status: ACHIEVED    3.  Patient will perform TUG test in <12 sec as evidence of improved LLE function and mobility to decrease risk of falling and to continue to remain independent.   Baseline: 13.70 sec  05/25/24: 14 sec  with use of 2WW  Goal status: ONGOING   4. Pt will increase by at least 12.2 m (40 ft) in order to demonstrate clinically significant improvement in cardiopulmonary endurance and community ambulation. Normie & Debby, 2009). Baseline: 312 feet (9.5 x 10 M walk with rollator) 05/27/24: 310 feet with rollator with loop   Goal status: ONGOING   5.  Patient will be able to perform sit to stand without use of UE support from 18 inch mat height as evidence of improved LE strength.   Baseline: Needs use of BUE support to stand up from a seated position   05/25/24: Needs to use 1 UE support and unable to do without UE support   Goal status: ONGOING    PLAN:  PT FREQUENCY: 1-2x/week  PT DURATION: 12 weeks  PLANNED INTERVENTIONS: 97164- PT Re-evaluation, 97750- Physical Performance Testing, 97110-Therapeutic exercises, 97530- Therapeutic activity, 97112- Neuromuscular re-education, 97535- Self Care, 02859- Manual therapy, [id number]- Gait training, [phone]- Orthotic Initial, [phone]- Aquatic Therapy, [phone]- Electrical stimulation (unattended), [phone]- Electrical stimulation (manual), [id number]- Traction (mechanical), 20560 (1-2 muscles), 20561 (3+ muscles)- Dry Needling, Patient/Family education, Balance training, Stair training, Taping, Joint mobilization, Joint manipulation, Spinal manipulation, Spinal mobilization, Vestibular training, DME instructions, Cryotherapy, and Moist heat.  PLAN FOR NEXT SESSION: Modify hip extension with forward lean on mat  or counter. Review long rolls again to see if patient is able to sequence movement better. Continue to follow [place] lumbar laminectomy protocol: Bent knee fallouts with resistance with progression to side lying clam shells. Use TENS if needed for better pain management PADS in red folder in 2nd drawer in desk.   Toribio Servant PT, DPT  [facility] [address], [place], [address] Phone: [phone]   Fax:  [phone]

## 2024-06-01 ENCOUNTER — Encounter: Payer: Self-pay | Admitting: Physical Therapy

## 2024-06-01 ENCOUNTER — Ambulatory Visit

## 2024-06-01 DIAGNOSIS — M5416 Radiculopathy, lumbar region: Secondary | ICD-10-CM

## 2024-06-01 DIAGNOSIS — M5459 Other low back pain: Secondary | ICD-10-CM

## 2024-06-01 NOTE — Therapy (Signed)
 OUTPATIENT PHYSICAL THERAPY THORACOLUMBAR TREATMENT  Patient Name: Erika Crawford MRN: [id number] DOB:May 17, 1941, 83 y.o., female Today's Date: 06/01/2024  END OF SESSION:  PT End of Session - 06/01/24 1423     Visit Number 6    Number of Visits 24    Date for Recertification  06/24/24    Authorization Type UHC Dual Medicare    Progress Note Due on Visit 10    PT Start Time 1428    PT Stop Time 1510    PT Time Calculation (min) 42 min    Equipment Utilized During Treatment Gait belt    Activity Tolerance Patient tolerated treatment well    Behavior During Therapy WFL for tasks assessed/performed             Past Medical History:  Diagnosis Date   Anginal pain    Arthritis    Complication of anesthesia    dizziness   GERD (gastroesophageal reflux disease)    Hypertension    Hypothyroidism    Lumbar foraminal stenosis    Lumbar radiculopathy    PAC (premature atrial contraction)    PVC (premature ventricular contraction)    Sleep apnea    cpap   Spinal stenosis    Left-sided L4-L5 lumbar   Thyroid disease    Past Surgical History:  Procedure Laterality Date   ABDOMINAL HYSTERECTOMY     BACK SURGERY     lower middle kyphoplasty   CHOLECYSTECTOMY     COLONOSCOPY WITH PROPOFOL  N/A 09/21/2015   Procedure: COLONOSCOPY WITH PROPOFOL ;  Surgeon: Deward CINDERELLA Piedmont, MD;  Location: [facility];  Service: Gastroenterology;  Laterality: N/A;   COLONOSCOPY WITH PROPOFOL  N/A 04/24/2020   Procedure: COLONOSCOPY WITH PROPOFOL ;  Surgeon: Maryruth Ole DASEN, MD;  Location: [facility];  Service: Endoscopy;  Laterality: N/A;   DECOMPRESSIVE LUMBAR LAMINECTOMY LEVEL 1 Left 05/06/2024   Procedure: Left Sided Approach to Minimally Invasive Laminectomy, medial facetectomy, and lateral recess decompression;  Surgeon: Claudene Penne ORN, MD;  Location: [facility];  Service: Neurosurgery;  Laterality: Left;  Left Sided Approach to L4-5 Minimally Invasive Laminectomy, medial facetectomy,  and lateral recess decompression   JOINT REPLACEMENT     03/19/2016- left knee replacement   REVERSE SHOULDER ARTHROPLASTY Right 08/25/2018   Procedure: REVERSE SHOULDER ARTHROPLASTY, RIGHT;  Surgeon: Edie Norleen PARAS, MD;  Location: [facility];  Service: Orthopedics;  Laterality: Right;   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   Procedure: TOTAL KNEE ARTHROPLASTY;  Surgeon: Norleen PARAS Edie, MD;  Location: [facility];  Service: Orthopedics;  Laterality: Left;   Patient Active Problem List   Diagnosis Date Noted   Status post lumbar laminectomy 05/19/2024   Weakness of left lower extremity 04/23/2024   Lumbar foraminal stenosis 04/23/2023   Chronic pain syndrome 04/23/2023   Status post reverse total shoulder replacement, right 08/25/2018   Chronic left-sided low back pain with left-sided sciatica 06/22/2018   Vitamin B 12 deficiency 01/02/2018   Chronic anemia 09/06/2017   Chronic constipation 06/08/2017   Lumbar radiculopathy 05/22/2017   Spinal stenosis, lumbar region, with neurogenic claudication 03/19/2017   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 03/10/2017   Acute bilateral low back pain without sciatica 02/04/2017   Dysuria 12/13/2016   Prediabetes 11/05/2016   Acquired hypothyroidism 08/24/2016   Myofascial pain syndrome 08/24/2016   Essential hypertension 08/24/2016   Osteopenia of multiple sites 08/24/2016   Lumbar degenerative disc disease 08/24/2016   Bilateral hydronephrosis 07/15/2016   Rotator cuff tendinitis, left 06/21/2016  Status post total knee replacement using cement, 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   Rotator cuff tendinitis, right 11/27/2015   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   Injury of tendon of long head of right biceps 11/27/2015   Incomplete tear of right rotator cuff 11/27/2015   Obstructive sleep apnea on CPAP 07/13/2015   Osteoarthritis 11/04/2013   Disorder of uterus 04/29/1995    PCP: Dr. Lavenia Beaver    REFERRING PROVIDER:  Dr.Bilal Lateef    REFERRING DIAG:  M54.16 (ICD-10-CM) - Lumbar radiculopathy M48.061 (ICD-10-CM) - Lumbar foraminal stenosis M48.062 (ICD-10-CM) - Spinal stenosis, lumbar region, with neurogenic claudication G89.4 (ICD-10-CM) - Chronic pain syndrome  Rationale for Evaluation and Treatment: Rehabilitation  THERAPY DIAG:  Other low back pain  Chronic left-sided lumbar radiculopathy  ONSET DATE: June  2025   SUBJECTIVE:                                                                                                                                                                                           SUBJECTIVE STATEMENT:  Pt reports LLE soreness but no pain. Happy with progress in PT and from her back surgery.  PERTINENT HISTORY:   Dr. Penne Smith's Note on 05/19/24           Postoperative care following lumbar spine surgery for severe lumbar stenosis causing neurogenic claudication and radiculopathy Postoperative recovery is progressing well. Incision site is healing appropriately with some residual glue, which is normal and will flake off naturally. Reports significant improvement in leg pain and heaviness, though soreness persists, which is expected. No new neurological deficits reported. Driving is permissible as long as she is not on new postoperative pain medications. - Continue physical therapy as scheduled until February. - Advised to inform physical therapy if sessions are too early and adjust schedule accordingly. - Scheduled follow-up appointment around the new year or shortly after.    Per Dr. Charolotte note on 02/10/24  Patient states that she is having increased lumbar radicular pain that is making it very challenging for her to walk. She is currently on hydrocodone  5 mg 3 times daily as needed as well as gabapentin  300 mg 3 times a day. I recommend a Medrol  Dosepak as below. She has an appointment already scheduled for September 11 at which point we will reevaluate.  Future considerations could include bilateral L4 transforaminal ESI versus referral to neurosurgery for evaluation for surgical decompression at L4-L5.     PAIN:  Are you having pain? Yes: NPRS scale: 5-6/10   Pain location: Left Buttocks down posterior thigh into anterior ankle    Pain description: Throbbing  Aggravating factors: Bending forward to pick up something    Relieving factors: Pain medication helps    PRECAUTIONS: None  RED FLAGS: None   WEIGHT BEARING RESTRICTIONS: No  FALLS:  Has patient fallen in last 6 months? No  LIVING ENVIRONMENT: Lives with: lives alone Lives in: House/apartment Stairs: No Has following equipment at home: Single point cane and Environmental Consultant - 4 wheeled  OCCUPATION: Retired    PLOF: Independent  PATIENT GOALS: Pt wants to feel less pain so she can return to shopping and cooking without being limited by pain.    NEXT MD VISIT: Did not ask    OBJECTIVE:  Note: Objective measures were completed at Evaluation unless otherwise noted.  VITALS BP  137/66 HR 91 SpO2  100%  DIAGNOSTIC FINDINGS:  CLINICAL DATA:  Left lower extremity radiculopathy.   EXAM: MRI LUMBAR SPINE WITHOUT CONTRAST   TECHNIQUE: Multiplanar, multisequence MR imaging of the lumbar spine was performed. No intravenous contrast was administered.   COMPARISON:  MRI of the lumbar spine dated March 18, 2017.   FINDINGS: Segmentation:  5 non rib-bearing lumbar type vertebrae.   Alignment: Grade 1 anterolisthesis at L4-5 and slight retrolisthesis at L2-3 and L3-4.   Vertebrae: Chronic moderate to severe compression deformity of L2 status post augmentation. There is persistent retropulsion of the posterior wall causing moderate central spinal canal stenosis, as before. Mild interval worsening of degenerative anterolisthesis at L4-5. No osseous lesions.   Conus medullaris and cauda equina: Conus extends to the L1-2 level. Conus and cauda equina appear normal.    Paraspinal and other soft tissues: There are bilateral parapelvic renal cysts present. No follow-up is necessary. The paraspinous soft tissues are otherwise unremarkable.   Disc levels:   T12-L1: Normal.   L1-2: Mild disc bulging and retropulsion of the posterosuperior corner of L2, resulting in moderate central spinal canal stenosis. The neural foramina are unremarkable. There is no nerve root impingement.   L2-3: Broad-based disc bulging and bilateral facet hypertrophy, causing moderate central spinal canal stenosis and moderate bilateral lateral recess stenosis. No definite nerve root impingement.   L3-4: Broad-based disc bulging and bilateral facet hypertrophy, with moderate central spinal canal stenosis and mild-to-moderate bilateral lateral recess stenosis. No apparent nerve root impingement.   L4-5: Grade 1 anterolisthesis with marked bilateral facet arthrosis, causing severe central spinal canal stenosis and bilateral lateral recess stenosis, with impingement of the L5 nerves in the lateral recesses bilaterally. There is also moderate bilateral neural foraminal stenosis.   L5-S1: Mild disc bulging and bilateral facet hypertrophy, with mild central spinal canal stenosis and mild bilateral neural foraminal stenosis.   IMPRESSION: 1. Interval worsening of degenerative anterolisthesis at L4-5, now with severe central spinal canal stenosis, bilateral lateral recess stenosis and impingement of the L5 nerves bilaterally. 2. Chronic moderate to severe compression deformity of L2 status post augmentation. Persistent moderate central spinal canal stenosis.     Electronically Signed   By: Evalene Coho M.D.   On: 01/14/2024 09:57  PATIENT SURVEYS:  Modified Oswestry:  MODIFIED OSWESTRY DISABILITY SCALE  Date: 04/01/24 Score  Pain intensity 4 =  Pain medication provides me with little relief from pain.  2. Personal care (washing, dressing, etc.) 3 =  I need help,  but I am able to manage most of my personal care.  3. Lifting 5 =  I cannot lift or carry anything at all.  4. Walking 4 = I can only walk with crutches or a cane.  5.  Sitting 2 =  Pain prevents me from sitting more than 1 hour.  6. Standing 2 =  Pain prevents me from standing more than 1 hour  7. Sleeping 2 =  Even when I take pain medication, I sleep less than 6 hours  8. Social Life 4 =  Pain has restricted my social life to my home  9. Traveling 5 = My pain prevents all travel except for visits to the physician/therapist or hospital  10. Employment/ Homemaking 4 = Pain prevents me from doing even light duties.  Total 35/50 (70%)   Interpretation of scores: Score Category Description  0-20% Minimal Disability The patient can cope with most living activities. Usually no treatment is indicated apart from advice on lifting, sitting and exercise  21-40% Moderate Disability The patient experiences more pain and difficulty with sitting, lifting and standing. Travel and social life are more difficult and they may be disabled from work. Personal care, sexual activity and sleeping are not grossly affected, and the patient can usually be managed by conservative means  41-60% Severe Disability Pain remains the main problem in this group, but activities of daily living are affected. These patients require a detailed investigation  61-80% Crippled Back pain impinges on all aspects of the patients life. Positive intervention is required  81-100% Bed-bound These patients are either bed-bound or exaggerating their symptoms  Bluford FORBES Zoe DELENA Karon DELENA, et al. Surgery versus conservative management of stable thoracolumbar fracture: the PRESTO feasibility RCT. [place]): [employer]; 2021 Nov. [facility] Technology Assessment, No. 25.62.) Appendix 3, Oswestry Disability Index category descriptors. Available from: Findjewelers.cz  Minimally Clinically Important  Difference (MCID) = 12.8%  COGNITION: Overall cognitive status: Within functional limits for tasks assessed     SENSATION: WFL  MUSCLE LENGTH: Hamstrings: Right 90 deg; Left 70 deg* Thomas test: Not performed    POSTURE: rounded shoulders and forward head  PALPATION:   LUMBAR ROM:   AROM eval  Flexion 100%*  Extension 100%  Right lateral flexion 100%  Left lateral flexion 100%  Right rotation 100%  Left rotation 100%   (Blank rows = not tested)  LOWER EXTREMITY ROM:     Active  Right eval Left eval  Hip flexion    Hip extension    Hip abduction    Hip adduction    Hip internal rotation    Hip external rotation    Knee flexion    Knee extension    Ankle dorsiflexion    Ankle plantarflexion    Ankle inversion    Ankle eversion     (Blank rows = not tested)  LOWER EXTREMITY MMT:    MMT Right eval Left eval  Hip flexion 4 4-*  Hip extension    Hip External Rotation in Sitting 4 4  Hip adduction 4 4  Hip internal rotation    Knee flexion 4 4  Knee extension 4 4  Ankle dorsiflexion 4 4  Ankle plantarflexion    Ankle inversion    Ankle eversion     (Blank rows = not tested)  LUMBAR SPECIAL TESTS:  Straight leg raise test: Positive  FUNCTIONAL TESTS:  5 times sit to stand: NT  30 seconds chair stand test Timed up and go (TUG): NT 2 minute walk test: 312 ft 10 meter walk test: NT  GAIT: Distance walked: 50 ft   Assistive device utilized: Walker - 4 wheeled Level of assistance: Modified independence Comments: Decreased step length bilaterally   TREATMENT  DATE:   06/01/24 There.Act:  Gait in clinic 4WW: 400' supervision. Consistent step through pattern. 4-5/10 on BORG RPE   STS with airex pad in seat: 3x5  Log roll R/L on mat table. Min step by step sequencing cues but follows with excellent carryover.   There.Ex: Hook lying core exercises:   Alternating LE fall outs: 2x10/side   Alternating marches: 2x10/side   Clam shells: 2x6/side  with RTB   Standing hip extension: 2x10/side   Seated scap retraction with blue TB: 3x12. VC's for neutral spine positioning and being unsupported.   PATIENT EDUCATION:  Education details: Form and technique for correct performance of exercise and explanation about deficits resulting from L4 nerve impingement  Person educated: Patient Education method: Explanation, Demonstration, Verbal cues, and Handouts Education comprehension: verbalized understanding, returned demonstration, and verbal cues required  HOME EXERCISE PROGRAM: Access Code: [email or web address] URL: [email or web address] Date: 05/27/2024 Prepared by: Toribio Servant  Exercises - Supine Single Knee to Chest Stretch  - 1 x daily - 7 x weekly - 2 sets - 10 reps - 3 sec  hold - Seated Hamstring Stretch  - 1 x daily - 7 x weekly - 3 reps - 60 sec hold - Sitting to Supine Roll  - 1 x daily - 7 x weekly - 1 sets - 5 reps - Supine Ankle Pumps  - 1 x daily - 7 x weekly - 2 sets - 10 reps - Seated Transversus Abdominis Bracing  - 1 x daily - 7 x weekly - 2 sets - 10 reps - 3 sec  hold - Standing Hip Extension with Counter Support  - 3-4 x weekly - 3 sets - 10 reps  Patient Education - Spinal Precautions   ASSESSMENT:  CLINICAL IMPRESSION:  Continuing PT POC working on core stability and LE strength program. Pt presents with excellent motivation completing additional exercises without LBP or LLE pain. Continuing to review log roll techniques to protect lumbar site to ensure tissue healing. Pt able to demo safe completion post education and training with core stability exercises in hook lying. She will continue to benefit from skilled PT to reduce her left sided low back pain and improve her mobility, os that she is not limited by pain.   OBJECTIVE IMPAIRMENTS: Abnormal gait, decreased balance, decreased coordination, decreased knowledge of condition, decreased mobility, difficulty walking, decreased ROM, decreased strength,  hypomobility, impaired flexibility, impaired UE functional use, postural dysfunction, obesity, and pain.   ACTIVITY LIMITATIONS: carrying, lifting, bending, standing, squatting, sleeping, stairs, transfers, bed mobility, bathing, toileting, dressing, hygiene/grooming, and locomotion level  PARTICIPATION LIMITATIONS: meal prep, cleaning, laundry, shopping, community activity, and church  PERSONAL FACTORS: Age, Fitness, and 3+ comorbidities: HTN, severe right knee OA, chronic pain syndrome are also affecting patient's functional outcome.   REHAB POTENTIAL: Good  CLINICAL DECISION MAKING: Stable/uncomplicated  EVALUATION COMPLEXITY: Low   GOALS: Goals reviewed with patient? No  SHORT TERM GOALS: Target date: 04/15/2024  Patient will demonstrate undestanding of home exercise plan by performing exercises correctly with evidence of good carry over with min to no verbal or tactile cues .   Baseline: NT 04/13/24: Performing exercises independently  Goal status: ACHIEVED    2.  Patient will demonstrate understanding of how to use TENS unit to relieve left sided radiating low back pain with movement by independently placing pads on her low back in box pattern and turning on unit to proper setting.  Baseline: NT  04/13/24: Pt educated on use of TENS with use  of handout  Goal status: ACHIEVED     LONG TERM GOALS: Target date: 06/24/2024  Patient will improve modified Oswestry Disability Index (MODI) score by decreasing initial score by >=13% as evidence of the minimal statistically significant change for improvement with low back pain disability and improvement in low back function (Copay et al, 2008) Baseline: 70% (35/50) Goal status: ONGOING   2.  Patient will be able to perform supine<>transfer with supervision as evidence of improved mobility and core and hip strength to remain independent.  Baseline: min A to help transfer from side lying to sit 05/25/24: Supervision with sit<>supine    Goal status: ACHIEVED    3.  Patient will perform TUG test in <12 sec as evidence of improved LLE function and mobility to decrease risk of falling and to continue to remain independent.   Baseline: 13.70 sec  05/25/24: 14 sec  with use of 2WW  Goal status: ONGOING   4. Pt will increase by at least 12.2 m (40 ft) in order to demonstrate clinically significant improvement in cardiopulmonary endurance and community ambulation. Normie & Debby, 2009). Baseline: 312 feet (9.5 x 10 M walk with rollator) 05/27/24: 310 feet with rollator with loop   Goal status: ONGOING   5.  Patient will be able to perform sit to stand without use of UE support from 18 inch mat height as evidence of improved LE strength.   Baseline: Needs use of BUE support to stand up from a seated position   05/25/24: Needs to use 1 UE support and unable to do without UE support   Goal status: ONGOING    PLAN:  PT FREQUENCY: 1-2x/week  PT DURATION: 12 weeks  PLANNED INTERVENTIONS: 97164- PT Re-evaluation, 97750- Physical Performance Testing, 97110-Therapeutic exercises, 97530- Therapeutic activity, 97112- Neuromuscular re-education, 97535- Self Care, 02859- Manual therapy, [id number]- Gait training, [phone]- Orthotic Initial, [phone]- Aquatic Therapy, [phone]- Electrical stimulation (unattended), [phone]- Electrical stimulation (manual), [id number]- Traction (mechanical), 20560 (1-2 muscles), 20561 (3+ muscles)- Dry Needling, Patient/Family education, Balance training, Stair training, Taping, Joint mobilization, Joint manipulation, Spinal manipulation, Spinal mobilization, Vestibular training, DME instructions, Cryotherapy, and Moist heat.  PLAN FOR NEXT SESSION: Modify hip extension with forward lean on mat or counter. Review long rolls again to see if patient is able to sequence movement better. Continue to follow [place]  [facility] lumbar laminectomy protocol: Bent knee fallouts with resistance with progression to side lying clam shells. Use  TENS if needed for better pain management PADS in red folder in 2nd drawer in desk.   Dorina HERO. Fairly IV, PT, DPT Physical Therapist- [place]  [facility]

## 2024-06-03 ENCOUNTER — Ambulatory Visit: Admitting: Physical Therapy

## 2024-06-08 ENCOUNTER — Ambulatory Visit: Admitting: Physical Therapy

## 2024-06-08 DIAGNOSIS — M5459 Other low back pain: Secondary | ICD-10-CM

## 2024-06-08 DIAGNOSIS — M5416 Radiculopathy, lumbar region: Secondary | ICD-10-CM

## 2024-06-08 NOTE — Therapy (Signed)
 " OUTPATIENT PHYSICAL THERAPY THORACOLUMBAR TREATMENT  Patient Name: Erika Crawford MRN: [id number] DOB:1941/01/30, 83 y.o., female Today's Date: 06/08/2024  END OF SESSION:  PT End of Session - 06/08/24 1438     Visit Number 7    Number of Visits 24    Date for Recertification  06/24/24    Authorization Type [employer] Dual Medicare    Authorization - Visit Number 7    Authorization - Number of Visits 24    Progress Note Due on Visit 10    PT Start Time 1430    PT Stop Time 1515    PT Time Calculation (min) 45 min    Equipment Utilized During Treatment Gait belt    Activity Tolerance Patient tolerated treatment well    Behavior During Therapy WFL for tasks assessed/performed              Past Medical History:  Diagnosis Date   Anginal pain    Arthritis    Complication of anesthesia    dizziness   GERD (gastroesophageal reflux disease)    Hypertension    Hypothyroidism    Lumbar foraminal stenosis    Lumbar radiculopathy    PAC (premature atrial contraction)    PVC (premature ventricular contraction)    Sleep apnea    cpap   Spinal stenosis    Left-sided L4-L5 lumbar   Thyroid disease    Past Surgical History:  Procedure Laterality Date   ABDOMINAL HYSTERECTOMY     BACK SURGERY     lower middle kyphoplasty   CHOLECYSTECTOMY     COLONOSCOPY WITH PROPOFOL  N/A 09/21/2015   Procedure: COLONOSCOPY WITH PROPOFOL ;  Surgeon: Deward CINDERELLA Piedmont, MD;  Location: [facility];  Service: Gastroenterology;  Laterality: N/A;   COLONOSCOPY WITH PROPOFOL  N/A 04/24/2020   Procedure: COLONOSCOPY WITH PROPOFOL ;  Surgeon: Maryruth Ole DASEN, MD;  Location: [facility];  Service: Endoscopy;  Laterality: N/A;   DECOMPRESSIVE LUMBAR LAMINECTOMY LEVEL 1 Left 05/06/2024   Procedure: Left Sided Approach to Minimally Invasive Laminectomy, medial facetectomy, and lateral recess decompression;  Surgeon: Claudene Penne ORN, MD;  Location: [facility];  Service: Neurosurgery;  Laterality: Left;  Left  Sided Approach to L4-5 Minimally Invasive Laminectomy, medial facetectomy, and lateral recess decompression   JOINT REPLACEMENT     03/19/2016- left knee replacement   REVERSE SHOULDER ARTHROPLASTY Right 08/25/2018   Procedure: REVERSE SHOULDER ARTHROPLASTY, RIGHT;  Surgeon: Edie Norleen PARAS, MD;  Location: [facility];  Service: Orthopedics;  Laterality: Right;   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   Procedure: TOTAL KNEE ARTHROPLASTY;  Surgeon: Norleen PARAS Edie, MD;  Location: [facility];  Service: Orthopedics;  Laterality: Left;   Patient Active Problem List   Diagnosis Date Noted   Status post lumbar laminectomy 05/19/2024   Weakness of left lower extremity 04/23/2024   Lumbar foraminal stenosis 04/23/2023   Chronic pain syndrome 04/23/2023   Status post reverse total shoulder replacement, right 08/25/2018   Chronic left-sided low back pain with left-sided sciatica 06/22/2018   Vitamin B 12 deficiency 01/02/2018   Chronic anemia 09/06/2017   Chronic constipation 06/08/2017   Lumbar radiculopathy 05/22/2017   Spinal stenosis, lumbar region, with neurogenic claudication 03/19/2017   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 03/10/2017   Acute bilateral low back pain without sciatica 02/04/2017   Dysuria 12/13/2016   Prediabetes 11/05/2016   Acquired hypothyroidism 08/24/2016   Myofascial pain syndrome 08/24/2016   Essential hypertension 08/24/2016   Osteopenia of multiple sites 08/24/2016  Lumbar degenerative disc disease 08/24/2016   Bilateral hydronephrosis 07/15/2016   Rotator cuff tendinitis, left 06/21/2016   Status post total knee replacement using cement, 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   Rotator cuff tendinitis, right 11/27/2015   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   Injury of tendon of long head of right biceps 11/27/2015   Incomplete tear of right rotator cuff 11/27/2015   Obstructive sleep apnea on CPAP 07/13/2015   Osteoarthritis 11/04/2013   Disorder of uterus  04/29/1995    PCP: Dr. Lavenia Beaver    REFERRING PROVIDER: Dr.Bilal Lateef    REFERRING DIAG:  M54.16 (ICD-10-CM) - Lumbar radiculopathy M48.061 (ICD-10-CM) - Lumbar foraminal stenosis M48.062 (ICD-10-CM) - Spinal stenosis, lumbar region, with neurogenic claudication G89.4 (ICD-10-CM) - Chronic pain syndrome  Rationale for Evaluation and Treatment: Rehabilitation  THERAPY DIAG:  Other low back pain  Chronic left-sided lumbar radiculopathy  ONSET DATE: June  2025   SUBJECTIVE:                                                                                                                                                                                           SUBJECTIVE STATEMENT:  Pt reports that she is feeling soreness all over her body especially with her right knee and left leg. She believes this is arthritis but she is going to see her PCP about what me be going on. She had lumbar laminectomy surgery on Nov 20th and she is now 5 weeks post op.   PERTINENT HISTORY:   Dr. Penne Smith's Note on 05/19/24           Postoperative care following lumbar spine surgery for severe lumbar stenosis causing neurogenic claudication and radiculopathy Postoperative recovery is progressing well. Incision site is healing appropriately with some residual glue, which is normal and will flake off naturally. Reports significant improvement in leg pain and heaviness, though soreness persists, which is expected. No new neurological deficits reported. Driving is permissible as long as she is not on new postoperative pain medications. - Continue physical therapy as scheduled until February. - Advised to inform physical therapy if sessions are too early and adjust schedule accordingly. - Scheduled follow-up appointment around the new year or shortly after.    Per Dr. Charolotte note on 02/10/24  Patient states that she is having increased lumbar radicular pain that is making it very challenging for  her to walk. She is currently on hydrocodone  5 mg 3 times daily as needed as well as gabapentin  300 mg 3 times a day. I recommend a Medrol  Dosepak as below. She has an appointment already scheduled for September 11 at which point  we will reevaluate. Future considerations could include bilateral L4 transforaminal ESI versus referral to neurosurgery for evaluation for surgical decompression at L4-L5.     PAIN:  Are you having pain? Yes: NPRS scale: 7-8/10   Pain location: Left Buttocks down posterior thigh into anterior ankle    Pain description: Throbbing    Aggravating factors: Bending forward to pick up something    Relieving factors: Pain medication helps    PRECAUTIONS: None  RED FLAGS: None   WEIGHT BEARING RESTRICTIONS: No  FALLS:  Has patient fallen in last 6 months? No  LIVING ENVIRONMENT: Lives with: lives alone Lives in: House/apartment Stairs: No Has following equipment at home: Single point cane and Environmental Consultant - 4 wheeled  OCCUPATION: Retired    PLOF: Independent  PATIENT GOALS: Pt wants to feel less pain so she can return to shopping and cooking without being limited by pain.    NEXT MD VISIT: Did not ask    OBJECTIVE:  Note: Objective measures were completed at Evaluation unless otherwise noted.  VITALS BP  137/66 HR 91 SpO2  100%  DIAGNOSTIC FINDINGS:  CLINICAL DATA:  Left lower extremity radiculopathy.   EXAM: MRI LUMBAR SPINE WITHOUT CONTRAST   TECHNIQUE: Multiplanar, multisequence MR imaging of the lumbar spine was performed. No intravenous contrast was administered.   COMPARISON:  MRI of the lumbar spine dated March 18, 2017.   FINDINGS: Segmentation:  5 non rib-bearing lumbar type vertebrae.   Alignment: Grade 1 anterolisthesis at L4-5 and slight retrolisthesis at L2-3 and L3-4.   Vertebrae: Chronic moderate to severe compression deformity of L2 status post augmentation. There is persistent retropulsion of the posterior wall causing moderate  central spinal canal stenosis, as before. Mild interval worsening of degenerative anterolisthesis at L4-5. No osseous lesions.   Conus medullaris and cauda equina: Conus extends to the L1-2 level. Conus and cauda equina appear normal.   Paraspinal and other soft tissues: There are bilateral parapelvic renal cysts present. No follow-up is necessary. The paraspinous soft tissues are otherwise unremarkable.   Disc levels:   T12-L1: Normal.   L1-2: Mild disc bulging and retropulsion of the posterosuperior corner of L2, resulting in moderate central spinal canal stenosis. The neural foramina are unremarkable. There is no nerve root impingement.   L2-3: Broad-based disc bulging and bilateral facet hypertrophy, causing moderate central spinal canal stenosis and moderate bilateral lateral recess stenosis. No definite nerve root impingement.   L3-4: Broad-based disc bulging and bilateral facet hypertrophy, with moderate central spinal canal stenosis and mild-to-moderate bilateral lateral recess stenosis. No apparent nerve root impingement.   L4-5: Grade 1 anterolisthesis with marked bilateral facet arthrosis, causing severe central spinal canal stenosis and bilateral lateral recess stenosis, with impingement of the L5 nerves in the lateral recesses bilaterally. There is also moderate bilateral neural foraminal stenosis.   L5-S1: Mild disc bulging and bilateral facet hypertrophy, with mild central spinal canal stenosis and mild bilateral neural foraminal stenosis.   IMPRESSION: 1. Interval worsening of degenerative anterolisthesis at L4-5, now with severe central spinal canal stenosis, bilateral lateral recess stenosis and impingement of the L5 nerves bilaterally. 2. Chronic moderate to severe compression deformity of L2 status post augmentation. Persistent moderate central spinal canal stenosis.     Electronically Signed   By: Evalene Coho M.D.   On: 01/14/2024  09:57  PATIENT SURVEYS:  Modified Oswestry:  MODIFIED OSWESTRY DISABILITY SCALE  Date: 04/01/24 Score  Pain intensity 4 =  Pain medication provides me with little  relief from pain.  2. Personal care (washing, dressing, etc.) 3 =  I need help, but I am able to manage most of my personal care.  3. Lifting 5 =  I cannot lift or carry anything at all.  4. Walking 4 = I can only walk with crutches or a cane.  5. Sitting 2 =  Pain prevents me from sitting more than 1 hour.  6. Standing 2 =  Pain prevents me from standing more than 1 hour  7. Sleeping 2 =  Even when I take pain medication, I sleep less than 6 hours  8. Social Life 4 =  Pain has restricted my social life to my home  9. Traveling 5 = My pain prevents all travel except for visits to the physician/therapist or hospital  10. Employment/ Homemaking 4 = Pain prevents me from doing even light duties.  Total 35/50 (70%)   Interpretation of scores: Score Category Description  0-20% Minimal Disability The patient can cope with most living activities. Usually no treatment is indicated apart from advice on lifting, sitting and exercise  21-40% Moderate Disability The patient experiences more pain and difficulty with sitting, lifting and standing. Travel and social life are more difficult and they may be disabled from work. Personal care, sexual activity and sleeping are not grossly affected, and the patient can usually be managed by conservative means  41-60% Severe Disability Pain remains the main problem in this group, but activities of daily living are affected. These patients require a detailed investigation  61-80% Crippled Back pain impinges on all aspects of the patients life. Positive intervention is required  81-100% Bed-bound These patients are either bed-bound or exaggerating their symptoms  Bluford FORBES Zoe DELENA Karon DELENA, et al. Surgery versus conservative management of stable thoracolumbar fracture: the PRESTO feasibility RCT.  [place]): [employer]; 2021 Nov. [facility] Technology Assessment, No. 25.62.) Appendix 3, Oswestry Disability Index category descriptors. Available from: Findjewelers.cz  Minimally Clinically Important Difference (MCID) = 12.8%  COGNITION: Overall cognitive status: Within functional limits for tasks assessed     SENSATION: WFL  MUSCLE LENGTH: Hamstrings: Right 90 deg; Left 70 deg* Thomas test: Not performed    POSTURE: rounded shoulders and forward head  PALPATION:   LUMBAR ROM:   AROM eval  Flexion 100%*  Extension 100%  Right lateral flexion 100%  Left lateral flexion 100%  Right rotation 100%  Left rotation 100%   (Blank rows = not tested)  LOWER EXTREMITY ROM:     Active  Right eval Left eval  Hip flexion    Hip extension    Hip abduction    Hip adduction    Hip internal rotation    Hip external rotation    Knee flexion    Knee extension    Ankle dorsiflexion    Ankle plantarflexion    Ankle inversion    Ankle eversion     (Blank rows = not tested)  LOWER EXTREMITY MMT:    MMT Right eval Left eval  Hip flexion 4 4-*  Hip extension    Hip External Rotation in Sitting 4 4  Hip adduction 4 4  Hip internal rotation    Knee flexion 4 4  Knee extension 4 4  Ankle dorsiflexion 4 4  Ankle plantarflexion    Ankle inversion    Ankle eversion     (Blank rows = not tested)  LUMBAR SPECIAL TESTS:  Straight leg raise test: Positive  FUNCTIONAL TESTS:  5 times sit to stand: NT  30 seconds chair stand test Timed up and go (TUG): NT 2 minute walk test: 312 ft 10 meter walk test: NT  GAIT: Distance walked: 50 ft   Assistive device utilized: Environmental Consultant - 4 wheeled Level of assistance: Modified independence Comments: Decreased step length bilaterally   TREATMENT DATE:   06/08/24: THERAC  Nu-Step with seat and arms at 12 for 5 min   Use of TENS to improve activity tolerance box with setting at 4 on each  channel  Standing Modified Hip Extension on LLE 1 x 10  -min VC to decrease hip extension to avoid pain exacerbation  Standing Modified Hip Extension on LLE with yellow band 1 x 10   Standing Modified Hip Extension on LLE with red band 1 x 10    Mini-Squat with BUE support  1 x 10  -mod VC and external cue using back mat to have patient decrease anterior translation of knees over toes Mini-Squat with BUE support 3 x 8    NMR Cat Camel for Multifidi and TA activation 2 x 10 with 5 sec holds for each pose    -mod VC for TA activation and to maintain straight elbows and squeeze tummy muscles      PATIENT EDUCATION:  Education details: Form and technique for correct performance of exercise and explanation about deficits resulting from L4 nerve impingement  Person educated: Patient Education method: Explanation, Demonstration, Verbal cues, and Handouts Education comprehension: verbalized understanding, returned demonstration, and verbal cues required  HOME EXERCISE PROGRAM: Access Code: [email or web address] URL: [email or web address] Date: 06/08/2024 Prepared by: Toribio Servant  Exercises - Seated Hamstring Stretch  - 1 x daily - 7 x weekly - 3 reps - 60 sec hold - Sitting to Supine Roll  - 1 x daily - 7 x weekly - 1 sets - 5 reps - Standing Hip Extension with Counter Support  - 3-4 x weekly - 3 sets - 10 reps - Modified Cat Cow on Counter  - 1 x daily - 7 x weekly - 5 reps - 5 sec hold - Mini Squat  - 3-4 x weekly - 2 sets - 8 reps  Patient Education - Spinal Precautions   ASSESSMENT:  CLINICAL IMPRESSION:  PT continued to focus session on therapeutic exercise and neuromuscular control to promote greater activation of TA and lumbar stabilizers. She was able to perform all exercises without being limited by pain. However, she did benefit from use of TENS unit to decrease pain with activity. She does continue to have LLE neuropathic pain that appears to worsen with hip  extension. Exercises continue to focus on neutral pelvic or lumbar flexion to avoid pain exacerbation. She will continue to benefit from skilled PT to reduce her left sided low back pain and improve her mobility, os that she is not limited by pain.  OBJECTIVE IMPAIRMENTS: Abnormal gait, decreased balance, decreased coordination, decreased knowledge of condition, decreased mobility, difficulty walking, decreased ROM, decreased strength, hypomobility, impaired flexibility, impaired UE functional use, postural dysfunction, obesity, and pain.   ACTIVITY LIMITATIONS: carrying, lifting, bending, standing, squatting, sleeping, stairs, transfers, bed mobility, bathing, toileting, dressing, hygiene/grooming, and locomotion level  PARTICIPATION LIMITATIONS: meal prep, cleaning, laundry, shopping, community activity, and church  PERSONAL FACTORS: Age, Fitness, and 3+ comorbidities: HTN, severe right knee OA, chronic pain syndrome are also affecting patient's functional outcome.   REHAB POTENTIAL: Good  CLINICAL DECISION MAKING: Stable/uncomplicated  EVALUATION COMPLEXITY: Low   GOALS: Goals reviewed with patient?  No  SHORT TERM GOALS: Target date: 04/15/2024  Patient will demonstrate undestanding of home exercise plan by performing exercises correctly with evidence of good carry over with min to no verbal or tactile cues .   Baseline: NT 04/13/24: Performing exercises independently  Goal status: ACHIEVED    2.  Patient will demonstrate understanding of how to use TENS unit to relieve left sided radiating low back pain with movement by independently placing pads on her low back in box pattern and turning on unit to proper setting.  Baseline: NT  04/13/24: Pt educated on use of TENS with use of handout  Goal status: ACHIEVED     LONG TERM GOALS: Target date: 06/24/2024  Patient will improve modified Oswestry Disability Index (MODI) score by decreasing initial score by >=13% as evidence of the  minimal statistically significant change for improvement with low back pain disability and improvement in low back function (Copay et al, 2008) Baseline: 70% (35/50) Goal status: ONGOING   2.  Patient will be able to perform supine<>transfer with supervision as evidence of improved mobility and core and hip strength to remain independent.  Baseline: min A to help transfer from side lying to sit 05/25/24: Supervision with sit<>supine   Goal status: ACHIEVED    3.  Patient will perform TUG test in <12 sec as evidence of improved LLE function and mobility to decrease risk of falling and to continue to remain independent.   Baseline: 13.70 sec  05/25/24: 14 sec  with use of 2WW  Goal status: ONGOING   4. Pt will increase by at least 12.2 m (40 ft) in order to demonstrate clinically significant improvement in cardiopulmonary endurance and community ambulation. Normie & Debby, 2009). Baseline: 312 feet (9.5 x 10 M walk with rollator) 05/27/24: 310 feet with rollator with loop   Goal status: ONGOING   5.  Patient will be able to perform sit to stand without use of UE support from 18 inch mat height as evidence of improved LE strength.   Baseline: Needs use of BUE support to stand up from a seated position   05/25/24: Needs to use 1 UE support and unable to do without UE support   Goal status: ONGOING    PLAN:  PT FREQUENCY: 1-2x/week  PT DURATION: 12 weeks  PLANNED INTERVENTIONS: 97164- PT Re-evaluation, 97750- Physical Performance Testing, 97110-Therapeutic exercises, 97530- Therapeutic activity, 97112- Neuromuscular re-education, 97535- Self Care, 02859- Manual therapy, [id number]- Gait training, [phone]- Orthotic Initial, [phone]- Aquatic Therapy, [phone]- Electrical stimulation (unattended), [phone]- Electrical stimulation (manual), [id number]- Traction (mechanical), 20560 (1-2 muscles), 20561 (3+ muscles)- Dry Needling, Patient/Family education, Balance training, Stair training, Taping, Joint  mobilization, Joint manipulation, Spinal manipulation, Spinal mobilization, Vestibular training, DME instructions, Cryotherapy, and Moist heat.  PLAN FOR NEXT SESSION:  Review long rolls again to see if patient is able to sequence movement better. Continue to follow [place] lumbar laminectomy protocol: progress home exercises with emphasis on improving lumbar stabilizers and core musculature- modified bird dog with hip extension and opposite arm flexion. Introduce aerobic endurance training-laps around gym. Use TENS if needed for better pain management PADS in red folder in 2nd drawer in desk and protocol in folder on my desk.   Toribio Servant PT, DPT  [facility] [address] [place], [address] Phone: [phone]   Fax:  [phone]  "

## 2024-06-14 ENCOUNTER — Ambulatory Visit: Admitting: Physical Therapy

## 2024-06-16 ENCOUNTER — Encounter: Admitting: Neurosurgery

## 2024-06-21 ENCOUNTER — Ambulatory Visit (INDEPENDENT_AMBULATORY_CARE_PROVIDER_SITE_OTHER): Admitting: Orthopedic Surgery

## 2024-06-21 ENCOUNTER — Telehealth: Payer: Self-pay | Admitting: Orthopedic Surgery

## 2024-06-21 ENCOUNTER — Encounter: Payer: Self-pay | Admitting: Neurosurgery

## 2024-06-21 VITALS — BP 128/78 | Temp 98.4°F | Ht 67.0 in | Wt 190.0 lb

## 2024-06-21 DIAGNOSIS — Z9889 Other specified postprocedural states: Secondary | ICD-10-CM

## 2024-06-21 DIAGNOSIS — M48062 Spinal stenosis, lumbar region with neurogenic claudication: Secondary | ICD-10-CM

## 2024-06-21 NOTE — Telephone Encounter (Signed)
 Please call and let her know I reviewed with Dr. Claudene.   He still wants her to avoid bending, twisting, or lifting. No lifting more than 25 pounds.   She has these restrictions until her follow up with me.   Thanks!

## 2024-06-21 NOTE — Telephone Encounter (Signed)
 LMOM for patient to return call.

## 2024-06-21 NOTE — Progress Notes (Signed)
" ° °  REFERRING PHYSICIAN:  Sherial Bail, Md [address] [place],  [place] [address]  DOS: 05/06/24  Left L4-5 laminectomy, medial facetectomy, lateral recess decompression   HISTORY OF PRESENT ILLNESS:  She is doing great! She has minimal intermittent LBP/buttock pain that is tolerable. No leg pain. No numbness, tingling, or weakness.   She continues with PT twice a week. She is using a cane and not a walker. Feels like she is getting stronger.    PHYSICAL EXAMINATION:  General: Patient is well developed, well nourished, calm, collected, and in no apparent distress.   NEUROLOGICAL:  General: In no acute distress.   Awake, alert, oriented to person, place, and time.  Pupils equal round and reactive to light.  Facial tone is symmetric.     Strength:            Side Iliopsoas Quads Hamstring PF DF EHL  R 5 5 5 5 5 5   L 5 5 5 5 5  4+   Incision well healed    ROS (Neurologic):  Negative except as noted above  IMAGING: Nothing new to review.   ASSESSMENT/PLAN:  Erika Crawford is doing well s/p above surgery. Treatment options reviewed with patient and following plan made:   - I have advised the patient to lift up to 25 pounds until her follow up with me.  - No bending, twisting, or lifting.  - Continue with PT.  - Follow up as scheduled in 6 weeks and prn. If doing well at that time, can release her to follow up prn.   Advised to contact the office if any questions or concerns arise.  Glade Boys PA-C Department of neurosurgery "

## 2024-06-22 ENCOUNTER — Ambulatory Visit: Attending: Student in an Organized Health Care Education/Training Program

## 2024-06-22 DIAGNOSIS — M5459 Other low back pain: Secondary | ICD-10-CM | POA: Diagnosis present

## 2024-06-22 DIAGNOSIS — M5416 Radiculopathy, lumbar region: Secondary | ICD-10-CM | POA: Diagnosis present

## 2024-06-22 NOTE — Telephone Encounter (Signed)
 Patient notified and voiced understanding.

## 2024-06-22 NOTE — Therapy (Signed)
 " OUTPATIENT PHYSICAL THERAPY THORACOLUMBAR TREATMENT  Patient Name: Erika Crawford MRN: [id number] DOB:07-Jan-1941, 84 y.o., female Today's Date: 06/22/2024  END OF SESSION:  PT End of Session - 06/22/24 1518     Visit Number 8    Number of Visits 24    Date for Recertification  06/24/24    Authorization Type [employer] Dual Medicare    Authorization - Visit Number 8    Authorization - Number of Visits 24    Progress Note Due on Visit 10    PT Start Time 1519    PT Stop Time 1600    PT Time Calculation (min) 41 min    Equipment Utilized During Treatment Gait belt    Activity Tolerance Patient tolerated treatment well    Behavior During Therapy WFL for tasks assessed/performed         Past Medical History:  Diagnosis Date   Anginal pain    Arthritis    Complication of anesthesia    dizziness   GERD (gastroesophageal reflux disease)    Hypertension    Hypothyroidism    Lumbar foraminal stenosis    Lumbar radiculopathy    PAC (premature atrial contraction)    PVC (premature ventricular contraction)    Sleep apnea    cpap   Spinal stenosis    Left-sided L4-L5 lumbar   Thyroid disease    Past Surgical History:  Procedure Laterality Date   ABDOMINAL HYSTERECTOMY     BACK SURGERY     lower middle kyphoplasty   CHOLECYSTECTOMY     COLONOSCOPY WITH PROPOFOL  N/A 09/21/2015   Procedure: COLONOSCOPY WITH PROPOFOL ;  Surgeon: Deward CINDERELLA Piedmont, MD;  Location: [facility];  Service: Gastroenterology;  Laterality: N/A;   COLONOSCOPY WITH PROPOFOL  N/A 04/24/2020   Procedure: COLONOSCOPY WITH PROPOFOL ;  Surgeon: Maryruth Ole DASEN, MD;  Location: [facility];  Service: Endoscopy;  Laterality: N/A;   DECOMPRESSIVE LUMBAR LAMINECTOMY LEVEL 1 Left 05/06/2024   Procedure: Left Sided Approach to Minimally Invasive Laminectomy, medial facetectomy, and lateral recess decompression;  Surgeon: Claudene Penne ORN, MD;  Location: [facility];  Service: Neurosurgery;  Laterality: Left;  Left Sided  Approach to L4-5 Minimally Invasive Laminectomy, medial facetectomy, and lateral recess decompression   JOINT REPLACEMENT     03/19/2016- left knee replacement   REVERSE SHOULDER ARTHROPLASTY Right 08/25/2018   Procedure: REVERSE SHOULDER ARTHROPLASTY, RIGHT;  Surgeon: Edie Norleen PARAS, MD;  Location: [facility];  Service: Orthopedics;  Laterality: Right;   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   Procedure: TOTAL KNEE ARTHROPLASTY;  Surgeon: Norleen PARAS Edie, MD;  Location: [facility];  Service: Orthopedics;  Laterality: Left;   Patient Active Problem List   Diagnosis Date Noted   Status post lumbar laminectomy 05/19/2024   Weakness of left lower extremity 04/23/2024   Lumbar foraminal stenosis 04/23/2023   Chronic pain syndrome 04/23/2023   Status post reverse total shoulder replacement, right 08/25/2018   Chronic left-sided low back pain with left-sided sciatica 06/22/2018   Vitamin B 12 deficiency 01/02/2018   Chronic anemia 09/06/2017   Chronic constipation 06/08/2017   Lumbar radiculopathy 05/22/2017   Spinal stenosis, lumbar region, with neurogenic claudication 03/19/2017   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 03/10/2017   Acute bilateral low back pain without sciatica 02/04/2017   Dysuria 12/13/2016   Prediabetes 11/05/2016   Acquired hypothyroidism 08/24/2016   Myofascial pain syndrome 08/24/2016   Essential hypertension 08/24/2016   Osteopenia of multiple sites 08/24/2016   Lumbar degenerative disc  disease 08/24/2016   Bilateral hydronephrosis 07/15/2016   Rotator cuff tendinitis, left 06/21/2016   Status post total knee replacement using cement, 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   Rotator cuff tendinitis, right 11/27/2015   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   Injury of tendon of long head of right biceps 11/27/2015   Incomplete tear of right rotator cuff 11/27/2015   Obstructive sleep apnea on CPAP 07/13/2015   Osteoarthritis 11/04/2013   Disorder of uterus  04/29/1995    PCP: Dr. Lavenia Beaver    REFERRING PROVIDER: Dr.Bilal Lateef    REFERRING DIAG:  M54.16 (ICD-10-CM) - Lumbar radiculopathy M48.061 (ICD-10-CM) - Lumbar foraminal stenosis M48.062 (ICD-10-CM) - Spinal stenosis, lumbar region, with neurogenic claudication G89.4 (ICD-10-CM) - Chronic pain syndrome  Rationale for Evaluation and Treatment: Rehabilitation  THERAPY DIAG:  Other low back pain  Chronic left-sided lumbar radiculopathy  ONSET DATE: June  2025   SUBJECTIVE:                                                                                                                                                                                           SUBJECTIVE STATEMENT:   Pt reports she's feeling better today and is glad to be able to say that.  Pt is 6.5 weeks s/p her laminectomy.    PERTINENT HISTORY:   Dr. Penne Smith's Note on 05/19/24           Postoperative care following lumbar spine surgery for severe lumbar stenosis causing neurogenic claudication and radiculopathy Postoperative recovery is progressing well. Incision site is healing appropriately with some residual glue, which is normal and will flake off naturally. Reports significant improvement in leg pain and heaviness, though soreness persists, which is expected. No new neurological deficits reported. Driving is permissible as long as she is not on new postoperative pain medications. - Continue physical therapy as scheduled until February. - Advised to inform physical therapy if sessions are too early and adjust schedule accordingly. - Scheduled follow-up appointment around the new year or shortly after.    Per Dr. Charolotte note on 02/10/24  Patient states that she is having increased lumbar radicular pain that is making it very challenging for her to walk. She is currently on hydrocodone  5 mg 3 times daily as needed as well as gabapentin  300 mg 3 times a day. I recommend a Medrol  Dosepak as below.  She has an appointment already scheduled for September 11 at which point we will reevaluate. Future considerations could include bilateral L4 transforaminal ESI versus referral to neurosurgery for evaluation for surgical decompression at L4-L5.     PAIN:  Are you having pain?  Yes: NPRS scale: 7-8/10   Pain location: Left Buttocks down posterior thigh into anterior ankle    Pain description: Throbbing    Aggravating factors: Bending forward to pick up something    Relieving factors: Pain medication helps    PRECAUTIONS: None  RED FLAGS: None   WEIGHT BEARING RESTRICTIONS: No  FALLS:  Has patient fallen in last 6 months? No  LIVING ENVIRONMENT: Lives with: lives alone Lives in: House/apartment Stairs: No Has following equipment at home: Single point cane and Environmental Consultant - 4 wheeled  OCCUPATION: Retired    PLOF: Independent  PATIENT GOALS: Pt wants to feel less pain so she can return to shopping and cooking without being limited by pain.    NEXT MD VISIT: Did not ask    OBJECTIVE:  Note: Objective measures were completed at Evaluation unless otherwise noted.  VITALS BP  137/66 HR 91 SpO2  100%  DIAGNOSTIC FINDINGS:  CLINICAL DATA:  Left lower extremity radiculopathy.   EXAM: MRI LUMBAR SPINE WITHOUT CONTRAST   TECHNIQUE: Multiplanar, multisequence MR imaging of the lumbar spine was performed. No intravenous contrast was administered.   COMPARISON:  MRI of the lumbar spine dated March 18, 2017.   FINDINGS: Segmentation:  5 non rib-bearing lumbar type vertebrae.   Alignment: Grade 1 anterolisthesis at L4-5 and slight retrolisthesis at L2-3 and L3-4.   Vertebrae: Chronic moderate to severe compression deformity of L2 status post augmentation. There is persistent retropulsion of the posterior wall causing moderate central spinal canal stenosis, as before. Mild interval worsening of degenerative anterolisthesis at L4-5. No osseous lesions.   Conus medullaris and  cauda equina: Conus extends to the L1-2 level. Conus and cauda equina appear normal.   Paraspinal and other soft tissues: There are bilateral parapelvic renal cysts present. No follow-up is necessary. The paraspinous soft tissues are otherwise unremarkable.   Disc levels:   T12-L1: Normal.   L1-2: Mild disc bulging and retropulsion of the posterosuperior corner of L2, resulting in moderate central spinal canal stenosis. The neural foramina are unremarkable. There is no nerve root impingement.   L2-3: Broad-based disc bulging and bilateral facet hypertrophy, causing moderate central spinal canal stenosis and moderate bilateral lateral recess stenosis. No definite nerve root impingement.   L3-4: Broad-based disc bulging and bilateral facet hypertrophy, with moderate central spinal canal stenosis and mild-to-moderate bilateral lateral recess stenosis. No apparent nerve root impingement.   L4-5: Grade 1 anterolisthesis with marked bilateral facet arthrosis, causing severe central spinal canal stenosis and bilateral lateral recess stenosis, with impingement of the L5 nerves in the lateral recesses bilaterally. There is also moderate bilateral neural foraminal stenosis.   L5-S1: Mild disc bulging and bilateral facet hypertrophy, with mild central spinal canal stenosis and mild bilateral neural foraminal stenosis.   IMPRESSION: 1. Interval worsening of degenerative anterolisthesis at L4-5, now with severe central spinal canal stenosis, bilateral lateral recess stenosis and impingement of the L5 nerves bilaterally. 2. Chronic moderate to severe compression deformity of L2 status post augmentation. Persistent moderate central spinal canal stenosis.     Electronically Signed   By: Evalene Coho M.D.   On: 01/14/2024 09:57  PATIENT SURVEYS:  Modified Oswestry:  MODIFIED OSWESTRY DISABILITY SCALE  Date: 04/01/24 Score  Pain intensity 4 =  Pain medication provides me with  little relief from pain.  2. Personal care (washing, dressing, etc.) 3 =  I need help, but I am able to manage most of my personal care.  3. Lifting 5 =  I cannot lift or carry anything at all.  4. Walking 4 = I can only walk with crutches or a cane.  5. Sitting 2 =  Pain prevents me from sitting more than 1 hour.  6. Standing 2 =  Pain prevents me from standing more than 1 hour  7. Sleeping 2 =  Even when I take pain medication, I sleep less than 6 hours  8. Social Life 4 =  Pain has restricted my social life to my home  9. Traveling 5 = My pain prevents all travel except for visits to the physician/therapist or hospital  10. Employment/ Homemaking 4 = Pain prevents me from doing even light duties.  Total 35/50 (70%)   Interpretation of scores: Score Category Description  0-20% Minimal Disability The patient can cope with most living activities. Usually no treatment is indicated apart from advice on lifting, sitting and exercise  21-40% Moderate Disability The patient experiences more pain and difficulty with sitting, lifting and standing. Travel and social life are more difficult and they may be disabled from work. Personal care, sexual activity and sleeping are not grossly affected, and the patient can usually be managed by conservative means  41-60% Severe Disability Pain remains the main problem in this group, but activities of daily living are affected. These patients require a detailed investigation  61-80% Crippled Back pain impinges on all aspects of the patients life. Positive intervention is required  81-100% Bed-bound These patients are either bed-bound or exaggerating their symptoms  Bluford FORBES Zoe DELENA Karon DELENA, et al. Surgery versus conservative management of stable thoracolumbar fracture: the PRESTO feasibility RCT. [place] ([place]): [employer]; 2021 Nov. [facility] Technology Assessment, No. 25.62.) Appendix 3, Oswestry Disability Index category descriptors. Available  from: Findjewelers.cz  Minimally Clinically Important Difference (MCID) = 12.8%  COGNITION: Overall cognitive status: Within functional limits for tasks assessed     SENSATION: WFL  MUSCLE LENGTH: Hamstrings: Right 90 deg; Left 70 deg* Thomas test: Not performed    POSTURE: rounded shoulders and forward head  PALPATION:   LUMBAR ROM:   AROM eval  Flexion 100%*  Extension 100%  Right lateral flexion 100%  Left lateral flexion 100%  Right rotation 100%  Left rotation 100%   (Blank rows = not tested)  LOWER EXTREMITY ROM:     Active  Right eval Left eval  Hip flexion    Hip extension    Hip abduction    Hip adduction    Hip internal rotation    Hip external rotation    Knee flexion    Knee extension    Ankle dorsiflexion    Ankle plantarflexion    Ankle inversion    Ankle eversion     (Blank rows = not tested)  LOWER EXTREMITY MMT:    MMT Right eval Left eval  Hip flexion 4 4-*  Hip extension    Hip External Rotation in Sitting 4 4  Hip adduction 4 4  Hip internal rotation    Knee flexion 4 4  Knee extension 4 4  Ankle dorsiflexion 4 4  Ankle plantarflexion    Ankle inversion    Ankle eversion     (Blank rows = not tested)  LUMBAR SPECIAL TESTS:  Straight leg raise test: Positive  FUNCTIONAL TESTS:  5 times sit to stand: NT  30 seconds chair stand test Timed up and go (TUG): NT 2 minute walk test: 312 ft 10 meter walk test: NT  GAIT: Distance walked:  50 ft   Assistive device utilized: Environmental Consultant - 4 wheeled Level of assistance: Modified independence Comments: Decreased step length bilaterally   TREATMENT DATE: 06/22/2024   TherAct:  Nu-Step with seat and arms at 9, L1-4 for for 7 min total interval training for improved cardiovascular performance Level 1 - 1 min Level 3 - 1 min Level 2 - 1 min Level 4 - 1 min Level 3 - 1 min Level 2 - 1 min Level 1 - 1 min as cool-off period  Practice with log  rolling and improving the sequencing in order to reduce pain with mobility and abide by current precautions, x3 each direction  Hooklying bridges to improve lumbar extension based exercises and improve glute strength necessary for proper gait, 2x10  STS from elevated plinth, with therapist lowering it with each subsequent attempt, x10   TherEx:  Standing hip extensions, 2x10 each LE Standing mini-squats at treadmill for UE support, 2x10 Standing high knee marches, 2x10 each Le Standing resisted hip abduction at Matrix, 40#, 2x10 each LE      PATIENT EDUCATION:  Education details: Form and technique for correct performance of exercise and explanation about deficits resulting from L4 nerve impingement  Person educated: Patient Education method: Explanation, Demonstration, Verbal cues, and Handouts Education comprehension: verbalized understanding, returned demonstration, and verbal cues required  HOME EXERCISE PROGRAM: Access Code: [email or web address] URL: [email or web address] Date: 06/08/2024 Prepared by: Toribio Servant  Exercises - Seated Hamstring Stretch  - 1 x daily - 7 x weekly - 3 reps - 60 sec hold - Sitting to Supine Roll  - 1 x daily - 7 x weekly - 1 sets - 5 reps - Standing Hip Extension with Counter Support  - 3-4 x weekly - 3 sets - 10 reps - Modified Cat Cow on Counter  - 1 x daily - 7 x weekly - 5 reps - 5 sec hold - Mini Squat  - 3-4 x weekly - 2 sets - 8 reps  Patient Education - Spinal Precautions   ASSESSMENT:  CLINICAL IMPRESSION:   Pt responded well and is making significant improvements towards current goals.  Pt is able to safely ambulate with the use of the cane without any significant pain or LOB.  Pt also performed transfers without any discomfort today compared to transfer training at the last visit.  Pt introduced to STS's and was able to progressively perform with use of cane in both arm and reaching out front, along with elevated surface  with therapist lowering with each subsequent attempt.   Pt will continue to benefit from skilled therapy to address remaining deficits in order to improve overall QoL and return to PLOF.     OBJECTIVE IMPAIRMENTS: Abnormal gait, decreased balance, decreased coordination, decreased knowledge of condition, decreased mobility, difficulty walking, decreased ROM, decreased strength, hypomobility, impaired flexibility, impaired UE functional use, postural dysfunction, obesity, and pain.   ACTIVITY LIMITATIONS: carrying, lifting, bending, standing, squatting, sleeping, stairs, transfers, bed mobility, bathing, toileting, dressing, hygiene/grooming, and locomotion level  PARTICIPATION LIMITATIONS: meal prep, cleaning, laundry, shopping, community activity, and church  PERSONAL FACTORS: Age, Fitness, and 3+ comorbidities: HTN, severe right knee OA, chronic pain syndrome are also affecting patient's functional outcome.   REHAB POTENTIAL: Good  CLINICAL DECISION MAKING: Stable/uncomplicated  EVALUATION COMPLEXITY: Low   GOALS: Goals reviewed with patient? No  SHORT TERM GOALS: Target date: 04/15/2024  Patient will demonstrate undestanding of home exercise plan by performing exercises correctly with evidence of good carry over with min to  no verbal or tactile cues .   Baseline: NT 04/13/24: Performing exercises independently  Goal status: ACHIEVED    2.  Patient will demonstrate understanding of how to use TENS unit to relieve left sided radiating low back pain with movement by independently placing pads on her low back in box pattern and turning on unit to proper setting.  Baseline: NT  04/13/24: Pt educated on use of TENS with use of handout  Goal status: ACHIEVED     LONG TERM GOALS: Target date: 06/24/2024  Patient will improve modified Oswestry Disability Index (MODI) score by decreasing initial score by >=13% as evidence of the minimal statistically significant change for improvement with  low back pain disability and improvement in low back function (Copay et al, 2008) Baseline: 70% (35/50) Goal status: ONGOING   2.  Patient will be able to perform supine<>transfer with supervision as evidence of improved mobility and core and hip strength to remain independent.  Baseline: min A to help transfer from side lying to sit 05/25/24: Supervision with sit<>supine   Goal status: ACHIEVED    3.  Patient will perform TUG test in <12 sec as evidence of improved LLE function and mobility to decrease risk of falling and to continue to remain independent.   Baseline: 13.70 sec  05/25/24: 14 sec  with use of 2WW  Goal status: ONGOING   4. Pt will increase by at least 12.2 m (40 ft) in order to demonstrate clinically significant improvement in cardiopulmonary endurance and community ambulation. Normie & Debby, 2009). Baseline: 312 feet (9.5 x 10 M walk with rollator) 05/27/24: 310 feet with rollator with loop   Goal status: ONGOING   5.  Patient will be able to perform sit to stand without use of UE support from 18 inch mat height as evidence of improved LE strength.   Baseline: Needs use of BUE support to stand up from a seated position   05/25/24: Needs to use 1 UE support and unable to do without UE support   Goal status: ONGOING    PLAN:  PT FREQUENCY: 1-2x/week  PT DURATION: 12 weeks  PLANNED INTERVENTIONS: 97164- PT Re-evaluation, 97750- Physical Performance Testing, 97110-Therapeutic exercises, 97530- Therapeutic activity, 97112- Neuromuscular re-education, 97535- Self Care, 02859- Manual therapy, [id number]- Gait training, [phone]- Orthotic Initial, [phone]- Aquatic Therapy, [phone]- Electrical stimulation (unattended), [phone]- Electrical stimulation (manual), [id number]- Traction (mechanical), 20560 (1-2 muscles), 20561 (3+ muscles)- Dry Needling, Patient/Family education, Balance training, Stair training, Taping, Joint mobilization, Joint manipulation, Spinal manipulation, Spinal  mobilization, Vestibular training, DME instructions, Cryotherapy, and Moist heat.  PLAN FOR NEXT SESSION:  Review log rolls again to see if patient is able to sequence movement better. Continue to follow [place] lumbar laminectomy protocol: progress home exercises with emphasis on improving lumbar stabilizers and core musculature- modified bird dog with hip extension and opposite arm flexion. Introduce aerobic endurance training-laps around gym. Use TENS if needed for better pain management PADS in red folder in 2nd drawer in desk and protocol in folder on my desk.    Fonda Simpers, PT, DPT Physical Therapist - [facility]  06/22/2024, 5:24 PM   "

## 2024-06-22 NOTE — Telephone Encounter (Signed)
 Pt returned your call, Please call her back.

## 2024-06-24 ENCOUNTER — Telehealth: Payer: Self-pay | Admitting: Orthopedic Surgery

## 2024-06-24 ENCOUNTER — Telehealth: Payer: Self-pay | Admitting: Physician Assistant

## 2024-06-24 ENCOUNTER — Ambulatory Visit

## 2024-06-24 NOTE — Therapy (Incomplete)
 " OUTPATIENT PHYSICAL THERAPY THORACOLUMBAR TREATMENT  Patient Name: Erika Crawford MRN: [id number] DOB:[birth year], 84 y.o., female Today's Date: 06/24/2024  END OF SESSION:   Past Medical History:  Diagnosis Date   Anginal pain    Arthritis    Complication of anesthesia    dizziness   GERD (gastroesophageal reflux disease)    Hypertension    Hypothyroidism    Lumbar foraminal stenosis    Lumbar radiculopathy    PAC (premature atrial contraction)    PVC (premature ventricular contraction)    Sleep apnea    cpap   Spinal stenosis    Left-sided L4-L5 lumbar   Thyroid disease    Past Surgical History:  Procedure Laterality Date   ABDOMINAL HYSTERECTOMY     BACK SURGERY     lower middle kyphoplasty   CHOLECYSTECTOMY     COLONOSCOPY WITH PROPOFOL  N/A 09/21/2015   Procedure: COLONOSCOPY WITH PROPOFOL ;  Surgeon: Deward CINDERELLA Piedmont, MD;  Location: [facility];  Service: Gastroenterology;  Laterality: N/A;   COLONOSCOPY WITH PROPOFOL  N/A 04/24/2020   Procedure: COLONOSCOPY WITH PROPOFOL ;  Surgeon: Maryruth Ole DASEN, MD;  Location: [facility];  Service: Endoscopy;  Laterality: N/A;   DECOMPRESSIVE LUMBAR LAMINECTOMY LEVEL 1 Left 05/06/2024   Procedure: Left Sided Approach to Minimally Invasive Laminectomy, medial facetectomy, and lateral recess decompression;  Surgeon: Claudene Penne ORN, MD;  Location: [facility];  Service: Neurosurgery;  Laterality: Left;  Left Sided Approach to L4-5 Minimally Invasive Laminectomy, medial facetectomy, and lateral recess decompression   JOINT REPLACEMENT     03/19/2016- left knee replacement   REVERSE SHOULDER ARTHROPLASTY Right 08/25/2018   Procedure: REVERSE SHOULDER ARTHROPLASTY, RIGHT;  Surgeon: Edie Norleen PARAS, MD;  Location: [facility];  Service: Orthopedics;  Laterality: Right;   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   Procedure: TOTAL KNEE ARTHROPLASTY;  Surgeon: Norleen PARAS Edie, MD;  Location: [facility];  Service: Orthopedics;  Laterality: Left;    Patient Active Problem List   Diagnosis Date Noted   Status post lumbar laminectomy 05/19/2024   Weakness of left lower extremity 04/23/2024   Lumbar foraminal stenosis 04/23/2023   Chronic pain syndrome 04/23/2023   Status post reverse total shoulder replacement, right 08/25/2018   Chronic left-sided low back pain with left-sided sciatica 06/22/2018   Vitamin B 12 deficiency 01/02/2018   Chronic anemia 09/06/2017   Chronic constipation 06/08/2017   Lumbar radiculopathy 05/22/2017   Spinal stenosis, lumbar region, with neurogenic claudication 03/19/2017   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 03/10/2017   Acute bilateral low back pain without sciatica 02/04/2017   Dysuria 12/13/2016   Prediabetes 11/05/2016   Acquired hypothyroidism 08/24/2016   Myofascial pain syndrome 08/24/2016   Essential hypertension 08/24/2016   Osteopenia of multiple sites 08/24/2016   Lumbar degenerative disc disease 08/24/2016   Bilateral hydronephrosis 07/15/2016   Rotator cuff tendinitis, left 06/21/2016   Status post total knee replacement using cement, 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   Rotator cuff tendinitis, right 11/27/2015   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   Injury of tendon of long head of right biceps 11/27/2015   Incomplete tear of right rotator cuff 11/27/2015   Obstructive sleep apnea on CPAP 07/13/2015   Osteoarthritis 11/04/2013   Disorder of uterus 04/29/1995    PCP: Dr. Lavenia Beaver    REFERRING PROVIDER: Dr.Bilal Lateef    REFERRING DIAG:  M54.16 (ICD-10-CM) - Lumbar radiculopathy M48.061 (ICD-10-CM) - Lumbar foraminal stenosis M48.062 (ICD-10-CM) - Spinal stenosis, lumbar region, with neurogenic claudication G89.4 (  ICD-10-CM) - Chronic pain syndrome  Rationale for Evaluation and Treatment: Rehabilitation  THERAPY DIAG:  No diagnosis found.  ONSET DATE: June  2025   SUBJECTIVE:                                                                                                                                                                                            SUBJECTIVE STATEMENT:   ***  PERTINENT HISTORY:   Dr. Penne Smith's Note on 05/19/24           Postoperative care following lumbar spine surgery for severe lumbar stenosis causing neurogenic claudication and radiculopathy Postoperative recovery is progressing well. Incision site is healing appropriately with some residual glue, which is normal and will flake off naturally. Reports significant improvement in leg pain and heaviness, though soreness persists, which is expected. No new neurological deficits reported. Driving is permissible as long as she is not on new postoperative pain medications. - Continue physical therapy as scheduled until February. - Advised to inform physical therapy if sessions are too early and adjust schedule accordingly. - Scheduled follow-up appointment around the new year or shortly after.    Per Dr. Charolotte note on 02/10/24  Patient states that she is having increased lumbar radicular pain that is making it very challenging for her to walk. She is currently on hydrocodone  5 mg 3 times daily as needed as well as gabapentin  300 mg 3 times a day. I recommend a Medrol  Dosepak as below. She has an appointment already scheduled for September 11 at which point we will reevaluate. Future considerations could include bilateral L4 transforaminal ESI versus referral to neurosurgery for evaluation for surgical decompression at L4-L5.     PAIN:  Are you having pain? Yes: NPRS scale: 7-8/10   Pain location: Left Buttocks down posterior thigh into anterior ankle    Pain description: Throbbing    Aggravating factors: Bending forward to pick up something    Relieving factors: Pain medication helps    PRECAUTIONS: None  RED FLAGS: None   WEIGHT BEARING RESTRICTIONS: No  FALLS:  Has patient fallen in last 6 months? No  LIVING ENVIRONMENT: Lives with:  lives alone Lives in: House/apartment Stairs: No Has following equipment at home: Single point cane and Environmental Consultant - 4 wheeled  OCCUPATION: Retired    PLOF: Independent  PATIENT GOALS: Pt wants to feel less pain so she can return to shopping and cooking without being limited by pain.    NEXT MD VISIT: Did not ask    OBJECTIVE:  Note: Objective measures were completed at Evaluation unless otherwise noted.  VITALS BP  137/66  HR 91 SpO2  100%  DIAGNOSTIC FINDINGS:  CLINICAL DATA:  Left lower extremity radiculopathy.   EXAM: MRI LUMBAR SPINE WITHOUT CONTRAST   TECHNIQUE: Multiplanar, multisequence MR imaging of the lumbar spine was performed. No intravenous contrast was administered.   COMPARISON:  MRI of the lumbar spine dated March 18, 2017.   FINDINGS: Segmentation:  5 non rib-bearing lumbar type vertebrae.   Alignment: Grade 1 anterolisthesis at L4-5 and slight retrolisthesis at L2-3 and L3-4.   Vertebrae: Chronic moderate to severe compression deformity of L2 status post augmentation. There is persistent retropulsion of the posterior wall causing moderate central spinal canal stenosis, as before. Mild interval worsening of degenerative anterolisthesis at L4-5. No osseous lesions.   Conus medullaris and cauda equina: Conus extends to the L1-2 level. Conus and cauda equina appear normal.   Paraspinal and other soft tissues: There are bilateral parapelvic renal cysts present. No follow-up is necessary. The paraspinous soft tissues are otherwise unremarkable.   Disc levels:   T12-L1: Normal.   L1-2: Mild disc bulging and retropulsion of the posterosuperior corner of L2, resulting in moderate central spinal canal stenosis. The neural foramina are unremarkable. There is no nerve root impingement.   L2-3: Broad-based disc bulging and bilateral facet hypertrophy, causing moderate central spinal canal stenosis and moderate bilateral lateral recess stenosis. No  definite nerve root impingement.   L3-4: Broad-based disc bulging and bilateral facet hypertrophy, with moderate central spinal canal stenosis and mild-to-moderate bilateral lateral recess stenosis. No apparent nerve root impingement.   L4-5: Grade 1 anterolisthesis with marked bilateral facet arthrosis, causing severe central spinal canal stenosis and bilateral lateral recess stenosis, with impingement of the L5 nerves in the lateral recesses bilaterally. There is also moderate bilateral neural foraminal stenosis.   L5-S1: Mild disc bulging and bilateral facet hypertrophy, with mild central spinal canal stenosis and mild bilateral neural foraminal stenosis.   IMPRESSION: 1. Interval worsening of degenerative anterolisthesis at L4-5, now with severe central spinal canal stenosis, bilateral lateral recess stenosis and impingement of the L5 nerves bilaterally. 2. Chronic moderate to severe compression deformity of L2 status post augmentation. Persistent moderate central spinal canal stenosis.     Electronically Signed   By: Evalene Coho M.D.   On: 01/14/2024 09:57  PATIENT SURVEYS:  Modified Oswestry:  MODIFIED OSWESTRY DISABILITY SCALE  Date: 04/01/24 Score  Pain intensity 4 =  Pain medication provides me with little relief from pain.  2. Personal care (washing, dressing, etc.) 3 =  I need help, but I am able to manage most of my personal care.  3. Lifting 5 =  I cannot lift or carry anything at all.  4. Walking 4 = I can only walk with crutches or a cane.  5. Sitting 2 =  Pain prevents me from sitting more than 1 hour.  6. Standing 2 =  Pain prevents me from standing more than 1 hour  7. Sleeping 2 =  Even when I take pain medication, I sleep less than 6 hours  8. Social Life 4 =  Pain has restricted my social life to my home  9. Traveling 5 = My pain prevents all travel except for visits to the physician/therapist or hospital  10. Employment/ Homemaking 4 = Pain  prevents me from doing even light duties.  Total 35/50 (70%)   Interpretation of scores: Score Category Description  0-20% Minimal Disability The patient can cope with most living activities. Usually no treatment is indicated apart from advice  on lifting, sitting and exercise  21-40% Moderate Disability The patient experiences more pain and difficulty with sitting, lifting and standing. Travel and social life are more difficult and they may be disabled from work. Personal care, sexual activity and sleeping are not grossly affected, and the patient can usually be managed by conservative means  41-60% Severe Disability Pain remains the main problem in this group, but activities of daily living are affected. These patients require a detailed investigation  61-80% Crippled Back pain impinges on all aspects of the patients life. Positive intervention is required  81-100% Bed-bound These patients are either bed-bound or exaggerating their symptoms  Bluford FORBES Zoe DELENA Karon DELENA, et al. Surgery versus conservative management of stable thoracolumbar fracture: the PRESTO feasibility RCT. [place] ([place]): [employer]; 2021 Nov. [facility] Technology Assessment, No. 25.62.) Appendix 3, Oswestry Disability Index category descriptors. Available from: Findjewelers.cz  Minimally Clinically Important Difference (MCID) = 12.8%  COGNITION: Overall cognitive status: Within functional limits for tasks assessed     SENSATION: WFL  MUSCLE LENGTH: Hamstrings: Right 90 deg; Left 70 deg* Thomas test: Not performed    POSTURE: rounded shoulders and forward head  PALPATION:   LUMBAR ROM:   AROM eval  Flexion 100%*  Extension 100%  Right lateral flexion 100%  Left lateral flexion 100%  Right rotation 100%  Left rotation 100%   (Blank rows = not tested)  LOWER EXTREMITY ROM:     Active  Right eval Left eval  Hip flexion    Hip extension    Hip abduction     Hip adduction    Hip internal rotation    Hip external rotation    Knee flexion    Knee extension    Ankle dorsiflexion    Ankle plantarflexion    Ankle inversion    Ankle eversion     (Blank rows = not tested)  LOWER EXTREMITY MMT:    MMT Right eval Left eval  Hip flexion 4 4-*  Hip extension    Hip External Rotation in Sitting 4 4  Hip adduction 4 4  Hip internal rotation    Knee flexion 4 4  Knee extension 4 4  Ankle dorsiflexion 4 4  Ankle plantarflexion    Ankle inversion    Ankle eversion     (Blank rows = not tested)  LUMBAR SPECIAL TESTS:  Straight leg raise test: Positive  FUNCTIONAL TESTS:  5 times sit to stand: NT  30 seconds chair stand test Timed up and go (TUG): NT 2 minute walk test: 312 ft 10 meter walk test: NT  GAIT: Distance walked: 50 ft   Assistive device utilized: Environmental Consultant - 4 wheeled Level of assistance: Modified independence Comments: Decreased step length bilaterally   TREATMENT DATE: 06/24/2024  ***  TherAct:  Nu-Step with seat and arms at 9, L1-4 for for 7 min total interval training for improved cardiovascular performance Level 1 - 1 min Level 3 - 1 min Level 2 - 1 min Level 4 - 1 min Level 3 - 1 min Level 2 - 1 min Level 1 - 1 min as cool-off period  Practice with log rolling and improving the sequencing in order to reduce pain with mobility and abide by current precautions, x3 each direction  Hooklying bridges to improve lumbar extension based exercises and improve glute strength necessary for proper gait, 2x10  STS from elevated plinth, with therapist lowering it with each subsequent attempt, x10   TherEx:  Standing hip  extensions, 2x10 each LE Standing mini-squats at treadmill for UE support, 2x10 Standing high knee marches, 2x10 each Le Standing resisted hip abduction at Matrix, 40#, 2x10 each LE      PATIENT EDUCATION:  Education details: Form and technique for correct performance of exercise and  explanation about deficits resulting from L4 nerve impingement  Person educated: Patient Education method: Explanation, Demonstration, Verbal cues, and Handouts Education comprehension: verbalized understanding, returned demonstration, and verbal cues required  HOME EXERCISE PROGRAM: Access Code: [email or web address] URL: [email or web address] Date: 06/08/2024 Prepared by: Toribio Servant  Exercises - Seated Hamstring Stretch  - 1 x daily - 7 x weekly - 3 reps - 60 sec hold - Sitting to Supine Roll  - 1 x daily - 7 x weekly - 1 sets - 5 reps - Standing Hip Extension with Counter Support  - 3-4 x weekly - 3 sets - 10 reps - Modified Cat Cow on Counter  - 1 x daily - 7 x weekly - 5 reps - 5 sec hold - Mini Squat  - 3-4 x weekly - 2 sets - 8 reps  Patient Education - Spinal Precautions   ASSESSMENT:  CLINICAL IMPRESSION:   ***   OBJECTIVE IMPAIRMENTS: Abnormal gait, decreased balance, decreased coordination, decreased knowledge of condition, decreased mobility, difficulty walking, decreased ROM, decreased strength, hypomobility, impaired flexibility, impaired UE functional use, postural dysfunction, obesity, and pain.   ACTIVITY LIMITATIONS: carrying, lifting, bending, standing, squatting, sleeping, stairs, transfers, bed mobility, bathing, toileting, dressing, hygiene/grooming, and locomotion level  PARTICIPATION LIMITATIONS: meal prep, cleaning, laundry, shopping, community activity, and church  PERSONAL FACTORS: Age, Fitness, and 3+ comorbidities: HTN, severe right knee OA, chronic pain syndrome are also affecting patient's functional outcome.   REHAB POTENTIAL: Good  CLINICAL DECISION MAKING: Stable/uncomplicated  EVALUATION COMPLEXITY: Low   GOALS: Goals reviewed with patient? No  SHORT TERM GOALS: Target date: 04/15/2024  Patient will demonstrate undestanding of home exercise plan by performing exercises correctly with evidence of good carry over with min to no  verbal or tactile cues .   Baseline: NT 04/13/24: Performing exercises independently  Goal status: ACHIEVED    2.  Patient will demonstrate understanding of how to use TENS unit to relieve left sided radiating low back pain with movement by independently placing pads on her low back in box pattern and turning on unit to proper setting.  Baseline: NT  04/13/24: Pt educated on use of TENS with use of handout  Goal status: ACHIEVED     LONG TERM GOALS: Target date: 06/24/2024  Patient will improve modified Oswestry Disability Index (MODI) score by decreasing initial score by >=13% as evidence of the minimal statistically significant change for improvement with low back pain disability and improvement in low back function (Copay et al, 2008) Baseline: 70% (35/50) Goal status: ONGOING   2.  Patient will be able to perform supine<>transfer with supervision as evidence of improved mobility and core and hip strength to remain independent.  Baseline: min A to help transfer from side lying to sit 05/25/24: Supervision with sit<>supine   Goal status: ACHIEVED    3.  Patient will perform TUG test in <12 sec as evidence of improved LLE function and mobility to decrease risk of falling and to continue to remain independent.   Baseline: 13.70 sec  05/25/24: 14 sec  with use of 2WW  Goal status: ONGOING   4. Pt will increase by at least 12.2 m (40 ft) in order to demonstrate clinically significant  improvement in cardiopulmonary endurance and community ambulation. Normie & Debby, 2009). Baseline: 312 feet (9.5 x 10 M walk with rollator) 05/27/24: 310 feet with rollator with loop   Goal status: ONGOING   5.  Patient will be able to perform sit to stand without use of UE support from 18 inch mat height as evidence of improved LE strength.   Baseline: Needs use of BUE support to stand up from a seated position   05/25/24: Needs to use 1 UE support and unable to do without UE support   Goal status:  ONGOING    PLAN:  PT FREQUENCY: 1-2x/week  PT DURATION: 12 weeks  PLANNED INTERVENTIONS: 97164- PT Re-evaluation, 97750- Physical Performance Testing, 97110-Therapeutic exercises, 97530- Therapeutic activity, 97112- Neuromuscular re-education, 97535- Self Care, 02859- Manual therapy, [id number]- Gait training, [phone]- Orthotic Initial, [phone]- Aquatic Therapy, [phone]- Electrical stimulation (unattended), [phone]- Electrical stimulation (manual), [id number]- Traction (mechanical), 20560 (1-2 muscles), 20561 (3+ muscles)- Dry Needling, Patient/Family education, Balance training, Stair training, Taping, Joint mobilization, Joint manipulation, Spinal manipulation, Spinal mobilization, Vestibular training, DME instructions, Cryotherapy, and Moist heat.  PLAN FOR NEXT SESSION:   *** Review log rolls again to see if patient is able to sequence movement better. Continue to follow [place] lumbar laminectomy protocol: progress home exercises with emphasis on improving lumbar stabilizers and core musculature- modified bird dog with hip extension and opposite arm flexion. Introduce aerobic endurance training-laps around gym. Use TENS if needed for better pain management PADS in red folder in 2nd drawer in desk and protocol in folder on my desk.    Fonda Simpers, PT, DPT Physical Therapist - [facility]  06/24/2024, 8:39 AM   "

## 2024-06-24 NOTE — Telephone Encounter (Signed)
 Patient called to let our office know that she is experiencing a lot of pain after having physical therapy and she is not able to sleep. She rates her pain at a 10/10 and would like to know what she needs to do. Please advise.

## 2024-06-24 NOTE — Telephone Encounter (Signed)
" °  Spoke with Ms. Baune over the phone after calling in saying that she has been experiencing an increase in pain after physical therapy session 2 days ago.  At this point she is approximately 6 weeks status post left-sided L4-5 decompression.  She was doing very well prior to this.  She is not having intense pain in her Right knee (knee pain was resolved after she had cortisone injection by the ortho 06/11/2024) and started and continues to have pain her Left buttocks again with intermittent radiation to the side of her left leg all the way down to her left foot.   She is concerned about her sleep as she is having difficulty sleeping at night secondary to the pain currently.  We talked about taking an additional dose of hydrocodone  overnight.  Patient expressed concerns over feeling nauseous of not taking this with any food.  I have counseled her on making sure that she takes this with a light snack to try to prevent any nausea from occurring.  Will plan to add this into her nightly regimen to try to help decrease her pain.  Also discussed continuing gabapentin  and taking Flexeril during the day, not just at night.  At this continues, patient states that she will call and let us  know if she would like to try a Medrol  Dosepak.  No new weakness or saddle anesthesia.  Will plan to also take a week or 2 off from physical therapy and do light home exercises and stretching at this time.  "

## 2024-06-24 NOTE — Telephone Encounter (Signed)
 See other phone note for details from [facility]

## 2024-06-24 NOTE — Telephone Encounter (Signed)
 Spoke with patient. She was doing well until she had PT on 06/22/2024 and a new therapist worked with her that day. She did new exercises and was pushing herself more with that therapist thinking this should help her get better. After that visit that evening 06/22/2024 she was having intense pain in her Right knee (knee pain was resolved after she had cortisone injection by the ortho 06/11/2024) and started and continues to have pain her Left buttocks again with intermittent radiation to the side of her left leg all the way down to her left foot. Top of her left foot is numb-but this is chronic not new. Her left leg and buttocks was almost resolved until this flare up from PT. No new weakness, no numbness or tingling. No urinary or bowel incontinence (does have some urinary urgency-not new). No peroneum numbness.  She has taking Flexeril 10 mg 1 tablet at bedtime, Gabapentin  300 mg 1 tablet in the am, 1 tablet in the pm, and 2 tablets at bedtimes, and Hydrocodone  7.5-325 mg 1 tablet 2 times daily (these are her chronic medications). She has not taking anything else or tried anything else. She is not sure what else she can do. She is having trouble sleeping due to the intense pain that has continued.

## 2024-06-29 ENCOUNTER — Ambulatory Visit

## 2024-07-01 ENCOUNTER — Ambulatory Visit

## 2024-07-06 ENCOUNTER — Ambulatory Visit

## 2024-07-08 ENCOUNTER — Ambulatory Visit

## 2024-07-13 ENCOUNTER — Ambulatory Visit

## 2024-07-13 NOTE — Therapy (Incomplete)
 " OUTPATIENT PHYSICAL THERAPY THORACOLUMBAR TREATMENT  Patient Name: KINGA CASSAR MRN: [id number] DOB:[birth year], 84 y.o., female Today's Date: 07/13/2024  END OF SESSION:   Past Medical History:  Diagnosis Date   Anginal pain    Arthritis    Complication of anesthesia    dizziness   GERD (gastroesophageal reflux disease)    Hypertension    Hypothyroidism    Lumbar foraminal stenosis    Lumbar radiculopathy    PAC (premature atrial contraction)    PVC (premature ventricular contraction)    Sleep apnea    cpap   Spinal stenosis    Left-sided L4-L5 lumbar   Thyroid disease    Past Surgical History:  Procedure Laterality Date   ABDOMINAL HYSTERECTOMY     BACK SURGERY     lower middle kyphoplasty   CHOLECYSTECTOMY     COLONOSCOPY WITH PROPOFOL  N/A 09/21/2015   Procedure: COLONOSCOPY WITH PROPOFOL ;  Surgeon: Deward CINDERELLA Piedmont, MD;  Location: [facility];  Service: Gastroenterology;  Laterality: N/A;   COLONOSCOPY WITH PROPOFOL  N/A 04/24/2020   Procedure: COLONOSCOPY WITH PROPOFOL ;  Surgeon: Maryruth Ole DASEN, MD;  Location: [facility];  Service: Endoscopy;  Laterality: N/A;   DECOMPRESSIVE LUMBAR LAMINECTOMY LEVEL 1 Left 05/06/2024   Procedure: Left Sided Approach to Minimally Invasive Laminectomy, medial facetectomy, and lateral recess decompression;  Surgeon: Claudene Penne ORN, MD;  Location: [facility];  Service: Neurosurgery;  Laterality: Left;  Left Sided Approach to L4-5 Minimally Invasive Laminectomy, medial facetectomy, and lateral recess decompression   JOINT REPLACEMENT     03/19/2016- left knee replacement   REVERSE SHOULDER ARTHROPLASTY Right 08/25/2018   Procedure: REVERSE SHOULDER ARTHROPLASTY, RIGHT;  Surgeon: Edie Norleen PARAS, MD;  Location: [facility];  Service: Orthopedics;  Laterality: Right;   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   Procedure: TOTAL KNEE ARTHROPLASTY;  Surgeon: Norleen PARAS Edie, MD;  Location: [facility];  Service: Orthopedics;  Laterality: Left;    Patient Active Problem List   Diagnosis Date Noted   Status post lumbar laminectomy 05/19/2024   Weakness of left lower extremity 04/23/2024   Lumbar foraminal stenosis 04/23/2023   Chronic pain syndrome 04/23/2023   Status post reverse total shoulder replacement, right 08/25/2018   Chronic left-sided low back pain with left-sided sciatica 06/22/2018   Vitamin B 12 deficiency 01/02/2018   Chronic anemia 09/06/2017   Chronic constipation 06/08/2017   Lumbar radiculopathy 05/22/2017   Spinal stenosis, lumbar region, with neurogenic claudication 03/19/2017   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 03/10/2017   Acute bilateral low back pain without sciatica 02/04/2017   Dysuria 12/13/2016   Prediabetes 11/05/2016   Acquired hypothyroidism 08/24/2016   Myofascial pain syndrome 08/24/2016   Essential hypertension 08/24/2016   Osteopenia of multiple sites 08/24/2016   Lumbar degenerative disc disease 08/24/2016   Bilateral hydronephrosis 07/15/2016   Rotator cuff tendinitis, left 06/21/2016   Status post total knee replacement using cement, 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   Rotator cuff tendinitis, right 11/27/2015   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   Injury of tendon of long head of right biceps 11/27/2015   Incomplete tear of right rotator cuff 11/27/2015   Obstructive sleep apnea on CPAP 07/13/2015   Osteoarthritis 11/04/2013   Disorder of uterus 04/29/1995    PCP: Dr. Lavenia Beaver    REFERRING PROVIDER: Dr.Bilal Lateef    REFERRING DIAG:  M54.16 (ICD-10-CM) - Lumbar radiculopathy M48.061 (ICD-10-CM) - Lumbar foraminal stenosis M48.062 (ICD-10-CM) - Spinal stenosis, lumbar region, with neurogenic claudication G89.4 (  ICD-10-CM) - Chronic pain syndrome  Rationale for Evaluation and Treatment: Rehabilitation  THERAPY DIAG:  No diagnosis found.  ONSET DATE: June  2025   SUBJECTIVE:                                                                                                                                                                                            SUBJECTIVE STATEMENT:   ***  PERTINENT HISTORY:   Dr. Penne Smith's Note on 05/19/24           Postoperative care following lumbar spine surgery for severe lumbar stenosis causing neurogenic claudication and radiculopathy Postoperative recovery is progressing well. Incision site is healing appropriately with some residual glue, which is normal and will flake off naturally. Reports significant improvement in leg pain and heaviness, though soreness persists, which is expected. No new neurological deficits reported. Driving is permissible as long as she is not on new postoperative pain medications. - Continue physical therapy as scheduled until February. - Advised to inform physical therapy if sessions are too early and adjust schedule accordingly. - Scheduled follow-up appointment around the new year or shortly after.    Per Dr. Charolotte note on 02/10/24  Patient states that she is having increased lumbar radicular pain that is making it very challenging for her to walk. She is currently on hydrocodone  5 mg 3 times daily as needed as well as gabapentin  300 mg 3 times a day. I recommend a Medrol  Dosepak as below. She has an appointment already scheduled for September 11 at which point we will reevaluate. Future considerations could include bilateral L4 transforaminal ESI versus referral to neurosurgery for evaluation for surgical decompression at L4-L5.     PAIN:  Are you having pain? Yes: NPRS scale: 7-8/10   Pain location: Left Buttocks down posterior thigh into anterior ankle    Pain description: Throbbing    Aggravating factors: Bending forward to pick up something    Relieving factors: Pain medication helps    PRECAUTIONS: None  RED FLAGS: None   WEIGHT BEARING RESTRICTIONS: No  FALLS:  Has patient fallen in last 6 months? No  LIVING ENVIRONMENT: Lives with:  lives alone Lives in: House/apartment Stairs: No Has following equipment at home: Single point cane and Environmental Consultant - 4 wheeled  OCCUPATION: Retired    PLOF: Independent  PATIENT GOALS: Pt wants to feel less pain so she can return to shopping and cooking without being limited by pain.    NEXT MD VISIT: Did not ask    OBJECTIVE:  Note: Objective measures were completed at Evaluation unless otherwise noted.  VITALS BP  137/66  HR 91 SpO2  100%  DIAGNOSTIC FINDINGS:  CLINICAL DATA:  Left lower extremity radiculopathy.   EXAM: MRI LUMBAR SPINE WITHOUT CONTRAST   TECHNIQUE: Multiplanar, multisequence MR imaging of the lumbar spine was performed. No intravenous contrast was administered.   COMPARISON:  MRI of the lumbar spine dated March 18, 2017.   FINDINGS: Segmentation:  5 non rib-bearing lumbar type vertebrae.   Alignment: Grade 1 anterolisthesis at L4-5 and slight retrolisthesis at L2-3 and L3-4.   Vertebrae: Chronic moderate to severe compression deformity of L2 status post augmentation. There is persistent retropulsion of the posterior wall causing moderate central spinal canal stenosis, as before. Mild interval worsening of degenerative anterolisthesis at L4-5. No osseous lesions.   Conus medullaris and cauda equina: Conus extends to the L1-2 level. Conus and cauda equina appear normal.   Paraspinal and other soft tissues: There are bilateral parapelvic renal cysts present. No follow-up is necessary. The paraspinous soft tissues are otherwise unremarkable.   Disc levels:   T12-L1: Normal.   L1-2: Mild disc bulging and retropulsion of the posterosuperior corner of L2, resulting in moderate central spinal canal stenosis. The neural foramina are unremarkable. There is no nerve root impingement.   L2-3: Broad-based disc bulging and bilateral facet hypertrophy, causing moderate central spinal canal stenosis and moderate bilateral lateral recess stenosis. No  definite nerve root impingement.   L3-4: Broad-based disc bulging and bilateral facet hypertrophy, with moderate central spinal canal stenosis and mild-to-moderate bilateral lateral recess stenosis. No apparent nerve root impingement.   L4-5: Grade 1 anterolisthesis with marked bilateral facet arthrosis, causing severe central spinal canal stenosis and bilateral lateral recess stenosis, with impingement of the L5 nerves in the lateral recesses bilaterally. There is also moderate bilateral neural foraminal stenosis.   L5-S1: Mild disc bulging and bilateral facet hypertrophy, with mild central spinal canal stenosis and mild bilateral neural foraminal stenosis.   IMPRESSION: 1. Interval worsening of degenerative anterolisthesis at L4-5, now with severe central spinal canal stenosis, bilateral lateral recess stenosis and impingement of the L5 nerves bilaterally. 2. Chronic moderate to severe compression deformity of L2 status post augmentation. Persistent moderate central spinal canal stenosis.     Electronically Signed   By: Evalene Coho M.D.   On: 01/14/2024 09:57  PATIENT SURVEYS:  Modified Oswestry:  MODIFIED OSWESTRY DISABILITY SCALE  Date: 04/01/24 Score  Pain intensity 4 =  Pain medication provides me with little relief from pain.  2. Personal care (washing, dressing, etc.) 3 =  I need help, but I am able to manage most of my personal care.  3. Lifting 5 =  I cannot lift or carry anything at all.  4. Walking 4 = I can only walk with crutches or a cane.  5. Sitting 2 =  Pain prevents me from sitting more than 1 hour.  6. Standing 2 =  Pain prevents me from standing more than 1 hour  7. Sleeping 2 =  Even when I take pain medication, I sleep less than 6 hours  8. Social Life 4 =  Pain has restricted my social life to my home  9. Traveling 5 = My pain prevents all travel except for visits to the physician/therapist or hospital  10. Employment/ Homemaking 4 = Pain  prevents me from doing even light duties.  Total 35/50 (70%)   Interpretation of scores: Score Category Description  0-20% Minimal Disability The patient can cope with most living activities. Usually no treatment is indicated apart from advice  on lifting, sitting and exercise  21-40% Moderate Disability The patient experiences more pain and difficulty with sitting, lifting and standing. Travel and social life are more difficult and they may be disabled from work. Personal care, sexual activity and sleeping are not grossly affected, and the patient can usually be managed by conservative means  41-60% Severe Disability Pain remains the main problem in this group, but activities of daily living are affected. These patients require a detailed investigation  61-80% Crippled Back pain impinges on all aspects of the patients life. Positive intervention is required  81-100% Bed-bound These patients are either bed-bound or exaggerating their symptoms  Bluford FORBES Zoe DELENA Karon DELENA, et al. Surgery versus conservative management of stable thoracolumbar fracture: the PRESTO feasibility RCT. [place] ([place]): [employer]; 2021 Nov. [facility] Technology Assessment, No. 25.62.) Appendix 3, Oswestry Disability Index category descriptors. Available from: Findjewelers.cz  Minimally Clinically Important Difference (MCID) = 12.8%  COGNITION: Overall cognitive status: Within functional limits for tasks assessed     SENSATION: WFL  MUSCLE LENGTH: Hamstrings: Right 90 deg; Left 70 deg* Thomas test: Not performed    POSTURE: rounded shoulders and forward head  PALPATION:   LUMBAR ROM:   AROM eval  Flexion 100%*  Extension 100%  Right lateral flexion 100%  Left lateral flexion 100%  Right rotation 100%  Left rotation 100%   (Blank rows = not tested)  LOWER EXTREMITY ROM:     Active  Right eval Left eval  Hip flexion    Hip extension    Hip abduction     Hip adduction    Hip internal rotation    Hip external rotation    Knee flexion    Knee extension    Ankle dorsiflexion    Ankle plantarflexion    Ankle inversion    Ankle eversion     (Blank rows = not tested)  LOWER EXTREMITY MMT:    MMT Right eval Left eval  Hip flexion 4 4-*  Hip extension    Hip External Rotation in Sitting 4 4  Hip adduction 4 4  Hip internal rotation    Knee flexion 4 4  Knee extension 4 4  Ankle dorsiflexion 4 4  Ankle plantarflexion    Ankle inversion    Ankle eversion     (Blank rows = not tested)  LUMBAR SPECIAL TESTS:  Straight leg raise test: Positive  FUNCTIONAL TESTS:  5 times sit to stand: NT  30 seconds chair stand test Timed up and go (TUG): NT 2 minute walk test: 312 ft 10 meter walk test: NT  GAIT: Distance walked: 50 ft   Assistive device utilized: Environmental Consultant - 4 wheeled Level of assistance: Modified independence Comments: Decreased step length bilaterally   TREATMENT DATE: 07/13/2024  ***  TherAct:  Nu-Step with seat and arms at 9, L1-4 for for 7 min total interval training for improved cardiovascular performance Level 1 - 1 min Level 3 - 1 min Level 2 - 1 min Level 4 - 1 min Level 3 - 1 min Level 2 - 1 min Level 1 - 1 min as cool-off period  Practice with log rolling and improving the sequencing in order to reduce pain with mobility and abide by current precautions, x3 each direction  Hooklying bridges to improve lumbar extension based exercises and improve glute strength necessary for proper gait, 2x10  STS from elevated plinth, with therapist lowering it with each subsequent attempt, x10   TherEx:  Standing hip  extensions, 2x10 each LE Standing mini-squats at treadmill for UE support, 2x10 Standing high knee marches, 2x10 each Le Standing resisted hip abduction at Matrix, 40#, 2x10 each LE      PATIENT EDUCATION:  Education details: Form and technique for correct performance of exercise and  explanation about deficits resulting from L4 nerve impingement  Person educated: Patient Education method: Explanation, Demonstration, Verbal cues, and Handouts Education comprehension: verbalized understanding, returned demonstration, and verbal cues required  HOME EXERCISE PROGRAM: Access Code: [email or web address] URL: [email or web address] Date: 06/08/2024 Prepared by: Toribio Servant  Exercises - Seated Hamstring Stretch  - 1 x daily - 7 x weekly - 3 reps - 60 sec hold - Sitting to Supine Roll  - 1 x daily - 7 x weekly - 1 sets - 5 reps - Standing Hip Extension with Counter Support  - 3-4 x weekly - 3 sets - 10 reps - Modified Cat Cow on Counter  - 1 x daily - 7 x weekly - 5 reps - 5 sec hold - Mini Squat  - 3-4 x weekly - 2 sets - 8 reps  Patient Education - Spinal Precautions   ASSESSMENT:  CLINICAL IMPRESSION:   ***   OBJECTIVE IMPAIRMENTS: Abnormal gait, decreased balance, decreased coordination, decreased knowledge of condition, decreased mobility, difficulty walking, decreased ROM, decreased strength, hypomobility, impaired flexibility, impaired UE functional use, postural dysfunction, obesity, and pain.   ACTIVITY LIMITATIONS: carrying, lifting, bending, standing, squatting, sleeping, stairs, transfers, bed mobility, bathing, toileting, dressing, hygiene/grooming, and locomotion level  PARTICIPATION LIMITATIONS: meal prep, cleaning, laundry, shopping, community activity, and church  PERSONAL FACTORS: Age, Fitness, and 3+ comorbidities: HTN, severe right knee OA, chronic pain syndrome are also affecting patient's functional outcome.   REHAB POTENTIAL: Good  CLINICAL DECISION MAKING: Stable/uncomplicated  EVALUATION COMPLEXITY: Low   GOALS: Goals reviewed with patient? No  SHORT TERM GOALS: Target date: 04/15/2024  Patient will demonstrate undestanding of home exercise plan by performing exercises correctly with evidence of good carry over with min to no  verbal or tactile cues .   Baseline: NT 04/13/24: Performing exercises independently  Goal status: ACHIEVED    2.  Patient will demonstrate understanding of how to use TENS unit to relieve left sided radiating low back pain with movement by independently placing pads on her low back in box pattern and turning on unit to proper setting.  Baseline: NT  04/13/24: Pt educated on use of TENS with use of handout  Goal status: ACHIEVED     LONG TERM GOALS: Target date: 06/24/2024  Patient will improve modified Oswestry Disability Index (MODI) score by decreasing initial score by >=13% as evidence of the minimal statistically significant change for improvement with low back pain disability and improvement in low back function (Copay et al, 2008) Baseline: 70% (35/50) Goal status: ONGOING   2.  Patient will be able to perform supine<>transfer with supervision as evidence of improved mobility and core and hip strength to remain independent.  Baseline: min A to help transfer from side lying to sit 05/25/24: Supervision with sit<>supine   Goal status: ACHIEVED    3.  Patient will perform TUG test in <12 sec as evidence of improved LLE function and mobility to decrease risk of falling and to continue to remain independent.   Baseline: 13.70 sec  05/25/24: 14 sec  with use of 2WW  Goal status: ONGOING   4. Pt will increase by at least 12.2 m (40 ft) in order to demonstrate clinically significant  improvement in cardiopulmonary endurance and community ambulation. Normie & Debby, 2009). Baseline: 312 feet (9.5 x 10 M walk with rollator) 05/27/24: 310 feet with rollator with loop   Goal status: ONGOING   5.  Patient will be able to perform sit to stand without use of UE support from 18 inch mat height as evidence of improved LE strength.   Baseline: Needs use of BUE support to stand up from a seated position   05/25/24: Needs to use 1 UE support and unable to do without UE support   Goal status:  ONGOING    PLAN:  PT FREQUENCY: 1-2x/week  PT DURATION: 12 weeks  PLANNED INTERVENTIONS: 97164- PT Re-evaluation, 97750- Physical Performance Testing, 97110-Therapeutic exercises, 97530- Therapeutic activity, 97112- Neuromuscular re-education, 97535- Self Care, [id number]- Manual therapy, [id number]- Gait training, [phone]- Orthotic Initial, [phone]- Aquatic Therapy, [phone]- Electrical stimulation (unattended), [phone]- Electrical stimulation (manual), [id number]- Traction (mechanical), 20560 (1-2 muscles), 20561 (3+ muscles)- Dry Needling, Patient/Family education, Balance training, Stair training, Taping, Joint mobilization, Joint manipulation, Spinal manipulation, Spinal mobilization, Vestibular training, DME instructions, Cryotherapy, and Moist heat.  PLAN FOR NEXT SESSION:   *** Review log rolls again to see if patient is able to sequence movement better. Continue to follow [place]  [place] lumbar laminectomy protocol: progress home exercises with emphasis on improving lumbar stabilizers and core musculature- modified bird dog with hip extension and opposite arm flexion. Introduce aerobic endurance training-laps around gym. Use TENS if needed for better pain management PADS in red folder in 2nd drawer in desk and protocol in folder on my desk.    Fonda Simpers, PT, DPT Physical Therapist - [facility]  07/13/24, 8:05 AM   "

## 2024-07-15 ENCOUNTER — Ambulatory Visit

## 2024-07-15 NOTE — Therapy (Incomplete)
 " OUTPATIENT PHYSICAL THERAPY THORACOLUMBAR TREATMENT  Patient Name: Erika Crawford MRN: [id number] DOB:08-Jan-1941, 84 y.o., female Today's Date: 07/15/2024  END OF SESSION:   Past Medical History:  Diagnosis Date   Anginal pain    Arthritis    Complication of anesthesia    dizziness   GERD (gastroesophageal reflux disease)    Hypertension    Hypothyroidism    Lumbar foraminal stenosis    Lumbar radiculopathy    PAC (premature atrial contraction)    PVC (premature ventricular contraction)    Sleep apnea    cpap   Spinal stenosis    Left-sided L4-L5 lumbar   Thyroid disease    Past Surgical History:  Procedure Laterality Date   ABDOMINAL HYSTERECTOMY     BACK SURGERY     lower middle kyphoplasty   CHOLECYSTECTOMY     COLONOSCOPY WITH PROPOFOL  N/A 09/21/2015   Procedure: COLONOSCOPY WITH PROPOFOL ;  Surgeon: Deward CINDERELLA Piedmont, MD;  Location: [facility];  Service: Gastroenterology;  Laterality: N/A;   COLONOSCOPY WITH PROPOFOL  N/A 04/24/2020   Procedure: COLONOSCOPY WITH PROPOFOL ;  Surgeon: Maryruth Ole DASEN, MD;  Location: [facility];  Service: Endoscopy;  Laterality: N/A;   DECOMPRESSIVE LUMBAR LAMINECTOMY LEVEL 1 Left 05/06/2024   Procedure: Left Sided Approach to Minimally Invasive Laminectomy, medial facetectomy, and lateral recess decompression;  Surgeon: Claudene Penne ORN, MD;  Location: [facility];  Service: Neurosurgery;  Laterality: Left;  Left Sided Approach to L4-5 Minimally Invasive Laminectomy, medial facetectomy, and lateral recess decompression   JOINT REPLACEMENT     03/19/2016- left knee replacement   REVERSE SHOULDER ARTHROPLASTY Right 08/25/2018   Procedure: REVERSE SHOULDER ARTHROPLASTY, RIGHT;  Surgeon: Edie Norleen PARAS, MD;  Location: [facility];  Service: Orthopedics;  Laterality: Right;   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   Procedure: TOTAL KNEE ARTHROPLASTY;  Surgeon: Norleen PARAS Edie, MD;  Location: [facility];  Service: Orthopedics;  Laterality: Left;    Patient Active Problem List   Diagnosis Date Noted   Status post lumbar laminectomy 05/19/2024   Weakness of left lower extremity 04/23/2024   Lumbar foraminal stenosis 04/23/2023   Chronic pain syndrome 04/23/2023   Status post reverse total shoulder replacement, right 08/25/2018   Chronic left-sided low back pain with left-sided sciatica 06/22/2018   Vitamin B 12 deficiency 01/02/2018   Chronic anemia 09/06/2017   Chronic constipation 06/08/2017   Lumbar radiculopathy 05/22/2017   Spinal stenosis, lumbar region, with neurogenic claudication 03/19/2017   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 03/10/2017   Acute bilateral low back pain without sciatica 02/04/2017   Dysuria 12/13/2016   Prediabetes 11/05/2016   Acquired hypothyroidism 08/24/2016   Myofascial pain syndrome 08/24/2016   Essential hypertension 08/24/2016   Osteopenia of multiple sites 08/24/2016   Lumbar degenerative disc disease 08/24/2016   Bilateral hydronephrosis 07/15/2016   Rotator cuff tendinitis, left 06/21/2016   Status post total knee replacement using cement, 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   Rotator cuff tendinitis, right 11/27/2015   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   Injury of tendon of long head of right biceps 11/27/2015   Incomplete tear of right rotator cuff 11/27/2015   Obstructive sleep apnea on CPAP 07/13/2015   Osteoarthritis 11/04/2013   Disorder of uterus 04/29/1995    PCP: Dr. Lavenia Beaver    REFERRING PROVIDER: Dr.Bilal Lateef    REFERRING DIAG:  M54.16 (ICD-10-CM) - Lumbar radiculopathy M48.061 (ICD-10-CM) - Lumbar foraminal stenosis M48.062 (ICD-10-CM) - Spinal stenosis, lumbar region, with neurogenic claudication G89.4 (  ICD-10-CM) - Chronic pain syndrome  Rationale for Evaluation and Treatment: Rehabilitation  THERAPY DIAG:  No diagnosis found.  ONSET DATE: June  2025   SUBJECTIVE:                                                                                                                                                                                            SUBJECTIVE STATEMENT:   ***  PERTINENT HISTORY:   Dr. Penne Smith's Note on 05/19/24           Postoperative care following lumbar spine surgery for severe lumbar stenosis causing neurogenic claudication and radiculopathy Postoperative recovery is progressing well. Incision site is healing appropriately with some residual glue, which is normal and will flake off naturally. Reports significant improvement in leg pain and heaviness, though soreness persists, which is expected. No new neurological deficits reported. Driving is permissible as long as she is not on new postoperative pain medications. - Continue physical therapy as scheduled until February. - Advised to inform physical therapy if sessions are too early and adjust schedule accordingly. - Scheduled follow-up appointment around the new year or shortly after.    Per Dr. Charolotte note on 02/10/24  Patient states that she is having increased lumbar radicular pain that is making it very challenging for her to walk. She is currently on hydrocodone  5 mg 3 times daily as needed as well as gabapentin  300 mg 3 times a day. I recommend a Medrol  Dosepak as below. She has an appointment already scheduled for September 11 at which point we will reevaluate. Future considerations could include bilateral L4 transforaminal ESI versus referral to neurosurgery for evaluation for surgical decompression at L4-L5.     PAIN:  Are you having pain? Yes: NPRS scale: 7-8/10   Pain location: Left Buttocks down posterior thigh into anterior ankle    Pain description: Throbbing    Aggravating factors: Bending forward to pick up something    Relieving factors: Pain medication helps    PRECAUTIONS: None  RED FLAGS: None   WEIGHT BEARING RESTRICTIONS: No  FALLS:  Has patient fallen in last 6 months? No  LIVING ENVIRONMENT: Lives with:  lives alone Lives in: House/apartment Stairs: No Has following equipment at home: Single point cane and Environmental Consultant - 4 wheeled  OCCUPATION: Retired    PLOF: Independent  PATIENT GOALS: Pt wants to feel less pain so she can return to shopping and cooking without being limited by pain.    NEXT MD VISIT: Did not ask    OBJECTIVE:  Note: Objective measures were completed at Evaluation unless otherwise noted.  VITALS BP  137/66  HR 91 SpO2  100%  DIAGNOSTIC FINDINGS:  CLINICAL DATA:  Left lower extremity radiculopathy.   EXAM: MRI LUMBAR SPINE WITHOUT CONTRAST   TECHNIQUE: Multiplanar, multisequence MR imaging of the lumbar spine was performed. No intravenous contrast was administered.   COMPARISON:  MRI of the lumbar spine dated March 18, 2017.   FINDINGS: Segmentation:  5 non rib-bearing lumbar type vertebrae.   Alignment: Grade 1 anterolisthesis at L4-5 and slight retrolisthesis at L2-3 and L3-4.   Vertebrae: Chronic moderate to severe compression deformity of L2 status post augmentation. There is persistent retropulsion of the posterior wall causing moderate central spinal canal stenosis, as before. Mild interval worsening of degenerative anterolisthesis at L4-5. No osseous lesions.   Conus medullaris and cauda equina: Conus extends to the L1-2 level. Conus and cauda equina appear normal.   Paraspinal and other soft tissues: There are bilateral parapelvic renal cysts present. No follow-up is necessary. The paraspinous soft tissues are otherwise unremarkable.   Disc levels:   T12-L1: Normal.   L1-2: Mild disc bulging and retropulsion of the posterosuperior corner of L2, resulting in moderate central spinal canal stenosis. The neural foramina are unremarkable. There is no nerve root impingement.   L2-3: Broad-based disc bulging and bilateral facet hypertrophy, causing moderate central spinal canal stenosis and moderate bilateral lateral recess stenosis. No  definite nerve root impingement.   L3-4: Broad-based disc bulging and bilateral facet hypertrophy, with moderate central spinal canal stenosis and mild-to-moderate bilateral lateral recess stenosis. No apparent nerve root impingement.   L4-5: Grade 1 anterolisthesis with marked bilateral facet arthrosis, causing severe central spinal canal stenosis and bilateral lateral recess stenosis, with impingement of the L5 nerves in the lateral recesses bilaterally. There is also moderate bilateral neural foraminal stenosis.   L5-S1: Mild disc bulging and bilateral facet hypertrophy, with mild central spinal canal stenosis and mild bilateral neural foraminal stenosis.   IMPRESSION: 1. Interval worsening of degenerative anterolisthesis at L4-5, now with severe central spinal canal stenosis, bilateral lateral recess stenosis and impingement of the L5 nerves bilaterally. 2. Chronic moderate to severe compression deformity of L2 status post augmentation. Persistent moderate central spinal canal stenosis.     Electronically Signed   By: Evalene Coho M.D.   On: 01/14/2024 09:57  PATIENT SURVEYS:  Modified Oswestry:  MODIFIED OSWESTRY DISABILITY SCALE  Date: 04/01/24 Score  Pain intensity 4 =  Pain medication provides me with little relief from pain.  2. Personal care (washing, dressing, etc.) 3 =  I need help, but I am able to manage most of my personal care.  3. Lifting 5 =  I cannot lift or carry anything at all.  4. Walking 4 = I can only walk with crutches or a cane.  5. Sitting 2 =  Pain prevents me from sitting more than 1 hour.  6. Standing 2 =  Pain prevents me from standing more than 1 hour  7. Sleeping 2 =  Even when I take pain medication, I sleep less than 6 hours  8. Social Life 4 =  Pain has restricted my social life to my home  9. Traveling 5 = My pain prevents all travel except for visits to the physician/therapist or hospital  10. Employment/ Homemaking 4 = Pain  prevents me from doing even light duties.  Total 35/50 (70%)   Interpretation of scores: Score Category Description  0-20% Minimal Disability The patient can cope with most living activities. Usually no treatment is indicated apart from advice  on lifting, sitting and exercise  21-40% Moderate Disability The patient experiences more pain and difficulty with sitting, lifting and standing. Travel and social life are more difficult and they may be disabled from work. Personal care, sexual activity and sleeping are not grossly affected, and the patient can usually be managed by conservative means  41-60% Severe Disability Pain remains the main problem in this group, but activities of daily living are affected. These patients require a detailed investigation  61-80% Crippled Back pain impinges on all aspects of the patients life. Positive intervention is required  81-100% Bed-bound These patients are either bed-bound or exaggerating their symptoms  Bluford FORBES Zoe DELENA Karon DELENA, et al. Surgery versus conservative management of stable thoracolumbar fracture: the PRESTO feasibility RCT. [place] ([place]): [employer]; 2021 Nov. [facility] Technology Assessment, No. 25.62.) Appendix 3, Oswestry Disability Index category descriptors. Available from: Findjewelers.cz  Minimally Clinically Important Difference (MCID) = 12.8%  COGNITION: Overall cognitive status: Within functional limits for tasks assessed     SENSATION: WFL  MUSCLE LENGTH: Hamstrings: Right 90 deg; Left 70 deg* Thomas test: Not performed    POSTURE: rounded shoulders and forward head  PALPATION:   LUMBAR ROM:   AROM eval  Flexion 100%*  Extension 100%  Right lateral flexion 100%  Left lateral flexion 100%  Right rotation 100%  Left rotation 100%   (Blank rows = not tested)  LOWER EXTREMITY ROM:     Active  Right eval Left eval  Hip flexion    Hip extension    Hip abduction     Hip adduction    Hip internal rotation    Hip external rotation    Knee flexion    Knee extension    Ankle dorsiflexion    Ankle plantarflexion    Ankle inversion    Ankle eversion     (Blank rows = not tested)  LOWER EXTREMITY MMT:    MMT Right eval Left eval  Hip flexion 4 4-*  Hip extension    Hip External Rotation in Sitting 4 4  Hip adduction 4 4  Hip internal rotation    Knee flexion 4 4  Knee extension 4 4  Ankle dorsiflexion 4 4  Ankle plantarflexion    Ankle inversion    Ankle eversion     (Blank rows = not tested)  LUMBAR SPECIAL TESTS:  Straight leg raise test: Positive  FUNCTIONAL TESTS:  5 times sit to stand: NT  30 seconds chair stand test Timed up and go (TUG): NT 2 minute walk test: 312 ft 10 meter walk test: NT  GAIT: Distance walked: 50 ft   Assistive device utilized: Environmental Consultant - 4 wheeled Level of assistance: Modified independence Comments: Decreased step length bilaterally   TREATMENT DATE: 07/15/2024  ***  TherAct:  Nu-Step with seat and arms at 9, L1-4 for for 7 min total interval training for improved cardiovascular performance Level 1 - 1 min Level 3 - 1 min Level 2 - 1 min Level 4 - 1 min Level 3 - 1 min Level 2 - 1 min Level 1 - 1 min as cool-off period  Practice with log rolling and improving the sequencing in order to reduce pain with mobility and abide by current precautions, x3 each direction  Hooklying bridges to improve lumbar extension based exercises and improve glute strength necessary for proper gait, 2x10  STS from elevated plinth, with therapist lowering it with each subsequent attempt, x10   TherEx:  Standing hip  extensions, 2x10 each LE Standing mini-squats at treadmill for UE support, 2x10 Standing high knee marches, 2x10 each Le Standing resisted hip abduction at Matrix, 40#, 2x10 each LE      PATIENT EDUCATION:  Education details: Form and technique for correct performance of exercise and  explanation about deficits resulting from L4 nerve impingement  Person educated: Patient Education method: Explanation, Demonstration, Verbal cues, and Handouts Education comprehension: verbalized understanding, returned demonstration, and verbal cues required  HOME EXERCISE PROGRAM: Access Code: [email or web address] URL: [email or web address] Date: 06/08/2024 Prepared by: Toribio Servant  Exercises - Seated Hamstring Stretch  - 1 x daily - 7 x weekly - 3 reps - 60 sec hold - Sitting to Supine Roll  - 1 x daily - 7 x weekly - 1 sets - 5 reps - Standing Hip Extension with Counter Support  - 3-4 x weekly - 3 sets - 10 reps - Modified Cat Cow on Counter  - 1 x daily - 7 x weekly - 5 reps - 5 sec hold - Mini Squat  - 3-4 x weekly - 2 sets - 8 reps  Patient Education - Spinal Precautions   ASSESSMENT:  CLINICAL IMPRESSION:   ***   OBJECTIVE IMPAIRMENTS: Abnormal gait, decreased balance, decreased coordination, decreased knowledge of condition, decreased mobility, difficulty walking, decreased ROM, decreased strength, hypomobility, impaired flexibility, impaired UE functional use, postural dysfunction, obesity, and pain.   ACTIVITY LIMITATIONS: carrying, lifting, bending, standing, squatting, sleeping, stairs, transfers, bed mobility, bathing, toileting, dressing, hygiene/grooming, and locomotion level  PARTICIPATION LIMITATIONS: meal prep, cleaning, laundry, shopping, community activity, and church  PERSONAL FACTORS: Age, Fitness, and 3+ comorbidities: HTN, severe right knee OA, chronic pain syndrome are also affecting patient's functional outcome.   REHAB POTENTIAL: Good  CLINICAL DECISION MAKING: Stable/uncomplicated  EVALUATION COMPLEXITY: Low   GOALS: Goals reviewed with patient? No  SHORT TERM GOALS: Target date: 04/15/2024  Patient will demonstrate undestanding of home exercise plan by performing exercises correctly with evidence of good carry over with min to no  verbal or tactile cues .   Baseline: NT 04/13/24: Performing exercises independently  Goal status: ACHIEVED    2.  Patient will demonstrate understanding of how to use TENS unit to relieve left sided radiating low back pain with movement by independently placing pads on her low back in box pattern and turning on unit to proper setting.  Baseline: NT  04/13/24: Pt educated on use of TENS with use of handout  Goal status: ACHIEVED     LONG TERM GOALS: Target date: 06/24/2024  Patient will improve modified Oswestry Disability Index (MODI) score by decreasing initial score by >=13% as evidence of the minimal statistically significant change for improvement with low back pain disability and improvement in low back function (Copay et al, 2008) Baseline: 70% (35/50) Goal status: ONGOING   2.  Patient will be able to perform supine<>transfer with supervision as evidence of improved mobility and core and hip strength to remain independent.  Baseline: min A to help transfer from side lying to sit 05/25/24: Supervision with sit<>supine   Goal status: ACHIEVED    3.  Patient will perform TUG test in <12 sec as evidence of improved LLE function and mobility to decrease risk of falling and to continue to remain independent.   Baseline: 13.70 sec  05/25/24: 14 sec  with use of 2WW  Goal status: ONGOING   4. Pt will increase by at least 12.2 m (40 ft) in order to demonstrate clinically significant  improvement in cardiopulmonary endurance and community ambulation. Normie & Debby, 2009). Baseline: 312 feet (9.5 x 10 M walk with rollator) 05/27/24: 310 feet with rollator with loop   Goal status: ONGOING   5.  Patient will be able to perform sit to stand without use of UE support from 18 inch mat height as evidence of improved LE strength.   Baseline: Needs use of BUE support to stand up from a seated position   05/25/24: Needs to use 1 UE support and unable to do without UE support   Goal status:  ONGOING    PLAN:  PT FREQUENCY: 1-2x/week  PT DURATION: 12 weeks  PLANNED INTERVENTIONS: 97164- PT Re-evaluation, 97750- Physical Performance Testing, 97110-Therapeutic exercises, 97530- Therapeutic activity, 97112- Neuromuscular re-education, 97535- Self Care, [id number]- Manual therapy, [id number]- Gait training, [phone]- Orthotic Initial, [phone]- Aquatic Therapy, [phone]- Electrical stimulation (unattended), [phone]- Electrical stimulation (manual), [id number]- Traction (mechanical), 20560 (1-2 muscles), 20561 (3+ muscles)- Dry Needling, Patient/Family education, Balance training, Stair training, Taping, Joint mobilization, Joint manipulation, Spinal manipulation, Spinal mobilization, Vestibular training, DME instructions, Cryotherapy, and Moist heat.  PLAN FOR NEXT SESSION:   *** Review log rolls again to see if patient is able to sequence movement better. Continue to follow [place] lumbar laminectomy protocol: progress home exercises with emphasis on improving lumbar stabilizers and core musculature- modified bird dog with hip extension and opposite arm flexion. Introduce aerobic endurance training-laps around gym. Use TENS if needed for better pain management PADS in red folder in 2nd drawer in desk and protocol in folder on my desk.    Fonda Simpers, PT, DPT Physical Therapist - [facility]  07/15/24, 1:35 PM   "

## 2024-07-20 ENCOUNTER — Ambulatory Visit

## 2024-07-20 ENCOUNTER — Telehealth: Payer: Self-pay

## 2024-07-20 ENCOUNTER — Encounter: Admitting: Physician Assistant

## 2024-07-20 NOTE — Telephone Encounter (Signed)
 Called patient in reference to no show on scheduled appointment day and time. Left voicemail as patient did not answer the phone. Reminded patient to call office with any changes that need to be made with schedule going forward.

## 2024-07-21 ENCOUNTER — Encounter: Admitting: Physician Assistant

## 2024-07-22 ENCOUNTER — Ambulatory Visit

## 2024-07-27 ENCOUNTER — Ambulatory Visit

## 2024-07-29 ENCOUNTER — Ambulatory Visit

## 2024-08-02 ENCOUNTER — Encounter

## 2024-08-03 ENCOUNTER — Ambulatory Visit

## 2024-08-04 ENCOUNTER — Encounter: Admitting: Orthopedic Surgery

## 2024-08-05 ENCOUNTER — Ambulatory Visit

## 2024-08-10 ENCOUNTER — Ambulatory Visit

## 2024-08-12 ENCOUNTER — Ambulatory Visit
